# Patient Record
Sex: Female | Born: 1950 | Race: White | Hispanic: No | Marital: Married | Smoking: Never smoker
Health system: Southern US, Community
[De-identification: ages and names within clinical notes are randomized; demographics above are authoritative.]

## PROBLEM LIST (undated history)

## (undated) DIAGNOSIS — C7A8 Other malignant neuroendocrine tumors: Secondary | ICD-10-CM

## (undated) DIAGNOSIS — K521 Toxic gastroenteritis and colitis: Secondary | ICD-10-CM

## (undated) DIAGNOSIS — R112 Nausea with vomiting, unspecified: Secondary | ICD-10-CM

## (undated) DIAGNOSIS — C801 Malignant (primary) neoplasm, unspecified: Secondary | ICD-10-CM

## (undated) DIAGNOSIS — T451X5A Adverse effect of antineoplastic and immunosuppressive drugs, initial encounter: Secondary | ICD-10-CM

## (undated) DIAGNOSIS — F419 Anxiety disorder, unspecified: Secondary | ICD-10-CM

## (undated) DIAGNOSIS — E669 Obesity, unspecified: Secondary | ICD-10-CM

## (undated) DIAGNOSIS — I10 Essential (primary) hypertension: Secondary | ICD-10-CM

## (undated) DIAGNOSIS — D1771 Benign lipomatous neoplasm of kidney: Secondary | ICD-10-CM

## (undated) DIAGNOSIS — C229 Malignant neoplasm of liver, not specified as primary or secondary: Secondary | ICD-10-CM

## (undated) DIAGNOSIS — M2040 Other hammer toe(s) (acquired), unspecified foot: Secondary | ICD-10-CM

## (undated) DIAGNOSIS — M1990 Unspecified osteoarthritis, unspecified site: Secondary | ICD-10-CM

## (undated) HISTORY — DX: Adverse effect of antineoplastic and immunosuppressive drugs, initial encounter: T45.1X5A

## (undated) HISTORY — DX: Benign lipomatous neoplasm of kidney: D17.71

## (undated) HISTORY — DX: Other hammer toe(s) (acquired), unspecified foot: M20.40

## (undated) HISTORY — DX: Obesity, unspecified: E66.9

## (undated) HISTORY — DX: Adverse effect of antineoplastic and immunosuppressive drugs, initial encounter: K52.1

## (undated) HISTORY — DX: Other malignant neuroendocrine tumors: C7A.8

## (undated) HISTORY — PX: OOPHORECTOMY: SHX86

## (undated) HISTORY — DX: Nausea with vomiting, unspecified: R11.2

## (undated) HISTORY — DX: Essential (primary) hypertension: I10

## (undated) HISTORY — PX: BUNIONECTOMY: SHX129

---

## 1993-06-21 HISTORY — PX: ABDOMINAL HYSTERECTOMY: SHX81

## 2006-06-21 HISTORY — PX: ROTATOR CUFF REPAIR: SHX139

## 2006-09-15 ENCOUNTER — Ambulatory Visit: Payer: Self-pay | Admitting: General Practice

## 2006-11-16 ENCOUNTER — Ambulatory Visit: Payer: Self-pay | Admitting: General Practice

## 2006-11-16 ENCOUNTER — Other Ambulatory Visit: Payer: Self-pay

## 2006-11-30 ENCOUNTER — Ambulatory Visit: Payer: Self-pay | Admitting: General Practice

## 2008-12-30 LAB — HM DEXA SCAN

## 2010-02-27 ENCOUNTER — Ambulatory Visit: Payer: Self-pay | Admitting: General Practice

## 2010-03-20 ENCOUNTER — Ambulatory Visit: Payer: Self-pay | Admitting: General Practice

## 2010-05-02 LAB — HM PAP SMEAR: HM Pap smear: NORMAL

## 2010-05-25 ENCOUNTER — Ambulatory Visit: Payer: Self-pay | Admitting: General Practice

## 2010-09-09 LAB — HM MAMMOGRAPHY: HM Mammogram: NORMAL

## 2010-11-23 ENCOUNTER — Ambulatory Visit: Payer: Self-pay | Admitting: Urology

## 2011-05-19 ENCOUNTER — Ambulatory Visit (INDEPENDENT_AMBULATORY_CARE_PROVIDER_SITE_OTHER): Payer: PRIVATE HEALTH INSURANCE | Admitting: Internal Medicine

## 2011-05-19 ENCOUNTER — Encounter: Payer: Self-pay | Admitting: Internal Medicine

## 2011-05-19 DIAGNOSIS — M949 Disorder of cartilage, unspecified: Secondary | ICD-10-CM

## 2011-05-19 DIAGNOSIS — Z79899 Other long term (current) drug therapy: Secondary | ICD-10-CM

## 2011-05-19 DIAGNOSIS — Z1239 Encounter for other screening for malignant neoplasm of breast: Secondary | ICD-10-CM | POA: Insufficient documentation

## 2011-05-19 DIAGNOSIS — E569 Vitamin deficiency, unspecified: Secondary | ICD-10-CM

## 2011-05-19 DIAGNOSIS — I10 Essential (primary) hypertension: Secondary | ICD-10-CM

## 2011-05-19 DIAGNOSIS — D1771 Benign lipomatous neoplasm of kidney: Secondary | ICD-10-CM | POA: Insufficient documentation

## 2011-05-19 DIAGNOSIS — M8580 Other specified disorders of bone density and structure, unspecified site: Secondary | ICD-10-CM

## 2011-05-19 DIAGNOSIS — E669 Obesity, unspecified: Secondary | ICD-10-CM | POA: Insufficient documentation

## 2011-05-19 DIAGNOSIS — M899 Disorder of bone, unspecified: Secondary | ICD-10-CM

## 2011-05-19 DIAGNOSIS — Z1211 Encounter for screening for malignant neoplasm of colon: Secondary | ICD-10-CM

## 2011-05-19 LAB — COMPREHENSIVE METABOLIC PANEL
ALT: 21 U/L (ref 0–35)
Albumin: 4.4 g/dL (ref 3.5–5.2)
CO2: 27 mEq/L (ref 19–32)
Calcium: 9.6 mg/dL (ref 8.4–10.5)
Chloride: 102 mEq/L (ref 96–112)
GFR: 51.03 mL/min — ABNORMAL LOW (ref >60.00)
Potassium: 3.9 mEq/L (ref 3.5–5.1)
Sodium: 139 mEq/L (ref 135–145)
Total Bilirubin: 0.5 mg/dL (ref 0.3–1.2)
Total Protein: 8.4 g/dL — ABNORMAL HIGH (ref 6.0–8.3)

## 2011-05-19 MED ORDER — OLMESARTAN MEDOXOMIL 20 MG PO TABS: 20.0000 mg | ORAL_TABLET | Freq: Every day | ORAL | Status: DC

## 2011-05-20 ENCOUNTER — Encounter: Payer: Self-pay | Admitting: Internal Medicine

## 2011-05-20 DIAGNOSIS — I10 Essential (primary) hypertension: Secondary | ICD-10-CM | POA: Insufficient documentation

## 2011-05-20 LAB — VITAMIN D 25 HYDROXY (VIT D DEFICIENCY, FRACTURES): Vit D, 25-Hydroxy: 56 ng/mL (ref 30–89)

## 2011-05-20 NOTE — Progress Notes (Signed)
  Subjective:    Patient ID: Kathleen James, female    DOB: [birth year], 60 y.o.   MRN: [id number]  HPI    Review of Systems     Objective:   Physical Exam        Assessment & Plan:    Subjective:    Kathleen James is a 60 y.o. female who presents for an annual exam. The patient has no complaints today. The patient is not sexually active. GYN screening history: last pap: approximate date May 20 2010 and was normal. The patient wears seatbelts: yes. The patient participates in regular exercise: no. Has the patient ever been transfused or tattooed?: no. The patient reports that there is not domestic violence in her life.   Menstrual History: OB History    Grav Para Term Preterm Abortions TAB SAB Ect Mult Living                  Menarche age: *26** No LMP recorded. Patient has had a hysterectomy.    The following portions of the patient's history were reviewed and updated as appropriate: allergies, current medications, past family history, past medical history, past social history, past surgical history and problem list.  Review of Systems A comprehensive review of systems was negative.    Objective:    BP 140/82  Pulse 80  Temp(Src) 98.5 F (36.9 C) (Oral)  Resp 16  Ht 5\' 4"  (1.626 m)  Wt 206 lb 4 oz (93.554 kg)  BMI 35.40 kg/m2  SpO2 99% General appearance: alert, cooperative and appears stated age Head: Normocephalic, without obvious abnormality, atraumatic Eyes: conjunctivae/corneas clear. PERRL, EOM's intact. Fundi benign. Ears: normal TM's and external ear canals both ears Neck: no adenopathy, no carotid bruit, no JVD, supple, symmetrical, trachea midline and thyroid not enlarged, symmetric, no tenderness/mass/nodules Back: symmetric, no curvature. ROM normal. No CVA tenderness. Lungs: clear to auscultation bilaterally Breasts: normal appearance, no masses or tenderness Heart: regular rate and rhythm, S1, S2 normal, no murmur, click, rub or  gallop Abdomen: soft, non-tender; bowel sounds normal; no masses,  no organomegaly Extremities: extremities normal, atraumatic, no cyanosis or edema Pulses: 2+ and symmetric Skin: Skin color, texture, turgor normal. No rashes or lesions Lymph nodes: Cervical, supraclavicular, and axillary nodes normal. Neurologic: Alert and oriented X 3, normal strength and tone. Normal symmetric reflexes. Normal coordination and gait.    Assessment:    Healthy female exam.    Plan:     Breast self exam technique reviewed and patient encouraged to perform self-exam monthly. Dietary diary. Discussed healthy lifestyle modifications. Follow up as needed. Sono-mammogram.

## 2011-06-07 ENCOUNTER — Other Ambulatory Visit: Payer: Self-pay | Admitting: Internal Medicine

## 2011-06-07 ENCOUNTER — Other Ambulatory Visit: Payer: PRIVATE HEALTH INSURANCE

## 2011-06-07 ENCOUNTER — Encounter: Payer: Self-pay | Admitting: *Deleted

## 2011-06-07 DIAGNOSIS — Z1211 Encounter for screening for malignant neoplasm of colon: Secondary | ICD-10-CM

## 2011-06-08 ENCOUNTER — Encounter: Payer: Self-pay | Admitting: Internal Medicine

## 2011-06-09 ENCOUNTER — Encounter: Payer: Self-pay | Admitting: *Deleted

## 2011-08-30 LAB — FECAL OCCULT BLOOD, GUAIAC: Fecal Occult Blood: NEGATIVE

## 2011-08-30 LAB — HM MAMMOGRAPHY: HM Mammogram: NORMAL

## 2011-09-01 ENCOUNTER — Encounter: Payer: PRIVATE HEALTH INSURANCE | Admitting: Internal Medicine

## 2011-10-27 ENCOUNTER — Encounter: Payer: Self-pay | Admitting: Internal Medicine

## 2011-10-27 ENCOUNTER — Ambulatory Visit (INDEPENDENT_AMBULATORY_CARE_PROVIDER_SITE_OTHER): Payer: PRIVATE HEALTH INSURANCE | Admitting: Internal Medicine

## 2011-10-27 VITALS — BP 116/78 | HR 75 | Temp 98.5°F | Resp 14 | Ht 64.5 in | Wt 200.2 lb

## 2011-10-27 DIAGNOSIS — I10 Essential (primary) hypertension: Secondary | ICD-10-CM

## 2011-10-27 DIAGNOSIS — F411 Generalized anxiety disorder: Secondary | ICD-10-CM

## 2011-10-27 DIAGNOSIS — Z1239 Encounter for other screening for malignant neoplasm of breast: Secondary | ICD-10-CM

## 2011-10-27 DIAGNOSIS — F489 Nonpsychotic mental disorder, unspecified: Secondary | ICD-10-CM

## 2011-10-27 DIAGNOSIS — F5105 Insomnia due to other mental disorder: Secondary | ICD-10-CM | POA: Insufficient documentation

## 2011-10-27 DIAGNOSIS — D1771 Benign lipomatous neoplasm of kidney: Secondary | ICD-10-CM

## 2011-10-27 DIAGNOSIS — Z1211 Encounter for screening for malignant neoplasm of colon: Secondary | ICD-10-CM

## 2011-10-27 DIAGNOSIS — Z13220 Encounter for screening for lipoid disorders: Secondary | ICD-10-CM

## 2011-10-27 DIAGNOSIS — F419 Anxiety disorder, unspecified: Secondary | ICD-10-CM

## 2011-10-27 DIAGNOSIS — D175 Benign lipomatous neoplasm of intra-abdominal organs: Secondary | ICD-10-CM

## 2011-10-27 MED ORDER — ALPRAZOLAM 0.25 MG PO TABS: 0.2500 mg | ORAL_TABLET | Freq: Every evening | ORAL | Status: AC | PRN

## 2011-10-27 NOTE — Progress Notes (Signed)
Patient ID: Kathleen James, female   DOB: 12/29/1950, 61 y.o.   MRN:    Patient Active Problem List  Diagnoses  . Screening for breast cancer  . Obesity (BMI 30-39.9)  . Angiolipoma of kidney  . Colon cancer screening  . Hypertension  . Insomnia secondary to anxiety    Subjective:  CC:   Chief Complaint  Patient presents with  . Annual Exam    HPI:   Kathleen James a 61 y.o. female who presents  For follow up on hypertension.  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  He has been disabled for months,  Cannot ambulate, and is receiving home IV infusion.  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  Has occasional early morning wakenings, and is unable to return to sleep for hours.     Past Medical History  Diagnosis Date  . Hypertension   . Obesity (BMI 30-39.9)   . Angiolipoma of kidney     followed with serial CTs ,,  Dr. Achilles Dunk    Past Surgical History  Procedure Date  . Abdominal hysterectomy     endometriosis, heavy bleeding  . Oophorectomy   . Rotator cuff repair 2008    right shoulder          The following portions of the patient's history were reviewed and updated as appropriate: Allergies, current medications, and problem list.    Review of Systems:   12 Pt  review of systems was negative except those addressed in the HPI,     History   Social History  . Marital Status: Married    Spouse Name: N/A    Number of Children: N/A  . Years of Education: N/A   Occupational History  . Not on file.   Social History Main Topics  . Smoking status: Never Smoker   . Smokeless tobacco: Never Used  . Alcohol Use: Yes  . Drug Use: No  . Sexually Active: Not Currently   Other Topics Concern  . Not on file   Social History Narrative  . No narrative on file    Objective:  BP 116/78   Pulse 75  Temp(Src) 98.5 F (36.9 C) (Oral)  Resp 14  Ht 5' 4.5" (1.638 m)  Wt 200 lb 4 oz (90.833 kg)  BMI 33.84 kg/m2  SpO2 98%  General appearance: alert, cooperative and appears stated age Ears: normal TM's and external ear canals both ears Throat: lips, mucosa, and tongue normal; teeth and gums normal Neck: no adenopathy, no carotid bruit, supple, symmetrical, trachea midline and thyroid not enlarged, symmetric, no tenderness/mass/nodules Back: symmetric, no curvature. ROM normal. No CVA tenderness. Lungs: clear to auscultation bilaterally Heart: regular rate and rhythm, S1, S2 normal, no murmur, click, rub or gallop Abdomen: soft, non-tender; bowel sounds normal; no masses,  no organomegaly Pulses: 2+ and symmetric Skin: Skin color, texture, turgor normal. No rashes or lesions Lymph nodes: Cervical, supraclavicular, and axillary nodes normal.  Assessment and Plan:  Insomnia secondary to anxiety Trial of low dose alprazolam  discussed with patient given that her insomnia is due to generalize anxiety over her husband's medical condition. She has been given a prescription for 0.25 mg to use for early awakening as needed. If she finds that she is needing to use this on a daily basis we discussed switching her  to an SSRI to avoid dependence on sedatives for management of anxiety-induced insomnia.  Hypertension Well controlled on current medications.  No changes today. BMET due.  She will return for fasting labs as well,    Screening for breast cancer She had a normal mammogram in March of 2012 at [facility]. Repeat has been scheduled. She had a normal breast exam last visit.  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 Shane Crutch off. There has been no change.  Colon cancer screening At her last visit she was given fecal occult blood test cards to take home and do in lieu of screening colonoscopy. These have not been returned  yet. She was reminded to do these.    Updated Medication List Outpatient Encounter Prescriptions as of 10/27/2011  Medication Sig Dispense Refill  . Calcium Carbonate-Vitamin D (CALCIUM + D) 600-200 MG-UNIT TABS Take by mouth.        Marland Kitchen glucosamine-chondroitin 500-400 MG tablet Take 1 tablet by mouth 3 (three) times daily.        . Multiple Vitamin (MULTIVITAMIN) tablet Take 1 tablet by mouth daily.        Marland Kitchen olmesartan (BENICAR) 20 MG tablet Take 1 tablet (20 mg total) by mouth daily.  90 tablet  3  . ALPRAZolam (XANAX) 0.25 MG tablet Take 1 tablet (0.25 mg total) by mouth at bedtime as needed for sleep.  30 tablet  2     Orders Placed This Encounter  Procedures  . HM MAMMOGRAPHY  . HM DEXA SCAN  . Lipid panel  . COMPLETE METABOLIC PANEL WITH GFR  . HM PAP SMEAR    No Follow-up on file.

## 2011-10-27 NOTE — Assessment & Plan Note (Signed)
Well controlled on current medications.  No changes today. BMET due.  She will return for fasting labs as well,

## 2011-10-27 NOTE — Assessment & Plan Note (Addendum)
Trial of low dose alprazolam  discussed with patient given that her insomnia is due to generalize anxiety over her husband's medical condition. She has been given a prescription for 0.25 mg to use for early awakening as needed. If she finds that she is needing to use this on a daily basis we discussed switching her to an SSRI to avoid dependence on sedatives for management of anxiety-induced insomnia.

## 2011-10-31 NOTE — Assessment & Plan Note (Signed)
At her last visit she was given fecal occult blood test cards to take home and do in lieu of screening colonoscopy. These have not been returned yet. She was reminded to do these.

## 2011-10-31 NOTE — Assessment & Plan Note (Signed)
Her angioma lipoma is being followed with serial ultrasounds by Dr. Shane Crutch off. There has been no change.

## 2011-10-31 NOTE — Assessment & Plan Note (Signed)
She had a normal mammogram in March of 2012 at [facility]. Repeat has been scheduled. She had a normal breast exam last visit.

## 2011-11-02 ENCOUNTER — Other Ambulatory Visit (INDEPENDENT_AMBULATORY_CARE_PROVIDER_SITE_OTHER): Payer: PRIVATE HEALTH INSURANCE | Admitting: *Deleted

## 2011-11-02 DIAGNOSIS — I10 Essential (primary) hypertension: Secondary | ICD-10-CM

## 2011-11-02 DIAGNOSIS — Z13220 Encounter for screening for lipoid disorders: Secondary | ICD-10-CM

## 2011-11-02 LAB — COMPREHENSIVE METABOLIC PANEL
Albumin: 3.9 g/dL (ref 3.5–5.2)
Alkaline Phosphatase: 64 U/L (ref 39–117)
BUN: 17 mg/dL (ref 6–23)
CO2: 25 mEq/L (ref 19–32)
GFR: 57.87 mL/min — ABNORMAL LOW (ref >60.00)
Glucose, Bld: 85 mg/dL (ref 70–99)
Potassium: 4.2 mEq/L (ref 3.5–5.1)
Sodium: 141 mEq/L (ref 135–145)
Total Protein: 7.9 g/dL (ref 6.0–8.3)

## 2011-11-02 LAB — LIPID PANEL: Cholesterol: 149 mg/dL (ref 0–200)

## 2011-12-08 ENCOUNTER — Ambulatory Visit: Payer: Self-pay | Admitting: Urology

## 2012-06-06 ENCOUNTER — Other Ambulatory Visit: Payer: Self-pay

## 2012-06-06 DIAGNOSIS — I10 Essential (primary) hypertension: Secondary | ICD-10-CM

## 2012-06-06 MED ORDER — OLMESARTAN MEDOXOMIL 20 MG PO TABS: 20.0000 mg | ORAL_TABLET | Freq: Every day | ORAL | Status: DC

## 2012-06-06 NOTE — Telephone Encounter (Signed)
Refill request for Benicar 20 mg # 90 0 R  Sent to [facility] . Advised patient needs appt for further refills.

## 2012-09-20 ENCOUNTER — Ambulatory Visit: Payer: PRIVATE HEALTH INSURANCE | Admitting: Internal Medicine

## 2012-09-29 ENCOUNTER — Ambulatory Visit (INDEPENDENT_AMBULATORY_CARE_PROVIDER_SITE_OTHER): Payer: PRIVATE HEALTH INSURANCE | Admitting: Internal Medicine

## 2012-09-29 ENCOUNTER — Encounter: Payer: Self-pay | Admitting: Internal Medicine

## 2012-09-29 VITALS — BP 142/82 | HR 87 | Temp 98.0°F | Resp 16 | Wt 200.8 lb

## 2012-09-29 DIAGNOSIS — Z1239 Encounter for other screening for malignant neoplasm of breast: Secondary | ICD-10-CM

## 2012-09-29 DIAGNOSIS — R5381 Other malaise: Secondary | ICD-10-CM

## 2012-09-29 DIAGNOSIS — J309 Allergic rhinitis, unspecified: Secondary | ICD-10-CM

## 2012-09-29 DIAGNOSIS — Z1211 Encounter for screening for malignant neoplasm of colon: Secondary | ICD-10-CM

## 2012-09-29 DIAGNOSIS — Z79899 Other long term (current) drug therapy: Secondary | ICD-10-CM

## 2012-09-29 DIAGNOSIS — I10 Essential (primary) hypertension: Secondary | ICD-10-CM

## 2012-09-29 DIAGNOSIS — E785 Hyperlipidemia, unspecified: Secondary | ICD-10-CM

## 2012-09-29 MED ORDER — LEVOFLOXACIN 500 MG PO TABS: 500.0000 mg | ORAL_TABLET | Freq: Every day | ORAL | Status: DC

## 2012-09-29 NOTE — Patient Instructions (Addendum)
Try taking Allegra, claritin or zyrtec  For allergies  Add simply saline flush twice daily to flush the pollen  Try sudafed pe  Up to 30 mg every 6 hours and benadryl if drainage not controlled with allegra, etc/   Start abx only if fever (T > 100.4) ,  Facial pain,  Or bloody nasal drainage  Check bp once a week after you finish using a decongestant (sudafed ) to see if < 130/80  Fasting labs day of annual PE

## 2012-09-29 NOTE — Progress Notes (Signed)
Patient ID: Kathleen James, female   DOB: Feb 05, 1951, 62 y.o.   MRN: [id number]  Patient Active Problem List  Diagnosis  . Screening for breast cancer  . Obesity (BMI 30-39.9)  . Angiolipoma of kidney  . Colon cancer screening  . Hypertension  . Insomnia secondary to anxiety  . Allergic rhinitis    Subjective:  CC:   Chief Complaint  Patient presents with  . Follow-up    for script refills     HPI:   Kathleen James a 62 y.o. female who presents for follow up on hypertension.  She has been lost to follow up and refills on medications were denied until she was reevaluated.  Her cc today is persistent allergic rhinitis. Her sinuses have felt congested and accompanied by a  frontal headache for two days which has now resolved,  Her sinus drainage has been colored, yellow to green, but she denies fevers and sinus tendernes.    Past Medical History  Diagnosis Date  . Hypertension   . Obesity (BMI 30-39.9)   . Angiolipoma of kidney     followed with serial CTs ,,  Dr. Achilles Dunk    Past Surgical History  Procedure Laterality Date  . Abdominal hysterectomy      endometriosis, heavy bleeding  . Oophorectomy    . Rotator cuff repair  2008    right shoulder     The following portions of the patient's history were reviewed and updated as appropriate: Allergies, current medications, and problem list.    Review of Systems:   Patient denies headache, fevers, malaise, unintentional weight loss, skin rash, eye pain, sinus congestion and sinus pain, sore throat, dysphagia,  hemoptysis , cough, dyspnea, wheezing, chest pain, palpitations, orthopnea, edema, abdominal pain, nausea, melena, diarrhea, constipation, flank pain, dysuria, hematuria, urinary  Frequency, nocturia, numbness, tingling, seizures,  Focal weakness, Loss of consciousness,  Tremor, insomnia, depression, anxiety, and suicidal ideation.       History   Social History  . Marital Status: Married    Spouse Name: N/A     Number of Children: N/A  . Years of Education: N/A   Occupational History  . Not on file.   Social History Main Topics  . Smoking status: Never Smoker   . Smokeless tobacco: Never Used  . Alcohol Use: Yes  . Drug Use: No  . Sexually Active: Not Currently   Other Topics Concern  . Not on file   Social History Narrative  . No narrative on file    Objective:  BP 142/82  Pulse 87  Temp(Src) 98 F (36.7 C) (Oral)  Resp 16  Wt 200 lb 12 oz (91.06 kg)  BMI 33.94 kg/m2  SpO2 98%  General appearance: alert, cooperative and appears stated age Ears: normal TM's and external ear canals both ears Throat: lips, mucosa, and tongue normal; teeth and gums normal Neck: no adenopathy, no carotid bruit, supple, symmetrical, trachea midline and thyroid not enlarged, symmetric, no tenderness/mass/nodules Back: symmetric, no curvature. ROM normal. No CVA tenderness. Lungs: clear to auscultation bilaterally Heart: regular rate and rhythm, S1, S2 normal, no murmur, click, rub or gallop Abdomen: soft, non-tender; bowel sounds normal; no masses,  no organomegaly Pulses: 2+ and symmetric Skin: Skin color, texture, turgor normal. No rashes or lesions Lymph nodes: Cervical, supraclavicular, and axillary nodes normal.  Assessment and Plan:  Screening for breast cancer She is due for repeat mammogram and this has been ordered.  Return for annual exam.  Hypertension Well controlled  on current regimen. Renal function due, no changes today.  Colon cancer screening Per patient preference,  Annual FOBTS.    Updated Medication List Outpatient Encounter Prescriptions as of 09/29/2012  Medication Sig Dispense Refill  . aspirin 81 MG tablet Take 81 mg by mouth daily.      . Calcium Carbonate-Vitamin D (CALCIUM + D) 600-200 MG-UNIT TABS Take by mouth.        . fish oil-omega-3 fatty acids 1000 MG capsule Take 600 mg by mouth daily.      Marland Kitchen glucosamine-chondroitin 500-400 MG tablet Take 1 tablet  by mouth 3 (three) times daily.        Marland Kitchen olmesartan (BENICAR) 20 MG tablet Take 1 tablet (20 mg total) by mouth daily.  90 tablet  0  . levofloxacin (LEVAQUIN) 500 MG tablet Take 1 tablet (500 mg total) by mouth daily.  7 tablet  0  . Multiple Vitamin (MULTIVITAMIN) tablet Take 1 tablet by mouth daily.         No facility-administered encounter medications on file as of 09/29/2012.     Orders Placed This Encounter  Procedures  . HM MAMMOGRAPHY  . MM Digital Screening  . Fecal Occult Blood, Guaiac  . CBC with Differential  . Comprehensive metabolic panel  . Lipid panel  . TSH  . POC Hemoccult Bld/Stl (3-Cd Home Screen)    No Follow-up on file.

## 2012-09-30 ENCOUNTER — Encounter: Payer: Self-pay | Admitting: Internal Medicine

## 2012-09-30 DIAGNOSIS — J309 Allergic rhinitis, unspecified: Secondary | ICD-10-CM | POA: Insufficient documentation

## 2012-09-30 NOTE — Assessment & Plan Note (Signed)
Well controlled on current regimen. Renal function due.,   no changes today. 

## 2012-09-30 NOTE — Assessment & Plan Note (Signed)
She is due for repeat mammogram and this has been ordered.  Return for annual exam.

## 2012-09-30 NOTE — Assessment & Plan Note (Signed)
Per patient preference,  Annual FOBTS.

## 2012-10-04 ENCOUNTER — Other Ambulatory Visit: Payer: PRIVATE HEALTH INSURANCE

## 2012-10-05 ENCOUNTER — Other Ambulatory Visit: Payer: Self-pay | Admitting: Internal Medicine

## 2012-10-05 ENCOUNTER — Other Ambulatory Visit: Payer: PRIVATE HEALTH INSURANCE

## 2012-10-05 DIAGNOSIS — Z1211 Encounter for screening for malignant neoplasm of colon: Secondary | ICD-10-CM

## 2012-10-09 ENCOUNTER — Encounter: Payer: Self-pay | Admitting: *Deleted

## 2012-10-10 ENCOUNTER — Telehealth: Payer: Self-pay | Admitting: *Deleted

## 2012-10-10 MED ORDER — ALPRAZOLAM 0.25 MG PO TABS: 0.2500 mg | ORAL_TABLET | Freq: Every day | ORAL | Status: DC | PRN

## 2012-10-10 NOTE — Telephone Encounter (Signed)
Ok to refill,  Authorized in epic 

## 2012-10-10 NOTE — Telephone Encounter (Signed)
Refill request  Alprazolam 0.25 mg tab  #30  Take one tablet by mouth at bedtime as needed

## 2012-10-11 NOTE — Telephone Encounter (Signed)
Called to pharmacy 

## 2012-11-24 ENCOUNTER — Encounter: Payer: Self-pay | Admitting: Internal Medicine

## 2012-12-07 ENCOUNTER — Other Ambulatory Visit: Payer: Self-pay | Admitting: *Deleted

## 2012-12-07 DIAGNOSIS — I10 Essential (primary) hypertension: Secondary | ICD-10-CM

## 2012-12-07 MED ORDER — OLMESARTAN MEDOXOMIL 20 MG PO TABS: 20.0000 mg | ORAL_TABLET | Freq: Every day | ORAL | Status: DC

## 2013-01-10 ENCOUNTER — Encounter: Payer: Self-pay | Admitting: Internal Medicine

## 2013-01-10 ENCOUNTER — Ambulatory Visit (INDEPENDENT_AMBULATORY_CARE_PROVIDER_SITE_OTHER): Payer: PRIVATE HEALTH INSURANCE | Admitting: Internal Medicine

## 2013-01-10 VITALS — BP 130/90 | HR 79 | Temp 98.4°F | Resp 14 | Ht 65.0 in | Wt 205.5 lb

## 2013-01-10 DIAGNOSIS — R5381 Other malaise: Secondary | ICD-10-CM

## 2013-01-10 DIAGNOSIS — F419 Anxiety disorder, unspecified: Secondary | ICD-10-CM

## 2013-01-10 DIAGNOSIS — E669 Obesity, unspecified: Secondary | ICD-10-CM

## 2013-01-10 DIAGNOSIS — Z79899 Other long term (current) drug therapy: Secondary | ICD-10-CM

## 2013-01-10 DIAGNOSIS — Z1211 Encounter for screening for malignant neoplasm of colon: Secondary | ICD-10-CM

## 2013-01-10 DIAGNOSIS — Z0000 Encounter for general adult medical examination without abnormal findings: Secondary | ICD-10-CM

## 2013-01-10 DIAGNOSIS — F5105 Insomnia due to other mental disorder: Secondary | ICD-10-CM

## 2013-01-10 DIAGNOSIS — I10 Essential (primary) hypertension: Secondary | ICD-10-CM

## 2013-01-10 DIAGNOSIS — Z23 Encounter for immunization: Secondary | ICD-10-CM

## 2013-01-10 DIAGNOSIS — Z1239 Encounter for other screening for malignant neoplasm of breast: Secondary | ICD-10-CM

## 2013-01-10 DIAGNOSIS — Z13220 Encounter for screening for lipoid disorders: Secondary | ICD-10-CM

## 2013-01-10 DIAGNOSIS — D175 Benign lipomatous neoplasm of intra-abdominal organs: Secondary | ICD-10-CM

## 2013-01-10 DIAGNOSIS — R5383 Other fatigue: Secondary | ICD-10-CM

## 2013-01-10 DIAGNOSIS — F411 Generalized anxiety disorder: Secondary | ICD-10-CM

## 2013-01-10 DIAGNOSIS — D1771 Benign lipomatous neoplasm of kidney: Secondary | ICD-10-CM

## 2013-01-10 DIAGNOSIS — F489 Nonpsychotic mental disorder, unspecified: Secondary | ICD-10-CM

## 2013-01-10 LAB — CBC WITH DIFFERENTIAL/PLATELET
Basophils Relative: 0.5 % (ref 0.0–3.0)
Eosinophils Relative: 3.1 % (ref 0.0–5.0)
MCV: 97.4 fl (ref 78.0–100.0)
Monocytes Absolute: 0.2 10*3/uL (ref 0.1–1.0)
Monocytes Relative: 5 % (ref 3.0–12.0)
Neutrophils Relative %: 61.4 % (ref 43.0–77.0)
RBC: 4.02 Mil/uL (ref 3.87–5.11)
WBC: 4.6 10*3/uL (ref 4.5–10.5)

## 2013-01-10 LAB — COMPREHENSIVE METABOLIC PANEL
Albumin: 4.2 g/dL (ref 3.5–5.2)
Alkaline Phosphatase: 56 U/L (ref 39–117)
BUN: 20 mg/dL (ref 6–23)
Glucose, Bld: 96 mg/dL (ref 70–99)
Total Bilirubin: 0.5 mg/dL (ref 0.3–1.2)

## 2013-01-10 LAB — LIPID PANEL
HDL: 61 mg/dL (ref >39.00)
LDL Cholesterol: 85 mg/dL (ref 0–99)
Total CHOL/HDL Ratio: 3
Triglycerides: 105 mg/dL (ref 0.0–149.0)
VLDL: 21 mg/dL (ref 0.0–40.0)

## 2013-01-10 NOTE — Assessment & Plan Note (Addendum)
She has had serial Ultrasounds which have been normal now for 3 years. Her last evaluation was last month and she follows up with Dr. cope.,

## 2013-01-10 NOTE — Patient Instructions (Addendum)
You had your annual wellness exam today  You received  a TDaP vaccine and  I will give you the rx for the Shingles vaccine.    We will contact you with the bloodwork results

## 2013-01-10 NOTE — Progress Notes (Signed)
Patient ID: Kathleen James, female   DOB: 05-06-1951, 62 y.o.   MRN: [id number]  Subjective:     Kathleen James is a 62 y.o. female and is here for a comprehensive physical exam. The patient reports no problems.  History   Social History  . Marital Status: Married    Spouse Name: N/A    Number of Children: N/A  . Years of Education: N/A   Occupational History  . Not on file.   Social History Main Topics  . Smoking status: Never Smoker   . Smokeless tobacco: Never Used  . Alcohol Use: Yes  . Drug Use: No  . Sexually Active: Not Currently   Other Topics Concern  . Not on file   Social History Narrative  . No narrative on file   Health Maintenance  Topic Date Due  . Colonoscopy  08/29/2000  . Zostavax  08/30/2010  . Influenza Vaccine  02/19/2013  . Pap Smear  05/20/2013  . Mammogram  11/11/2014  . Tetanus/tdap  01/11/2023    The following portions of the patient's history were reviewed and updated as appropriate: allergies, current medications, past family history, past medical history, past social history, past surgical history and problem list.  Review of Systems A comprehensive review of systems was negative.   Objective:   BP 130/90  Pulse 79  Temp(Src) 98.4 F (36.9 C) (Oral)  Resp 14  Ht 5\' 5"  (1.651 m)  Wt 205 lb 8 oz (93.214 kg)  BMI 34.2 kg/m2  SpO2 98%  General Appearance:    Alert, cooperative, no distress, appears stated age  Head:    Normocephalic, without obvious abnormality, atraumatic  Eyes:    PERRL, conjunctiva/corneas clear, EOM's intact, fundi    benign, both eyes  Ears:    Normal TM's and external ear canals, both ears  Nose:   Nares normal, septum midline, mucosa normal, no drainage    or sinus tenderness  Throat:   Lips, mucosa, and tongue normal; teeth and gums normal  Neck:   Supple, symmetrical, trachea midline, no adenopathy;    thyroid:  no enlargement/tenderness/nodules; no carotid   bruit or JVD  Back:     Symmetric, no  curvature, ROM normal, no CVA tenderness  Lungs:     Clear to auscultation bilaterally, respirations unlabored  Chest Wall:    No tenderness or deformity   Heart:    Regular rate and rhythm, S1 and S2 normal, no murmur, rub   or gallop  Breast Exam:    No tenderness, masses, or nipple abnormality  Abdomen:     Soft, non-tender, bowel sounds active all four quadrants,    no masses, no organomegaly  Genitalia:    Pelvic: vagina normal without discharge  Extremities:   Extremities normal, atraumatic, no cyanosis or edema  Pulses:   2+ and symmetric all extremities  Skin:   Skin color, texture, turgor normal, no rashes or lesions  Lymph nodes:   Cervical, supraclavicular, and axillary nodes normal  Neurologic:   CNII-XII intact, normal strength, sensation and reflexes    throughout    Assessment and Plan:  Angiolipoma of kidney She has had serial Ultrasounds which have been normal now for 3 years. Her last evaluation was last month and she follows up with Dr. cope.,    Colon cancer screening She prefers to continue annual fecal occult blood testing rather than repeat your colonoscopy. This was normal in April.  Screening for breast cancer She is up-to-date on  mammograms and have been normal.  Obesity (BMI 30-39.9) I have addressed  BMI and recommended a low glycemic index diet utilizing smaller more frequent meals to increase metabolism.  I have also recommended that patient start exercising with a goal of 30 minutes of aerobic exercise a minimum of 5 days per week. Screening for lipid disorders, thyroid and diabetes today and there were no signs of metabolic syndrome.    Hypertension Well controlled on current regimen. Renal function stable, no changes today.  Insomnia secondary to anxiety She has been having early awakening at 3:00 in the morning. Trial of very low-dose alprazolam to help her go back to sleep.  Routine general medical examination at a health care facility Annual  comprehensive exam was done including breast, pelvic exams.  All screenings have been addressed .    Updated Medication List Outpatient Encounter Prescriptions as of 01/10/2013  Medication Sig Dispense Refill  . ALPRAZolam (XANAX) 0.25 MG tablet Take 1 tablet (0.25 mg total) by mouth daily as needed for sleep or anxiety.  30 tablet  3  . aspirin 81 MG tablet Take 81 mg by mouth daily.      . Calcium Carbonate-Vitamin D (CALCIUM + D) 600-200 MG-UNIT TABS Take by mouth.        . fish oil-omega-3 fatty acids 1000 MG capsule Take 600 mg by mouth daily.      Marland Kitchen glucosamine-chondroitin 500-400 MG tablet Take 1 tablet by mouth 3 (three) times daily.        . Multiple Vitamin (MULTIVITAMIN) tablet Take 1 tablet by mouth daily.        Marland Kitchen olmesartan (BENICAR) 20 MG tablet Take 1 tablet (20 mg total) by mouth daily.  90 tablet  1  . [DISCONTINUED] levofloxacin (LEVAQUIN) 500 MG tablet Take 1 tablet (500 mg total) by mouth daily.  7 tablet  0   No facility-administered encounter medications on file as of 01/10/2013.

## 2013-01-11 ENCOUNTER — Encounter: Payer: Self-pay | Admitting: Internal Medicine

## 2013-01-11 DIAGNOSIS — Z0000 Encounter for general adult medical examination without abnormal findings: Secondary | ICD-10-CM | POA: Insufficient documentation

## 2013-01-11 NOTE — Assessment & Plan Note (Signed)
She has been having early awakening at 3:00 in the morning. Trial of very low-dose alprazolam to help her go back to sleep.

## 2013-01-11 NOTE — Assessment & Plan Note (Signed)
She is up-to-date on mammograms and have been normal.

## 2013-01-11 NOTE — Assessment & Plan Note (Addendum)
I have addressed  BMI and recommended a low glycemic index diet utilizing smaller more frequent meals to increase metabolism.  I have also recommended that patient start exercising with a goal of 30 minutes of aerobic exercise a minimum of 5 days per week. Screening for lipid disorders, thyroid and diabetes today and there were no signs of metabolic syndrome.

## 2013-01-11 NOTE — Assessment & Plan Note (Signed)
Annual comprehensive exam was done including breast, pelvic exams.  All screenings have been addressed .

## 2013-01-11 NOTE — Assessment & Plan Note (Signed)
She prefers to continue annual fecal occult blood testing rather than repeat your colonoscopy. This was normal in April.

## 2013-01-11 NOTE — Assessment & Plan Note (Signed)
Well controlled on current regimen. Renal function stable, no changes today. 

## 2013-01-14 ENCOUNTER — Encounter: Payer: Self-pay | Admitting: Internal Medicine

## 2013-01-29 ENCOUNTER — Encounter: Payer: Self-pay | Admitting: Internal Medicine

## 2013-04-26 ENCOUNTER — Other Ambulatory Visit: Payer: Self-pay

## 2013-06-06 ENCOUNTER — Other Ambulatory Visit: Payer: Self-pay | Admitting: Internal Medicine

## 2013-12-13 ENCOUNTER — Other Ambulatory Visit: Payer: Self-pay | Admitting: Internal Medicine

## 2014-02-19 ENCOUNTER — Other Ambulatory Visit: Payer: Self-pay | Admitting: Internal Medicine

## 2014-02-19 NOTE — Telephone Encounter (Signed)
Appt scheduled 03/25/14

## 2014-03-25 ENCOUNTER — Encounter: Payer: PRIVATE HEALTH INSURANCE | Admitting: Internal Medicine

## 2014-04-09 ENCOUNTER — Encounter: Payer: PRIVATE HEALTH INSURANCE | Admitting: Internal Medicine

## 2014-04-22 ENCOUNTER — Other Ambulatory Visit: Payer: Self-pay

## 2014-04-22 DIAGNOSIS — E785 Hyperlipidemia, unspecified: Secondary | ICD-10-CM

## 2014-04-22 DIAGNOSIS — E559 Vitamin D deficiency, unspecified: Secondary | ICD-10-CM

## 2014-04-22 DIAGNOSIS — R5383 Other fatigue: Secondary | ICD-10-CM

## 2014-04-22 NOTE — Telephone Encounter (Signed)
Appt sch 06/07/14. Last visit July 2014. Last labs July 2014. Ok refill and you want any labs prior to?

## 2014-04-22 NOTE — Telephone Encounter (Signed)
The pt called and is needing refills on her blood pressure meds (benicar)  [place]

## 2014-04-24 MED ORDER — OLMESARTAN MEDOXOMIL 20 MG PO TABS: 20.0000 mg | ORAL_TABLET | Freq: Every day | ORAL | Status: DC

## 2014-04-24 NOTE — Telephone Encounter (Signed)
Left message, notifying pt. Requested call back to schedule fasting lab appointment.

## 2014-04-24 NOTE — Telephone Encounter (Signed)
Ok to Refill for 30 days and must have fasting labs done  ASAP  prior to any more refill

## 2014-05-02 ENCOUNTER — Other Ambulatory Visit (INDEPENDENT_AMBULATORY_CARE_PROVIDER_SITE_OTHER): Payer: PRIVATE HEALTH INSURANCE

## 2014-05-02 DIAGNOSIS — R5383 Other fatigue: Secondary | ICD-10-CM

## 2014-05-02 DIAGNOSIS — E785 Hyperlipidemia, unspecified: Secondary | ICD-10-CM

## 2014-05-02 DIAGNOSIS — E559 Vitamin D deficiency, unspecified: Secondary | ICD-10-CM

## 2014-05-02 LAB — LIPID PANEL
CHOL/HDL RATIO: 4
CHOLESTEROL: 179 mg/dL (ref 0–200)
HDL: 48.6 mg/dL (ref >39.00)
LDL CALC: 112 mg/dL — AB (ref 0–99)
NonHDL: 130.4
TRIGLYCERIDES: 93 mg/dL (ref 0.0–149.0)
VLDL: 18.6 mg/dL (ref 0.0–40.0)

## 2014-05-02 LAB — COMPREHENSIVE METABOLIC PANEL
ALBUMIN: 3.5 g/dL (ref 3.5–5.2)
ALT: 23 U/L (ref 0–35)
AST: 23 U/L (ref 0–37)
Alkaline Phosphatase: 71 U/L (ref 39–117)
BUN: 14 mg/dL (ref 6–23)
CALCIUM: 9.5 mg/dL (ref 8.4–10.5)
CO2: 21 mEq/L (ref 19–32)
CREATININE: 1.1 mg/dL (ref 0.4–1.2)
Chloride: 108 mEq/L (ref 96–112)
GFR: 56.14 mL/min — ABNORMAL LOW (ref >60.00)
GLUCOSE: 100 mg/dL — AB (ref 70–99)
POTASSIUM: 4.3 meq/L (ref 3.5–5.1)
Sodium: 140 mEq/L (ref 135–145)
Total Bilirubin: 0.7 mg/dL (ref 0.2–1.2)
Total Protein: 8 g/dL (ref 6.0–8.3)

## 2014-05-02 LAB — CBC WITH DIFFERENTIAL/PLATELET
BASOS PCT: 0.6 % (ref 0.0–3.0)
Basophils Absolute: 0 10*3/uL (ref 0.0–0.1)
EOS PCT: 1.9 % (ref 0.0–5.0)
Eosinophils Absolute: 0.1 10*3/uL (ref 0.0–0.7)
HCT: 40.2 % (ref 36.0–46.0)
HEMOGLOBIN: 13.7 g/dL (ref 12.0–15.0)
LYMPHS PCT: 22.4 % (ref 12.0–46.0)
Lymphs Abs: 1.3 10*3/uL (ref 0.7–4.0)
MCHC: 34 g/dL (ref 30.0–36.0)
MCV: 96.8 fl (ref 78.0–100.0)
Monocytes Absolute: 0.3 10*3/uL (ref 0.1–1.0)
Monocytes Relative: 5.4 % (ref 3.0–12.0)
NEUTROS ABS: 4.1 10*3/uL (ref 1.4–7.7)
Neutrophils Relative %: 69.7 % (ref 43.0–77.0)
Platelets: 328 10*3/uL (ref 150.0–400.0)
RBC: 4.16 Mil/uL (ref 3.87–5.11)
RDW: 13.4 % (ref 11.5–15.5)
WBC: 5.9 10*3/uL (ref 4.0–10.5)

## 2014-05-02 LAB — TSH: TSH: 2.16 u[IU]/mL (ref 0.35–4.50)

## 2014-05-02 LAB — VITAMIN D 25 HYDROXY (VIT D DEFICIENCY, FRACTURES): VITD: 36.6 ng/mL (ref 30.00–100.00)

## 2014-05-05 ENCOUNTER — Encounter: Payer: Self-pay | Admitting: Internal Medicine

## 2014-05-21 ENCOUNTER — Encounter: Payer: PRIVATE HEALTH INSURANCE | Admitting: Internal Medicine

## 2014-06-07 ENCOUNTER — Encounter: Payer: Self-pay | Admitting: Internal Medicine

## 2014-06-07 ENCOUNTER — Ambulatory Visit (INDEPENDENT_AMBULATORY_CARE_PROVIDER_SITE_OTHER): Payer: PRIVATE HEALTH INSURANCE | Admitting: Internal Medicine

## 2014-06-07 VITALS — BP 124/88 | HR 77 | Temp 98.4°F | Resp 14 | Ht 64.5 in | Wt 204.2 lb

## 2014-06-07 DIAGNOSIS — E669 Obesity, unspecified: Secondary | ICD-10-CM

## 2014-06-07 DIAGNOSIS — D1771 Benign lipomatous neoplasm of kidney: Secondary | ICD-10-CM

## 2014-06-07 DIAGNOSIS — Z1239 Encounter for other screening for malignant neoplasm of breast: Secondary | ICD-10-CM

## 2014-06-07 DIAGNOSIS — D175 Benign lipomatous neoplasm of intra-abdominal organs: Secondary | ICD-10-CM

## 2014-06-07 DIAGNOSIS — I10 Essential (primary) hypertension: Secondary | ICD-10-CM

## 2014-06-07 DIAGNOSIS — Z0000 Encounter for general adult medical examination without abnormal findings: Secondary | ICD-10-CM

## 2014-06-07 LAB — MICROALBUMIN / CREATININE URINE RATIO
CREATININE, U: 46 mg/dL
MICROALB/CREAT RATIO: 0.4 mg/g (ref 0.0–30.0)
Microalb, Ur: 0.2 mg/dL (ref 0.0–1.9)

## 2014-06-07 NOTE — Progress Notes (Signed)
Pre visit review using our clinic review tool, if applicable. No additional management support is needed unless otherwise documented below in the visit note. 

## 2014-06-07 NOTE — Patient Instructions (Signed)
I want you to try to  lose 20 lbs over the next six months with a low glycemic index diet and regular exercise (30 minutes of cardio 5 days per week is your goal)  This is  my version of a  "Low GI"  Diet:  It will still lower your blood sugars and allow you to lose 4 to 8  lbs  per month if you follow it carefully.  Your goal with exercise is a minimum of 30 minutes of aerobic exercise 5 days per week (Walking does not count once it becomes easy!)     All of the foods can be found at grocery stores and in bulk at [employer].  The Atkins protein bars and shakes are available in more varieties at [employer], [employer] and [place].     7 AM Breakfast:  Choose from the following:  Low carbohydrate Protein  Shakes (I recommend the EAS AdvantEdge "Carb Control" shakes  Or the low carb shakes by Atkins.    2.5 carbs   Arnold's "Sandwhich Thin"toasted  w/ peanut butter (no jelly: about 20 net carbs  "Bagel Thin" with cream cheese and salmon: about 20 carbs   a scrambled egg/bacon/cheese burrito made with Mission's "carb balance" whole wheat tortilla  (about 10 net carbs )  A slice of home made fritatta (egg based dish without a crust:  google it)    Avoid cereal and bananas, oatmeal and cream of wheat and grits. They are loaded with carbohydrates!   10 AM: high protein snack  Protein bar by Atkins (the snack size, under 200 cal, usually < 6 net carbs).    A stick of cheese:  Around 1 carb,  100 cal     Dannon Light n Fit [place] Yogurt  (80 cal, 8 carbs)  Other so called "protein bars" and Greek yogurts tend to be loaded with carbohydrates.  Remember, in food advertising, the word "energy" is synonymous for " carbohydrate."  Lunch:   A Sandwich using the bread choices listed, Can use any  Eggs,  lunchmeat, grilled meat or canned tuna), avocado, regular mayo/mustard  and cheese.  A Salad using blue cheese, ranch,  Goddess or vinagrette,  No croutons or "confetti" and no "candied nuts" but regular  nuts OK.   No pretzels or chips.  Pickles and miniature sweet peppers are a good low carb alternative that provide a "crunch"  The bread is the only source of carbohydrate in a sandwich and  can be decreased by trying some of these alternatives to traditional loaf bread  Joseph's makes a pita bread and a flat bread that are 50 cal and 4 net carbs available at [place] [employer] [place].  This can be toasted to use with hummous as well  Toufayan makes a low carb flatbread that's 100 cal and 9 net carbs available at [employer] and [employer]J's makes 2 sizes of  Low carb whole wheat tortilla  (The large one is 210 cal and 6 net carbs)  Flat Out makes flatbreads that are low carb as well  Avoid "Low fat dressings, as well as Barry Brunner and [place] dressings They are loaded with sugar!   3 PM/ Mid day  Snack:  Consider  1 ounce of  almonds, walnuts, pistachios, pecans, peanuts,  Macadamia nuts or a nut medley.  Avoid "granola"; the dried cranberries and raisins are loaded with carbohydrates. Mixed nuts as long as there are no raisins,  cranberries or  dried fruit.    Try the prosciutto/mozzarella cheese sticks by Fiorruci  In deli /backery section   High protein   To avoid overindulging in snacks: Try drinking a glass of unsweeted almond/coconut milk  Or a cup of coffee with your Atkins chocolate bar to keep you from having 3!!!   Pork rinds!  Yes Pork Rinds        6 PM  Dinner:     Meat/fowl/fish with a green salad, and either broccoli, cauliflower, green beans, spinach, brussel sprouts or  Lima beans. DO NOT BREAD THE PROTEIN!!      There is a low carb pasta by Dreamfield's that is acceptable and tastes great: only 5 digestible carbs/serving.( All grocery stores but BJs carry it )  Try Hurley Cisco Angelo's chicken piccata or chicken or eggplant parm over low carb pasta.(Lowes and BJs)   Marjory Lies Sanchez's "Carnitas" (pulled pork, no sauce,  0 carbs) or his beef pot roast to make a dinner burrito (at  BJ's)  Pesto over low carb pasta (bj's sells a good quality pesto in the center refrigerated section of the deli   Try satueeing  Cheral Marker with mushroooms  Whole wheat pasta is still full of digestible carbs and  Not as low in glycemic index as Dreamfield's.   Brown rice is still rice,  So skip the rice and noodles if you eat [place] or [place] (or at least limit to 1/2 cup)  9 PM snack :   Breyer's "low carb" fudgsicle or  ice cream bar (Carb Smart line), or  Weight Watcher's ice cream bar , or another "no sugar added" ice cream;  a serving of fresh berries/cherries with whipped cream   Cheese or DANNON'S LlGHT N FIT GREEK YOGURT or the [place] greek yogurt   8 ounces of [place] unsweetened almond/cococunut milk  Cheese and crackers (using WASA crackers,  They are low carb) or peanut butter on low carb crackers or pita bread     Avoid bananas, pineapple, grapes  and watermelon on a regular basis because they are high in sugar.  THINK OF THEM AS DESSERT  Remember that snack Substitutions should be less than 10 NET carbs per serving and meals should be < 25 net carbs. Remember that carbohydrates from fiber do not affect blood sugar, so you can  subtract fiber grams to get the "net carbs " of any particular food item.   Health Maintenance Adopting a healthy lifestyle and getting preventive care can go a long way to promote health and wellness. Talk with your health care provider about what schedule of regular examinations is right for you. This is a good chance for you to check in with your provider about disease prevention and staying healthy. In between checkups, there are plenty of things you can do on your own. Experts have done a lot of research about which lifestyle changes and preventive measures are most likely to keep you healthy. Ask your health care provider for more information. WEIGHT AND DIET  Eat a healthy diet  Be sure to include plenty of vegetables, fruits, low-fat dairy  products, and lean protein.  Do not eat a lot of foods high in solid fats, added sugars, or salt.  Get regular exercise. This is one of the most important things you can do for your health.  Most adults should exercise for at least 150 minutes each week. The exercise should increase your heart rate and make you sweat (moderate-intensity exercise).  Most  adults should also do strengthening exercises at least twice a week. This is in addition to the moderate-intensity exercise.  Maintain a healthy weight  Body mass index (BMI) is a measurement that can be used to identify possible weight problems. It estimates body fat based on height and weight. Your health care provider can help determine your BMI and help you achieve or maintain a healthy weight.  For females 95 years of age and older:   A BMI below 18.5 is considered underweight.  A BMI of 18.5 to 24.9 is normal.  A BMI of 25 to 29.9 is considered overweight.  A BMI of 30 and above is considered obese.  Watch levels of cholesterol and blood lipids  You should start having your blood tested for lipids and cholesterol at 63 years of age, then have this test every 5 years.  You may need to have your cholesterol levels checked more often if:  Your lipid or cholesterol levels are high.  You are older than 63 years of age.  You are at high risk for heart disease.  CANCER SCREENING   Lung Cancer  Lung cancer screening is recommended for adults 63-82 years old who are at high risk for lung cancer because of a history of smoking.  A yearly low-dose CT scan of the lungs is recommended for people who:  Currently smoke.  Have quit within the past 15 years.  Have at least a 30-pack-year history of smoking. A pack year is smoking an average of one pack of cigarettes a day for 1 year.  Yearly screening should continue until it has been 15 years since you quit.  Yearly screening should stop if you develop a health problem that  would prevent you from having lung cancer treatment.  Breast Cancer  Practice breast self-awareness. This means understanding how your breasts normally appear and feel.  It also means doing regular breast self-exams. Let your health care provider know about any changes, no matter how small.  If you are in your 20s or 30s, you should have a clinical breast exam (CBE) by a health care provider every 1-3 years as part of a regular health exam.  If you are 48 or older, have a CBE every year. Also consider having a breast X-ray (mammogram) every year.  If you have a family history of breast cancer, talk to your health care provider about genetic screening.  If you are at high risk for breast cancer, talk to your health care provider about having an MRI and a mammogram every year.  Breast cancer gene (BRCA) assessment is recommended for women who have family members with BRCA-related cancers. BRCA-related cancers include:  Breast.  Ovarian.  Tubal.  Peritoneal cancers.  Results of the assessment will determine the need for genetic counseling and BRCA1 and BRCA2 testing. Cervical Cancer Routine pelvic examinations to screen for cervical cancer are no longer recommended for nonpregnant women who are considered low risk for cancer of the pelvic organs (ovaries, uterus, and vagina) and who do not have symptoms. A pelvic examination may be necessary if you have symptoms including those associated with pelvic infections. Ask your health care provider if a screening pelvic exam is right for you.   The Pap test is the screening test for cervical cancer for women who are considered at risk.  If you had a hysterectomy for a problem that was not cancer or a condition that could lead to cancer, then you no longer need Pap tests.  If you are older than 65 years, and you have had normal Pap tests for the past 10 years, you no longer need to have Pap tests.  If you have had past treatment for cervical  cancer or a condition that could lead to cancer, you need Pap tests and screening for cancer for at least 20 years after your treatment.  If you no longer get a Pap test, assess your risk factors if they change (such as having a new sexual partner). This can affect whether you should start being screened again.  Some women have medical problems that increase their chance of getting cervical cancer. If this is the case for you, your health care provider may recommend more frequent screening and Pap tests.  The human papillomavirus (HPV) test is another test that may be used for cervical cancer screening. The HPV test looks for the virus that can cause cell changes in the cervix. The cells collected during the Pap test can be tested for HPV.  The HPV test can be used to screen women 75 years of age and older. Getting tested for HPV can extend the interval between normal Pap tests from three to five years.  An HPV test also should be used to screen women of any age who have unclear Pap test results.  After 63 years of age, women should have HPV testing as often as Pap tests.  Colorectal Cancer  This type of cancer can be detected and often prevented.  Routine colorectal cancer screening usually begins at 63 years of age and continues through 63 years of age.  Your health care provider may recommend screening at an earlier age if you have risk factors for colon cancer.  Your health care provider may also recommend using home test kits to check for hidden blood in the stool.  A small camera at the end of a tube can be used to examine your colon directly (sigmoidoscopy or colonoscopy). This is done to check for the earliest forms of colorectal cancer.  Routine screening usually begins at age 40.  Direct examination of the colon should be repeated every 5-10 years through 63 years of age. However, you may need to be screened more often if early forms of precancerous polyps or small growths are  found. Skin Cancer  Check your skin from head to toe regularly.  Tell your health care provider about any new moles or changes in moles, especially if there is a change in a mole's shape or color.  Also tell your health care provider if you have a mole that is larger than the size of a pencil eraser.  Always use sunscreen. Apply sunscreen liberally and repeatedly throughout the day.  Protect yourself by wearing long sleeves, pants, a wide-brimmed hat, and sunglasses whenever you are outside. HEART DISEASE, DIABETES, AND HIGH BLOOD PRESSURE   Have your blood pressure checked at least every 1-2 years. High blood pressure causes heart disease and increases the risk of stroke.  If you are between 31 years and 65 years old, ask your health care provider if you should take aspirin to prevent strokes.  Have regular diabetes screenings. This involves taking a blood sample to check your fasting blood sugar level.  If you are at a normal weight and have a low risk for diabetes, have this test once every three years after 63 years of age.  If you are overweight and have a high risk for diabetes, consider being tested at a younger age  or more often. PREVENTING INFECTION  Hepatitis B  If you have a higher risk for hepatitis B, you should be screened for this virus. You are considered at high risk for hepatitis B if:  You were born in a country where hepatitis B is common. Ask your health care provider which countries are considered high risk.  Your parents were born in a high-risk country, and you have not been immunized against hepatitis B (hepatitis B vaccine).  You have HIV or AIDS.  You use needles to inject street drugs.  You live with someone who has hepatitis B.  You have had sex with someone who has hepatitis B.  You get hemodialysis treatment.  You take certain medicines for conditions, including cancer, organ transplantation, and autoimmune conditions. Hepatitis C  Blood  testing is recommended for:  Everyone born from [birth year] through 1965.  Anyone with known risk factors for hepatitis C. Sexually transmitted infections (STIs)  You should be screened for sexually transmitted infections (STIs) including gonorrhea and chlamydia if:  You are sexually active and are younger than 63 years of age.  You are older than 63 years of age and your health care provider tells you that you are at risk for this type of infection.  Your sexual activity has changed since you were last screened and you are at an increased risk for chlamydia or gonorrhea. Ask your health care provider if you are at risk.  If you do not have HIV, but are at risk, it may be recommended that you take a prescription medicine daily to prevent HIV infection. This is called pre-exposure prophylaxis (PrEP). You are considered at risk if:  You are sexually active and do not regularly use condoms or know the HIV status of your partner(s).  You take drugs by injection.  You are sexually active with a partner who has HIV. Talk with your health care provider about whether you are at high risk of being infected with HIV. If you choose to begin PrEP, you should first be tested for HIV. You should then be tested every 3 months for as long as you are taking PrEP.  PREGNANCY   If you are premenopausal and you may become pregnant, ask your health care provider about preconception counseling.  If you may become pregnant, take 400 to 800 micrograms (mcg) of folic acid every day.  If you want to prevent pregnancy, talk to your health care provider about birth control (contraception). OSTEOPOROSIS AND MENOPAUSE   Osteoporosis is a disease in which the bones lose minerals and strength with aging. This can result in serious bone fractures. Your risk for osteoporosis can be identified using a bone density scan.  If you are 40 years of age or older, or if you are at risk for osteoporosis and fractures, ask your  health care provider if you should be screened.  Ask your health care provider whether you should take a calcium or vitamin D supplement to lower your risk for osteoporosis.  Menopause may have certain physical symptoms and risks.  Hormone replacement therapy may reduce some of these symptoms and risks. Talk to your health care provider about whether hormone replacement therapy is right for you.  HOME CARE INSTRUCTIONS   Schedule regular health, dental, and eye exams.  Stay current with your immunizations.   Do not use any tobacco products including cigarettes, chewing tobacco, or electronic cigarettes.  If you are pregnant, do not drink alcohol.  If you are breastfeeding, limit how  much and how often you drink alcohol.  Limit alcohol intake to no more than 1 drink per day for nonpregnant women. One drink equals 12 ounces of beer, 5 ounces of wine, or 1 ounces of hard liquor.  Do not use street drugs.  Do not share needles.  Ask your health care provider for help if you need support or information about quitting drugs.  Tell your health care provider if you often feel depressed.  Tell your health care provider if you have ever been abused or do not feel safe at home. Document Released: 12/21/2010 Document Revised: 10/22/2013 Document Reviewed: 05/09/2013 [facility] Patient Information 2015 [place], [place]. This information is not intended to replace advice given to you by your health care provider. Make sure you discuss any questions you have with your health care provider.

## 2014-06-07 NOTE — Progress Notes (Signed)
Patient ID: Kathleen James, female   DOB: 05-Sep-1950, 63 y.o.   MRN: [id number]  Subjective:     TEALE GOODGAME is a 63 y.o. female and is here for a comprehensive physical exam. The patient reports weight gain .Marland Kitchen Annual exam last one July 2015 and follow up on hypertension and obesity   Has not been exercising due to husband's prologed illness,  Planning to resume participation in the  [employer] weight loss program.   She is S/p hysterectomy     History   Social History  . Marital Status: Married    Spouse Name: N/A    Number of Children: N/A  . Years of Education: N/A   Occupational History  . Not on file.   Social History Main Topics  . Smoking status: Never Smoker   . Smokeless tobacco: Never Used  . Alcohol Use: Yes  . Drug Use: No  . Sexual Activity: Not Currently   Other Topics Concern  . Not on file   Social History Narrative   Health Maintenance  Topic Date Due  . COLONOSCOPY  08/29/2000  . ZOSTAVAX  08/30/2010  . PAP SMEAR  05/20/2013  . INFLUENZA VACCINE  09/19/2014 (Originally 01/19/2014)  . MAMMOGRAM  11/11/2014  . TETANUS/TDAP  01/11/2023    The following portions of the patient's history were reviewed and updated as appropriate: allergies, current medications, past family history, past medical history, past social history, past surgical history and problem list.  Review of Systems A comprehensive review of systems was negative.   Objective:   BP 124/88 mmHg  Pulse 77  Temp(Src) 98.4 F (36.9 C) (Oral)  Resp 14  Ht 5' 4.5" (1.638 m)  Wt 204 lb 4 oz (92.647 kg)  BMI 34.53 kg/m2  SpO2 98%  General appearance: alert, cooperative and appears stated age Head: Normocephalic, without obvious abnormality, atraumatic Eyes: conjunctivae/corneas clear. PERRL, EOM's intact. Fundi benign. Ears: normal TM's and external ear canals both ears Nose: Nares normal. Septum midline. Mucosa normal. No drainage or sinus tenderness. Throat: lips, mucosa,  and tongue normal; teeth and gums normal Neck: no adenopathy, no carotid bruit, no JVD, supple, symmetrical, trachea midline and thyroid not enlarged, symmetric, no tenderness/mass/nodules Lungs: clear to auscultation bilaterally Breasts: normal appearance, no masses or tenderness Heart: regular rate and rhythm, S1, S2 normal, no murmur, click, rub or gallop Abdomen: soft, non-tender; bowel sounds normal; no masses,  no organomegaly Extremities: extremities normal, atraumatic, no cyanosis or edema Pulses: 2+ and symmetric Skin: Skin color, texture, turgor normal. No rashes or lesions Neurologic: Alert and oriented X 3, normal strength and tone. Normal symmetric reflexes. Normal coordination and gait.    Assessment and Plan:    Problem List Items Addressed This Visit      Cardiovascular and Mediastinum   Hypertension   Relevant Orders      Microalbumin / creatinine urine ratio (Completed)     Other   Angiolipoma of kidney    She has had serial Ultrasounds which have been normal now for 3 years. Her next evaluation with Dr. Jacqlyn Larsen. Is next month .         Obesity (BMI 30-39.9)    I have addressed  BMI and recommended wt loss of 10% of body weigh over the next 6 months using a low glycemic index diet and regular exercise a minimum of 5 days per week.      Visit for preventive health examination    Annual wellness  exam was done as well as a comprehensive physical exam and management of acute and chronic conditions .  During the course of the visit the patient was educated and counseled about appropriate screening and preventive services including :  diabetes screening, lipid analysis with projected  10 year  risk for CAD , nutrition counseling, colorectal cancer screening, and recommended immunizations.  Printed recommendations for health maintenance screenings was given.       Other Visit Diagnoses    Breast cancer screening    -  Primary    Relevant Orders       MM DIGITAL  SCREENING BILATERAL

## 2014-06-09 ENCOUNTER — Encounter: Payer: Self-pay | Admitting: Internal Medicine

## 2014-06-09 NOTE — Assessment & Plan Note (Signed)
I have addressed  BMI and recommended wt loss of 10% of body weigh over the next 6 months using a low glycemic index diet and regular exercise a minimum of 5 days per week.   

## 2014-06-09 NOTE — Assessment & Plan Note (Signed)
She has had serial Ultrasounds which have been normal now for 3 years. Her next evaluation with Dr. Jacqlyn Larsen. Is next month .

## 2014-06-09 NOTE — Assessment & Plan Note (Signed)

## 2014-06-10 ENCOUNTER — Other Ambulatory Visit: Payer: Self-pay | Admitting: Internal Medicine

## 2014-06-10 ENCOUNTER — Telehealth: Payer: Self-pay | Admitting: Internal Medicine

## 2014-06-10 NOTE — Telephone Encounter (Signed)
emmi emailed °

## 2014-07-11 LAB — HM MAMMOGRAPHY

## 2015-01-01 ENCOUNTER — Other Ambulatory Visit: Payer: Self-pay | Admitting: Internal Medicine

## 2015-01-29 ENCOUNTER — Other Ambulatory Visit: Payer: Self-pay | Admitting: Internal Medicine

## 2015-03-04 ENCOUNTER — Other Ambulatory Visit: Payer: Self-pay | Admitting: Internal Medicine

## 2015-05-05 ENCOUNTER — Other Ambulatory Visit: Payer: Self-pay | Admitting: Internal Medicine

## 2015-06-02 ENCOUNTER — Other Ambulatory Visit: Payer: Self-pay | Admitting: Internal Medicine

## 2015-06-02 ENCOUNTER — Other Ambulatory Visit: Payer: Self-pay

## 2015-06-02 MED ORDER — OLMESARTAN MEDOXOMIL 20 MG PO TABS: 20.0000 mg | ORAL_TABLET | Freq: Every day | ORAL | Status: DC

## 2015-06-30 ENCOUNTER — Encounter: Payer: Self-pay | Admitting: Internal Medicine

## 2015-07-01 ENCOUNTER — Encounter: Payer: PRIVATE HEALTH INSURANCE | Admitting: Internal Medicine

## 2015-08-05 ENCOUNTER — Ambulatory Visit (INDEPENDENT_AMBULATORY_CARE_PROVIDER_SITE_OTHER): Payer: 59 | Admitting: Internal Medicine

## 2015-08-05 ENCOUNTER — Encounter: Payer: Self-pay | Admitting: Internal Medicine

## 2015-08-05 VITALS — BP 138/100 | HR 83 | Temp 98.1°F | Ht 64.5 in | Wt 202.5 lb

## 2015-08-05 DIAGNOSIS — Z0000 Encounter for general adult medical examination without abnormal findings: Secondary | ICD-10-CM

## 2015-08-05 DIAGNOSIS — Z13220 Encounter for screening for lipoid disorders: Secondary | ICD-10-CM | POA: Diagnosis not present

## 2015-08-05 DIAGNOSIS — M8580 Other specified disorders of bone density and structure, unspecified site: Secondary | ICD-10-CM | POA: Diagnosis not present

## 2015-08-05 DIAGNOSIS — Z90721 Acquired absence of ovaries, unilateral: Secondary | ICD-10-CM

## 2015-08-05 DIAGNOSIS — I10 Essential (primary) hypertension: Secondary | ICD-10-CM

## 2015-08-05 DIAGNOSIS — Z90710 Acquired absence of both cervix and uterus: Secondary | ICD-10-CM

## 2015-08-05 MED ORDER — OLMESARTAN MEDOXOMIL 20 MG PO TABS: 40.0000 mg | ORAL_TABLET | Freq: Every day | ORAL | Status: DC

## 2015-08-05 MED ORDER — ALPRAZOLAM 0.25 MG PO TABS: 0.2500 mg | ORAL_TABLET | Freq: Every day | ORAL | Status: DC | PRN

## 2015-08-05 NOTE — Patient Instructions (Signed)
I have increased your olmesartan to 40 mg daily,  Please take the higher dose if your home BPs are consistently > 130/80  We will send the cologuard screening test request to your insurance  Mammogram will be ordered You need 1200 mg calcium and 1000 IUs of Vit D daily  (unless you are deficient by the labs I have ordered)/  I recommend getting the majority of your calcium and Vitamin D  through diet rather than supplements given the recent association of calcium supplements with increased coronary artery calcium scores  Try the almond, soy  and cashew milks that most grocery stores  now carry  in the dairy  Section>   They are lactose and cholesterol free.  Menopause is a normal process in which your reproductive ability comes to an end. This process happens gradually over a span of months to years, usually between the ages of 65 and 52. Menopause is complete when you have missed 12 consecutive menstrual periods. It is important to talk with your health care provider about some of the most common conditions that affect postmenopausal women, such as heart disease, cancer, and bone loss (osteoporosis). Adopting a healthy lifestyle and getting preventive care can help to promote your health and wellness. Those actions can also lower your chances of developing some of these common conditions. WHAT SHOULD I KNOW ABOUT MENOPAUSE? During menopause, you may experience a number of symptoms, such as:  Moderate-to-severe hot flashes.  Night sweats.  Decrease in sex drive.  Mood swings.  Headaches.  Tiredness.  Irritability.  Memory problems.  Insomnia. Choosing to treat or not to treat menopausal changes is an individual decision that you make with your health care provider. WHAT SHOULD I KNOW ABOUT HORMONE REPLACEMENT THERAPY AND SUPPLEMENTS? Hormone therapy products are effective for treating symptoms that are associated with menopause, such as hot flashes and night sweats. Hormone  replacement carries certain risks, especially as you become older. If you are thinking about using estrogen or estrogen with progestin treatments, discuss the benefits and risks with your health care provider. WHAT SHOULD I KNOW ABOUT HEART DISEASE AND STROKE? Heart disease, heart attack, and stroke become more likely as you age. This may be due, in part, to the hormonal changes that your body experiences during menopause. These can affect how your body processes dietary fats, triglycerides, and cholesterol. Heart attack and stroke are both medical emergencies. There are many things that you can do to help prevent heart disease and stroke:  Have your blood pressure checked at least every 1-2 years. High blood pressure causes heart disease and increases the risk of stroke.  If you are 65-91 years old, ask your health care provider if you should take aspirin to prevent a heart attack or a stroke.  Do not use any tobacco products, including cigarettes, chewing tobacco, or electronic cigarettes. If you need help quitting, ask your health care provider.  It is important to eat a healthy diet and maintain a healthy weight.  Be sure to include plenty of vegetables, fruits, low-fat dairy products, and lean protein.  Avoid eating foods that are high in solid fats, added sugars, or salt (sodium).  Get regular exercise. This is one of the most important things that you can do for your health.  Try to exercise for at least 150 minutes each week. The type of exercise that you do should increase your heart rate and make you sweat. This is known as moderate-intensity exercise.  Try to do  strengthening exercises at least twice each week. Do these in addition to the moderate-intensity exercise.  Know your numbers.Ask your health care provider to check your cholesterol and your blood glucose. Continue to have your blood tested as directed by your health care provider. WHAT SHOULD I KNOW ABOUT CANCER  SCREENING? There are several types of cancer. Take the following steps to reduce your risk and to catch any cancer development as early as possible. Breast Cancer  Practice breast self-awareness.  This means understanding how your breasts normally appear and feel.  It also means doing regular breast self-exams. Let your health care provider know about any changes, no matter how small.  If you are 65 or older, have a clinician do a breast exam (clinical breast exam or CBE) every year. Depending on your age, family history, and medical history, it may be recommended that you also have a yearly breast X-ray (mammogram).  If you have a family history of breast cancer, talk with your health care provider about genetic screening.  If you are at high risk for breast cancer, talk with your health care provider about having an MRI and a mammogram every year.  Breast cancer (BRCA) gene test is recommended for women who have family members with BRCA-related cancers. Results of the assessment will determine the need for genetic counseling and BRCA1 and for BRCA2 testing. BRCA-related cancers include these types:  Breast. This occurs in males or females.  Ovarian.  Tubal. This may also be called fallopian tube cancer.  Cancer of the abdominal or pelvic lining (peritoneal cancer).  Prostate.  Pancreatic. Cervical, Uterine, and Ovarian Cancer Your health care provider may recommend that you be screened regularly for cancer of the pelvic organs. These include your ovaries, uterus, and vagina. This screening involves a pelvic exam, which includes checking for microscopic changes to the surface of your cervix (Pap test).  For women ages 21-65, health care providers may recommend a pelvic exam and a Pap test every three years. For women ages 59-65, they may recommend the Pap test and pelvic exam, combined with testing for human papilloma virus (HPV), every five years. Some types of HPV increase your  risk of cervical cancer. Testing for HPV may also be done on women of any age who have unclear Pap test results.  Other health care providers may not recommend any screening for nonpregnant women who are considered low risk for pelvic cancer and have no symptoms. Ask your health care provider if a screening pelvic exam is right for you.  If you have had past treatment for cervical cancer or a condition that could lead to cancer, you need Pap tests and screening for cancer for at least 20 years after your treatment. If Pap tests have been discontinued for you, your risk factors (such as having a new sexual partner) need to be reassessed to determine if you should start having screenings again. Some women have medical problems that increase the chance of getting cervical cancer. In these cases, your health care provider may recommend that you have screening and Pap tests more often.  If you have a family history of uterine cancer or ovarian cancer, talk with your health care provider about genetic screening.  If you have vaginal bleeding after reaching menopause, tell your health care provider.  There are currently no reliable tests available to screen for ovarian cancer. Lung Cancer Lung cancer screening is recommended for adults 27-10 years old who are at high risk for lung cancer  because of a history of smoking. A yearly low-dose CT scan of the lungs is recommended if you:  Currently smoke.  Have a history of at least 30 pack-years of smoking and you currently smoke or have quit within the past 15 years. A pack-year is smoking an average of one pack of cigarettes per day for one year. Yearly screening should:  Continue until it has been 15 years since you quit.  Stop if you develop a health problem that would prevent you from having lung cancer treatment. Colorectal Cancer  This type of cancer can be detected and can often be prevented.  Routine colorectal cancer screening usually begins  at age 52 and continues through age 48.  If you have risk factors for colon cancer, your health care provider may recommend that you be screened at an earlier age.  If you have a family history of colorectal cancer, talk with your health care provider about genetic screening.  Your health care provider may also recommend using home test kits to check for hidden blood in your stool.  A small camera at the end of a tube can be used to examine your colon directly (sigmoidoscopy or colonoscopy). This is done to check for the earliest forms of colorectal cancer.  Direct examination of the colon should be repeated every 5-10 years until age 64. However, if early forms of precancerous polyps or small growths are found or if you have a family history or genetic risk for colorectal cancer, you may need to be screened more often. Skin Cancer  Check your skin from head to toe regularly.  Monitor any moles. Be sure to tell your health care provider:  About any new moles or changes in moles, especially if there is a change in a mole's shape or color.  If you have a mole that is larger than the size of a pencil eraser.  If any of your family members has a history of skin cancer, especially at a young age, talk with your health care provider about genetic screening.  Always use sunscreen. Apply sunscreen liberally and repeatedly throughout the day.  Whenever you are outside, protect yourself by wearing long sleeves, pants, a wide-brimmed hat, and sunglasses. WHAT SHOULD I KNOW ABOUT OSTEOPOROSIS? Osteoporosis is a condition in which bone destruction happens more quickly than new bone creation. After menopause, you may be at an increased risk for osteoporosis. To help prevent osteoporosis or the bone fractures that can happen because of osteoporosis, the following is recommended:  If you are 5-5 years old, get at least 1,000 mg of calcium and at least 600 mg of vitamin D per day.  If you are older  than age 65 but younger than age 36, get at least 1,200 mg of calcium and at least 600 mg of vitamin D per day.  If you are older than age 64, get at least 1,200 mg of calcium and at least 800 mg of vitamin D per day. Smoking and excessive alcohol intake increase the risk of osteoporosis. Eat foods that are rich in calcium and vitamin D, and do weight-bearing exercises several times each week as directed by your health care provider. WHAT SHOULD I KNOW ABOUT HOW MENOPAUSE AFFECTS [place]? Depression may occur at any age, but it is more common as you become older. Common symptoms of depression include:  Low or sad mood.  Changes in sleep patterns.  Changes in appetite or eating patterns.  Feeling an overall lack of  motivation or enjoyment of activities that you previously enjoyed.  Frequent crying spells. Talk with your health care provider if you think that you are experiencing depression. WHAT SHOULD I KNOW ABOUT IMMUNIZATIONS? It is important that you get and maintain your immunizations. These include:  Tetanus, diphtheria, and pertussis (Tdap) booster vaccine.  Influenza every year before the flu season begins.  Pneumonia vaccine.  Shingles vaccine. Your health care provider may also recommend other immunizations.   This information is not intended to replace advice given to you by your health care provider. Make sure you discuss any questions you have with your health care provider.   Document Released: 07/30/2005 Document Revised: 06/28/2014 Document Reviewed: 02/07/2014 Elsevier Interactive Patient Education [employer].

## 2015-08-05 NOTE — Progress Notes (Signed)
Pre visit review using our clinic review tool, if applicable. No additional management support is needed unless otherwise documented below in the visit note. 

## 2015-08-05 NOTE — Progress Notes (Signed)
Patient ID: Kathleen James, female    DOB: 07-15-1950  Age: 65 y.o. MRN: [id number]  The patient is here for annual wellness examination and management of other chronic and acute problems.  Last seen in 2015  S/p TAH/BSO Mammogram due FOBT negative 2014 BMI 34 No labs since 2014     The risk factors are reflected in the social history.  The roster of all physicians providing medical care to patient - is listed in the Snapshot section of the chart.  Activities of daily living:  The patient is 100% independent in all ADLs: dressing, toileting, feeding as well as independent mobility  Home safety : The patient has smoke detectors in the home. They wear seatbelts.  There are no firearms at home. There is no violence in the home.   There is no risks for hepatitis, STDs or HIV. There is no   history of blood transfusion. They have no travel history to infectious disease endemic areas of the world.  The patient has seen their dentist in the last six month. They have seen their eye doctor in the last year. They admit to slight hearing difficulty with regard to whispered voices and some television programs.  They have deferred audiologic testing in the last year.  They do not  have excessive sun exposure. Discussed the need for sun protection: hats, long sleeves and use of sunscreen if there is significant sun exposure.   Diet: the importance of a healthy diet is discussed. They do have a healthy diet.  The benefits of regular aerobic exercise were discussed. She walks 4 times per week ,  20 minutes.   Depression screen: there are no signs or vegative symptoms of depression- irritability, change in appetite, anhedonia, sadness/tearfullness.  Cognitive assessment: the patient manages all their financial and personal affairs and is actively engaged. They could relate day,date,year and events; recalled 2/3 objects at 3 minutes; performed clock-face test normally.  The following portions of the  patient's history were reviewed and updated as appropriate: allergies, current medications, past family history, past medical history,  past surgical history, past social history  and problem list.  Visual acuity was not assessed per patient preference since she has regular follow up with her ophthalmologist. Hearing and body mass index were assessed and reviewed.   During the course of the visit the patient was educated and counseled about appropriate screening and preventive services including : fall prevention , diabetes screening, nutrition counseling, colorectal cancer screening, and recommended immunizations.    CC: The primary encounter diagnosis was S/P hysterectomy with oophorectomy. Diagnoses of Osteopenia, Screening for hyperlipidemia, Visit for preventive health examination, and Essential hypertension were also pertinent to this visit.  Lots of financial stressors,  Due to business issues .  Has episdesof insomnia  History Kathleen James has a past medical history of Hypertension; Obesity (BMI 30-39.9); and Angiolipoma of kidney.   She has past surgical history that includes Abdominal hysterectomy; Oophorectomy; and Rotator cuff repair (Right, 2008).   Her family history includes COPD in her father; Cancer (age of onset: 59) in her maternal aunt; Heart disease in her sister; Hypertension in her father; Mental illness (age of onset: 6) in her mother; Stroke (age of onset: 66) in her father.She reports that she has never smoked. She has never used smokeless tobacco. She reports that she drinks alcohol. She reports that she does not use illicit drugs.  Outpatient Prescriptions Prior to Visit  Medication Sig Dispense Refill  . ALPRAZolam Duanne Moron)  0.25 MG tablet Take 1 tablet (0.25 mg total) by mouth daily as needed for sleep or anxiety. 30 tablet 3  . olmesartan (BENICAR) 20 MG tablet Take 1 tablet (20 mg total) by mouth daily. 30 tablet 1  . aspirin 81 MG tablet Take 81 mg by mouth daily.  Reported on 08/05/2015    . Multiple Vitamin (MULTIVITAMIN) tablet Take 1 tablet by mouth daily. Reported on 08/05/2015     No facility-administered medications prior to visit.    Review of Systems  Patient denies headache, fevers, malaise, unintentional weight loss, skin rash, eye pain, sinus congestion and sinus pain, sore throat, dysphagia,  hemoptysis , cough, dyspnea, wheezing, chest pain, palpitations, orthopnea, edema, abdominal pain, nausea, melena, diarrhea, constipation, flank pain, dysuria, hematuria, urinary  Frequency, nocturia, numbness, tingling, seizures,  Focal weakness, Loss of consciousness,  Tremor, insomnia, depression, anxiety, and suicidal ideation.     Objective:  BP 138/100 mmHg  Pulse 83  Temp(Src) 98.1 F (36.7 C) (Oral)  Ht 5' 4.5" (1.638 m)  Wt 202 lb 8 oz (91.853 kg)  BMI 34.23 kg/m2  SpO2 99%  Physical Exam    General appearance: alert, cooperative and appears stated age Head: Normocephalic, without obvious abnormality, atraumatic Eyes: conjunctivae/corneas clear. PERRL, EOM's intact. Fundi benign. Ears: normal TM's and external ear canals both ears Nose: Nares normal. Septum midline. Mucosa normal. No drainage or sinus tenderness. Throat: lips, mucosa, and tongue normal; teeth and gums normal Neck: no adenopathy, no carotid bruit, no JVD, supple, symmetrical, trachea midline and thyroid not enlarged, symmetric, no tenderness/mass/nodules Lungs: clear to auscultation bilaterally Breasts: normal appearance, no masses or tenderness Heart: regular rate and rhythm, S1, S2 normal, no murmur, click, rub or gallop Abdomen: soft, non-tender; bowel sounds normal; no masses,  no organomegaly Extremities: extremities normal, atraumatic, no cyanosis or edema Pulses: 2+ and symmetric Skin: Skin color, texture, turgor normal. No rashes or lesions Neurologic: Alert and oriented X 3, normal strength and tone. Normal symmetric reflexes. Normal coordination and gait.      Assessment & Plan:   Problem List Items Addressed This Visit    Hypertension    Not Well controlled on current regimen. Increasing dose to 40 mg daily. Renal function due, no changes today.  Lab Results  Component Value Date   CREATININE 1.1 05/02/2014   Lab Results  Component Value Date   NA 140 05/02/2014   K 4.3 05/02/2014   CL 108 05/02/2014   CO2 21 05/02/2014         Relevant Medications   olmesartan (BENICAR) 20 MG tablet   Visit for preventive health examination    Annual comprehensive preventive exam was done as well as an evaluation and management of chronic conditions .  During the course of the visit the patient was educated and counseled about appropriate screening and preventive services including :  diabetes screening, lipid analysis with projected  10 year  risk for CAD , nutrition counseling, breast, cervical and colorectal cancer screening, and recommended immunizations.  Printed recommendations for health maintenance screenings was give      S/P hysterectomy with oophorectomy - Primary   Osteopenia    Other Visit Diagnoses    Screening for hyperlipidemia           I have changed Ms. Fotheringham's olmesartan. I am also having her maintain her multivitamin, aspirin, MULTI-VITAMINS, and ALPRAZolam.  Meds ordered this encounter  Medications  . Multiple Vitamin (MULTI-VITAMINS) TABS    Sig: Take 1,000  mg by mouth daily. Reported on 08/05/2015  . ALPRAZolam (XANAX) 0.25 MG tablet    Sig: Take 1 tablet (0.25 mg total) by mouth daily as needed for sleep or anxiety.    Dispense:  45 tablet    Refill:  5  . olmesartan (BENICAR) 20 MG tablet    Sig: Take 2 tablets (40 mg total) by mouth daily.    Dispense:  180 tablet    Refill:  1    Medications Discontinued During This Encounter  Medication Reason  . ALPRAZolam (XANAX) 0.25 MG tablet Reorder  . olmesartan (BENICAR) 20 MG tablet Reorder    Follow-up: No Follow-up on file.   [name],  MD

## 2015-08-06 NOTE — Assessment & Plan Note (Signed)
Annual comprehensive preventive exam was done as well as an evaluation and management of chronic conditions .  During the course of the visit the patient was educated and counseled about appropriate screening and preventive services including :  diabetes screening, lipid analysis with projected  10 year  risk for CAD , nutrition counseling, breast, cervical and colorectal cancer screening, and recommended immunizations.  Printed recommendations for health maintenance screenings was give 

## 2015-08-06 NOTE — Assessment & Plan Note (Signed)
Not Well controlled on current regimen. Increasing dose to 40 mg daily. Renal function due, no changes today.  Lab Results  Component Value Date   CREATININE 1.1 05/02/2014   Lab Results  Component Value Date   NA 140 05/02/2014   K 4.3 05/02/2014   CL 108 05/02/2014   CO2 21 05/02/2014

## 2015-08-07 ENCOUNTER — Encounter: Payer: Self-pay | Admitting: Internal Medicine

## 2015-08-07 MED ORDER — OLMESARTAN MEDOXOMIL 20 MG PO TABS: 40.0000 mg | ORAL_TABLET | Freq: Every day | ORAL | Status: DC

## 2015-08-13 ENCOUNTER — Other Ambulatory Visit: Payer: Self-pay

## 2015-08-20 ENCOUNTER — Other Ambulatory Visit: Payer: Self-pay

## 2015-08-20 DIAGNOSIS — Z299 Encounter for prophylactic measures, unspecified: Secondary | ICD-10-CM

## 2015-08-20 NOTE — Progress Notes (Signed)
Patient came in to have blood drawn per Dr Lupita Dawn order.  Blood was drawn from the right arm without any incident.

## 2015-08-21 LAB — CMP12+LP+TP+TSH+6AC+CBC/D/PLT
A/G RATIO: 1.1 (ref 1.1–2.5)
ALT: 22 IU/L (ref 0–32)
AST: 22 IU/L (ref 0–40)
Albumin: 4 g/dL (ref 3.6–4.8)
Alkaline Phosphatase: 79 IU/L (ref 39–117)
BUN/Creatinine Ratio: 16 (ref 11–26)
BUN: 16 mg/dL (ref 8–27)
Basophils Absolute: 0 10*3/uL (ref 0.0–0.2)
Basos: 0 %
Bilirubin Total: 0.3 mg/dL (ref 0.0–1.2)
CALCIUM: 9.4 mg/dL (ref 8.7–10.3)
CHOL/HDL RATIO: 3.2 ratio (ref 0.0–4.4)
Chloride: 102 mmol/L (ref 96–106)
Cholesterol, Total: 178 mg/dL (ref 100–199)
Creatinine, Ser: 1.03 mg/dL — ABNORMAL HIGH (ref 0.57–1.00)
EOS (ABSOLUTE): 0.1 10*3/uL (ref 0.0–0.4)
Eos: 3 %
Free Thyroxine Index: 2.3 (ref 1.2–4.9)
GFR calc Af Amer: 66 mL/min/{1.73_m2} (ref >59)
GFR calc non Af Amer: 58 mL/min/{1.73_m2} — ABNORMAL LOW (ref >59)
GGT: 15 IU/L (ref 0–60)
GLUCOSE: 91 mg/dL (ref 65–99)
Globulin, Total: 3.5 g/dL (ref 1.5–4.5)
HDL: 56 mg/dL (ref >39)
Hematocrit: 37.5 % (ref 34.0–46.6)
Hemoglobin: 12.7 g/dL (ref 11.1–15.9)
IMMATURE GRANS (ABS): 0 10*3/uL (ref 0.0–0.1)
IRON: 64 ug/dL (ref 27–139)
Immature Granulocytes: 0 %
LDH: 173 IU/L (ref 119–226)
LDL Calculated: 107 mg/dL — ABNORMAL HIGH (ref 0–99)
LYMPHS ABS: 1.5 10*3/uL (ref 0.7–3.1)
LYMPHS: 32 %
MCH: 31.4 pg (ref 26.6–33.0)
MCHC: 33.9 g/dL (ref 31.5–35.7)
MCV: 93 fL (ref 79–97)
MONOS ABS: 0.2 10*3/uL (ref 0.1–0.9)
Monocytes: 5 %
NEUTROS ABS: 2.7 10*3/uL (ref 1.4–7.0)
Neutrophils: 60 %
PHOSPHORUS: 3.7 mg/dL (ref 2.5–4.5)
POTASSIUM: 4.9 mmol/L (ref 3.5–5.2)
Platelets: 358 10*3/uL (ref 150–379)
RBC: 4.04 x10E6/uL (ref 3.77–5.28)
RDW: 13.7 % (ref 12.3–15.4)
Sodium: 139 mmol/L (ref 134–144)
T3 UPTAKE RATIO: 26 % (ref 24–39)
T4 TOTAL: 8.9 ug/dL (ref 4.5–12.0)
TOTAL PROTEIN: 7.5 g/dL (ref 6.0–8.5)
TSH: 2.83 u[IU]/mL (ref 0.450–4.500)
Triglycerides: 75 mg/dL (ref 0–149)
URIC ACID: 6.4 mg/dL (ref 2.5–7.1)
VLDL Cholesterol Cal: 15 mg/dL (ref 5–40)
WBC: 4.6 10*3/uL (ref 3.4–10.8)

## 2015-08-21 LAB — MICROALBUMIN / CREATININE URINE RATIO
CREATININE, UR: 79.4 mg/dL
MICROALB/CREAT RATIO: <3.8 mg/g creat (ref 0.0–30.0)

## 2015-08-21 LAB — VITAMIN D 25 HYDROXY (VIT D DEFICIENCY, FRACTURES): VIT D 25 HYDROXY: 39.4 ng/mL (ref 30.0–100.0)

## 2015-08-21 LAB — HEPATITIS C ANTIBODY (REFLEX)

## 2015-08-21 LAB — HCV COMMENT:

## 2015-08-22 NOTE — Progress Notes (Signed)
Lab results were sent to Dr. Derrel Nip per patient's request.

## 2015-08-23 ENCOUNTER — Encounter: Payer: Self-pay | Admitting: Internal Medicine

## 2015-09-01 ENCOUNTER — Ambulatory Visit (INDEPENDENT_AMBULATORY_CARE_PROVIDER_SITE_OTHER): Payer: Medicare HMO | Admitting: Internal Medicine

## 2015-09-01 ENCOUNTER — Encounter: Payer: Self-pay | Admitting: Internal Medicine

## 2015-09-01 VITALS — BP 146/102 | HR 77 | Temp 97.5°F | Resp 12 | Ht 64.5 in | Wt 204.0 lb

## 2015-09-01 DIAGNOSIS — R1010 Upper abdominal pain, unspecified: Secondary | ICD-10-CM

## 2015-09-01 DIAGNOSIS — I10 Essential (primary) hypertension: Secondary | ICD-10-CM

## 2015-09-01 DIAGNOSIS — G8929 Other chronic pain: Secondary | ICD-10-CM | POA: Diagnosis not present

## 2015-09-01 DIAGNOSIS — R1011 Right upper quadrant pain: Principal | ICD-10-CM

## 2015-09-01 MED ORDER — OLMESARTAN MEDOXOMIL-HCTZ 40-12.5 MG PO TABS: 1.0000 | ORAL_TABLET | Freq: Every day | ORAL | Status: DC

## 2015-09-01 NOTE — Patient Instructions (Signed)
I am ordering an ultrasound to evaluate you for gallstones and other gallbladder problems    I am changing your blood pressure regimen to a once daily olmesartan/hct combination paill  Goal bp is 130/80 or less.  Return to your lab in one week to check electrolytes

## 2015-09-01 NOTE — Progress Notes (Signed)
Pre-visit discussion using our clinic review tool. No additional management support is needed unless otherwise documented below in the visit note.  

## 2015-09-01 NOTE — Progress Notes (Signed)
Subjective:  Patient ID: Kathleen James, female    DOB: 12/08/1950  Age: 65 y.o. MRN: [id number]  CC: The primary encounter diagnosis was Abdominal pain, chronic, right upper quadrant. A diagnosis of Essential hypertension was also pertinent to this visit.  HPI DENISS FREUNDLICH presents for right upper quadrant pain that feels crampy .  Has been present for 3-4 weeks. Initially her pain was more severe and constant  but she started taking daily nexium and altered her diet.  The pain is not aggravated by exercise but is made worse by eating.  Occasional nausea but not a lot, and no vomiting.  Previously associated with indigestion .  gets better with aleve And nexium,  But has stopped the nexium and it has not gotten worse   Outpatient Prescriptions Prior to Visit  Medication Sig Dispense Refill  . ALPRAZolam (XANAX) 0.25 MG tablet Take 1 tablet (0.25 mg total) by mouth daily as needed for sleep or anxiety. 45 tablet 5  . olmesartan (BENICAR) 20 MG tablet Take 2 tablets (40 mg total) by mouth daily. 180 tablet 0  . aspirin 81 MG tablet Take 81 mg by mouth daily. Reported on 09/01/2015    . Multiple Vitamin (MULTI-VITAMINS) TABS Take 1,000 mg by mouth daily. Reported on 09/01/2015    . Multiple Vitamin (MULTIVITAMIN) tablet Take 1 tablet by mouth daily. Reported on 09/01/2015     No facility-administered medications prior to visit.    Review of Systems;  Patient denies headache, fevers, malaise, unintentional weight loss, skin rash, eye pain, sinus congestion and sinus pain, sore throat, dysphagia,  hemoptysis , cough, dyspnea, wheezing, chest pain, palpitations, orthopnea, edema, abdominal pain, nausea, melena, diarrhea, constipation, flank pain, dysuria, hematuria, urinary  Frequency, nocturia, numbness, tingling, seizures,  Focal weakness, Loss of consciousness,  Tremor, insomnia, depression, anxiety, and suicidal ideation.      Objective:  BP 146/102 mmHg  Pulse 77  Temp(Src) 97.5 F  (36.4 C) (Oral)  Resp 12  Ht 5' 4.5" (1.638 m)  Wt 204 lb (92.534 kg)  BMI 34.49 kg/m2  SpO2 98%  BP Readings from Last 3 Encounters:  09/01/15 146/102  08/05/15 138/100  06/07/14 124/88    Wt Readings from Last 3 Encounters:  09/01/15 204 lb (92.534 kg)  08/05/15 202 lb 8 oz (91.853 kg)  06/07/14 204 lb 4 oz (92.647 kg)    General appearance: alert, cooperative and appears stated age Ears: normal TM's and external ear canals both ears Throat: lips, mucosa, and tongue normal; teeth and gums normal Neck: no adenopathy, no carotid bruit, supple, symmetrical, trachea midline and thyroid not enlarged, symmetric, no tenderness/mass/nodules Back: symmetric, no curvature. ROM normal. No CVA tenderness. Lungs: clear to auscultation bilaterally Heart: regular rate and rhythm, S1, S2 normal, no murmur, click, rub or gallop Abdomen: soft, tender to deep palpation in mid epigastric region ; bowel sounds normal; no masses,  no organomegaly Pulses: 2+ and symmetric Skin: Skin color, texture, turgor normal. No rashes or lesions Lymph nodes: Cervical, supraclavicular, and axillary nodes normal.  No results found for: HGBA1C  Lab Results  Component Value Date   CREATININE 1.03* 08/20/2015   CREATININE 1.1 05/02/2014   CREATININE 1.1 01/10/2013    Lab Results  Component Value Date   WBC 4.6 08/20/2015   HGB 13.7 05/02/2014   HCT 37.5 08/20/2015   PLT 358 08/20/2015   GLUCOSE 91 08/20/2015   CHOL 178 08/20/2015   TRIG 75 08/20/2015   HDL 56  08/20/2015   LDLCALC 107* 08/20/2015   ALT 22 08/20/2015   AST 22 08/20/2015   NA 139 08/20/2015   K 4.9 08/20/2015   CL 102 08/20/2015   CREATININE 1.03* 08/20/2015   BUN 16 08/20/2015   CO2 21 05/02/2014   TSH 2.830 08/20/2015   MICROALBUR 0.2 06/07/2014    No results found.  Assessment & Plan:   Problem List Items Addressed This Visit    Essential hypertension    Not well controlled currently on olmesartan  20 mg bid. home  checks elevated as well.   Change to olmesartan/hct once daily and check home readings after one week.       Relevant Medications   olmesartan-hydrochlorothiazide (BENICAR HCT) 40-12.5 MG tablet   Abdominal pain, chronic, right upper quadrant - Primary    RUQ ultrasound ordered to rule out gallstones.  May need HIDA scan.  Continue Nexium for now.       Relevant Orders   US Abdomen Limited RUQ      I have discontinued Ms. Hendrix's olmesartan. I am also having her start on olmesartan-hydrochlorothiazide. Additionally, I am having her maintain her multivitamin, aspirin, MULTI-VITAMINS, and ALPRAZolam.  Meds ordered this encounter  Medications  . olmesartan-hydrochlorothiazide (BENICAR HCT) 40-12.5 MG tablet    Sig: Take 1 tablet by mouth daily.    Dispense:  30 tablet    Refill:  0    Medications Discontinued During This Encounter  Medication Reason  . olmesartan (BENICAR) 20 MG tablet     Follow-up: No Follow-up on file.   [name], MD

## 2015-09-02 NOTE — Assessment & Plan Note (Signed)
RUQ ultrasound ordered to rule out gallstones.  May need HIDA scan.  Continue Nexium for now.

## 2015-09-02 NOTE — Assessment & Plan Note (Addendum)
Not well controlled currently on olmesartan  20 mg bid. home checks elevated as well.   Change to olmesartan/hct once daily and check home readings after one week.

## 2015-09-03 ENCOUNTER — Encounter: Payer: Self-pay | Admitting: Internal Medicine

## 2015-09-09 ENCOUNTER — Other Ambulatory Visit: Payer: Self-pay | Admitting: Internal Medicine

## 2015-09-09 ENCOUNTER — Ambulatory Visit
Admission: RE | Admit: 2015-09-09 | Discharge: 2015-09-09 | Disposition: A | Discharge status: Home / Self Care | Payer: Medicare HMO | Source: Ambulatory Visit | Attending: Internal Medicine | Admitting: Internal Medicine

## 2015-09-09 ENCOUNTER — Telehealth: Payer: Self-pay

## 2015-09-09 DIAGNOSIS — R1011 Right upper quadrant pain: Secondary | ICD-10-CM | POA: Diagnosis not present

## 2015-09-09 DIAGNOSIS — K7689 Other specified diseases of liver: Secondary | ICD-10-CM | POA: Diagnosis not present

## 2015-09-09 DIAGNOSIS — R160 Hepatomegaly, not elsewhere classified: Secondary | ICD-10-CM

## 2015-09-09 DIAGNOSIS — G8929 Other chronic pain: Secondary | ICD-10-CM

## 2015-09-09 NOTE — Telephone Encounter (Signed)
Patient notified, ct scan ordered

## 2015-09-09 NOTE — Telephone Encounter (Signed)
ABNORMAL RESULTS: Abnormal appearance of the liver worrisome of pathologic masses these are new since previous study hepatic protocol MRI or CT scanning is recommended, gallbladder and common bile duct are unremarkable. Results in in blue folder. Please advise, thanks

## 2015-09-10 ENCOUNTER — Other Ambulatory Visit: Payer: Self-pay

## 2015-09-10 DIAGNOSIS — Z299 Encounter for prophylactic measures, unspecified: Secondary | ICD-10-CM

## 2015-09-10 NOTE — Progress Notes (Signed)
Patient came in to have blood drawn per dr. Lupita Dawn orders.  Blood was drawn from the right arm without any incident.

## 2015-09-11 LAB — COMPREHENSIVE METABOLIC PANEL
ALT: 24 IU/L (ref 0–32)
AST: 22 IU/L (ref 0–40)
Albumin/Globulin Ratio: 1.1 — ABNORMAL LOW (ref 1.2–2.2)
Albumin: 4.2 g/dL (ref 3.6–4.8)
Alkaline Phosphatase: 87 IU/L (ref 39–117)
BILIRUBIN TOTAL: 0.3 mg/dL (ref 0.0–1.2)
BUN/Creatinine Ratio: 15 (ref 11–26)
BUN: 16 mg/dL (ref 8–27)
CALCIUM: 9.1 mg/dL (ref 8.7–10.3)
CHLORIDE: 97 mmol/L (ref 96–106)
CO2: 23 mmol/L (ref 18–29)
CREATININE: 1.06 mg/dL — AB (ref 0.57–1.00)
GFR, EST AFRICAN AMERICAN: 64 mL/min/{1.73_m2} (ref >59)
GFR, EST NON AFRICAN AMERICAN: 55 mL/min/{1.73_m2} — AB (ref >59)
GLUCOSE: 97 mg/dL (ref 65–99)
Globulin, Total: 3.7 g/dL (ref 1.5–4.5)
Potassium: 4.2 mmol/L (ref 3.5–5.2)
Sodium: 136 mmol/L (ref 134–144)
TOTAL PROTEIN: 7.9 g/dL (ref 6.0–8.5)

## 2015-09-11 LAB — MICROALBUMIN / CREATININE URINE RATIO
Creatinine, Urine: 72.3 mg/dL
MICROALB/CREAT RATIO: 5.4 mg/g{creat} (ref 0.0–30.0)
Microalbumin, Urine: 3.9 ug/mL

## 2015-09-11 LAB — LIPASE: Lipase: 49 U/L (ref 0–59)

## 2015-09-12 NOTE — Progress Notes (Signed)
Results were sent to Dr. Derrel Nip.

## 2015-09-17 ENCOUNTER — Telehealth: Payer: Self-pay | Admitting: Internal Medicine

## 2015-09-17 ENCOUNTER — Ambulatory Visit
Admission: RE | Admit: 2015-09-17 | Discharge: 2015-09-17 | Disposition: A | Discharge status: Home / Self Care | Payer: Medicare HMO | Source: Ambulatory Visit | Attending: Internal Medicine | Admitting: Internal Medicine

## 2015-09-17 DIAGNOSIS — D1803 Hemangioma of intra-abdominal structures: Secondary | ICD-10-CM | POA: Insufficient documentation

## 2015-09-17 DIAGNOSIS — R160 Hepatomegaly, not elsewhere classified: Secondary | ICD-10-CM

## 2015-09-17 DIAGNOSIS — K7689 Other specified diseases of liver: Secondary | ICD-10-CM | POA: Insufficient documentation

## 2015-09-17 MED ORDER — IOPAMIDOL (ISOVUE-300) INJECTION 61%
100.0000 mL | Freq: Once | INTRAVENOUS | Status: AC | PRN
  Administered 2015-09-17: 100 mL via INTRAVENOUS

## 2015-09-17 NOTE — Telephone Encounter (Signed)
Linus Orn [phone] called from [employer] to inform that the pt results is in epic. Not stat just needing to be called in.Thank you!

## 2015-09-17 NOTE — Telephone Encounter (Signed)
FYI: CT of Abdomen results available in EPIC, I can call and give pt test results. Please advise, thanks

## 2015-09-17 NOTE — Telephone Encounter (Signed)
DO NOT TELL HER THE RESULTS.  PLEASE ASK HER TO COME IN TOMORROW AT 11:30 TO DISCUSS THE RESULTS WITH ME.

## 2015-09-18 ENCOUNTER — Encounter: Payer: Self-pay | Admitting: Internal Medicine

## 2015-09-18 ENCOUNTER — Encounter: Payer: Self-pay | Admitting: *Deleted

## 2015-09-18 ENCOUNTER — Ambulatory Visit (INDEPENDENT_AMBULATORY_CARE_PROVIDER_SITE_OTHER): Payer: Medicare HMO | Admitting: Internal Medicine

## 2015-09-18 ENCOUNTER — Other Ambulatory Visit: Payer: Self-pay | Admitting: Internal Medicine

## 2015-09-18 VITALS — BP 154/98 | HR 72 | Temp 97.6°F | Resp 12 | Ht 64.5 in | Wt 201.8 lb

## 2015-09-18 DIAGNOSIS — K6389 Other specified diseases of intestine: Secondary | ICD-10-CM | POA: Diagnosis not present

## 2015-09-18 DIAGNOSIS — I10 Essential (primary) hypertension: Secondary | ICD-10-CM

## 2015-09-18 MED ORDER — AMLODIPINE BESYLATE 5 MG PO TABS: 5.0000 mg | ORAL_TABLET | Freq: Every day | ORAL | Status: DC

## 2015-09-18 MED ORDER — ALPRAZOLAM 0.25 MG PO TABS: 0.2500 mg | ORAL_TABLET | Freq: Every day | ORAL | Status: DC | PRN

## 2015-09-18 MED ORDER — OLMESARTAN MEDOXOMIL 40 MG PO TABS: 40.0000 mg | ORAL_TABLET | Freq: Every day | ORAL | Status: DC

## 2015-09-18 NOTE — Telephone Encounter (Signed)
Pt is coming in at 11:30am

## 2015-09-18 NOTE — Patient Instructions (Addendum)
You have an appointment with Dr Bary Castilla next Tuesday at 11:15 to review the CT and plan your colonoscopy  If you need to increase your alprazolam use between now and then to help you sleep or cope,  Do so cautiously (increaes to a full tablet ) and you can use it up to three times per day if needed   Do not skip meals .  Try the Premier protein shakes.    Use these shakes to combat morning anorexia   Stopping olmesartan HCTt.  Starting olmesartAN 40 MG AND ADDING AMLODIPINE 5 MG

## 2015-09-20 DIAGNOSIS — K6389 Other specified diseases of intestine: Secondary | ICD-10-CM | POA: Insufficient documentation

## 2015-09-20 NOTE — Progress Notes (Signed)
Subjective:  Patient ID: Kathleen James, female    DOB: 1951/04/03  Age: 65 y.o. MRN: [id number]  CC: The primary encounter diagnosis was Colonic mass. A diagnosis of Essential hypertension was also pertinent to this visit.  HPI Kathleen James presents for discussion of abnormal CT results.  Patient is accompanied by her husband and daughter who is an Therapist, sports in the Cardiology Detp at [facility]  Patient was seen in february after being lost to follow up for 1-2 years .  She underwent an abdominal ultrasound for investigation of RUQ pain  Of recent onset, and multiple lesions were seen in the liver.  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  Her last colonoscopy was in 2002., followed by two negative annual screening FOBTs , the last one done  in 2014, before being lost to follow up.  No history of blood in stool ,  No FH colon CA   She has lost 3 lbs since her last visit 2 weeks ago.  She has had some anorexia, and made dietary changes that have resulted in an improvement in her RUQ pain.  She denies nausea.  She has been having intermittent constipation alternating with loose stools. She denies any history of hematochezia.  Outpatient Prescriptions Prior to Visit  Medication Sig Dispense Refill  . ALPRAZolam (XANAX) 0.25 MG tablet Take 1 tablet (0.25 mg total) by mouth daily as needed for sleep or anxiety. 45 tablet 5  . olmesartan-hydrochlorothiazide (BENICAR HCT) 40-12.5 MG tablet Take 1 tablet by mouth daily. 30 tablet 0  . aspirin 81 MG tablet Take 81 mg by mouth daily. Reported on 09/18/2015    . Multiple Vitamin (MULTI-VITAMINS) TABS Take 1,000 mg by mouth daily. Reported on 09/18/2015    . Multiple Vitamin (MULTIVITAMIN) tablet Take 1 tablet by mouth daily. Reported on  09/18/2015     No facility-administered medications prior to visit.    Review of Systems;  Patient denies headache, fevers, malaise, unintentional weight loss, skin rash, eye pain, sinus congestion and sinus pain, sore throat, dysphagia,  hemoptysis , cough, dyspnea, wheezing, chest pain, palpitations, orthopnea, edema, abdominal pain, nausea, melena, diarrhea, constipation, flank pain, dysuria, hematuria, urinary  Frequency, nocturia, numbness, tingling, seizures,  Focal weakness, Loss of consciousness,  Tremor, insomnia, depression, anxiety, and suicidal ideation.      Objective:  BP 154/98 mmHg  Pulse 72  Temp(Src) 97.6 F (36.4 C) (Oral)  Resp 12  Ht 5' 4.5" (1.638 m)  Wt 201 lb 12 oz (91.513 kg)  BMI 34.11 kg/m2  SpO2 96%  BP Readings from Last 3 Encounters:  09/18/15 154/98  09/01/15 146/102  08/05/15 138/100    Wt Readings from Last 3 Encounters:  09/18/15 201 lb 12 oz (91.513 kg)  09/01/15 204 lb (92.534 kg)  08/05/15 202 lb 8 oz (91.853 kg)    General appearance: alert, cooperative and appears stated age Ears: normal TM's and external ear canals both ears Throat: lips, mucosa, and tongue normal; teeth and gums normal Neck: no adenopathy, no carotid bruit, supple, symmetrical, trachea midline and thyroid not enlarged, symmetric, no tenderness/mass/nodules Back: symmetric, no curvature. ROM normal. No CVA tenderness. Lungs: clear to auscultation bilaterally Heart: regular rate and rhythm, S1, S2 normal, no murmur, click, rub or gallop Abdomen: soft, non-tender; bowel  sounds normal; no masses,  no organomegaly Pulses: 2+ and symmetric Skin: Skin color, texture, turgor normal. No rashes or lesions Lymph nodes: Cervical, supraclavicular, and axillary nodes normal.  No results found for: HGBA1C  Lab Results  Component Value Date   CREATININE 1.06* 09/10/2015   CREATININE 1.03* 08/20/2015   CREATININE 1.1 05/02/2014    Lab Results  Component Value Date   WBC  4.6 08/20/2015   HGB 13.7 05/02/2014   HCT 37.5 08/20/2015   PLT 358 08/20/2015   GLUCOSE 97 09/10/2015   CHOL 178 08/20/2015   TRIG 75 08/20/2015   HDL 56 08/20/2015   LDLCALC 107* 08/20/2015   ALT 24 09/10/2015   AST 22 09/10/2015   NA 136 09/10/2015   K 4.2 09/10/2015   CL 97 09/10/2015   CREATININE 1.06* 09/10/2015   BUN 16 09/10/2015   CO2 23 09/10/2015   TSH 2.830 08/20/2015   MICROALBUR 0.2 06/07/2014    Ct Abdomen Pelvis W Contrast  09/17/2015  CLINICAL DATA:  RIGHT mid abdominal pain for 1 month, indigestion, liver masses by ultrasound question hemangiomata EXAM: CT ABDOMEN AND PELVIS WITH CONTRAST TECHNIQUE: Multidetector CT imaging of the abdomen and pelvis was performed using the standard protocol following bolus administration of intravenous contrast. Sagittal and coronal MPR images reconstructed from axial data set. CONTRAST:  161mL ISOVUE-300 IOPAMIDOL (ISOVUE-300) INJECTION 61% IV. Dilute oral contrast. COMPARISON:  05/25/2010 ; correlation ultrasound abdomen 09/09/2015 FINDINGS: Lung bases clear. Multiple small hepatic cysts. Multiple new low-attenuation foci within liver, largest 8.4 x 5.4 x 5.9 cm lateral aspect RIGHT lobe image 16. None of these lesions demonstrate atypical enhancement pattern of hepatic hemangioma. These lesions are concerning for hepatic metastases. Additional small lesion inferior aspect RIGHT lobe liver 16 x 11 mm image 41 unchanged since 2011, with enhancement characteristics at that time indicative of a small hepatic hemangioma. Gallbladder, spleen, pancreas, adrenal glands, and LEFT kidney normal appearance. RIGHT kidney demonstrates a lesion at the posterior aspect 2.6 x 1.6 x 1.8 cm image 34, containing foci of fat attenuation question small angiomyolipoma. Focal wall thickening at ascending colon immediately distal to ileocecal valve concerning for a colon neoplasm. Enlarged RIGHT ileocolic lymph nodes medial to ascending colon measuring up to 2.6  cm short axis. Mild sigmoid diverticulosis without evidence of diverticulitis. Remainder of colon unremarkable. Stomach and small bowel loops normal appearance. Bladder and ureters normal appearance. Uterus surgically absent with nonvisualization of ovaries and appendix. No free air, free fluid, or hernia. Osseous demineralization with degenerative disc disease changes greatest at L4-L5. 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 Colonoscopy recommended for further assessment. Additional small hepatic cysts and stable RIGHT lobe hepatic hemangioma. 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 These results will be called to the ordering clinician or representative by the Radiologist Assistant, and communication documented in the PACS or zVision Dashboard. Electronically Signed   By: Lavonia Dana M.D.   On: 09/17/2015 15:38    Assessment & Plan:   Problem List Items Addressed This Visit    Essential hypertension    Having hyponatremia with hctz.  changing meds to olmesartan 40 mg and amlodipine. 5 mg daily       Relevant Medications   olmesartan (BENICAR) 40 MG tablet   amLODipine (NORVASC) 5 MG tablet   Colonic mass - Primary  Proximal colon , with hepatic lesions worrisome for metastasis .  Patient and family advised.  Urgent referral to Dr Bary Castilla for colonoscopy and referral to Oncology ordered as well.       Relevant Orders   Ambulatory referral to Oncology     A total of 40 minutes was spent with patient more than half of which was spent in counseling patient on the above mentioned issues , reviewing and explaining recent labs and imaging studies done, and coordination of care.   I have discontinued Ms. Devereux's olmesartan-hydrochlorothiazide. I am also having her start on olmesartan and amLODipine.  Additionally, I am having her maintain her multivitamin, aspirin, MULTI-VITAMINS, and ALPRAZolam.  Meds ordered this encounter  Medications  . ALPRAZolam (XANAX) 0.25 MG tablet    Sig: Take 1 tablet (0.25 mg total) by mouth daily as needed for sleep or anxiety.    Dispense:  90 tablet    Refill:  5  . olmesartan (BENICAR) 40 MG tablet    Sig: Take 1 tablet (40 mg total) by mouth daily.    Dispense:  30 tablet    Refill:  2  . amLODipine (NORVASC) 5 MG tablet    Sig: Take 1 tablet (5 mg total) by mouth daily.    Dispense:  30 tablet    Refill:  3    Medications Discontinued During This Encounter  Medication Reason  . ALPRAZolam (XANAX) 0.25 MG tablet Reorder  . olmesartan-hydrochlorothiazide (BENICAR HCT) 40-12.5 MG tablet     Follow-up: No Follow-up on file.   [name], MD

## 2015-09-20 NOTE — Assessment & Plan Note (Signed)
Having hyponatremia with hctz.  changing meds to olmesartan 40 mg and amlodipine. 5 mg daily

## 2015-09-20 NOTE — Assessment & Plan Note (Signed)
Proximal colon , with hepatic lesions worrisome for metastasis .  Patient and family advised.  Urgent referral to Dr Bary Castilla for colonoscopy and referral to Oncology ordered as well.

## 2015-09-23 ENCOUNTER — Encounter: Payer: Self-pay | Admitting: General Surgery

## 2015-09-23 ENCOUNTER — Ambulatory Visit (INDEPENDENT_AMBULATORY_CARE_PROVIDER_SITE_OTHER): Payer: Medicare HMO | Admitting: General Surgery

## 2015-09-23 ENCOUNTER — Inpatient Hospital Stay: Payer: Medicare HMO | Attending: Internal Medicine | Admitting: Internal Medicine

## 2015-09-23 ENCOUNTER — Inpatient Hospital Stay: Payer: Medicare HMO

## 2015-09-23 ENCOUNTER — Encounter: Payer: Self-pay | Admitting: Internal Medicine

## 2015-09-23 VITALS — BP 124/67 | HR 93 | Temp 97.0°F | Resp 18 | Wt 202.4 lb

## 2015-09-23 VITALS — BP 124/78 | HR 82 | Resp 14 | Ht 67.0 in | Wt 201.0 lb

## 2015-09-23 DIAGNOSIS — C182 Malignant neoplasm of ascending colon: Secondary | ICD-10-CM | POA: Diagnosis not present

## 2015-09-23 DIAGNOSIS — C7A022 Malignant carcinoid tumor of the ascending colon: Secondary | ICD-10-CM | POA: Diagnosis not present

## 2015-09-23 DIAGNOSIS — D6481 Anemia due to antineoplastic chemotherapy: Secondary | ICD-10-CM | POA: Insufficient documentation

## 2015-09-23 DIAGNOSIS — Z803 Family history of malignant neoplasm of breast: Secondary | ICD-10-CM | POA: Diagnosis not present

## 2015-09-23 DIAGNOSIS — Z79899 Other long term (current) drug therapy: Secondary | ICD-10-CM | POA: Diagnosis not present

## 2015-09-23 DIAGNOSIS — R109 Unspecified abdominal pain: Secondary | ICD-10-CM | POA: Diagnosis not present

## 2015-09-23 DIAGNOSIS — R590 Localized enlarged lymph nodes: Secondary | ICD-10-CM | POA: Diagnosis not present

## 2015-09-23 DIAGNOSIS — Z5111 Encounter for antineoplastic chemotherapy: Secondary | ICD-10-CM | POA: Insufficient documentation

## 2015-09-23 DIAGNOSIS — M1990 Unspecified osteoarthritis, unspecified site: Secondary | ICD-10-CM | POA: Insufficient documentation

## 2015-09-23 DIAGNOSIS — N289 Disorder of kidney and ureter, unspecified: Secondary | ICD-10-CM | POA: Diagnosis not present

## 2015-09-23 DIAGNOSIS — C7B02 Secondary carcinoid tumors of liver: Secondary | ICD-10-CM | POA: Insufficient documentation

## 2015-09-23 DIAGNOSIS — K6389 Other specified diseases of intestine: Secondary | ICD-10-CM | POA: Diagnosis not present

## 2015-09-23 DIAGNOSIS — C189 Malignant neoplasm of colon, unspecified: Secondary | ICD-10-CM | POA: Diagnosis not present

## 2015-09-23 DIAGNOSIS — K769 Liver disease, unspecified: Secondary | ICD-10-CM | POA: Diagnosis not present

## 2015-09-23 DIAGNOSIS — K5730 Diverticulosis of large intestine without perforation or abscess without bleeding: Secondary | ICD-10-CM | POA: Diagnosis not present

## 2015-09-23 DIAGNOSIS — M8580 Other specified disorders of bone density and structure, unspecified site: Secondary | ICD-10-CM | POA: Insufficient documentation

## 2015-09-23 DIAGNOSIS — T451X5S Adverse effect of antineoplastic and immunosuppressive drugs, sequela: Secondary | ICD-10-CM | POA: Insufficient documentation

## 2015-09-23 DIAGNOSIS — Z7689 Persons encountering health services in other specified circumstances: Secondary | ICD-10-CM | POA: Insufficient documentation

## 2015-09-23 DIAGNOSIS — C787 Secondary malignant neoplasm of liver and intrahepatic bile duct: Secondary | ICD-10-CM

## 2015-09-23 DIAGNOSIS — I10 Essential (primary) hypertension: Secondary | ICD-10-CM | POA: Insufficient documentation

## 2015-09-23 DIAGNOSIS — C799 Secondary malignant neoplasm of unspecified site: Secondary | ICD-10-CM

## 2015-09-23 LAB — PROTIME-INR
INR: 0.98
PROTHROMBIN TIME: 13.2 s (ref 11.4–15.0)

## 2015-09-23 LAB — COMPREHENSIVE METABOLIC PANEL
ALT: 20 U/L (ref 14–54)
AST: 25 U/L (ref 15–41)
Albumin: 4 g/dL (ref 3.5–5.0)
Alkaline Phosphatase: 70 U/L (ref 38–126)
Anion gap: 3 — ABNORMAL LOW (ref 5–15)
BILIRUBIN TOTAL: 0.4 mg/dL (ref 0.3–1.2)
BUN: 17 mg/dL (ref 6–20)
CHLORIDE: 106 mmol/L (ref 101–111)
CO2: 28 mmol/L (ref 22–32)
CREATININE: 1.01 mg/dL — AB (ref 0.44–1.00)
Calcium: 8.9 mg/dL (ref 8.9–10.3)
GFR calc Af Amer: >60 mL/min (ref >60)
GFR, EST NON AFRICAN AMERICAN: 57 mL/min — AB (ref >60)
GLUCOSE: 106 mg/dL — AB (ref 65–99)
POTASSIUM: 3.5 mmol/L (ref 3.5–5.1)
Sodium: 137 mmol/L (ref 135–145)
Total Protein: 8.4 g/dL — ABNORMAL HIGH (ref 6.5–8.1)

## 2015-09-23 LAB — CBC WITH DIFFERENTIAL/PLATELET
Basophils Absolute: 0 10*3/uL (ref 0–0.1)
Basophils Relative: 1 %
EOS PCT: 1 %
Eosinophils Absolute: 0.1 10*3/uL (ref 0–0.7)
HCT: 36.2 % (ref 35.0–47.0)
Hemoglobin: 12.3 g/dL (ref 12.0–16.0)
LYMPHS ABS: 1.3 10*3/uL (ref 1.0–3.6)
LYMPHS PCT: 27 %
MCH: 31.4 pg (ref 26.0–34.0)
MCHC: 34 g/dL (ref 32.0–36.0)
MCV: 92.3 fL (ref 80.0–100.0)
MONO ABS: 0.2 10*3/uL (ref 0.2–0.9)
Monocytes Relative: 5 %
Neutro Abs: 3.2 10*3/uL (ref 1.4–6.5)
Neutrophils Relative %: 66 %
PLATELETS: 314 10*3/uL (ref 150–440)
RBC: 3.92 MIL/uL (ref 3.80–5.20)
RDW: 13.1 % (ref 11.5–14.5)
WBC: 4.7 10*3/uL (ref 3.6–11.0)

## 2015-09-23 LAB — APTT: aPTT: 27 seconds (ref 24–36)

## 2015-09-23 NOTE — Progress Notes (Signed)
[name] NOTE  [name], [name], [name], MD (General Surgery)  CHIEF COMPLAINTS/PURPOSE OF CONSULTATION:   # MARCH 2017- Multiple liver lesions [largest- 8.4x5.4x5.9; [name]; [name]  # right kidney lesion 2.6 x 1.6 cm ? Angiolipoma [followed by Urology in past]  HISTORY OF PRESENTING ILLNESS:  Kathleen James 65 y.o.  female with no significant past medical history except for HTN noted to have intermittent abdominal discomfort of the last 2-3 months. She denies any constipation. She does have a history of loose stools chronic. Denies any nausea vomiting. Denies any jaundice. Her appetite is good. No significant weight loss. Patient denies having a colonoscopy in the past. Denies any blood in stools or black stools.   Patient had an ultrasound of the liver for further evaluation that was abnormal/ which led to CT scan-summarized above. She has been deferred was for further evaluation. She had met with Dr. Bary Castilla this morning.  ROS: A complete 10 point review of system is done which is negative except mentioned above in history of present illness  MEDICAL HISTORY:  Past Medical History  Diagnosis Date  . Hypertension   . Obesity (BMI 30-39.9)   . Angiolipoma of kidney     followed with serial CTs ,,  Dr. Jacqlyn Larsen  . Hammer toe     SURGICAL HISTORY: Past Surgical History  Procedure Laterality Date  . Abdominal hysterectomy  1995    endometriosis, heavy bleeding  . Oophorectomy    . Rotator cuff repair Right 2008    right shoulder.  [facility]   . Cesarean section  1985    SOCIAL HISTORY: She lives at home with her family in [place]. 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 Social History   Social History  . Marital Status: Married    Spouse Name: N/A  . Number of  Children: N/A  . Years of Education: N/A   Occupational History  . Not on file.   Social History Main Topics  . Smoking status: Never Smoker   . Smokeless tobacco: Never Used  . Alcohol Use: 0.0 oz/week    0 Standard drinks or equivalent per week     Comment: occasionally  . Drug Use: No  . Sexual Activity: Not Currently   Other Topics Concern  . Not on file   Social History Narrative    FAMILY HISTORY: Family History  Problem Relation Age of Onset  . Mental illness Mother 32    bipolar, dementia  . Cancer Maternal Aunt 70    breast ca  . Hypertension Father   . Stroke Father 49    deceased  . COPD Father   . Heart disease Sister     deceased    ALLERGIES:  is allergic to bee venom and morphine and related.  MEDICATIONS:  Current Outpatient Prescriptions  Medication Sig Dispense Refill  . ALPRAZolam (XANAX) 0.25 MG tablet Take 1 tablet (0.25 mg total) by mouth daily as needed for sleep or anxiety. 90 tablet 5  . amLODipine (NORVASC) 5 MG tablet Take 1 tablet (5 mg total) by mouth daily. 30 tablet 3  . olmesartan (BENICAR) 40 MG tablet Take 1 tablet (40 mg total) by mouth daily. 30 tablet 2   No current facility-administered medications for this visit.      Marland Kitchen  PHYSICAL EXAMINATION: ECOG PERFORMANCE STATUS: 0 - Asymptomatic  Filed Vitals:   09/23/15 1339  BP: 124/67  Pulse: 93  Temp: 97 F (36.1 C)  Resp: 18   Filed Weights   09/23/15 1339  Weight: 202 lb 6.1 oz (91.8 kg)    GENERAL: Well-nourished well-developed; Alert, no distress and comfortable.  Accompanied by her husband and daughter. EYES: no pallor or icterus OROPHARYNX: no thrush or ulceration; good dentition  NECK: supple, no masses felt LYMPH:  no palpable lymphadenopathy in the cervical, axillary or inguinal regions LUNGS: clear to auscultation and  No wheeze or crackles HEART/CVS: regular rate & rhythm and no murmurs; No lower extremity edema ABDOMEN: abdomen soft, non-tender and  normal bowel sounds Musculoskeletal:no cyanosis of digits and no clubbing  PSYCH: alert & oriented x 3 with fluent speech NEURO: no focal motor/sensory deficits SKIN:  no rashes or significant lesions  LABORATORY DATA:  I have reviewed the data as listed Lab Results  Component Value Date   WBC 4.7 09/23/2015   HGB 12.3 09/23/2015   HCT 36.2 09/23/2015   MCV 92.3 09/23/2015   PLT 314 09/23/2015    Recent Labs  08/20/15 0841 09/10/15 0818 09/23/15 1504  NA 139 136 137  K 4.9 4.2 3.5  CL 102 97 106  CO2  --  23 28  GLUCOSE 91 97 106*  BUN _0 CREATININE 1.03* 1.06* 1.01*  CALCIUM 9.4 9.1 8.9  GFRNONAA 58* 55* 57*  GFRAA 66 64 >60  PROT 7.5 7.9 8.4*  ALBUMIN 4.0 4.2 4.0  AST _1 ALT _2 ALKPHOS 79 87 70  BILITOT 0.3 0.3 0.4    RADIOGRAPHIC STUDIES: I have personally reviewed the radiological images as listed and agreed with the findings in the report. Ct Abdomen Pelvis W Contrast  09/17/2015  CLINICAL DATA:  RIGHT mid abdominal pain for 1 month, indigestion, liver masses by ultrasound question hemangiomata EXAM: CT ABDOMEN AND PELVIS WITH CONTRAST TECHNIQUE: Multidetector CT imaging of the abdomen and pelvis was performed using the standard protocol following bolus administration of intravenous contrast. Sagittal and coronal MPR images reconstructed from axial data set. CONTRAST:  110m ISOVUE-300 IOPAMIDOL (ISOVUE-300) INJECTION 61% IV. Dilute oral contrast. COMPARISON:  05/25/2010 ; correlation ultrasound abdomen 09/09/2015 FINDINGS: Lung bases clear. Multiple small hepatic cysts. Multiple new low-attenuation foci within liver, largest 8.4 x 5.4 x 5.9 cm lateral aspect RIGHT lobe image 16. None of these lesions demonstrate atypical enhancement pattern of hepatic hemangioma. These lesions are concerning for hepatic metastases. Additional small lesion inferior aspect RIGHT lobe liver 16 x 11 mm image 41 unchanged since 2011, with enhancement characteristics at  that time indicative of a small hepatic hemangioma. Gallbladder, spleen, pancreas, adrenal glands, and LEFT kidney normal appearance. RIGHT kidney demonstrates a lesion at the posterior aspect 2.6 x 1.6 x 1.8 cm image 34, containing foci of fat attenuation question small angiomyolipoma. Focal wall thickening at ascending colon immediately distal to ileocecal valve concerning for a colon neoplasm. Enlarged RIGHT ileocolic lymph nodes medial to ascending colon measuring up to 2.6 cm short axis. Mild sigmoid diverticulosis without evidence of diverticulitis. Remainder of colon unremarkable. Stomach and small bowel loops normal appearance. Bladder and ureters normal appearance. Uterus surgically absent with nonvisualization of ovaries and appendix. No free air, free fluid, or hernia. Osseous demineralization with degenerative disc disease changes greatest at L4-L5. IMPRESSION: Suspected neoplasm of ascending colon with hepatic metastases and RIGHT ileocolic adenopathy as  above. Colonoscopy recommended for further assessment. Additional small hepatic cysts and stable RIGHT lobe hepatic hemangioma. 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 These results will be called to the ordering clinician or representative by the Radiologist Assistant, and communication documented in the PACS or zVision Dashboard. Electronically Signed   By: Lavonia Dana M.D.   On: 09/17/2015 15:38   US Abdomen Limited Ruq  09/09/2015  CLINICAL DATA:  Recurrent right upper quadrant abdominal pain postprandially EXAM: US ABDOMEN LIMITED - RIGHT UPPER QUADRANT COMPARISON:  CT scan of the abdomen of Oct 23, 2009 FINDINGS: Gallbladder: No gallstones or wall thickening visualized. No sonographic Murphy sign noted by sonographer. Common bile duct: Diameter: 5.2 mm Liver: The hepatic echotexture is heterogeneous. Hyperechoic masslike density in  the right lobe measures 6.5 x 6 x 6 cm. A second area of increased echogenicity with surrounding decreased echogenicity in the right lobe measures 2.6 x 2.5 x 2.3 cm. There is a cyst in the right lobe measuring 1.4 cm in greatest dimension. There is a left lobe cyst measuring 1.5 cm in greatest dimension. IMPRESSION: Abnormal appearance of the liver worrisome for the presence of pathologic masses. These are new since the previous study. Hepatic protocol MRI or CT scanning is recommended. The gallbladder and common bile duct are unremarkable. These results will be called to the ordering clinician or representative by the Radiologist Assistant, and communication documented in the PACS or zVision Dashboard. Electronically Signed   By: David  Martinique M.D.   On: 09/09/2015 10:12    ASSESSMENT & PLAN:   # Multiple lesions in the liver- suspicious for metastatic disease/ presence of ileocolic primary. Recommend checking CBC CMP CEA. Since the patient is not obstructed- I would recommend proceeding with ultrasound-guided liver biopsy. I would recommend a PET scan for further evaluation. I would also recommend MRI of the liver for further evaluation of her liver lesions.  # If confirmed malignancy of colonic origin; this is likely advanced disease/stage IV. However I would wait on pathology/further imaging- to discuss the prognosis. I discussed with the patient and family that she will need chemotherapy; most likely FOLFOX every 2 weeks. If patient has a great response to chemotherapy after 4-6 cycles- she might be evaluated for surgery/colon resection; ? Liver resection if feasible.   # Discussed the above plan of care with Dr. Bary Castilla; recommended a port. The patient is seen 3-4 days after the liver biopsy.  Thank you Dr. Derrel Nip for allowing me to participate in the care of your pleasant patient. Please do not hesitate to contact me with questions or concerns in the interim.  # 60 minutes face-to-face with the  patient discussing the above plan of care; more than 50% of time spent on prognosis/ natural history; counseling and coordination.    [name], MD 09/23/2015 4:13 PM

## 2015-09-23 NOTE — Progress Notes (Signed)
Patient here today as new evaluation regarding colon mass.  Referred by Dr. Derrel Nip.

## 2015-09-23 NOTE — Patient Instructions (Signed)
Implanted Port Insertion An implanted port is a central line that has a round shape and is placed under the skin. It is used as a long-term IV access for:   Medicines, such as chemotherapy.   Fluids.   Liquid nutrition, such as total parenteral nutrition (TPN).   Blood samples.  LET YOUR HEALTH CARE PROVIDER KNOW ABOUT:  Allergies to food or medicine.   Medicines taken, including vitamins, herbs, eye drops, creams, and over-the-counter medicines.   Any allergies to heparin.  Use of steroids (by mouth or creams).   Previous problems with anesthetics or numbing medicines.   History of bleeding problems or blood clots.   Previous surgery.   Other health problems, including diabetes and kidney problems.   Possibility of pregnancy, if this applies. RISKS AND COMPLICATIONS Generally, this is a safe procedure. However, as with any procedure, problems can occur. Possible problems include:  Damage to the blood vessel, bruising, or bleeding at the puncture site.   Infection.  Blood clot in the vessel that the port is in.  Breakdown of the skin over your port.  Very rarely a person may develop a condition called a pneumothorax, a collection of air in the chest that may cause one of the lungs to collapse. The placement of these catheters with the appropriate imaging guidance significantly decreases the risk of a pneumothorax.  BEFORE THE PROCEDURE   Your health care provider may want you to have blood tests. These tests can help tell how well your kidneys and liver are working. They can also show how well your blood clots.   If you take blood thinners (anticoagulant medicines), ask your health care provider when you should stop taking them.   Make arrangements for someone to drive you home. This is necessary if you have been sedated for your procedure.  PROCEDURE  Port insertion usually takes about 30-45 minutes.   An IV needle will be inserted in your arm.  Medicine for pain and medicine to help relax you (sedative) will flow directly into your body through this needle.   You will lie on an exam table, and you will be connected to monitors to keep track of your heart rate, blood pressure, and breathing throughout the procedure.  An oxygen monitoring device may be attached to your finger. Oxygen will be given.   Everything will be kept as germ free (sterile) as possible during the procedure. The skin near the point of the incision will be cleansed with antiseptic, and the area will be draped with sterile towels. The skin and deeper tissues over the port area will be made numb with a local anesthetic.  Two small cuts (incisions) will be made in the skin to insert the port. One will be made in the neck to obtain access to the vein where the catheter will lie.   Because the port reservoir will be placed under the skin, a small skin incision will be made in the upper chest, and a small pocket for the port will be made under the skin. The catheter that will be connected to the port tunnels to a large central vein in the chest. A small, raised area will remain on your body at the site of the reservoir when the procedure is complete.  The port placement will be done under imaging guidance to ensure the proper placement.  The reservoir has a silicone covering that can be punctured with a special needle.   The port will be flushed with normal   saline, and blood will be drawn to make sure it is working properly.  There will be nothing remaining outside the skin when the procedure is finished.   Incisions will be held together by stitches, surgical glue, or a special tape. AFTER THE PROCEDURE  You will stay in a recovery area until the anesthesia has worn off. Your blood pressure and pulse will be checked.  A final chest X-ray will be taken to check the placement of the port and to ensure that there is no injury to your lung.   This information is  not intended to replace advice given to you by your health care provider. Make sure you discuss any questions you have with your health care provider.   Document Released: 03/28/2013 Document Revised: 06/28/2014 Document Reviewed: 03/28/2013 Elsevier Interactive Patient Education 2016 Elsevier Inc.  

## 2015-09-23 NOTE — Progress Notes (Signed)
Liver bx request form faxed to speciality scheduling

## 2015-09-23 NOTE — Progress Notes (Signed)
Patient ID: Kathleen James, female   DOB: 1950/09/14, 65 y.o.   MRN: [id number]  Chief Complaint  Patient presents with  . Abdominal Pain    colon mass    HPI Kathleen James is a 65 y.o. female.  Here today for evaluation of abdominal pain. The pain that been described in both the right upper below the rib cage as well as the right mid abdomen. She states this has been going on for a month now. He symptomatic for the last week. Salad trigger some pain.  Daily bowel movements. Initial suspicion was for biliary disease but ultrasound showed no gallstones but multiple hepatic lesions.CT scan showed a colon mass on 09/16/15 as well as confirmed multiple lesions within the liver compatible with metastatic disease.   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  The patient is accompanied by her daughter who is a coronary care nurse in [place] as well as her husband. Both her present for the interview and exam.  I personally reviewed the patient's history.  HPI  Past Medical History  Diagnosis Date  . Hypertension   . Obesity (BMI 30-39.9)   . Angiolipoma of kidney     followed with serial CTs ,,  Dr. Jacqlyn Larsen  . Hammer toe     Past Surgical History  Procedure Laterality Date  . Abdominal hysterectomy  1995    endometriosis, heavy bleeding  . Oophorectomy    . Rotator cuff repair Right 2008    right shoulder.  [facility]   . Cesarean section  1985    Family History  Problem Relation Age of Onset  . Mental illness Mother 19    bipolar, dementia  . Cancer Maternal Aunt 70    breast ca  . Hypertension Father   . Stroke Father 28    deceased  . COPD Father   . Heart disease Sister     deceased    Social History Social History  Substance Use Topics  . Smoking status: Never Smoker   . Smokeless tobacco: Never Used  . Alcohol Use: 0.0 oz/week     0 Standard drinks or equivalent per week     Comment: occasionally    Allergies  Allergen Reactions  . Bee Venom   . Morphine And Related Nausea Only    Current Outpatient Prescriptions  Medication Sig Dispense Refill  . ALPRAZolam (XANAX) 0.25 MG tablet Take 1 tablet (0.25 mg total) by mouth daily as needed for sleep or anxiety. 90 tablet 5  . amLODipine (NORVASC) 5 MG tablet Take 1 tablet (5 mg total) by mouth daily. 30 tablet 3  . olmesartan (BENICAR) 40 MG tablet Take 1 tablet (40 mg total) by mouth daily. 30 tablet 2   No current facility-administered medications for this visit.    Review of Systems Review of Systems  Constitutional: Negative for unexpected weight change.  HENT: Negative.   Eyes: Negative.   Respiratory: Negative.   Cardiovascular: Negative.   Gastrointestinal: Positive for abdominal pain. Negative for nausea and vomiting.  Endocrine: Negative.   Genitourinary: Negative.   Allergic/Immunologic: Negative.   Neurological: Negative.   Hematological: Negative.   Psychiatric/Behavioral: Negative.     Blood pressure 124/78, pulse 82, resp. rate 14, height 5\' 7"  (1.702 m), weight 201 lb (91.173 kg).  Physical Exam Physical Exam  Constitutional: She is oriented to person, place,  and time. She appears well-developed and well-nourished.  Eyes: Conjunctivae are normal. No scleral icterus.  Neck: Neck supple.  Cardiovascular: Normal rate, regular rhythm and normal heart sounds.   Pulmonary/Chest: Effort normal and breath sounds normal.  Abdominal: Soft. Bowel sounds are normal. There is no tenderness.  Lymphadenopathy:    She has no cervical adenopathy.       Left: No supraclavicular adenopathy present.  Neurological: She is alert and oriented to person, place, and time.  Skin: Skin is warm and dry.    Data Reviewed CT scan of the abdomen and pelvis did 09/17/2015 wasn't apparently reviewed.  Multiple right lobe hepatic metastases. Prior ultrasound  showed evidence of by lobar cyst. Mass in the ascending colon consistent with possible malignancy.  08/20/2015 CBC incomprehensible metabolic bowel notable only for a depressed GFR of 55. Normal hemoglobin and MCV.  Assessment    Metastatic colon cancer.    Plan    The patient is scheduled for medical oncology evaluation this afternoon. Further treatment plans will take place after that assessment.    Case review with Dr. Rogue Bussing after her evaluation this afternoon. He plans for liver biopsy to confirm the clinical impression of metastatic colon cancer as well as PET scan to determine full extent of disease. Neoadjuvant chemotherapy is planned with consideration for possible right hepatectomy/right colon resection depending on response. With no indication of impending colonic obstruction, resection of the primary tumor will be deferred.  No indication for colonoscopy at this time, may be indicated if the patient becomes a candidate for elective right colectomy to confirm no additional disease.  The risks associated with central venous access including arterial, pulmonary and venous injury were reviewed. The possible need for additional treatment if pulmonary injury occurs (chest tube placement) was discussed.   Discussed port placement with patient, patient agrees. PCP:  Deborra Medina  This information has been scribed by Karie Fetch RN, BSN,BC.    Kathleen James 09/23/2015, 8:56 PM

## 2015-09-24 ENCOUNTER — Telehealth: Payer: Self-pay | Admitting: Internal Medicine

## 2015-09-24 ENCOUNTER — Telehealth: Payer: Self-pay

## 2015-09-24 ENCOUNTER — Telehealth: Payer: Self-pay | Admitting: *Deleted

## 2015-09-24 LAB — CEA: CEA: 538.7 ng/mL — ABNORMAL HIGH (ref 0.0–4.7)

## 2015-09-24 NOTE — Telephone Encounter (Signed)
Patient's surgery has been scheduled for 10-01-15 at [facility] Instructions reviewed with the patient by phone today.   This patient reports that she is right handed so we will plan to place port on her left side.

## 2015-09-24 NOTE — Telephone Encounter (Signed)
Liver Biopsy can be performed on Friday 4/7. Pt will need to arrive at 830am for a 930am apt. Pt currently has a pet scan scheduled for Friday 4/7 at 1030am, which apt time will conflict with the liver bx. Per Dr. Rogue Bussing: the biopsy is the top priority. The pet scan can be r/s for next week.  MD has also ordered an MRI of the liver to further evaluate the liver lesions. These appts will be coordinated and [place] in cancer center scheduling will let the patient know the scheduled apt times.

## 2015-09-24 NOTE — Telephone Encounter (Signed)
Oncology Nurse Navigator Documentation  Oncology Nurse Navigator Flowsheets 09/24/2015  Navigator Location [facility]  Navigator Encounter Type Introductory phone call;Telephone  Telephone Outgoing Call  Acuity Level 1  Acuity Level 1 Initial guidance, education and coordination as needed;Minimal follow up required  Time Spent with Patient 15   Voicemail left for patient regarding navigation services introduction. Had appt with Med[facility]Onc in [place] [facility]. Noted upcoming liver bx and PET.

## 2015-09-24 NOTE — Telephone Encounter (Signed)
Received FMLA paperwork for patient daughter Jettie Booze in red folder.

## 2015-09-24 NOTE — Telephone Encounter (Signed)
Patient called and would like to schedule port placement. Please advise.

## 2015-09-25 ENCOUNTER — Other Ambulatory Visit: Payer: Self-pay | Admitting: General Surgery

## 2015-09-25 ENCOUNTER — Telehealth: Payer: Self-pay

## 2015-09-25 ENCOUNTER — Other Ambulatory Visit: Payer: Self-pay | Admitting: Physician Assistant

## 2015-09-25 DIAGNOSIS — C182 Malignant neoplasm of ascending colon: Secondary | ICD-10-CM

## 2015-09-25 NOTE — H&P (Signed)
Chief Complaint  Patient presents with  . Abdominal Pain    colon mass    HPI Kathleen James is a 65 y.o. female. Here today for evaluation of abdominal pain. The pain that been described in both the right upper below the rib cage as well as the right mid abdomen. She states this has been going on for a month now. He symptomatic for the last week. Salad trigger some pain. Daily bowel movements. Initial suspicion was for biliary disease but ultrasound showed no gallstones but multiple hepatic lesions.CT scan showed a colon mass on 09/16/15 as well as confirmed multiple lesions within the liver compatible with metastatic disease.   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  The patient is accompanied by her daughter who is a coronary care nurse in [place] as well as her husband. Both her present for the interview and exam.  I personally reviewed the patient's history.  HPI  Past Medical History  Diagnosis Date  . Hypertension   . Obesity (BMI 30-39.9)   . Angiolipoma of kidney     followed with serial CTs ,, Dr. Jacqlyn Larsen  . Hammer toe     Past Surgical History  Procedure Laterality Date  . Abdominal hysterectomy  1995    endometriosis, heavy bleeding  . Oophorectomy    . Rotator cuff repair Right 2008    right shoulder. [facility]   . Cesarean section  1985    Family History  Problem Relation Age of Onset  . Mental illness Mother 23    bipolar, dementia  . Cancer Maternal Aunt 70    breast ca  . Hypertension Father   . Stroke Father 32    deceased  . COPD Father   . Heart disease Sister     deceased    Social History Social History  Substance Use Topics  . Smoking status: Never Smoker   . Smokeless tobacco: Never Used  .  Alcohol Use: 0.0 oz/week    0 Standard drinks or equivalent per week     Comment: occasionally    Allergies  Allergen Reactions  . Bee Venom   . Morphine And Related Nausea Only    Current Outpatient Prescriptions  Medication Sig Dispense Refill  . ALPRAZolam (XANAX) 0.25 MG tablet Take 1 tablet (0.25 mg total) by mouth daily as needed for sleep or anxiety. 90 tablet 5  . amLODipine (NORVASC) 5 MG tablet Take 1 tablet (5 mg total) by mouth daily. 30 tablet 3  . olmesartan (BENICAR) 40 MG tablet Take 1 tablet (40 mg total) by mouth daily. 30 tablet 2   No current facility-administered medications for this visit.    Review of Systems Review of Systems  Constitutional: Negative for unexpected weight change.  HENT: Negative.  Eyes: Negative.  Respiratory: Negative.  Cardiovascular: Negative.  Gastrointestinal: Positive for abdominal pain. Negative for nausea and vomiting.  Endocrine: Negative.  Genitourinary: Negative.  Allergic/Immunologic: Negative.  Neurological: Negative.  Hematological: Negative.  Psychiatric/Behavioral: Negative.    Blood pressure 124/78, pulse 82, resp. rate 14, height 5\' 7"  (1.702 m), weight 201 lb (91.173 kg).  Physical Exam Physical Exam  Constitutional: She is oriented to person, place, and time. She appears well-developed and well-nourished.  Eyes: Conjunctivae are normal. No scleral icterus.  Neck: Neck supple.  Cardiovascular: Normal rate, regular rhythm and normal heart sounds.  Pulmonary/Chest:  Effort normal and breath sounds normal.  Abdominal: Soft. Bowel sounds are normal. There is no tenderness.  Lymphadenopathy:   She has no cervical adenopathy.   Left: No supraclavicular adenopathy present.  Neurological: She is alert and oriented to person, place, and time.  Skin: Skin is warm and dry.    Data Reviewed CT scan of the abdomen and pelvis did 09/17/2015 wasn't apparently  reviewed.  Multiple right lobe hepatic metastases. Prior ultrasound showed evidence of by lobar cyst. Mass in the ascending colon consistent with possible malignancy.  08/20/2015 CBC incomprehensible metabolic bowel notable only for a depressed GFR of 55. Normal hemoglobin and MCV.  Assessment    Metastatic colon cancer.    Plan    The patient is scheduled for medical oncology evaluation this afternoon. Further treatment plans will take place after that assessment.    Case review with Dr. Rogue Bussing after her evaluation this afternoon. He plans for liver biopsy to confirm the clinical impression of metastatic colon cancer as well as PET scan to determine full extent of disease. Neoadjuvant chemotherapy is planned with consideration for possible right hepatectomy/right colon resection depending on response. With no indication of impending colonic obstruction, resection of the primary tumor will be deferred.  No indication for colonoscopy at this time, may be indicated if the patient becomes a candidate for elective right colectomy to confirm no additional disease.  The risks associated with central venous access including arterial, pulmonary and venous injury were reviewed. The possible need for additional treatment if pulmonary injury occurs (chest tube placement) was discussed.   Discussed port placement with patient, patient agrees. PCP: Deborra Medina  This information has been scribed by Karie Fetch RN, BSN,BC.

## 2015-09-25 NOTE — Telephone Encounter (Signed)
  Oncology Nurse Navigator Documentation  Navigator Location: [facility] (09/25/15 1100) Navigator Encounter Type: Introductory phone call;Telephone (09/25/15 1100) Telephone: Incoming Call;Appt Confirmation/Clarification (09/25/15 1100)   Confirmed Diagnosis Date:  (For liver biopsy 4-7) (09/25/15 1100)       Treatment Phase: Abnormal Scans (09/25/15 1100) Barriers/Navigation Needs: Education (09/25/15 1100) Education: Understanding Cancer/ Treatment Options;Newly Diagnosed Cancer Education;Preparing for Upcoming Surgery/ Treatment (09/25/15 1100) Interventions: Education Method (09/25/15 1100)     Education Method: Verbal (09/25/15 1100)      Acuity: Level 2 (09/25/15 1100)   Acuity Level 2: Initial guidance, education and coordination as needed;Educational needs;Ongoing guidance and education throughout treatment as needed (09/25/15 1100)     Time Spent with Patient: 60 (09/25/15 1100)

## 2015-09-26 ENCOUNTER — Ambulatory Visit: Payer: Medicare HMO

## 2015-09-26 ENCOUNTER — Ambulatory Visit
Admission: RE | Admit: 2015-09-26 | Discharge: 2015-09-26 | Disposition: A | Discharge status: Home / Self Care | Payer: Medicare HMO | Source: Ambulatory Visit | Attending: Internal Medicine | Admitting: Internal Medicine

## 2015-09-26 DIAGNOSIS — R160 Hepatomegaly, not elsewhere classified: Secondary | ICD-10-CM | POA: Diagnosis not present

## 2015-09-26 DIAGNOSIS — Z6839 Body mass index (BMI) 39.0-39.9, adult: Secondary | ICD-10-CM | POA: Diagnosis not present

## 2015-09-26 DIAGNOSIS — M47816 Spondylosis without myelopathy or radiculopathy, lumbar region: Secondary | ICD-10-CM | POA: Insufficient documentation

## 2015-09-26 DIAGNOSIS — C182 Malignant neoplasm of ascending colon: Secondary | ICD-10-CM | POA: Diagnosis not present

## 2015-09-26 DIAGNOSIS — E669 Obesity, unspecified: Secondary | ICD-10-CM | POA: Diagnosis not present

## 2015-09-26 DIAGNOSIS — I10 Essential (primary) hypertension: Secondary | ICD-10-CM | POA: Diagnosis not present

## 2015-09-26 DIAGNOSIS — D1803 Hemangioma of intra-abdominal structures: Secondary | ICD-10-CM | POA: Diagnosis not present

## 2015-09-26 DIAGNOSIS — C787 Secondary malignant neoplasm of liver and intrahepatic bile duct: Secondary | ICD-10-CM | POA: Diagnosis not present

## 2015-09-26 DIAGNOSIS — K7689 Other specified diseases of liver: Secondary | ICD-10-CM | POA: Insufficient documentation

## 2015-09-26 HISTORY — DX: Malignant (primary) neoplasm, unspecified: C80.1

## 2015-09-26 HISTORY — DX: Unspecified osteoarthritis, unspecified site: M19.90

## 2015-09-26 MED ORDER — MIDAZOLAM HCL 5 MG/5ML IJ SOLN
INTRAMUSCULAR | Status: AC | PRN
  Administered 2015-09-26 (×2): 1 mg via INTRAVENOUS

## 2015-09-26 MED ORDER — SODIUM CHLORIDE 0.9 % IV SOLN
INTRAVENOUS | Status: DC
  Administered 2015-09-26: 10:00:00 via INTRAVENOUS

## 2015-09-26 MED ORDER — FENTANYL CITRATE (PF) 100 MCG/2ML IJ SOLN
INTRAMUSCULAR | Status: AC | PRN
  Administered 2015-09-26: 50 ug via INTRAVENOUS

## 2015-09-26 NOTE — H&P (Signed)
Chief Complaint: Here for liver bx   Referring Physician(s): Brahmanday,Govinda R  History of Present Illness: Kathleen James is a 65 y.o. female with Rt abdominal pain.  Ct revealed cecal mass with adjacent abnormal lymph nodes and hepaitc mets.  Here for liver bx today with [place].  Overall, doing well. No complaints this morning.  Past Medical History  Diagnosis Date  . Hypertension   . Obesity (BMI 30-39.9)   . Angiolipoma of kidney     followed with serial CTs ,,  Dr. Jacqlyn Larsen  . Hammer toe   . Arthritis   . Cancer [facility])     colon    Past Surgical History  Procedure Laterality Date  . Abdominal hysterectomy  1995    endometriosis, heavy bleeding  . Oophorectomy    . Rotator cuff repair Right 2008    right shoulder.  [place]   . Cesarean section  1985  . Bunionectomy Bilateral     Allergies: Bee venom and Morphine and related  Medications: Prior to Admission medications   Medication Sig Start Date End Date Taking? Authorizing Provider  ALPRAZolam (XANAX) 0.25 MG tablet Take 1 tablet (0.25 mg total) by mouth daily as needed for sleep or anxiety. 09/18/15  Yes [name], MD  amLODipine (NORVASC) 5 MG tablet Take 1 tablet (5 mg total) by mouth daily. 09/18/15  Yes [name], MD  olmesartan (BENICAR) 40 MG tablet Take 1 tablet (40 mg total) by mouth daily. 09/18/15  Yes [name], MD     Family History  Problem Relation Age of Onset  . Mental illness Mother 63    bipolar, dementia  . Cancer Maternal Aunt 70    breast ca  . Hypertension Father   . Stroke Father 79    deceased  . COPD Father   . Heart disease Sister     deceased    Social History   Social History  . Marital Status: Married    Spouse Name: N/A  . Number of Children: N/A  . Years of Education: N/A   Social History Main Topics  . Smoking status: Never Smoker   . Smokeless tobacco: Never Used  . Alcohol Use: 0.0 oz/week    0 Standard drinks or equivalent per week   Comment: occasionally  . Drug Use: No  . Sexual Activity: Not Currently   Other Topics Concern  . None   Social History Narrative    ECOG Status: 1 - Symptomatic but completely ambulatory  Review of Systems: A 12 point ROS discussed and pertinent positives are indicated in the HPI above.  All other systems are negative.  Review of Systems  Vital Signs: BP 120/77 mmHg  Pulse 82  Temp(Src) 98.6 F (37 C)  Resp 17  SpO2 100%  Physical Exam  Constitutional: She is oriented to person, place, and time. She appears well-developed and well-nourished. No distress.  Cardiovascular: Normal rate and regular rhythm.   No murmur heard. Pulmonary/Chest: Effort normal and breath sounds normal. No respiratory distress. She has no rales.  Abdominal: Soft. Bowel sounds are normal. She exhibits no distension and no mass. No hernia.  Neurological: She is alert and oriented to person, place, and time.  Skin: She is not diaphoretic.  Psychiatric: She has a normal mood and affect. Her behavior is normal.    Mallampati Score:   2  Imaging: Ct Abdomen Pelvis W Contrast  09/17/2015  CLINICAL DATA:  RIGHT mid abdominal pain for 1 month, indigestion, liver masses by ultrasound question hemangiomata EXAM: CT ABDOMEN AND PELVIS WITH CONTRAST TECHNIQUE: Multidetector CT imaging of the abdomen and pelvis was performed using the standard protocol following bolus administration of intravenous contrast. Sagittal and coronal MPR images reconstructed from axial data set. CONTRAST:  156mL ISOVUE-300 IOPAMIDOL (ISOVUE-300) INJECTION 61% IV. Dilute oral contrast. COMPARISON:  05/25/2010 ; correlation ultrasound abdomen 09/09/2015 FINDINGS: Lung bases clear. Multiple small hepatic cysts. Multiple new low-attenuation foci within liver, largest 8.4 x 5.4 x 5.9 cm lateral aspect RIGHT lobe image 16. None of these lesions demonstrate atypical enhancement pattern of hepatic hemangioma. These lesions are concerning for  hepatic metastases. Additional small lesion inferior aspect RIGHT lobe liver 16 x 11 mm image 41 unchanged since 2011, with enhancement characteristics at that time indicative of a small hepatic hemangioma. Gallbladder, spleen, pancreas, adrenal glands, and LEFT kidney normal appearance. RIGHT kidney demonstrates a lesion at the posterior aspect 2.6 x 1.6 x 1.8 cm image 34, containing foci of fat attenuation question small angiomyolipoma. Focal wall thickening at ascending colon immediately distal to ileocecal valve concerning for a colon neoplasm. Enlarged RIGHT ileocolic lymph nodes medial to ascending colon measuring up to 2.6 cm short axis. Mild sigmoid diverticulosis without evidence of diverticulitis. Remainder of colon unremarkable. Stomach and small bowel loops normal appearance. Bladder and ureters normal appearance. Uterus surgically absent with nonvisualization of ovaries and appendix. No free air, free fluid, or hernia. Osseous demineralization with degenerative disc disease changes greatest at L4-L5. 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 Colonoscopy recommended for further assessment. Additional small hepatic cysts and stable RIGHT lobe hepatic hemangioma. 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 These results will be called to the ordering clinician or representative by the Radiologist Assistant, and communication documented in the PACS or zVision Dashboard. Electronically Signed   By: Lavonia Dana M.D.   On: 09/17/2015 15:38   US Abdomen Limited Ruq  09/09/2015  CLINICAL DATA:  Recurrent right upper quadrant abdominal pain postprandially EXAM: US ABDOMEN LIMITED - RIGHT UPPER QUADRANT COMPARISON:  CT scan of the abdomen of Oct 23, 2009 FINDINGS: Gallbladder: No gallstones or wall thickening visualized. No  sonographic Murphy sign noted by sonographer. Common bile duct: Diameter: 5.2 mm Liver: The hepatic echotexture is heterogeneous. Hyperechoic masslike density in the right lobe measures 6.5 x 6 x 6 cm. A second area of increased echogenicity with surrounding decreased echogenicity in the right lobe measures 2.6 x 2.5 x 2.3 cm. There is a cyst in the right lobe measuring 1.4 cm in greatest dimension. There is a left lobe cyst measuring 1.5 cm in greatest dimension. IMPRESSION: Abnormal appearance of the liver worrisome for the presence of pathologic masses. These are new since the previous study. Hepatic protocol MRI or CT scanning is recommended. The gallbladder and common bile duct are unremarkable. These results will be called to the ordering clinician or representative by the Radiologist Assistant, and communication documented in the PACS or zVision Dashboard. Electronically Signed   By: David  Martinique M.D.   On: 09/09/2015 10:12    Labs:  CBC:  Recent Labs  08/20/15 0841 09/23/15 1504  WBC 4.6 4.7  HGB  --  12.3  HCT 37.5 36.2  PLT 358 314    COAGS:  Recent Labs  09/23/15 1504  INR  0.98  APTT 27    BMP:  Recent Labs  08/20/15 0841 09/10/15 0818 09/23/15 1504  NA 139 136 137  K 4.9 4.2 3.5  CL 102 97 106  CO2  --  23 28  GLUCOSE 91 97 106*  BUN 16 16 17   CALCIUM 9.4 9.1 8.9  CREATININE 1.03* 1.06* 1.01*  GFRNONAA 58* 55* 57*  GFRAA 66 64 >60    LIVER FUNCTION TESTS:  Recent Labs  08/20/15 0841 09/10/15 0818 09/23/15 1504  BILITOT 0.3 0.3 0.4  AST 22 22 25   ALT 22 24 20   ALKPHOS 79 87 70  PROT 7.5 7.9 8.4*  ALBUMIN 4.0 4.2 4.0    TUMOR MARKERS:  Recent Labs  09/23/15 1504  CEA 538.7*    Assessment and Plan:  Hepatic mets, suspect colon primary.  For US liver Bx today.  Risks and Benefits discussed with the patient including, but not limited to bleeding, infection, damage to adjacent structures or low yield requiring additional tests. All of  the patient's questions were answered, patient is agreeable to proceed. Consent signed and in chart.    Thank you for this interesting consult.  I greatly enjoyed meeting Kathleen James and look forward to participating in their care.  A copy of this report was sent to the requesting provider on this date.  Electronically Signed: Greggory Keen 09/26/2015, 10:04 AM   I spent a total of  30 Minutes   in face to face in clinical consultation, greater than 50% of which was counseling/coordinating care for this patient with hepatic lesions

## 2015-09-26 NOTE — Procedures (Signed)
Successful [place] bx of the Rt liver mass No comp Stable Full report in pacs paht pendnig

## 2015-09-29 ENCOUNTER — Encounter: Payer: Self-pay | Admitting: *Deleted

## 2015-09-29 ENCOUNTER — Telehealth: Payer: Self-pay | Admitting: Internal Medicine

## 2015-09-29 ENCOUNTER — Inpatient Hospital Stay: Admission: RE | Admit: 2015-09-29 | Payer: Self-pay | Source: Ambulatory Visit

## 2015-09-29 ENCOUNTER — Ambulatory Visit: Payer: Medicare HMO

## 2015-09-29 NOTE — Patient Instructions (Signed)
  Your procedure is scheduled on:10/01/15 Report to Day Surgery. To find out your arrival time please call [phone] between 1PM - 3PM on 09/30/15.  Remember: Instructions that are not followed completely may result in serious medical risk, up to and including death, or upon the discretion of your surgeon and anesthesiologist your surgery may need to be rescheduled.    _X___ 1. Do not eat food or drink liquids after midnight. No gum chewing or hard candies.     __X__ 2. No Alcohol for 24 hours before or after surgery.   ____ 3. Bring all medications with you on the day of surgery if instructed.    _X___ 4. Notify your doctor if there is any change in your medical condition     (cold, fever, infections).     Do not wear jewelry, make-up, hairpins, clips or nail polish.  Do not wear lotions, powders, or perfumes. You may wear deodorant.  Do not shave 48 hours prior to surgery. Men may shave face and neck.  Do not bring valuables to the hospital.    [facility] is not responsible for any belongings or valuables.               Contacts, dentures or bridgework may not be worn into surgery.  Leave your suitcase in the car. After surgery it may be brought to your room.  For patients admitted to the hospital, discharge time is determined by your                treatment team.   Patients discharged the day of surgery will not be allowed to drive home.   Please read over the following fact sheets that you were given:   Surgical Site Infection Prevention   ____ Take these medicines the morning of surgery with A SIP OF WATER:    1. ALPRAZLOLAM  2. NORVASC  3. BENICAR  4.  5.  6.  ____ Fleet Enema (as directed)   ____ Use CHG Soap as directed  ____ Use inhalers on the day of surgery  ____ Stop metformin 2 days prior to surgery    ____ Take 1/2 of usual insulin dose the night before surgery and none on the morning of surgery.   ____ Stop Coumadin/Plavix/aspirin on  ____ Stop  Anti-inflammatories on   ____ Stop supplements until after surgery.    ____ Bring C-Pap to the hospital.

## 2015-09-29 NOTE — Telephone Encounter (Signed)
Left message for patient to return call concerning FMLA for daughter.

## 2015-09-29 NOTE — Telephone Encounter (Signed)
Her daughter's FMLA form has been filled out the best i can, but I cannot predict the treatment plan  For Ms Kathleen James so Dr Rogue Bussing needs to fill out the rest if she is going to determine the treatment plan

## 2015-09-30 NOTE — Telephone Encounter (Signed)
Notified patient paper work has been faxed as requested.

## 2015-09-30 NOTE — Telephone Encounter (Signed)
Pt called stating she was returning your call. Call pt @ [phone]. Thank you!

## 2015-09-30 NOTE — Telephone Encounter (Signed)
FMLA paper work can be faxed over to  [employer] [phone]

## 2015-10-01 ENCOUNTER — Ambulatory Visit
Admission: RE | Admit: 2015-10-01 | Discharge: 2015-10-01 | Disposition: A | Discharge status: Home / Self Care | Payer: Medicare HMO | Source: Ambulatory Visit | Attending: General Surgery | Admitting: General Surgery

## 2015-10-01 ENCOUNTER — Ambulatory Visit: Payer: Medicare HMO | Admitting: Certified Registered Nurse Anesthetist

## 2015-10-01 ENCOUNTER — Ambulatory Visit: Payer: Medicare HMO

## 2015-10-01 ENCOUNTER — Encounter: Payer: Self-pay | Admitting: *Deleted

## 2015-10-01 ENCOUNTER — Encounter
Admission: RE | Disposition: A | Discharge status: Home / Self Care | Payer: Self-pay | Source: Ambulatory Visit | Attending: General Surgery

## 2015-10-01 ENCOUNTER — Other Ambulatory Visit: Payer: Self-pay | Admitting: Internal Medicine

## 2015-10-01 DIAGNOSIS — C799 Secondary malignant neoplasm of unspecified site: Secondary | ICD-10-CM | POA: Diagnosis not present

## 2015-10-01 DIAGNOSIS — Z885 Allergy status to narcotic agent status: Secondary | ICD-10-CM | POA: Insufficient documentation

## 2015-10-01 DIAGNOSIS — F419 Anxiety disorder, unspecified: Secondary | ICD-10-CM | POA: Insufficient documentation

## 2015-10-01 DIAGNOSIS — Z823 Family history of stroke: Secondary | ICD-10-CM | POA: Diagnosis not present

## 2015-10-01 DIAGNOSIS — Z79899 Other long term (current) drug therapy: Secondary | ICD-10-CM | POA: Diagnosis not present

## 2015-10-01 DIAGNOSIS — Z95828 Presence of other vascular implants and grafts: Secondary | ICD-10-CM

## 2015-10-01 DIAGNOSIS — Z8249 Family history of ischemic heart disease and other diseases of the circulatory system: Secondary | ICD-10-CM | POA: Diagnosis not present

## 2015-10-01 DIAGNOSIS — E669 Obesity, unspecified: Secondary | ICD-10-CM | POA: Insufficient documentation

## 2015-10-01 DIAGNOSIS — Z803 Family history of malignant neoplasm of breast: Secondary | ICD-10-CM | POA: Insufficient documentation

## 2015-10-01 DIAGNOSIS — Z90710 Acquired absence of both cervix and uterus: Secondary | ICD-10-CM | POA: Diagnosis not present

## 2015-10-01 DIAGNOSIS — M2040 Other hammer toe(s) (acquired), unspecified foot: Secondary | ICD-10-CM | POA: Insufficient documentation

## 2015-10-01 DIAGNOSIS — C182 Malignant neoplasm of ascending colon: Secondary | ICD-10-CM

## 2015-10-01 DIAGNOSIS — C801 Malignant (primary) neoplasm, unspecified: Secondary | ICD-10-CM | POA: Diagnosis not present

## 2015-10-01 DIAGNOSIS — C189 Malignant neoplasm of colon, unspecified: Secondary | ICD-10-CM | POA: Diagnosis not present

## 2015-10-01 DIAGNOSIS — Z825 Family history of asthma and other chronic lower respiratory diseases: Secondary | ICD-10-CM | POA: Diagnosis not present

## 2015-10-01 DIAGNOSIS — Z91030 Bee allergy status: Secondary | ICD-10-CM | POA: Insufficient documentation

## 2015-10-01 DIAGNOSIS — Z818 Family history of other mental and behavioral disorders: Secondary | ICD-10-CM | POA: Diagnosis not present

## 2015-10-01 DIAGNOSIS — R69 Illness, unspecified: Secondary | ICD-10-CM | POA: Diagnosis not present

## 2015-10-01 DIAGNOSIS — C180 Malignant neoplasm of cecum: Secondary | ICD-10-CM | POA: Diagnosis not present

## 2015-10-01 DIAGNOSIS — Z452 Encounter for adjustment and management of vascular access device: Secondary | ICD-10-CM | POA: Diagnosis not present

## 2015-10-01 DIAGNOSIS — I10 Essential (primary) hypertension: Secondary | ICD-10-CM | POA: Diagnosis not present

## 2015-10-01 DIAGNOSIS — Z6833 Body mass index (BMI) 33.0-33.9, adult: Secondary | ICD-10-CM | POA: Diagnosis not present

## 2015-10-01 HISTORY — PX: PORTACATH PLACEMENT: SHX2246

## 2015-10-01 SURGERY — INSERTION, TUNNELED CENTRAL VENOUS DEVICE, WITH PORT
Anesthesia: General | Laterality: Left | Wound class: Clean

## 2015-10-01 MED ORDER — CEFAZOLIN SODIUM-DEXTROSE 2-4 GM/100ML-% IV SOLN
2.0000 g | INTRAVENOUS | Status: AC
  Administered 2015-10-01: 2 g via INTRAVENOUS

## 2015-10-01 MED ORDER — CEFAZOLIN SODIUM-DEXTROSE 2-4 GM/100ML-% IV SOLN
INTRAVENOUS | Status: AC
  Administered 2015-10-01: 2 g via INTRAVENOUS
  Filled 2015-10-01: qty 100

## 2015-10-01 MED ORDER — SODIUM CHLORIDE 0.9 % IJ SOLN
INTRAMUSCULAR | Status: DC | PRN
  Administered 2015-10-01: 25 mL via INTRAVENOUS

## 2015-10-01 MED ORDER — ONDANSETRON HCL 4 MG/2ML IJ SOLN
INTRAMUSCULAR | Status: DC | PRN
  Administered 2015-10-01: 4 mg via INTRAVENOUS

## 2015-10-01 MED ORDER — ACETAMINOPHEN 325 MG PO TABS: 650.0000 mg | ORAL_TABLET | Freq: Four times a day (QID) | ORAL | Status: DC | PRN

## 2015-10-01 MED ORDER — BUPIVACAINE HCL (PF) 0.5 % IJ SOLN
INTRAMUSCULAR | Status: AC
  Filled 2015-10-01: qty 30

## 2015-10-01 MED ORDER — FENTANYL CITRATE (PF) 100 MCG/2ML IJ SOLN
INTRAMUSCULAR | Status: DC | PRN
  Administered 2015-10-01: 50 ug via INTRAVENOUS

## 2015-10-01 MED ORDER — LACTATED RINGERS IV SOLN
INTRAVENOUS | Status: DC
  Administered 2015-10-01 (×2): via INTRAVENOUS

## 2015-10-01 MED ORDER — FAMOTIDINE 20 MG PO TABS
ORAL_TABLET | ORAL | Status: AC
  Administered 2015-10-01: 20 mg via ORAL
  Filled 2015-10-01: qty 1

## 2015-10-01 MED ORDER — LIDOCAINE HCL (PF) 1 % IJ SOLN
INTRAMUSCULAR | Status: AC
  Filled 2015-10-01: qty 30

## 2015-10-01 MED ORDER — MIDAZOLAM HCL 2 MG/2ML IJ SOLN
INTRAMUSCULAR | Status: DC | PRN
  Administered 2015-10-01: 2 mg via INTRAVENOUS

## 2015-10-01 MED ORDER — PROPOFOL 500 MG/50ML IV EMUL
INTRAVENOUS | Status: DC | PRN
  Administered 2015-10-01: 150 ug/kg/min via INTRAVENOUS

## 2015-10-01 MED ORDER — LIDOCAINE HCL (PF) 1 % IJ SOLN
INTRAMUSCULAR | Status: DC | PRN
  Administered 2015-10-01: 10 mL via SUBCUTANEOUS

## 2015-10-01 MED ORDER — FENTANYL CITRATE (PF) 100 MCG/2ML IJ SOLN
INTRAMUSCULAR | Status: AC
  Filled 2015-10-01: qty 2

## 2015-10-01 MED ORDER — PROPOFOL 10 MG/ML IV BOLUS
INTRAVENOUS | Status: DC | PRN
  Administered 2015-10-01: 20 mg via INTRAVENOUS
  Administered 2015-10-01: 30 mg via INTRAVENOUS

## 2015-10-01 MED ORDER — FENTANYL CITRATE (PF) 100 MCG/2ML IJ SOLN
25.0000 ug | INTRAMUSCULAR | Status: DC | PRN
  Administered 2015-10-01 (×2): 50 ug via INTRAVENOUS

## 2015-10-01 MED ORDER — ONDANSETRON HCL 4 MG/2ML IJ SOLN: 4.0000 mg | Freq: Once | INTRAMUSCULAR | Status: DC | PRN

## 2015-10-01 MED ORDER — SODIUM CHLORIDE 0.9 % IJ SOLN
INTRAMUSCULAR | Status: AC
  Filled 2015-10-01: qty 50

## 2015-10-01 MED ORDER — LIDOCAINE HCL (CARDIAC) 20 MG/ML IV SOLN
INTRAVENOUS | Status: DC | PRN
  Administered 2015-10-01: 80 mg via INTRAVENOUS

## 2015-10-01 MED ORDER — FAMOTIDINE 20 MG PO TABS
20.0000 mg | ORAL_TABLET | Freq: Once | ORAL | Status: AC
  Administered 2015-10-01: 20 mg via ORAL

## 2015-10-01 MED ORDER — CEFAZOLIN SODIUM-DEXTROSE 2-3 GM-% IV SOLR
INTRAVENOUS | Status: DC | PRN
  Administered 2015-10-01: 2 g via INTRAVENOUS

## 2015-10-01 SURGICAL SUPPLY — 29 items
BLADE SURG 15 STRL SS SAFETY (BLADE) ×2 IMPLANT
CHLORAPREP W/TINT 26ML (MISCELLANEOUS) ×2 IMPLANT
COVER LIGHT HANDLE STERIS (MISCELLANEOUS) ×4 IMPLANT
DECANTER SPIKE VIAL GLASS SM (MISCELLANEOUS) ×2 IMPLANT
DRAPE C-ARM XRAY 36X54 (DRAPES) ×2 IMPLANT
DRAPE LAPAROTOMY TRNSV 106X77 (MISCELLANEOUS) ×2 IMPLANT
DRESSING TELFA 4X3 1S ST N-ADH (GAUZE/BANDAGES/DRESSINGS) ×2 IMPLANT
DRSG TEGADERM 2-3/8X2-3/4 SM (GAUZE/BANDAGES/DRESSINGS) ×2 IMPLANT
DRSG TEGADERM 4X4.75 (GAUZE/BANDAGES/DRESSINGS) ×2 IMPLANT
ELECT CAUTERY BLADE TIP 2.5 (TIP) ×2
ELECT REM PT RETURN 9FT ADLT (ELECTROSURGICAL) ×2
ELECTRODE CAUTERY BLDE TIP 2.5 (TIP) ×1 IMPLANT
ELECTRODE REM PT RTRN 9FT ADLT (ELECTROSURGICAL) ×1 IMPLANT
GLOVE BIO SURGEON STRL SZ7.5 (GLOVE) ×10 IMPLANT
GLOVE INDICATOR 8.0 STRL GRN (GLOVE) ×2 IMPLANT
GOWN STRL REUS W/ TWL LRG LVL3 (GOWN DISPOSABLE) ×2 IMPLANT
GOWN STRL REUS W/TWL LRG LVL3 (GOWN DISPOSABLE) ×2
KIT PORT POWER 8FR ISP CVUE (Catheter) ×2 IMPLANT
KIT RM TURNOVER STRD PROC AR (KITS) ×2 IMPLANT
LABEL OR SOLS (LABEL) ×2 IMPLANT
NS IRRIG 500ML POUR BTL (IV SOLUTION) ×2 IMPLANT
PACK PORT-A-CATH (MISCELLANEOUS) ×2 IMPLANT
STRIP CLOSURE SKIN 1/2X4 (GAUZE/BANDAGES/DRESSINGS) ×2 IMPLANT
SUT PROLENE 3 0 SH DA (SUTURE) ×2 IMPLANT
SUT VIC AB 3-0 SH 27 (SUTURE) ×1
SUT VIC AB 3-0 SH 27X BRD (SUTURE) ×1 IMPLANT
SUT VIC AB 4-0 FS2 27 (SUTURE) ×2 IMPLANT
SWABSTK COMLB BENZOIN TINCTURE (MISCELLANEOUS) ×2 IMPLANT
SYRINGE 10CC LL (SYRINGE) ×2 IMPLANT

## 2015-10-01 NOTE — Op Note (Signed)
Pre operative diagnosis: Metastatic colon cancer, need for central venous access.  Postoperative diagnosis: Same.  Procedure: Left subclavian PowerPort placement with ultrasound and fluoroscopic guidance.  Operative surgeon: Ollen Bowl, M.D.  Anesthesia: Monitored anesthesia care, 1% Xylocaine plain, 10 mL.  Estimated blood loss: Minimal.  Clinical note: This 65 year old woman has been diagnosed with metastatic colon cancer and is a candidate for chemotherapy. Central venous access has been requested by the treating oncologist.  Operative note: With the patient under adequate sedation the neck and chest was prepped with ChloraPrep and draped. The patient received Kefzol prior to the procedure. In Trendelenburg position these left subclavian vein was identified and under ultrasound guidance punctured on a single stick. The guidewire was advanced followed by the dilator. Fluoroscopic was used to position the catheter at the junction of the SVC and right atrium. This was tunneled to a pocket on the left anterior chest. The port was anchored to the deep tissue with interrupted 3-0 Prolene sutures. The catheter easily irrigated and aspirated and was flushed with 10 mL of injectable saline at the end of the procedure. The wound at the port site was closed in layers with 3-0 Vicryl for the adipose layer and running 4-0 Vicryl subcuticular suture for the skin. Benzoin, Steri-Strips, Telfa and Tegaderm dressing were applied.  Patient tolerated procedure well was taken to recovery in stable condition.

## 2015-10-01 NOTE — Transfer of Care (Signed)
Immediate Anesthesia Transfer of Care Note  Patient: Kathleen James  Procedure(s) Performed: Procedure(s): INSERTION PORT-A-CATH (Left)  Patient Location: PACU  Anesthesia Type:MAC  Level of Consciousness: awake  Airway & Oxygen Therapy: Patient Spontanous Breathing  Post-op Assessment: Report given to RN  Post vital signs: stable  Last Vitals:  Filed Vitals:   10/01/15 1119 10/01/15 1246  BP: 154/98 132/92  Pulse: 84 84  Temp: 36.8 C 36.7 C  Resp: 16 15    Complications: No apparent anesthesia complications

## 2015-10-01 NOTE — Anesthesia Preprocedure Evaluation (Signed)
Anesthesia Evaluation  Patient identified by MRN, date of birth, ID band Patient awake    Reviewed: Allergy & Precautions, H&P , NPO status , Patient's Chart, lab work & pertinent test results, reviewed documented beta blocker date and time   History of Anesthesia Complications (+) PONV and history of anesthetic complications  Airway Mallampati: II  TM Distance: >3 FB Neck ROM: full    Dental no notable dental hx. (+) Caps, Teeth Intact   Pulmonary neg pulmonary ROS,    Pulmonary exam normal breath sounds clear to auscultation       Cardiovascular Exercise Tolerance: Good hypertension, On Medications (-) angina(-) CAD, (-) Past MI, (-) Cardiac Stents and (-) CABG Normal cardiovascular exam(-) dysrhythmias (-) Valvular Problems/Murmurs Rhythm:regular Rate:Normal     Neuro/Psych PSYCHIATRIC DISORDERS (Anxiety) negative neurological ROS     GI/Hepatic GERD  ,Liver mass Colon cancer   Endo/Other  negative endocrine ROS  Renal/GU negative Renal ROS  negative genitourinary   Musculoskeletal   Abdominal   Peds  Hematology negative hematology ROS (+)   Anesthesia Other Findings Past Medical History:   Hypertension                                                 Obesity (BMI 30-39.9)                                        Angiolipoma of kidney                                          Comment:followed with serial CTs ,,  Dr. Scherrie Gerlach toe                                                   Arthritis                                                    Cancer [facility])                                                   Comment:colon   Reproductive/Obstetrics negative OB ROS                             Anesthesia Physical Anesthesia Plan  ASA: III  Anesthesia Plan: General   Post-op Pain Management:    Induction:   Airway Management Planned:   Additional Equipment:   Intra-op Plan:    Post-operative Plan:   Informed Consent: I have reviewed the patients History and Physical, chart, labs and discussed the procedure including the risks, benefits and alternatives for the proposed anesthesia with the patient or authorized representative who  has indicated his/her understanding and acceptance.   Dental Advisory Given  Plan Discussed with: Anesthesiologist, CRNA and Surgeon  Anesthesia Plan Comments:         Anesthesia Quick Evaluation

## 2015-10-01 NOTE — Progress Notes (Signed)
Chest X Ray done in PACU

## 2015-10-01 NOTE — H&P (Signed)
Metastatic colon cancer with need for central venous access.

## 2015-10-01 NOTE — Discharge Instructions (Signed)

## 2015-10-02 ENCOUNTER — Encounter (HOSPITAL_COMMUNITY): Payer: Medicare HMO

## 2015-10-02 NOTE — Patient Instructions (Signed)

## 2015-10-02 NOTE — Anesthesia Postprocedure Evaluation (Signed)
Anesthesia Post Note  Patient: Kathleen James  Procedure(s) Performed: Procedure(s) (LRB): INSERTION PORT-A-CATH (Left)  Patient location during evaluation: PACU Anesthesia Type: General Level of consciousness: awake and alert Pain management: pain level controlled Vital Signs Assessment: post-procedure vital signs reviewed and stable Respiratory status: spontaneous breathing, nonlabored ventilation, respiratory function stable and patient connected to nasal cannula oxygen Cardiovascular status: blood pressure returned to baseline and stable Postop Assessment: no signs of nausea or vomiting Anesthetic complications: no    Last Vitals:  Filed Vitals:   10/01/15 1405 10/01/15 1430  BP: 135/86 139/82  Pulse: 81 80  Temp:    Resp: 16 16    Last Pain:  Filed Vitals:   10/01/15 1434  PainSc: 0-No pain                 Martha Clan

## 2015-10-03 ENCOUNTER — Other Ambulatory Visit: Payer: Self-pay | Admitting: Internal Medicine

## 2015-10-03 ENCOUNTER — Telehealth: Payer: Self-pay | Admitting: Internal Medicine

## 2015-10-03 ENCOUNTER — Telehealth: Payer: Self-pay | Admitting: General Surgery

## 2015-10-03 LAB — SURGICAL PATHOLOGY

## 2015-10-03 NOTE — Telephone Encounter (Signed)
The patient was notified that the results of her recently completed liver biopsy did confirm cancer, but there is a concern that there may be a difference between the cancer in the liver and the cancer in the colon. Tissue from the colon has been requested. This will necessitate a colonoscopy, which we thought we were going to avoid.  The patient has been asked to stop by my office on Monday, April 17 after her PET scan to get this scheduled for next week.

## 2015-10-03 NOTE — Telephone Encounter (Signed)
I tried to reach the patient re: the biopsy need for colonoscopy/did not leave a voice mail.

## 2015-10-06 ENCOUNTER — Ambulatory Visit: Payer: Medicare HMO | Admitting: General Surgery

## 2015-10-06 ENCOUNTER — Telehealth: Payer: Self-pay | Admitting: *Deleted

## 2015-10-06 ENCOUNTER — Other Ambulatory Visit: Payer: Self-pay | Admitting: General Surgery

## 2015-10-06 ENCOUNTER — Ambulatory Visit
Admission: RE | Admit: 2015-10-06 | Discharge: 2015-10-06 | Disposition: A | Discharge status: Home / Self Care | Payer: Medicare HMO | Source: Ambulatory Visit | Attending: Internal Medicine | Admitting: Internal Medicine

## 2015-10-06 ENCOUNTER — Inpatient Hospital Stay: Payer: Medicare HMO | Admitting: Internal Medicine

## 2015-10-06 DIAGNOSIS — C189 Malignant neoplasm of colon, unspecified: Secondary | ICD-10-CM | POA: Diagnosis not present

## 2015-10-06 DIAGNOSIS — C7A8 Other malignant neuroendocrine tumors: Secondary | ICD-10-CM | POA: Diagnosis not present

## 2015-10-06 DIAGNOSIS — Z90710 Acquired absence of both cervix and uterus: Secondary | ICD-10-CM | POA: Diagnosis not present

## 2015-10-06 DIAGNOSIS — M1990 Unspecified osteoarthritis, unspecified site: Secondary | ICD-10-CM | POA: Diagnosis not present

## 2015-10-06 DIAGNOSIS — Z79899 Other long term (current) drug therapy: Secondary | ICD-10-CM | POA: Diagnosis not present

## 2015-10-06 DIAGNOSIS — Z825 Family history of asthma and other chronic lower respiratory diseases: Secondary | ICD-10-CM | POA: Diagnosis not present

## 2015-10-06 DIAGNOSIS — Z91030 Bee allergy status: Secondary | ICD-10-CM | POA: Diagnosis not present

## 2015-10-06 DIAGNOSIS — Z818 Family history of other mental and behavioral disorders: Secondary | ICD-10-CM | POA: Diagnosis not present

## 2015-10-06 DIAGNOSIS — Z8249 Family history of ischemic heart disease and other diseases of the circulatory system: Secondary | ICD-10-CM | POA: Diagnosis not present

## 2015-10-06 DIAGNOSIS — R69 Illness, unspecified: Secondary | ICD-10-CM | POA: Diagnosis not present

## 2015-10-06 DIAGNOSIS — F419 Anxiety disorder, unspecified: Secondary | ICD-10-CM | POA: Diagnosis not present

## 2015-10-06 DIAGNOSIS — Z823 Family history of stroke: Secondary | ICD-10-CM | POA: Diagnosis not present

## 2015-10-06 DIAGNOSIS — I10 Essential (primary) hypertension: Secondary | ICD-10-CM | POA: Diagnosis not present

## 2015-10-06 DIAGNOSIS — K6389 Other specified diseases of intestine: Secondary | ICD-10-CM

## 2015-10-06 DIAGNOSIS — E669 Obesity, unspecified: Secondary | ICD-10-CM | POA: Diagnosis not present

## 2015-10-06 DIAGNOSIS — Z6833 Body mass index (BMI) 33.0-33.9, adult: Secondary | ICD-10-CM | POA: Diagnosis not present

## 2015-10-06 DIAGNOSIS — C182 Malignant neoplasm of ascending colon: Secondary | ICD-10-CM

## 2015-10-06 DIAGNOSIS — R933 Abnormal findings on diagnostic imaging of other parts of digestive tract: Secondary | ICD-10-CM | POA: Diagnosis present

## 2015-10-06 DIAGNOSIS — K5730 Diverticulosis of large intestine without perforation or abscess without bleeding: Secondary | ICD-10-CM | POA: Diagnosis not present

## 2015-10-06 DIAGNOSIS — Z885 Allergy status to narcotic agent status: Secondary | ICD-10-CM | POA: Diagnosis not present

## 2015-10-06 DIAGNOSIS — Z803 Family history of malignant neoplasm of breast: Secondary | ICD-10-CM | POA: Diagnosis not present

## 2015-10-06 DIAGNOSIS — C787 Secondary malignant neoplasm of liver and intrahepatic bile duct: Secondary | ICD-10-CM | POA: Diagnosis not present

## 2015-10-06 LAB — GLUCOSE, CAPILLARY: GLUCOSE-CAPILLARY: 68 mg/dL (ref 65–99)

## 2015-10-06 MED ORDER — FLUDEOXYGLUCOSE F - 18 (FDG) INJECTION
11.5400 | Freq: Once | INTRAVENOUS | Status: AC | PRN
  Administered 2015-10-06: 11.54 via INTRAVENOUS

## 2015-10-06 MED ORDER — POLYETHYLENE GLYCOL 3350 17 GM/SCOOP PO POWD: ORAL | Status: DC

## 2015-10-06 NOTE — H&P (Signed)
HPI Kathleen James is a 65 y.o. female. Here today for evaluation of abdominal pain. The pain that been described in both the right upper below the rib cage as well as the right mid abdomen. She states this has been going on for a month now. He symptomatic for the last week. Salad trigger some pain. Daily bowel movements. Initial suspicion was for biliary disease but ultrasound showed no gallstones but multiple hepatic lesions.CT scan showed a colon mass on 09/16/15 as well as confirmed multiple lesions within the liver compatible with metastatic disease.   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  The patient is accompanied by her daughter who is a coronary care nurse in [place] as well as her husband. Both her present for the interview and exam.  I personally reviewed the patient's history.  HPI  Past Medical History  Diagnosis Date  . Hypertension   . Obesity (BMI 30-39.9)   . Angiolipoma of kidney     followed with serial CTs ,, Dr. Jacqlyn Larsen  . Hammer toe     Past Surgical History  Procedure Laterality Date  . Abdominal hysterectomy  1995    endometriosis, heavy bleeding  . Oophorectomy    . Rotator cuff repair Right 2008    right shoulder. [place]   . Cesarean section  1985    Family History  Problem Relation Age of Onset  . Mental illness Mother 64    bipolar, dementia  . Cancer Maternal Aunt 70    breast ca  . Hypertension Father   . Stroke Father 81    deceased  . COPD Father   . Heart disease Sister     deceased    Social History Social History  Substance Use Topics  . Smoking status: Never Smoker   . Smokeless tobacco: Never Used  . Alcohol Use: 0.0 oz/week    0 Standard drinks or equivalent per week      Comment: occasionally    Allergies  Allergen Reactions  . Bee Venom   . Morphine And Related Nausea Only    Current Outpatient Prescriptions  Medication Sig Dispense Refill  . ALPRAZolam (XANAX) 0.25 MG tablet Take 1 tablet (0.25 mg total) by mouth daily as needed for sleep or anxiety. 90 tablet 5  . amLODipine (NORVASC) 5 MG tablet Take 1 tablet (5 mg total) by mouth daily. 30 tablet 3  . olmesartan (BENICAR) 40 MG tablet Take 1 tablet (40 mg total) by mouth daily. 30 tablet 2   No current facility-administered medications for this visit.    Review of Systems Review of Systems  Constitutional: Negative for unexpected weight change.  HENT: Negative.  Eyes: Negative.  Respiratory: Negative.  Cardiovascular: Negative.  Gastrointestinal: Positive for abdominal pain. Negative for nausea and vomiting.  Endocrine: Negative.  Genitourinary: Negative.  Allergic/Immunologic: Negative.  Neurological: Negative.  Hematological: Negative.  Psychiatric/Behavioral: Negative.    Blood pressure 124/78, pulse 82, resp. rate 14, height 5\' 7"  (1.702 m), weight 201 lb (91.173 kg).  Physical Exam Physical Exam  Constitutional: She is oriented to person, place, and time. She appears well-developed and well-nourished.  Eyes: Conjunctivae are normal. No scleral icterus.  Neck: Neck supple.  Cardiovascular: Normal rate, regular rhythm and normal heart sounds.  Pulmonary/Chest: Effort normal and breath sounds normal.  Abdominal: Soft. Bowel sounds are normal. There is no tenderness.  Lymphadenopathy:   She has no cervical adenopathy.   Left: No supraclavicular adenopathy present.  Neurological: She is alert and oriented to person, place, and time.  Skin: Skin is warm and dry.    Data Reviewed CT scan of the abdomen and pelvis did 09/17/2015 wasn't apparently reviewed.  Multiple right lobe hepatic metastases. Prior ultrasound showed  evidence of by lobar cyst. Mass in the ascending colon consistent with possible malignancy.  08/20/2015 CBC incomprehensible metabolic bowel notable only for a depressed GFR of 55. Normal hemoglobin and MCV.  Assessment    Metastatic colon cancer.    Plan    The patient is scheduled for medical oncology evaluation this afternoon. Further treatment plans will take place after that assessment.    Case review with Dr. Rogue Bussing after her evaluation this afternoon. He plans for liver biopsy to confirm the clinical impression of metastatic colon cancer as well as PET scan to determine full extent of disease. Neoadjuvant chemotherapy is planned with consideration for possible right hepatectomy/right colon resection depending on response. With no indication of impending colonic obstruction, resection of the primary tumor will be deferred.  No indication for colonoscopy at this time, may be indicated if the patient becomes a candidate for elective right colectomy to confirm no additional disease.  The risks associated with central venous access including arterial, pulmonary and venous injury were reviewed. The possible need for additional treatment if pulmonary injury occurs (chest tube placement) was discussed.   Discussed port placement with patient, patient agrees. PCP: Deborra Medina  This information has been scribed by Karie Fetch RN, BSN,BC.    Robert Bellow     The patient was notified that the results of her recently completed liver biopsy did confirm cancer, but there is a concern that there may be a difference between the cancer in the liver and the cancer in the colon. Tissue from the colon has been requested. This will necessitate a colonoscopy, which we thought we were going to avoid.  The patient has been asked to stop by my office on Monday, April 17 after her PET scan to get this scheduled for next week.

## 2015-10-06 NOTE — Telephone Encounter (Signed)
Patient has been scheduled for a colonoscopy on 10-09-15 at [facility]. Instructions will be reviewed at appointment later today.

## 2015-10-06 NOTE — Telephone Encounter (Signed)
-----   Message from Robert Bellow, MD sent at 10/03/2015  5:52 PM EDT ----- The patient has been asked to stop by the office on Monday, April 17 after her PET scan. Please get her scheduled for a colonoscopy on Thursday afternoon. There looks to be time. Thank you

## 2015-10-07 ENCOUNTER — Encounter: Payer: Self-pay | Admitting: Internal Medicine

## 2015-10-07 ENCOUNTER — Inpatient Hospital Stay: Payer: Medicare HMO

## 2015-10-07 ENCOUNTER — Inpatient Hospital Stay (HOSPITAL_BASED_OUTPATIENT_CLINIC_OR_DEPARTMENT_OTHER): Payer: Medicare HMO | Admitting: Internal Medicine

## 2015-10-07 ENCOUNTER — Other Ambulatory Visit: Payer: Self-pay | Admitting: *Deleted

## 2015-10-07 VITALS — BP 132/84 | HR 94 | Temp 96.6°F | Resp 18 | Ht 64.0 in | Wt 203.5 lb

## 2015-10-07 DIAGNOSIS — Z5111 Encounter for antineoplastic chemotherapy: Secondary | ICD-10-CM | POA: Diagnosis not present

## 2015-10-07 DIAGNOSIS — I10 Essential (primary) hypertension: Secondary | ICD-10-CM

## 2015-10-07 DIAGNOSIS — T451X5A Adverse effect of antineoplastic and immunosuppressive drugs, initial encounter: Principal | ICD-10-CM

## 2015-10-07 DIAGNOSIS — C799 Secondary malignant neoplasm of unspecified site: Secondary | ICD-10-CM

## 2015-10-07 DIAGNOSIS — K5730 Diverticulosis of large intestine without perforation or abscess without bleeding: Secondary | ICD-10-CM

## 2015-10-07 DIAGNOSIS — R590 Localized enlarged lymph nodes: Secondary | ICD-10-CM | POA: Diagnosis not present

## 2015-10-07 DIAGNOSIS — C7A022 Malignant carcinoid tumor of the ascending colon: Secondary | ICD-10-CM

## 2015-10-07 DIAGNOSIS — Z79899 Other long term (current) drug therapy: Secondary | ICD-10-CM

## 2015-10-07 DIAGNOSIS — C189 Malignant neoplasm of colon, unspecified: Secondary | ICD-10-CM

## 2015-10-07 DIAGNOSIS — R109 Unspecified abdominal pain: Secondary | ICD-10-CM

## 2015-10-07 DIAGNOSIS — M1990 Unspecified osteoarthritis, unspecified site: Secondary | ICD-10-CM

## 2015-10-07 DIAGNOSIS — C7B02 Secondary carcinoid tumors of liver: Secondary | ICD-10-CM

## 2015-10-07 DIAGNOSIS — N289 Disorder of kidney and ureter, unspecified: Secondary | ICD-10-CM | POA: Diagnosis not present

## 2015-10-07 DIAGNOSIS — K521 Toxic gastroenteritis and colitis: Secondary | ICD-10-CM

## 2015-10-07 DIAGNOSIS — Z95828 Presence of other vascular implants and grafts: Secondary | ICD-10-CM

## 2015-10-07 DIAGNOSIS — C7A8 Other malignant neuroendocrine tumors: Secondary | ICD-10-CM

## 2015-10-07 DIAGNOSIS — R112 Nausea with vomiting, unspecified: Secondary | ICD-10-CM

## 2015-10-07 MED ORDER — DIPHENOXYLATE-ATROPINE 2.5-0.025 MG PO TABS: 1.0000 | ORAL_TABLET | Freq: Four times a day (QID) | ORAL | Status: DC | PRN

## 2015-10-07 MED ORDER — ONDANSETRON HCL 8 MG PO TABS: 8.0000 mg | ORAL_TABLET | Freq: Three times a day (TID) | ORAL | Status: DC | PRN

## 2015-10-07 MED ORDER — PROCHLORPERAZINE MALEATE 10 MG PO TABS: 10.0000 mg | ORAL_TABLET | Freq: Four times a day (QID) | ORAL | Status: DC | PRN

## 2015-10-07 MED ORDER — LIDOCAINE-PRILOCAINE 2.5-2.5 % EX CREA: 1.0000 "application " | TOPICAL_CREAM | CUTANEOUS | Status: DC | PRN

## 2015-10-07 NOTE — Progress Notes (Signed)
Called prescription for Lomotil into [facility] per Dr. Rogue Bussing. Patient aware prescription has been called in.

## 2015-10-07 NOTE — Patient Instructions (Signed)
FOLFIRINOX   FOL = Leucovorin Calcium (Folinic Acid) F = Fluorouracil IRIN = Irinotecan Hydrochloride OX = Oxaliplatin       Leucovorin injection What is this medicine? LEUCOVORIN (loo koe VOR in) is used to prevent or treat the harmful effects of some medicines. This medicine is used to treat anemia caused by a low amount of folic acid in the body. It is also used with 5-fluorouracil (5-FU) to treat colon cancer. This medicine may be used for other purposes; ask your health care provider or pharmacist if you have questions. What should I tell my health care provider before I take this medicine? They need to know if you have any of these conditions: -anemia from low levels of vitamin B-12 in the blood -an unusual or allergic reaction to leucovorin, folic acid, other medicines, foods, dyes, or preservatives -pregnant or trying to get pregnant -breast-feeding How should I use this medicine? This medicine is for injection into a muscle or into a vein. It is given by a health care professional in a hospital or clinic setting. Talk to your pediatrician regarding the use of this medicine in children. Special care may be needed. Overdosage: If you think you have taken too much of this medicine contact a poison control center or emergency room at once. NOTE: This medicine is only for you. Do not share this medicine with others. What if I miss a dose? This does not apply. What may interact with this medicine? -capecitabine -fluorouracil -phenobarbital -phenytoin -primidone -trimethoprim-sulfamethoxazole This list may not describe all possible interactions. Give your health care provider a list of all the medicines, herbs, non-prescription drugs, or dietary supplements you use. Also tell them if you smoke, drink alcohol, or use illegal drugs. Some items may interact with your medicine. What should I watch for while using this medicine? Your condition will be monitored carefully while you are  receiving this medicine. This medicine may increase the side effects of 5-fluorouracil, 5-FU. Tell your doctor or health care professional if you have diarrhea or mouth sores that do not get better or that get worse. What side effects may I notice from receiving this medicine? Side effects that you should report to your doctor or health care professional as soon as possible: -allergic reactions like skin rash, itching or hives, swelling of the face, lips, or tongue -breathing problems -fever, infection -mouth sores -unusual bleeding or bruising -unusually weak or tired Side effects that usually do not require medical attention (report to your doctor or health care professional if they continue or are bothersome): -constipation or diarrhea -loss of appetite -nausea, vomiting This list may not describe all possible side effects. Call your doctor for medical advice about side effects. You may report side effects to FDA at 1-800-FDA-1088. Where should I keep my medicine? This drug is given in a hospital or clinic and will not be stored at home. NOTE: This sheet is a summary. It may not cover all possible information. If you have questions about this medicine, talk to your doctor, pharmacist, or health care provider.    2016, [employer] Standard. (2007-12-12 16:50:29)    Fluorouracil, 5-FU injection What is this medicine? FLUOROURACIL, 5-FU (flure oh YOOR a sil) is a chemotherapy drug. It slows the growth of cancer cells. This medicine is used to treat many types of cancer like breast cancer, colon or rectal cancer, pancreatic cancer, and stomach cancer. This medicine may be used for other purposes; ask your health care provider or pharmacist if you  have questions. What should I tell my health care provider before I take this medicine? They need to know if you have any of these conditions: -blood disorders -dihydropyrimidine dehydrogenase (DPD) deficiency -infection (especially a virus  infection such as chickenpox, cold sores, or herpes) -kidney disease -liver disease -malnourished, poor nutrition -recent or ongoing radiation therapy -an unusual or allergic reaction to fluorouracil, other chemotherapy, other medicines, foods, dyes, or preservatives -pregnant or trying to get pregnant -breast-feeding How should I use this medicine? This drug is given as an infusion or injection into a vein. It is administered in a hospital or clinic by a specially trained health care professional. Talk to your pediatrician regarding the use of this medicine in children. Special care may be needed. Overdosage: If you think you have taken too much of this medicine contact a poison control center or emergency room at once. NOTE: This medicine is only for you. Do not share this medicine with others. What if I miss a dose? It is important not to miss your dose. Call your doctor or health care professional if you are unable to keep an appointment. What may interact with this medicine? -allopurinol -cimetidine -dapsone -digoxin -hydroxyurea -leucovorin -levamisole -medicines for seizures like ethotoin, fosphenytoin, phenytoin -medicines to increase blood counts like filgrastim, pegfilgrastim, sargramostim -medicines that treat or prevent blood clots like warfarin, enoxaparin, and dalteparin -methotrexate -metronidazole -pyrimethamine -some other chemotherapy drugs like busulfan, cisplatin, estramustine, vinblastine -trimethoprim -trimetrexate -vaccines Talk to your doctor or health care professional before taking any of these medicines: -acetaminophen -aspirin -ibuprofen -ketoprofen -naproxen This list may not describe all possible interactions. Give your health care provider a list of all the medicines, herbs, non-prescription drugs, or dietary supplements you use. Also tell them if you smoke, drink alcohol, or use illegal drugs. Some items may interact with your medicine. What  should I watch for while using this medicine? Visit your doctor for checks on your progress. This drug may make you feel generally unwell. This is not uncommon, as chemotherapy can affect healthy cells as well as cancer cells. Report any side effects. Continue your course of treatment even though you feel ill unless your doctor tells you to stop. In some cases, you may be given additional medicines to help with side effects. Follow all directions for their use. Call your doctor or health care professional for advice if you get a fever, chills or sore throat, or other symptoms of a cold or flu. Do not treat yourself. This drug decreases your body's ability to fight infections. Try to avoid being around people who are sick. This medicine may increase your risk to bruise or bleed. Call your doctor or health care professional if you notice any unusual bleeding. Be careful brushing and flossing your teeth or using a toothpick because you may get an infection or bleed more easily. If you have any dental work done, tell your dentist you are receiving this medicine. Avoid taking products that contain aspirin, acetaminophen, ibuprofen, naproxen, or ketoprofen unless instructed by your doctor. These medicines may hide a fever. Do not become pregnant while taking this medicine. Women should inform their doctor if they wish to become pregnant or think they might be pregnant. There is a potential for serious side effects to an unborn child. Talk to your health care professional or pharmacist for more information. Do not breast-feed an infant while taking this medicine. Men should inform their doctor if they wish to father a child. This medicine may lower sperm  counts. Do not treat diarrhea with over the counter products. Contact your doctor if you have diarrhea that lasts more than 2 days or if it is severe and watery. This medicine can make you more sensitive to the sun. Keep out of the sun. If you cannot avoid being  in the sun, wear protective clothing and use sunscreen. Do not use sun lamps or tanning beds/booths. What side effects may I notice from receiving this medicine? Side effects that you should report to your doctor or health care professional as soon as possible: -allergic reactions like skin rash, itching or hives, swelling of the face, lips, or tongue -low blood counts - this medicine may decrease the number of white blood cells, red blood cells and platelets. You may be at increased risk for infections and bleeding. -signs of infection - fever or chills, cough, sore throat, pain or difficulty passing urine -signs of decreased platelets or bleeding - bruising, pinpoint red spots on the skin, black, tarry stools, blood in the urine -signs of decreased red blood cells - unusually weak or tired, fainting spells, lightheadedness -breathing problems -changes in vision -chest pain -mouth sores -nausea and vomiting -pain, swelling, redness at site where injected -pain, tingling, numbness in the hands or feet -redness, swelling, or sores on hands or feet -stomach pain -unusual bleeding Side effects that usually do not require medical attention (report to your doctor or health care professional if they continue or are bothersome): -changes in finger or toe nails -diarrhea -dry or itchy skin -hair loss -headache -loss of appetite -sensitivity of eyes to the light -stomach upset -unusually teary eyes This list may not describe all possible side effects. Call your doctor for medical advice about side effects. You may report side effects to FDA at 1-800-FDA-1088. Where should I keep my medicine? This drug is given in a hospital or clinic and will not be stored at home. NOTE: This sheet is a summary. It may not cover all possible information. If you have questions about this medicine, talk to your doctor, pharmacist, or health care provider.    2016, [employer] Standard. (2007-10-11  13:53:16)   Irinotecan injection What is this medicine? IRINOTECAN (ir in oh TEE kan ) is a chemotherapy drug. It is used to treat colon and rectal cancer. This medicine may be used for other purposes; ask your health care provider or pharmacist if you have questions. What should I tell my health care provider before I take this medicine? They need to know if you have any of these conditions: -blood disorders -dehydration -diarrhea -infection (especially a virus infection such as chickenpox, cold sores, or herpes) -liver disease -low blood counts, like low white cell, platelet, or red cell counts -recent or ongoing radiation therapy -an unusual or allergic reaction to irinotecan, sorbitol, other chemotherapy, other medicines, foods, dyes, or preservatives -pregnant or trying to get pregnant -breast-feeding How should I use this medicine? This drug is given as an infusion into a vein. It is administered in a hospital or clinic by a specially trained health care professional. Talk to your pediatrician regarding the use of this medicine in children. Special care may be needed. Overdosage: If you think you have taken too much of this medicine contact a poison control center or emergency room at once. NOTE: This medicine is only for you. Do not share this medicine with others. What if I miss a dose? It is important not to miss your dose. Call your doctor or health care professional  if you are unable to keep an appointment. What may interact with this medicine? Do not take this medicine with any of the following medications: -atazanavir -certain medicines for fungal infections like itraconazole and ketoconazole -St. John's Wort This medicine may also interact with the following medications: -dexamethasone -diuretics -laxatives -medicines for seizures like carbamazepine, mephobarbital, phenobarbital, phenytoin, primidone -medicines to increase blood counts like filgrastim, pegfilgrastim,  sargramostim -prochlorperazine -vaccines This list may not describe all possible interactions. Give your health care provider a list of all the medicines, herbs, non-prescription drugs, or dietary supplements you use. Also tell them if you smoke, drink alcohol, or use illegal drugs. Some items may interact with your medicine. What should I watch for while using this medicine? Your condition will be monitored carefully while you are receiving this medicine. You will need important blood work done while you are taking this medicine. This drug may make you feel generally unwell. This is not uncommon, as chemotherapy can affect healthy cells as well as cancer cells. Report any side effects. Continue your course of treatment even though you feel ill unless your doctor tells you to stop. In some cases, you may be given additional medicines to help with side effects. Follow all directions for their use. You may get drowsy or dizzy. Do not drive, use machinery, or do anything that needs mental alertness until you know how this medicine affects you. Do not stand or sit up quickly, especially if you are an older patient. This reduces the risk of dizzy or fainting spells. Call your doctor or health care professional for advice if you get a fever, chills or sore throat, or other symptoms of a cold or flu. Do not treat yourself. This drug decreases your body's ability to fight infections. Try to avoid being around people who are sick. This medicine may increase your risk to bruise or bleed. Call your doctor or health care professional if you notice any unusual bleeding. Be careful brushing and flossing your teeth or using a toothpick because you may get an infection or bleed more easily. If you have any dental work done, tell your dentist you are receiving this medicine. Avoid taking products that contain aspirin, acetaminophen, ibuprofen, naproxen, or ketoprofen unless instructed by your doctor. These medicines may  hide a fever. Do not become pregnant while taking this medicine. Women should inform their doctor if they wish to become pregnant or think they might be pregnant. There is a potential for serious side effects to an unborn child. Talk to your health care professional or pharmacist for more information. Do not breast-feed an infant while taking this medicine. What side effects may I notice from receiving this medicine? Side effects that you should report to your doctor or health care professional as soon as possible: -allergic reactions like skin rash, itching or hives, swelling of the face, lips, or tongue -low blood counts - this medicine may decrease the number of white blood cells, red blood cells and platelets. You may be at increased risk for infections and bleeding. -signs of infection - fever or chills, cough, sore throat, pain or difficulty passing urine -signs of decreased platelets or bleeding - bruising, pinpoint red spots on the skin, black, tarry stools, blood in the urine -signs of decreased red blood cells - unusually weak or tired, fainting spells, lightheadedness -breathing problems -chest pain -diarrhea -feeling faint or lightheaded, falls -flushing, runny nose, sweating during infusion -mouth sores or pain -pain, swelling, redness or irritation where injected -pain,  swelling, warmth in the leg -pain, tingling, numbness in the hands or feet -problems with balance, talking, walking -stomach cramps, pain -trouble passing urine or change in the amount of urine -vomiting as to be unable to hold down drinks or food -yellowing of the eyes or skin Side effects that usually do not require medical attention (report to your doctor or health care professional if they continue or are bothersome): -constipation -hair loss -headache -loss of appetite -nausea, vomiting -stomach upset This list may not describe all possible side effects. Call your doctor for medical advice about side  effects. You may report side effects to FDA at 1-800-FDA-1088. Where should I keep my medicine? This drug is given in a hospital or clinic and will not be stored at home. NOTE: This sheet is a summary. It may not cover all possible information. If you have questions about this medicine, talk to your doctor, pharmacist, or health care provider.    2016, [employer] Standard. (2012-12-04 16:29:32)     Oxaliplatin Injection What is this medicine? OXALIPLATIN (ox AL i PLA tin) is a chemotherapy drug. It targets fast dividing cells, like cancer cells, and causes these cells to die. This medicine is used to treat cancers of the colon and rectum, and many other cancers. This medicine may be used for other purposes; ask your health care provider or pharmacist if you have questions. What should I tell my health care provider before I take this medicine? They need to know if you have any of these conditions: -kidney disease -an unusual or allergic reaction to oxaliplatin, other chemotherapy, other medicines, foods, dyes, or preservatives -pregnant or trying to get pregnant -breast-feeding How should I use this medicine? This drug is given as an infusion into a vein. It is administered in a hospital or clinic by a specially trained health care professional. Talk to your pediatrician regarding the use of this medicine in children. Special care may be needed. Overdosage: If you think you have taken too much of this medicine contact a poison control center or emergency room at once. NOTE: This medicine is only for you. Do not share this medicine with others. What if I miss a dose? It is important not to miss a dose. Call your doctor or health care professional if you are unable to keep an appointment. What may interact with this medicine? -medicines to increase blood counts like filgrastim, pegfilgrastim, sargramostim -probenecid -some antibiotics like amikacin, gentamicin, neomycin, polymyxin B,  streptomycin, tobramycin -zalcitabine Talk to your doctor or health care professional before taking any of these medicines: -acetaminophen -aspirin -ibuprofen -ketoprofen -naproxen This list may not describe all possible interactions. Give your health care provider a list of all the medicines, herbs, non-prescription drugs, or dietary supplements you use. Also tell them if you smoke, drink alcohol, or use illegal drugs. Some items may interact with your medicine. What should I watch for while using this medicine? Your condition will be monitored carefully while you are receiving this medicine. You will need important blood work done while you are taking this medicine. This medicine can make you more sensitive to cold. Do not drink cold drinks or use ice. Cover exposed skin before coming in contact with cold temperatures or cold objects. When out in cold weather wear warm clothing and cover your mouth and nose to warm the air that goes into your lungs. Tell your doctor if you get sensitive to the cold. This drug may make you feel generally unwell. This is not  uncommon, as chemotherapy can affect healthy cells as well as cancer cells. Report any side effects. Continue your course of treatment even though you feel ill unless your doctor tells you to stop. In some cases, you may be given additional medicines to help with side effects. Follow all directions for their use. Call your doctor or health care professional for advice if you get a fever, chills or sore throat, or other symptoms of a cold or flu. Do not treat yourself. This drug decreases your body's ability to fight infections. Try to avoid being around people who are sick. This medicine may increase your risk to bruise or bleed. Call your doctor or health care professional if you notice any unusual bleeding. Be careful brushing and flossing your teeth or using a toothpick because you may get an infection or bleed more easily. If you have any  dental work done, tell your dentist you are receiving this medicine. Avoid taking products that contain aspirin, acetaminophen, ibuprofen, naproxen, or ketoprofen unless instructed by your doctor. These medicines may hide a fever. Do not become pregnant while taking this medicine. Women should inform their doctor if they wish to become pregnant or think they might be pregnant. There is a potential for serious side effects to an unborn child. Talk to your health care professional or pharmacist for more information. Do not breast-feed an infant while taking this medicine. Call your doctor or health care professional if you get diarrhea. Do not treat yourself. What side effects may I notice from receiving this medicine? Side effects that you should report to your doctor or health care professional as soon as possible: -allergic reactions like skin rash, itching or hives, swelling of the face, lips, or tongue -low blood counts - This drug may decrease the number of white blood cells, red blood cells and platelets. You may be at increased risk for infections and bleeding. -signs of infection - fever or chills, cough, sore throat, pain or difficulty passing urine -signs of decreased platelets or bleeding - bruising, pinpoint red spots on the skin, black, tarry stools, nosebleeds -signs of decreased red blood cells - unusually weak or tired, fainting spells, lightheadedness -breathing problems -chest pain, pressure -cough -diarrhea -jaw tightness -mouth sores -nausea and vomiting -pain, swelling, redness or irritation at the injection site -pain, tingling, numbness in the hands or feet -problems with balance, talking, walking -redness, blistering, peeling or loosening of the skin, including inside the mouth -trouble passing urine or change in the amount of urine Side effects that usually do not require medical attention (report to your doctor or health care professional if they continue or are  bothersome): -changes in vision -constipation -hair loss -loss of appetite -metallic taste in the mouth or changes in taste -stomach pain This list may not describe all possible side effects. Call your doctor for medical advice about side effects. You may report side effects to FDA at 1-800-FDA-1088. Where should I keep my medicine? This drug is given in a hospital or clinic and will not be stored at home. NOTE: This sheet is a summary. It may not cover all possible information. If you have questions about this medicine, talk to your doctor, pharmacist, or health care provider.    2016, [employer] Standard. (2008-01-02 17:22:47)     Pegfilgrastim injection What is this medicine? PEGFILGRASTIM (PEG fil gra stim) is a long-acting granulocyte colony-stimulating factor that stimulates the growth of neutrophils, a type of white blood cell important in the body's fight against  infection. It is used to reduce the incidence of fever and infection in patients with certain types of cancer who are receiving chemotherapy that affects the bone marrow, and to increase survival after being exposed to high doses of radiation. This medicine may be used for other purposes; ask your health care provider or pharmacist if you have questions. What should I tell my health care provider before I take this medicine? They need to know if you have any of these conditions: -kidney disease -latex allergy -ongoing radiation therapy -sickle cell disease -skin reactions to acrylic adhesives (On-Body Injector only) -an unusual or allergic reaction to pegfilgrastim, filgrastim, other medicines, foods, dyes, or preservatives -pregnant or trying to get pregnant -breast-feeding How should I use this medicine? This medicine is for injection under the skin. If you get this medicine at home, you will be taught how to prepare and give the pre-filled syringe or how to use the On-body Injector. Refer to the patient  Instructions for Use for detailed instructions. Use exactly as directed. Take your medicine at regular intervals. Do not take your medicine more often than directed. It is important that you put your used needles and syringes in a special sharps container. Do not put them in a trash can. If you do not have a sharps container, call your pharmacist or healthcare provider to get one. Talk to your pediatrician regarding the use of this medicine in children. While this drug may be prescribed for selected conditions, precautions do apply. Overdosage: If you think you have taken too much of this medicine contact a poison control center or emergency room at once. NOTE: This medicine is only for you. Do not share this medicine with others. What if I miss a dose? It is important not to miss your dose. Call your doctor or health care professional if you miss your dose. If you miss a dose due to an On-body Injector failure or leakage, a new dose should be administered as soon as possible using a single prefilled syringe for manual use. What may interact with this medicine? Interactions have not been studied. Give your health care provider a list of all the medicines, herbs, non-prescription drugs, or dietary supplements you use. Also tell them if you smoke, drink alcohol, or use illegal drugs. Some items may interact with your medicine. This list may not describe all possible interactions. Give your health care provider a list of all the medicines, herbs, non-prescription drugs, or dietary supplements you use. Also tell them if you smoke, drink alcohol, or use illegal drugs. Some items may interact with your medicine. What should I watch for while using this medicine? You may need blood work done while you are taking this medicine. If you are going to need a MRI, CT scan, or other procedure, tell your doctor that you are using this medicine (On-Body Injector only). What side effects may I notice from receiving  this medicine? Side effects that you should report to your doctor or health care professional as soon as possible: -allergic reactions like skin rash, itching or hives, swelling of the face, lips, or tongue -dizziness -fever -pain, redness, or irritation at site where injected -pinpoint red spots on the skin -red or dark-brown urine -shortness of breath or breathing problems -stomach or side pain, or pain at the shoulder -swelling -tiredness -trouble passing urine or change in the amount of urine Side effects that usually do not require medical attention (report to your doctor or health care professional if they  continue or are bothersome): -bone pain -muscle pain This list may not describe all possible side effects. Call your doctor for medical advice about side effects. You may report side effects to FDA at 1-800-FDA-1088. Where should I keep my medicine? Keep out of the reach of children. Store pre-filled syringes in a refrigerator between 2 and 8 degrees C (36 and 46 degrees F). Do not freeze. Keep in carton to protect from light. Throw away this medicine if it is left out of the refrigerator for more than 48 hours. Throw away any unused medicine after the expiration date. NOTE: This sheet is a summary. It may not cover all possible information. If you have questions about this medicine, talk to your doctor, pharmacist, or health care provider.    2016, Elsevier/Gold Standard. (2014-06-27 14:30:14)     Claritin can be taken after onpro injector.  Loratadine tablets What is this medicine? LORATADINE (lor AT a deen) is an antihistamine. It helps to relieve sneezing, runny nose, and itchy, watery eyes. This medicine is used to treat the symptoms of allergies. It is also used to treat itchy skin rash and hives. This medicine may be used for other purposes; ask your health care provider or pharmacist if you have questions. What should I tell my health care provider before I take this  medicine? They need to know if you have any of these conditions: -asthma -kidney disease -liver disease -an unusual or allergic reaction to loratadine, other antihistamines, other medicines, foods, dyes, or preservatives -pregnant or trying to get pregnant -breast-feeding How should I use this medicine? Take this medicine by mouth with a glass of water. Follow the directions on the label. You may take this medicine with food or on an empty stomach. Take your medicine at regular intervals. Do not take your medicine more often than directed. Talk to your pediatrician regarding the use of this medicine in children. While this medicine may be used in children as young as 6 years for selected conditions, precautions do apply. Overdosage: If you think you have taken too much of this medicine contact a poison control center or emergency room at once. NOTE: This medicine is only for you. Do not share this medicine with others. What if I miss a dose? If you miss a dose, take it as soon as you can. If it is almost time for your next dose, take only that dose. Do not take double or extra doses. What may interact with this medicine? -other medicines for colds or allergies This list may not describe all possible interactions. Give your health care provider a list of all the medicines, herbs, non-prescription drugs, or dietary supplements you use. Also tell them if you smoke, drink alcohol, or use illegal drugs. Some items may interact with your medicine. What should I watch for while using this medicine? Tell your doctor or healthcare professional if your symptoms do not start to get better or if they get worse. Your mouth may get dry. Chewing sugarless gum or sucking hard candy, and drinking plenty of water may help. Contact your doctor if the problem does not go away or is severe. You may get drowsy or dizzy. Do not drive, use machinery, or do anything that needs mental alertness until you know how this  medicine affects you. Do not stand or sit up quickly, especially if you are an older patient. This reduces the risk of dizzy or fainting spells. What side effects may I notice from receiving this medicine?  Side effects that you should report to your doctor or health care professional as soon as possible: -allergic reactions like skin rash, itching or hives, swelling of the face, lips, or tongue -breathing problems -unusually restless or nervous Side effects that usually do not require medical attention (report to your doctor or health care professional if they continue or are bothersome): -drowsiness -dry or irritated mouth or throat -headache This list may not describe all possible side effects. Call your doctor for medical advice about side effects. You may report side effects to FDA at 1-800-FDA-1088. Where should I keep my medicine? Keep out of the reach of children. Store at room temperature between 2 and 30 degrees C (36 and 86 degrees F). Protect from moisture. Throw away any unused medicine after the expiration date. NOTE: This sheet is a summary. It may not cover all possible information. If you have questions about this medicine, talk to your doctor, pharmacist, or health care provider.    2016, Elsevier/Gold Standard. (2007-12-11 17:17:24)

## 2015-10-07 NOTE — Progress Notes (Signed)
[place] NOTE  [name], [name], [name], [name], RN as Registered Nurse  CHIEF COMPLAINTS/PURPOSE OF CONSULTATION:   # MARCH/APRIL 2017-NEUROENDOCRINE CA STAGE IV; [name]]- Multiple liver lesions [largest- 8.4x5.4x5.9; [name]; [name]; [name]; ascending colon uptake.   # right kidney lesion 2.6 x 1.6 cm ? Angiolipoma [followed by Urology in past]  HISTORY OF PRESENTING ILLNESS:  Kathleen James 65 y.o.  female with  Recently diagnosed multiple liver lesions in the liver/  Also  Ascending colon mass is here to review the results of her pathology/  Review the results of the PET scan and  next plan of care.  In the interim patient also had a Mediport in place.  Patient states her abdominal pain is improved the last few days. Her appetite is fair. No nausea no vomiting no discoloration of the skin.  ROS: A complete 10 point review of system is done which is negative except mentioned above in history of present illness  MEDICAL HISTORY:  Past Medical History  Diagnosis Date  . Hypertension   . Obesity (BMI 30-39.9)   . Angiolipoma of kidney     followed with serial CTs ,,  Dr. Jacqlyn Larsen  . Hammer toe   . Arthritis   . Cancer [facility])     colon    SURGICAL HISTORY: Past Surgical History  Procedure Laterality Date  . Abdominal hysterectomy  1995    endometriosis, heavy bleeding  . Oophorectomy    . Rotator cuff repair Right 2008    right shoulder.  [place]   . Cesarean section  1985  . Bunionectomy Bilateral   . Portacath placement Left 10/01/2015    Procedure: INSERTION PORT-A-CATH;  Surgeon: Robert Bellow, MD;  Location: [facility] ORS;  Service: General;  Laterality: Left;    SOCIAL HISTORY: She lives at home with her family in [place]. 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 Social History   Social History  . Marital Status: Married    Spouse Name: N/A  . Number of Children: N/A  . Years of Education: N/A   Occupational History  . Not on file.   Social History Main Topics  . Smoking status: Never Smoker   . Smokeless tobacco: Never Used  . Alcohol Use: 0.0 oz/week    0 Standard drinks or equivalent per week     Comment: occasionally  . Drug Use: No  . Sexual Activity: Not Currently   Other Topics Concern  . Not on file   Social History Narrative    FAMILY HISTORY: Family History  Problem Relation Age of Onset  . Mental illness Mother 43    bipolar, dementia  . Cancer Maternal Aunt 70    breast ca  . Hypertension Father   . Stroke Father 13    deceased  . COPD Father   . Heart disease Sister     deceased    ALLERGIES:  is allergic to bee venom and morphine and related.  MEDICATIONS:  Current Outpatient Prescriptions  Medication Sig Dispense Refill  . ALPRAZolam (XANAX) 0.25 MG tablet Take 1 tablet (0.25 mg total) by mouth daily as needed for sleep or anxiety. 90 tablet 5  . amLODipine (NORVASC) 5 MG  tablet Take 1 tablet (5 mg total) by mouth daily. 30 tablet 3  . olmesartan (BENICAR) 40 MG tablet Take 1 tablet (40 mg total) by mouth daily. 30 tablet 2  . diphenoxylate-atropine (LOMOTIL) 2.5-0.025 MG tablet Take 1 tablet by mouth 4 (four) times daily as needed for diarrhea or loose stools. 30 tablet 6  . lidocaine-prilocaine (EMLA) cream Apply 1 application topically as needed. Apply to port a cath site 1 hour prior to chemotherapy treatments 30 g 6  . ondansetron (ZOFRAN) 8 MG tablet Take 1 tablet (8 mg total) by mouth every 8 (eight) hours as needed for nausea or vomiting. 20 tablet 0  . polyethylene glycol powder (GLYCOLAX/MIRALAX) powder 255 grams one bottle for colonoscopy prep (Patient not taking: Reported on 10/07/2015) 255 g 0  . prochlorperazine  (COMPAZINE) 10 MG tablet Take 1 tablet (10 mg total) by mouth every 6 (six) hours as needed for nausea or vomiting. 30 tablet 6   No current facility-administered medications for this visit.      Marland Kitchen  PHYSICAL EXAMINATION: ECOG PERFORMANCE STATUS: 0 - Asymptomatic  Filed Vitals:   10/07/15 1413  BP: 132/84  Pulse: 94  Temp: 96.6 F (35.9 C)  Resp: 18   Filed Weights   10/07/15 1413  Weight: 203 lb 7.8 oz (92.3 kg)    GENERAL: Well-nourished well-developed; Alert, no distress and comfortable.  Accompanied by her husband and daughter. EYES: no pallor or icterus OROPHARYNX: no thrush or ulceration; good dentition  NECK: supple, no masses felt LYMPH:  no palpable lymphadenopathy in the cervical, axillary or inguinal regions LUNGS: clear to auscultation and  No wheeze or crackles HEART/CVS: regular rate & rhythm and no murmurs; No lower extremity edema ABDOMEN: abdomen soft, non-tender and normal bowel sounds; positive for hepatomegaly. Musculoskeletal:no cyanosis of digits and no clubbing  PSYCH: alert & oriented x 3 with fluent speech NEURO: no focal motor/sensory deficits SKIN:  no rashes or significant lesions; Mediport is in place  LABORATORY DATA:  I have reviewed the data as listed Lab Results  Component Value Date   WBC 4.7 09/23/2015   HGB 12.3 09/23/2015   HCT 36.2 09/23/2015   MCV 92.3 09/23/2015   PLT 314 09/23/2015    Recent Labs  08/20/15 0841 09/10/15 0818 09/23/15 1504  NA 139 136 137  K 4.9 4.2 3.5  CL 102 97 106  CO2  --  23 28  GLUCOSE 91 97 106*  BUN _0 CREATININE 1.03* 1.06* 1.01*  CALCIUM 9.4 9.1 8.9  GFRNONAA 58* 55* 57*  GFRAA 66 64 >60  PROT 7.5 7.9 8.4*  ALBUMIN 4.0 4.2 4.0  AST _1 ALT _2 ALKPHOS 79 87 70  BILITOT 0.3 0.3 0.4    RADIOGRAPHIC STUDIES: I have personally reviewed the radiological images as listed and agreed with the findings in the report. Dg Chest 1 View  10/01/2015  CLINICAL DATA:   Placement of Port-A-Cath EXAM: CHEST 1 VIEW COMPARISON:  None. FINDINGS: No active infiltrate or effusion is seen. A left-sided Port-A-Cath is now present with the tip overlying the lower SVC. No pneumothorax is seen. Mediastinal and hilar contours are unremarkable. The heart is mildly enlarged. No bony abnormality is seen. IMPRESSION: 1. Left-sided Port-A-Cath tip overlies the lower SVC. No pneumothorax. 2. Mild cardiomegaly. Electronically Signed   By: Ivar Drape M.D.   On: 10/01/2015 13:28   Ct Abdomen Pelvis W Contrast  09/17/2015  CLINICAL DATA:  RIGHT mid abdominal pain for 1 month, indigestion, liver masses by ultrasound question hemangiomata EXAM: CT ABDOMEN AND PELVIS WITH CONTRAST TECHNIQUE: Multidetector CT imaging of the abdomen and pelvis was performed using the standard protocol following bolus administration of intravenous contrast. Sagittal and coronal MPR images reconstructed from axial data set. CONTRAST:  155m ISOVUE-300 IOPAMIDOL (ISOVUE-300) INJECTION 61% IV. Dilute oral contrast. COMPARISON:  05/25/2010 ; correlation ultrasound abdomen 09/09/2015 FINDINGS: Lung bases clear. Multiple small hepatic cysts. Multiple new low-attenuation foci within liver, largest 8.4 x 5.4 x 5.9 cm lateral aspect RIGHT lobe image 16. None of these lesions demonstrate atypical enhancement pattern of hepatic hemangioma. These lesions are concerning for hepatic metastases. Additional small lesion inferior aspect RIGHT lobe liver 16 x 11 mm image 41 unchanged since 2011, with enhancement characteristics at that time indicative of a small hepatic hemangioma. Gallbladder, spleen, pancreas, adrenal glands, and LEFT kidney normal appearance. RIGHT kidney demonstrates a lesion at the posterior aspect 2.6 x 1.6 x 1.8 cm image 34, containing foci of fat attenuation question small angiomyolipoma. Focal wall thickening at ascending colon immediately distal to ileocecal valve concerning for a colon neoplasm. Enlarged RIGHT  ileocolic lymph nodes medial to ascending colon measuring up to 2.6 cm short axis. Mild sigmoid diverticulosis without evidence of diverticulitis. Remainder of colon unremarkable. Stomach and small bowel loops normal appearance. Bladder and ureters normal appearance. Uterus surgically absent with nonvisualization of ovaries and appendix. No free air, free fluid, or hernia. Osseous demineralization with degenerative disc disease changes greatest at L4-L5. 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 Colonoscopy recommended for further assessment. Additional small hepatic cysts and stable RIGHT lobe hepatic hemangioma. 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 These results will be called to the ordering clinician or representative by the Radiologist Assistant, and communication documented in the PACS or zVision Dashboard. Electronically Signed   By: MLavonia DanaM.D.   On: 09/17/2015 15:38   Nm Pet Image Initial (pi) Skull Base To Thigh  10/06/2015  CLINICAL DATA:  Initial treatment strategy for malignant neoplasm of ascending colon. EXAM: NUCLEAR MEDICINE PET SKULL BASE TO THIGH TECHNIQUE: 11.5 mCi F-18 FDG was injected intravenously. Full-ring PET imaging was performed from the skull base to thigh after the radiotracer. CT data was obtained and used for attenuation correction and anatomic localization. FASTING BLOOD GLUCOSE:  Value: 68 mg/dl COMPARISON:  Abdominal pelvic CT of 09/17/2015. Chest radiograph 10/01/2015. FINDINGS: NECK No areas of abnormal hypermetabolism. CHEST No areas of abnormal hypermetabolism. ABDOMEN/PELVIS Mild misregistration between PET and CT images. Bilateral hypermetabolic hepatic metastasis. Index lesion within the high right hepatic lobe measures on the order of 8.9 by 5.2 cm and a S.U.V. max of 42.7 on  image 128/series 3. Ascending colonic primary with direct extension and localized nodal metastasis in the ileocolic mesenteric. This conglomerate measures a S.U.V. max of 41.9, including on image 180/series 3. SKELETON No abnormal marrow activity. CT IMAGES PERFORMED FOR ATTENUATION CORRECTION No cervical adenopathy. A left-sided Port-A-Cath which terminates at the low SVC. Carotid atherosclerosis. Mild cardiomegaly. Normal adrenal glands. Right renal angiomyolipoma. Aortic atherosclerosis. Scattered colonic diverticula. Hysterectomy. Osteopenia. IMPRESSION: 1. Ascending colonic primary with extensive hypermetabolic metastasis within the adjacent ileocolic mesentery and both lobes the liver. 2. No extra abdominal metastatic disease identified. Electronically Signed   By: KAbigail MiyamotoM.D.   On: 10/06/2015 12:38  US Biopsy  09/26/2015  INDICATION: Hepatic lesions concerning for metastatic disease, suspect right colon primary EXAM: ULTRASOUND BIOPSY CORE LIVER MEDICATIONS: 1% lidocaine locally ANESTHESIA/SEDATION: Moderate (conscious) sedation was employed during this procedure. A total of Versed 2 mg and Fentanyl 50 mcg was administered intravenously. Moderate Sedation Time: 10 Minutes. The patient's level of consciousness and vital signs were monitored continuously by radiology nursing throughout the procedure under my direct supervision. FLUOROSCOPY TIME:  Fluoroscopy Time:  None. COMPLICATIONS: None immediate. PROCEDURE: Informed written consent was obtained from the patient after a thorough discussion of the procedural risks, benefits and alternatives. All questions were addressed. Maximal Sterile Barrier Technique was utilized including caps, mask, sterile gowns, sterile gloves, sterile drape, hand hygiene and skin antiseptic. A timeout was performed prior to the initiation of the procedure. Previous imaging reviewed. Preliminary ultrasound performed. The dominant right hepatic lesion was localized with  ultrasound in the mid axillary line through a intercostal space. Under sterile conditions and local anesthesia, a 17 gauge 6.8 cm access needle was advanced percutaneously under direct ultrasound into the dominant right hepatic lesion. Needle position confirmed with ultrasound. Images obtained for documentation. 2 18 gauge core biopsies obtained. Samples placed in formalin. These were intact and non fragmented. Needle removed. No immediate complication. Patient tolerated the biopsy well. IMPRESSION: Successful ultrasound right hepatic mass 18 gauge core biopsies Electronically Signed   By: Jerilynn Mages.  Shick M.D.   On: 09/26/2015 10:32   Dg C-arm 1-60 Min-no Report  10/01/2015  CLINICAL DATA: port cath inseration C-ARM 1-60 MINUTES Fluoroscopy was utilized by the requesting physician.  No radiographic interpretation.   US Abdomen Limited Ruq  09/09/2015  CLINICAL DATA:  Recurrent right upper quadrant abdominal pain postprandially EXAM: US ABDOMEN LIMITED - RIGHT UPPER QUADRANT COMPARISON:  CT scan of the abdomen of Oct 23, 2009 FINDINGS: Gallbladder: No gallstones or wall thickening visualized. No sonographic Murphy sign noted by sonographer. Common bile duct: Diameter: 5.2 mm Liver: The hepatic echotexture is heterogeneous. Hyperechoic masslike density in the right lobe measures 6.5 x 6 x 6 cm. A second area of increased echogenicity with surrounding decreased echogenicity in the right lobe measures 2.6 x 2.5 x 2.3 cm. There is a cyst in the right lobe measuring 1.4 cm in greatest dimension. There is a left lobe cyst measuring 1.5 cm in greatest dimension. IMPRESSION: Abnormal appearance of the liver worrisome for the presence of pathologic masses. These are new since the previous study. Hepatic protocol MRI or CT scanning is recommended. The gallbladder and common bile duct are unremarkable. These results will be called to the ordering clinician or representative by the Radiologist Assistant, and communication  documented in the PACS or zVision Dashboard. Electronically Signed   By: David  Martinique M.D.   On: 09/09/2015 10:12    ASSESSMENT & PLAN:   # Multiple lesions in the liver- status post biopsy shows neuroendocrine carcinoma; Ki-67 up to 60%.  CEA elevated 536.  Given the unusual pathology noted on the  Liver metastases-  Recommend   Colonoscopy/ biopsy of the primary tumor.  This is planned on  April 20th.  #  Discussed the unusual nature of the pathology-  Discussed regarding systemic therapies-Platinum- Etoposide every 3 weeks vs  FOLFIRINOX  Every 2 weeks. Discussed the potential side effects in detail. Discussed the potential side effects including but not limited to-increasing fatigue, nausea vomiting, diarrhea, hair loss, sores in the mouth, increase risk of infection and also neuropathy. Also discussed regarding diarrhea; use of  Lomotil prior to FOLFIRINOX chemotherapy.   # Growth factor-Neulasta/On pro would be given as prophylaxis for chemotherapy-induced neutropenia to prevent febrile neutropenias.   # Patient has second opinion at [facility] on the 21st. We will proceed with systemic chemotherapy- after discussing /reviewing the recommendations from [facility] in week of April 24th.   # Above plan of care was discussed the patient and family in detail. Patient will get labs on the 24th/see me/proceed with chemotherapy    [name], MD 10/08/2015 7:48 AM

## 2015-10-08 ENCOUNTER — Ambulatory Visit: Payer: Medicare HMO

## 2015-10-08 ENCOUNTER — Ambulatory Visit: Payer: Self-pay | Admitting: Internal Medicine

## 2015-10-08 ENCOUNTER — Encounter: Payer: Self-pay | Admitting: *Deleted

## 2015-10-08 MED ORDER — DIPHENOXYLATE-ATROPINE 2.5-0.025 MG PO TABS: 1.0000 | ORAL_TABLET | Freq: Four times a day (QID) | ORAL | Status: DC | PRN

## 2015-10-09 ENCOUNTER — Inpatient Hospital Stay: Payer: Medicare HMO | Admitting: Internal Medicine

## 2015-10-09 ENCOUNTER — Ambulatory Visit
Admission: RE | Admit: 2015-10-09 | Discharge: 2015-10-09 | Disposition: A | Discharge status: Home / Self Care | Payer: Medicare HMO | Source: Ambulatory Visit | Attending: General Surgery | Admitting: General Surgery

## 2015-10-09 ENCOUNTER — Ambulatory Visit: Payer: Medicare HMO | Admitting: Certified Registered Nurse Anesthetist

## 2015-10-09 ENCOUNTER — Encounter: Payer: Self-pay | Admitting: *Deleted

## 2015-10-09 ENCOUNTER — Encounter
Admission: RE | Disposition: A | Discharge status: Home / Self Care | Payer: Self-pay | Source: Ambulatory Visit | Attending: General Surgery

## 2015-10-09 DIAGNOSIS — K639 Disease of intestine, unspecified: Secondary | ICD-10-CM | POA: Diagnosis not present

## 2015-10-09 DIAGNOSIS — C7A8 Other malignant neuroendocrine tumors: Secondary | ICD-10-CM | POA: Diagnosis not present

## 2015-10-09 DIAGNOSIS — Z885 Allergy status to narcotic agent status: Secondary | ICD-10-CM | POA: Insufficient documentation

## 2015-10-09 DIAGNOSIS — I10 Essential (primary) hypertension: Secondary | ICD-10-CM | POA: Insufficient documentation

## 2015-10-09 DIAGNOSIS — Z6833 Body mass index (BMI) 33.0-33.9, adult: Secondary | ICD-10-CM | POA: Insufficient documentation

## 2015-10-09 DIAGNOSIS — Z803 Family history of malignant neoplasm of breast: Secondary | ICD-10-CM | POA: Insufficient documentation

## 2015-10-09 DIAGNOSIS — K5790 Diverticulosis of intestine, part unspecified, without perforation or abscess without bleeding: Secondary | ICD-10-CM | POA: Diagnosis not present

## 2015-10-09 DIAGNOSIS — D122 Benign neoplasm of ascending colon: Secondary | ICD-10-CM | POA: Diagnosis not present

## 2015-10-09 DIAGNOSIS — K6389 Other specified diseases of intestine: Secondary | ICD-10-CM

## 2015-10-09 DIAGNOSIS — F419 Anxiety disorder, unspecified: Secondary | ICD-10-CM | POA: Insufficient documentation

## 2015-10-09 DIAGNOSIS — K5730 Diverticulosis of large intestine without perforation or abscess without bleeding: Secondary | ICD-10-CM | POA: Insufficient documentation

## 2015-10-09 DIAGNOSIS — Z8249 Family history of ischemic heart disease and other diseases of the circulatory system: Secondary | ICD-10-CM | POA: Insufficient documentation

## 2015-10-09 DIAGNOSIS — Z79899 Other long term (current) drug therapy: Secondary | ICD-10-CM | POA: Insufficient documentation

## 2015-10-09 DIAGNOSIS — Z90710 Acquired absence of both cervix and uterus: Secondary | ICD-10-CM | POA: Insufficient documentation

## 2015-10-09 DIAGNOSIS — Z91030 Bee allergy status: Secondary | ICD-10-CM | POA: Insufficient documentation

## 2015-10-09 DIAGNOSIS — Z825 Family history of asthma and other chronic lower respiratory diseases: Secondary | ICD-10-CM | POA: Insufficient documentation

## 2015-10-09 DIAGNOSIS — M1990 Unspecified osteoarthritis, unspecified site: Secondary | ICD-10-CM | POA: Insufficient documentation

## 2015-10-09 DIAGNOSIS — C787 Secondary malignant neoplasm of liver and intrahepatic bile duct: Secondary | ICD-10-CM | POA: Insufficient documentation

## 2015-10-09 DIAGNOSIS — E669 Obesity, unspecified: Secondary | ICD-10-CM | POA: Insufficient documentation

## 2015-10-09 DIAGNOSIS — Z823 Family history of stroke: Secondary | ICD-10-CM | POA: Insufficient documentation

## 2015-10-09 DIAGNOSIS — Z818 Family history of other mental and behavioral disorders: Secondary | ICD-10-CM | POA: Insufficient documentation

## 2015-10-09 DIAGNOSIS — C182 Malignant neoplasm of ascending colon: Secondary | ICD-10-CM | POA: Diagnosis not present

## 2015-10-09 DIAGNOSIS — C189 Malignant neoplasm of colon, unspecified: Secondary | ICD-10-CM | POA: Diagnosis not present

## 2015-10-09 HISTORY — PX: COLONOSCOPY WITH PROPOFOL: SHX5780

## 2015-10-09 SURGERY — COLONOSCOPY WITH PROPOFOL
Anesthesia: General

## 2015-10-09 MED ORDER — PROPOFOL 10 MG/ML IV BOLUS
INTRAVENOUS | Status: DC | PRN
  Administered 2015-10-09: 70 mg via INTRAVENOUS

## 2015-10-09 MED ORDER — SODIUM CHLORIDE 0.9 % IV SOLN
INTRAVENOUS | Status: DC
  Administered 2015-10-09: 1000 mL via INTRAVENOUS

## 2015-10-09 MED ORDER — LIDOCAINE HCL (PF) 2 % IJ SOLN
INTRAMUSCULAR | Status: DC | PRN
  Administered 2015-10-09: 100 mg via INTRADERMAL

## 2015-10-09 MED ORDER — PROPOFOL 500 MG/50ML IV EMUL
INTRAVENOUS | Status: DC | PRN
  Administered 2015-10-09: 150 ug/kg/min via INTRAVENOUS

## 2015-10-09 NOTE — H&P (Signed)
No change in clinical history or exam. For colonoscopy, biopsy of right colon mass to determine chemotherapy regimen.

## 2015-10-09 NOTE — Op Note (Signed)
[facility] Patient Name: Kathleen James Procedure Date: 10/09/2015 1:06 PM MRN: [id number] Account #: [id number] Date of Birth: [birth year] Admit Type: Outpatient Age: 65 Room: [facility] ENDO ROOM 1 Gender: Female Note Status: Finalized Procedure:            Colonoscopy Indications:          Abnormal CT of the GI tract Providers:            Robert Bellow, MD Referring MD:         Deborra Medina, MD (Referring MD) Medicines:            Monitored Anesthesia Care Procedure:            Pre-Anesthesia Assessment:                       - Prior to the procedure, a History and Physical was                        performed, and patient medications, allergies and                        sensitivities were reviewed. The patient's tolerance of                        previous anesthesia was reviewed.                       - The risks and benefits of the procedure and the                        sedation options and risks were discussed with the                        patient. All questions were answered and informed                        consent was obtained.                       After obtaining informed consent, the colonoscope was                        passed under direct vision. Throughout the procedure,                        the patient's blood pressure, pulse, and oxygen                        saturations were monitored continuously. The                        Colonoscope was introduced through the anus and                        advanced to the the cecum, identified by appendiceal                        orifice and ileocecal valve. The colonoscopy was                        somewhat difficult due to significant  looping.                        Successful completion of the procedure was aided by                        using manual pressure. The patient tolerated the                        procedure well. The quality of the bowel preparation                         was excellent. Findings:      A fungating and ulcerated partially obstructing large mass was found in       the mid ascending colon. The mass was partially circumferential       (involving two-thirds of the lumen circumference). The mass measured       three cm in length. No bleeding was present. Biopsies were taken with a       cold forceps for histology. An endoloop was maneuvered over the polyp       stalk and closed at the mucosal attachment prior to removal in order to       prevent bleeding. Polyp resection was incomplete. The resected tissue       was retrieved.      A few medium-mouthed diverticula were found in the sigmoid colon.      The retroflexed view of the distal rectum and anal verge was normal and       showed no anal or rectal abnormalities. Impression:           - Malignant partially obstructing tumor in the mid                        ascending colon. Tissue was removed. Biopsied.                       - Diverticulosis in the sigmoid colon.                       - The distal rectum and anal verge are normal on                        retroflexion view. Recommendation:       - Discharge patient to home (via wheelchair). Procedure Code(s):    --- Professional ---                       [phone], Colonoscopy, flexible; with biopsy, single or                        multiple Diagnosis Code(s):    --- Professional ---                       C18.2, Malignant neoplasm of ascending colon                       K57.30, Diverticulosis of large intestine without                        perforation or abscess without bleeding  R93.3, Abnormal findings on diagnostic imaging of other                        parts of digestive tract CPT copyright 2016 American [employer] Association. All rights reserved. The codes documented in this report are preliminary and upon coder review may  be revised to meet current compliance requirements. Robert Bellow,  MD 10/09/2015 1:45:51 PM This report has been signed electronically. Number of Addenda: 0 Note Initiated On: 10/09/2015 1:06 PM Scope Withdrawal Time: 0 hours 18 minutes 9 seconds  Total Procedure Duration: 0 hours 27 minutes 59 seconds       [facility]

## 2015-10-09 NOTE — Transfer of Care (Signed)
Immediate Anesthesia Transfer of Care Note  Patient: Kathleen James  Procedure(s) Performed: Procedure(s): COLONOSCOPY WITH PROPOFOL (N/A)  Patient Location: PACU  Anesthesia Type:General  Level of Consciousness: awake, alert  and responds to stimulation  Airway & Oxygen Therapy: Patient Spontanous Breathing and Patient connected to nasal cannula oxygen  Post-op Assessment: Report given to RN and Post -op Vital signs reviewed and stable  Post vital signs: Reviewed and stable  Last Vitals:  Filed Vitals:   10/09/15 1242 10/09/15 1344  BP: 133/90 103/67  Pulse: 92 85  Temp: 36.2 C   Resp: 22 15    Complications: No apparent anesthesia complications

## 2015-10-09 NOTE — Anesthesia Preprocedure Evaluation (Signed)
Anesthesia Evaluation  Patient identified by MRN, date of birth, ID band Patient awake    Reviewed: Allergy & Precautions, NPO status , Patient's Chart, lab work & pertinent test results, reviewed documented beta blocker date and time   Airway Mallampati: III  TM Distance: >3 FB     Dental  (+) Chipped   Pulmonary           Cardiovascular hypertension, Pt. on medications      Neuro/Psych Anxiety    GI/Hepatic   Endo/Other    Renal/GU      Musculoskeletal  (+) Arthritis ,   Abdominal   Peds  Hematology   Anesthesia Other Findings Obese.  Reproductive/Obstetrics                             Anesthesia Physical Anesthesia Plan  ASA: III  Anesthesia Plan: General   Post-op Pain Management:    Induction: Intravenous  Airway Management Planned: Nasal Cannula  Additional Equipment:   Intra-op Plan:   Post-operative Plan:   Informed Consent: I have reviewed the patients History and Physical, chart, labs and discussed the procedure including the risks, benefits and alternatives for the proposed anesthesia with the patient or authorized representative who has indicated his/her understanding and acceptance.     Plan Discussed with: CRNA  Anesthesia Plan Comments:         Anesthesia Quick Evaluation

## 2015-10-10 ENCOUNTER — Ambulatory Visit: Payer: Medicare HMO

## 2015-10-10 ENCOUNTER — Telehealth: Payer: Self-pay | Admitting: *Deleted

## 2015-10-10 DIAGNOSIS — C787 Secondary malignant neoplasm of liver and intrahepatic bile duct: Secondary | ICD-10-CM | POA: Diagnosis not present

## 2015-10-10 DIAGNOSIS — C189 Malignant neoplasm of colon, unspecified: Secondary | ICD-10-CM | POA: Diagnosis not present

## 2015-10-10 NOTE — Anesthesia Postprocedure Evaluation (Signed)
Anesthesia Post Note  Patient: Kathleen James  Procedure(s) Performed: Procedure(s) (LRB): COLONOSCOPY WITH PROPOFOL (N/A)  Patient location during evaluation: Endoscopy Anesthesia Type: General Level of consciousness: awake and alert Pain management: pain level controlled Vital Signs Assessment: post-procedure vital signs reviewed and stable Respiratory status: spontaneous breathing, nonlabored ventilation, respiratory function stable and patient connected to nasal cannula oxygen Cardiovascular status: blood pressure returned to baseline and stable Postop Assessment: no signs of nausea or vomiting Anesthetic complications: no    Last Vitals:  Filed Vitals:   10/09/15 1406 10/09/15 1414  BP: 144/89 146/89  Pulse: 84 79  Temp:    Resp: 15 19    Last Pain:  Filed Vitals:   10/10/15 0753  PainSc: 0-No pain                 Ronrico Dupin S

## 2015-10-10 NOTE — Telephone Encounter (Signed)
I contacted patient and also spoke with daughter, Kathleen James. Dr. Rogue Bussing spoke with oncologist at [facility] today after pt's apt. A decision was made to start patient on [place] day1/etoposide day 1-3.  I explained that this treatment may be easier to tolerate and will be given every 3 weeks. She states that it made "her feel more comfortable with the treatment decisions after the apt at [facility]." She will come on Monday 10/13/15 as planned. She thanked me for calling her with this information.

## 2015-10-12 ENCOUNTER — Encounter: Payer: Self-pay | Admitting: General Surgery

## 2015-10-12 LAB — SURGICAL PATHOLOGY

## 2015-10-13 ENCOUNTER — Inpatient Hospital Stay (HOSPITAL_BASED_OUTPATIENT_CLINIC_OR_DEPARTMENT_OTHER): Payer: Medicare HMO | Admitting: Internal Medicine

## 2015-10-13 ENCOUNTER — Inpatient Hospital Stay: Payer: Medicare HMO

## 2015-10-13 VITALS — BP 150/95 | HR 77 | Temp 97.1°F | Resp 18 | Wt 203.3 lb

## 2015-10-13 DIAGNOSIS — Z5111 Encounter for antineoplastic chemotherapy: Secondary | ICD-10-CM | POA: Diagnosis not present

## 2015-10-13 DIAGNOSIS — N289 Disorder of kidney and ureter, unspecified: Secondary | ICD-10-CM | POA: Diagnosis not present

## 2015-10-13 DIAGNOSIS — C7A8 Other malignant neuroendocrine tumors: Secondary | ICD-10-CM

## 2015-10-13 DIAGNOSIS — I10 Essential (primary) hypertension: Secondary | ICD-10-CM

## 2015-10-13 DIAGNOSIS — C189 Malignant neoplasm of colon, unspecified: Secondary | ICD-10-CM

## 2015-10-13 DIAGNOSIS — T451X5S Adverse effect of antineoplastic and immunosuppressive drugs, sequela: Secondary | ICD-10-CM

## 2015-10-13 DIAGNOSIS — C182 Malignant neoplasm of ascending colon: Secondary | ICD-10-CM

## 2015-10-13 DIAGNOSIS — D6481 Anemia due to antineoplastic chemotherapy: Secondary | ICD-10-CM

## 2015-10-13 DIAGNOSIS — C7A022 Malignant carcinoid tumor of the ascending colon: Secondary | ICD-10-CM | POA: Diagnosis not present

## 2015-10-13 DIAGNOSIS — Z79899 Other long term (current) drug therapy: Secondary | ICD-10-CM

## 2015-10-13 DIAGNOSIS — C7B02 Secondary carcinoid tumors of liver: Secondary | ICD-10-CM | POA: Diagnosis not present

## 2015-10-13 DIAGNOSIS — C799 Secondary malignant neoplasm of unspecified site: Principal | ICD-10-CM

## 2015-10-13 DIAGNOSIS — K5730 Diverticulosis of large intestine without perforation or abscess without bleeding: Secondary | ICD-10-CM

## 2015-10-13 DIAGNOSIS — R590 Localized enlarged lymph nodes: Secondary | ICD-10-CM

## 2015-10-13 DIAGNOSIS — M1990 Unspecified osteoarthritis, unspecified site: Secondary | ICD-10-CM

## 2015-10-13 LAB — COMPREHENSIVE METABOLIC PANEL
ALT: 28 U/L (ref 14–54)
AST: 32 U/L (ref 15–41)
Albumin: 4 g/dL (ref 3.5–5.0)
Alkaline Phosphatase: 68 U/L (ref 38–126)
Anion gap: 3 — ABNORMAL LOW (ref 5–15)
BUN: 21 mg/dL — ABNORMAL HIGH (ref 6–20)
CHLORIDE: 109 mmol/L (ref 101–111)
CO2: 25 mmol/L (ref 22–32)
CREATININE: 0.78 mg/dL (ref 0.44–1.00)
Calcium: 8.7 mg/dL — ABNORMAL LOW (ref 8.9–10.3)
Glucose, Bld: 94 mg/dL (ref 65–99)
POTASSIUM: 3.8 mmol/L (ref 3.5–5.1)
Sodium: 137 mmol/L (ref 135–145)
Total Bilirubin: 0.4 mg/dL (ref 0.3–1.2)
Total Protein: 7.7 g/dL (ref 6.5–8.1)

## 2015-10-13 LAB — CBC WITH DIFFERENTIAL/PLATELET
BASOS PCT: 1 %
Basophils Absolute: 0 10*3/uL (ref 0–0.1)
EOS PCT: 3 %
Eosinophils Absolute: 0.1 10*3/uL (ref 0–0.7)
HEMATOCRIT: 33.2 % — AB (ref 35.0–47.0)
Hemoglobin: 11.8 g/dL — ABNORMAL LOW (ref 12.0–16.0)
Lymphocytes Relative: 25 %
Lymphs Abs: 1.1 10*3/uL (ref 1.0–3.6)
MCH: 32.9 pg (ref 26.0–34.0)
MCHC: 35.5 g/dL (ref 32.0–36.0)
MCV: 92.8 fL (ref 80.0–100.0)
MONO ABS: 0.3 10*3/uL (ref 0.2–0.9)
MONOS PCT: 7 %
NEUTROS ABS: 2.9 10*3/uL (ref 1.4–6.5)
Neutrophils Relative %: 64 %
Platelets: 303 10*3/uL (ref 150–440)
RBC: 3.58 MIL/uL — ABNORMAL LOW (ref 3.80–5.20)
RDW: 13.6 % (ref 11.5–14.5)
WBC: 4.5 10*3/uL (ref 3.6–11.0)

## 2015-10-13 MED ORDER — HEPARIN SOD (PORK) LOCK FLUSH 100 UNIT/ML IV SOLN
500.0000 [IU] | Freq: Once | INTRAVENOUS | Status: AC | PRN
  Administered 2015-10-13: 500 [IU]
  Filled 2015-10-13: qty 5

## 2015-10-13 MED ORDER — SODIUM CHLORIDE 0.9% FLUSH
10.0000 mL | INTRAVENOUS | Status: DC | PRN
  Administered 2015-10-13: 10 mL
  Filled 2015-10-13: qty 10

## 2015-10-13 MED ORDER — PALONOSETRON HCL INJECTION 0.25 MG/5ML
0.2500 mg | Freq: Once | INTRAVENOUS | Status: AC
  Administered 2015-10-13: 0.25 mg via INTRAVENOUS
  Filled 2015-10-13: qty 5

## 2015-10-13 MED ORDER — SODIUM CHLORIDE 0.9 % IV SOLN
Freq: Once | INTRAVENOUS | Status: AC
  Administered 2015-10-13: 10:00:00 via INTRAVENOUS
  Filled 2015-10-13: qty 1000

## 2015-10-13 MED ORDER — SODIUM CHLORIDE 0.9 % IV SOLN
533.5000 mg | Freq: Once | INTRAVENOUS | Status: AC
  Administered 2015-10-13: 530 mg via INTRAVENOUS
  Filled 2015-10-13: qty 53

## 2015-10-13 MED ORDER — SODIUM CHLORIDE 0.9 % IV SOLN
10.0000 mg | Freq: Once | INTRAVENOUS | Status: AC
  Administered 2015-10-13: 10 mg via INTRAVENOUS
  Filled 2015-10-13: qty 1

## 2015-10-13 MED ORDER — SODIUM CHLORIDE 0.9 % IV SOLN
100.0000 mg/m2 | Freq: Once | INTRAVENOUS | Status: AC
  Administered 2015-10-13: 200 mg via INTRAVENOUS
  Filled 2015-10-13: qty 10

## 2015-10-13 NOTE — Progress Notes (Signed)
[place] NOTE  [name], [name], [name], [name], RN as Registered Nurse  CHIEF COMPLAINTS/PURPOSE OF CONSULTATION:   # MARCH/APRIL 2017-NEUROENDOCRINE CA STAGE IV; [name]; [name]; [name]; [name]; [name]; ascending colon uptake.   # right kidney lesion 2.6 x 1.6 cm ? Angiolipoma [followed by Urology in past]  HISTORY OF PRESENTING ILLNESS:  Kathleen James 65 y.o.  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  Patient interim has been evaluated at [facility] for second opinion. Also she had a colonoscopy with the biopsy.   Patient's appetite is fair. No weight loss. Mild abdominal pain not any worse. Her appetite is fair. No nausea no vomiting no discoloration of the skin.  ROS: A complete 10 point review of system is done which is negative except mentioned above in history of present illness  MEDICAL HISTORY:  Past Medical History  Diagnosis Date  . Hypertension   . Obesity (BMI 30-39.9)   . Angiolipoma of kidney     followed with serial CTs ,,  Dr. Jacqlyn Larsen  . Hammer toe   . Arthritis   . Cancer [facility])     colon    SURGICAL HISTORY: Past Surgical History  Procedure Laterality Date  . Abdominal hysterectomy  1995    endometriosis, heavy bleeding  . Oophorectomy    . Rotator cuff repair Right 2008    right shoulder.  [place]   . Cesarean section  1985  . Bunionectomy Bilateral   . Portacath placement Left 10/01/2015    Procedure: INSERTION PORT-A-CATH;  Surgeon: Robert Bellow, MD;  Location: [facility] ORS;  Service: General;  Laterality: Left;  . Colonoscopy with propofol N/A  10/09/2015    Procedure: COLONOSCOPY WITH PROPOFOL;  Surgeon: Robert Bellow, MD;  Location: [facility] ENDOSCOPY;  Service: Endoscopy;  Laterality: N/A;    SOCIAL HISTORY: She lives at home with her family in [place]. 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 Social History   Social History  . Marital Status: Married    Spouse Name: N/A  . Number of Children: N/A  . Years of Education: N/A   Occupational History  . Not on file.   Social History Main Topics  . Smoking status: Never Smoker   . Smokeless tobacco: Never Used  . Alcohol Use: 0.0 oz/week    0 Standard drinks or equivalent per week     Comment: occasionally  . Drug Use: No  . Sexual Activity: Not Currently   Other Topics Concern  . Not on file   Social History Narrative    FAMILY HISTORY: Family History  Problem Relation Age of Onset  . Mental illness Mother 17    bipolar, dementia  . Cancer Maternal Aunt 70    breast ca  . Hypertension Father   . Stroke Father 25    deceased  . COPD Father   . Heart disease Sister     deceased    ALLERGIES:  is allergic to bee venom and morphine and related.  MEDICATIONS:  Current Outpatient Prescriptions  Medication Sig Dispense Refill  . ALPRAZolam (XANAX) 0.25  MG tablet Take 1 tablet (0.25 mg total) by mouth daily as needed for sleep or anxiety. 90 tablet 5  . amLODipine (NORVASC) 5 MG tablet Take 1 tablet (5 mg total) by mouth daily. 30 tablet 3  . diphenoxylate-atropine (LOMOTIL) 2.5-0.025 MG tablet Take 1 tablet by mouth 4 (four) times daily as needed for diarrhea or loose stools. 30 tablet 6  . lidocaine-prilocaine (EMLA) cream Apply 1 application topically as needed. Apply to port a cath site 1 hour prior to chemotherapy treatments 30 g 6  . olmesartan (BENICAR) 40 MG tablet Take 1 tablet (40 mg total) by mouth daily. 30 tablet 2  . ondansetron (ZOFRAN) 8 MG tablet Take 1 tablet (8 mg total) by mouth every 8 (eight)  hours as needed for nausea or vomiting. 20 tablet 0  . prochlorperazine (COMPAZINE) 10 MG tablet Take 1 tablet (10 mg total) by mouth every 6 (six) hours as needed for nausea or vomiting. 30 tablet 6   No current facility-administered medications for this visit.      Marland Kitchen  PHYSICAL EXAMINATION: ECOG PERFORMANCE STATUS: 0 - Asymptomatic  Filed Vitals:   10/13/15 0856  BP: 150/95  Pulse: 77  Temp: 97.1 F (36.2 C)  Resp: 18   Filed Weights   10/13/15 0856  Weight: 203 lb 4.2 oz (92.2 kg)    GENERAL: Well-nourished well-developed; Alert, no distress and comfortable.  Accompanied by her daughter. EYES: no pallor or icterus OROPHARYNX: no thrush or ulceration; good dentition  NECK: supple, no masses felt LYMPH:  no palpable lymphadenopathy in the cervical, axillary or inguinal regions LUNGS: clear to auscultation and  No wheeze or crackles HEART/CVS: regular rate & rhythm and no murmurs; No lower extremity edema ABDOMEN: abdomen soft, non-tender and normal bowel sounds; positive for hepatomegaly. Musculoskeletal:no cyanosis of digits and no clubbing  PSYCH: alert & oriented x 3 with fluent speech NEURO: no focal motor/sensory deficits SKIN:  no rashes or significant lesions; Mediport is in place  LABORATORY DATA:  I have reviewed the data as listed Lab Results  Component Value Date   WBC 4.5 10/13/2015   HGB 11.8* 10/13/2015   HCT 33.2* 10/13/2015   MCV 92.8 10/13/2015   PLT 303 10/13/2015    Recent Labs  08/20/15 0841 09/10/15 0818 09/23/15 1504  NA 139 136 137  K 4.9 4.2 3.5  CL 102 97 106  CO2  --  23 28  GLUCOSE 91 97 106*  BUN '16 16 17  ' CREATININE 1.03* 1.06* 1.01*  CALCIUM 9.4 9.1 8.9  GFRNONAA 58* 55* 57*  GFRAA 66 64 >60  PROT 7.5 7.9 8.4*  ALBUMIN 4.0 4.2 4.0  AST '22 22 25  ' ALT '22 24 20  ' ALKPHOS 79 87 70  BILITOT 0.3 0.3 0.4    RADIOGRAPHIC STUDIES: I have personally reviewed the radiological images as listed and agreed with the findings in  the report. Dg Chest 1 View  10/01/2015  CLINICAL DATA:  Placement of Port-A-Cath EXAM: CHEST 1 VIEW COMPARISON:  None. FINDINGS: No active infiltrate or effusion is seen. A left-sided Port-A-Cath is now present with the tip overlying the lower SVC. No pneumothorax is seen. Mediastinal and hilar contours are unremarkable. The heart is mildly enlarged. No bony abnormality is seen. IMPRESSION: 1. Left-sided Port-A-Cath tip overlies the lower SVC. No pneumothorax. 2. Mild cardiomegaly. Electronically Signed   By: Ivar Drape M.D.   On: 10/01/2015 13:28   Ct Abdomen Pelvis W Contrast  09/17/2015  CLINICAL  DATA:  RIGHT mid abdominal pain for 1 month, indigestion, liver masses by ultrasound question hemangiomata EXAM: CT ABDOMEN AND PELVIS WITH CONTRAST TECHNIQUE: Multidetector CT imaging of the abdomen and pelvis was performed using the standard protocol following bolus administration of intravenous contrast. Sagittal and coronal MPR images reconstructed from axial data set. CONTRAST:  16m ISOVUE-300 IOPAMIDOL (ISOVUE-300) INJECTION 61% IV. Dilute oral contrast. COMPARISON:  05/25/2010 ; correlation ultrasound abdomen 09/09/2015 FINDINGS: Lung bases clear. Multiple small hepatic cysts. Multiple new low-attenuation foci within liver, largest 8.4 x 5.4 x 5.9 cm lateral aspect RIGHT lobe image 16. None of these lesions demonstrate atypical enhancement pattern of hepatic hemangioma. These lesions are concerning for hepatic metastases. Additional small lesion inferior aspect RIGHT lobe liver 16 x 11 mm image 41 unchanged since 2011, with enhancement characteristics at that time indicative of a small hepatic hemangioma. Gallbladder, spleen, pancreas, adrenal glands, and LEFT kidney normal appearance. RIGHT kidney demonstrates a lesion at the posterior aspect 2.6 x 1.6 x 1.8 cm image 34, containing foci of fat attenuation question small angiomyolipoma. Focal wall thickening at ascending colon immediately distal to  ileocecal valve concerning for a colon neoplasm. Enlarged RIGHT ileocolic lymph nodes medial to ascending colon measuring up to 2.6 cm short axis. Mild sigmoid diverticulosis without evidence of diverticulitis. Remainder of colon unremarkable. Stomach and small bowel loops normal appearance. Bladder and ureters normal appearance. Uterus surgically absent with nonvisualization of ovaries and appendix. No free air, free fluid, or hernia. Osseous demineralization with degenerative disc disease changes greatest at L4-L5. 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 Colonoscopy recommended for further assessment. Additional small hepatic cysts and stable RIGHT lobe hepatic hemangioma. 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 These results will be called to the ordering clinician or representative by the Radiologist Assistant, and communication documented in the PACS or zVision Dashboard. Electronically Signed   By: MLavonia DanaM.D.   On: 09/17/2015 15:38   Nm Pet Image Initial (pi) Skull Base To Thigh  10/06/2015  CLINICAL DATA:  Initial treatment strategy for malignant neoplasm of ascending colon. EXAM: NUCLEAR MEDICINE PET SKULL BASE TO THIGH TECHNIQUE: 11.5 mCi F-18 FDG was injected intravenously. Full-ring PET imaging was performed from the skull base to thigh after the radiotracer. CT data was obtained and used for attenuation correction and anatomic localization. FASTING BLOOD GLUCOSE:  Value: 68 mg/dl COMPARISON:  Abdominal pelvic CT of 09/17/2015. Chest radiograph 10/01/2015. FINDINGS: NECK No areas of abnormal hypermetabolism. CHEST No areas of abnormal hypermetabolism. ABDOMEN/PELVIS Mild misregistration between PET and CT images. Bilateral hypermetabolic hepatic metastasis. Index lesion within the high right hepatic lobe  measures on the order of 8.9 by 5.2 cm and a S.U.V. max of 42.7 on image 128/series 3. Ascending colonic primary with direct extension and localized nodal metastasis in the ileocolic mesenteric. This conglomerate measures a S.U.V. max of 41.9, including on image 180/series 3. SKELETON No abnormal marrow activity. CT IMAGES PERFORMED FOR ATTENUATION CORRECTION No cervical adenopathy. A left-sided Port-A-Cath which terminates at the low SVC. Carotid atherosclerosis. Mild cardiomegaly. Normal adrenal glands. Right renal angiomyolipoma. Aortic atherosclerosis. Scattered colonic diverticula. Hysterectomy. Osteopenia. IMPRESSION: 1. Ascending colonic primary with extensive hypermetabolic metastasis within the adjacent ileocolic mesentery and both lobes the liver. 2. No extra abdominal metastatic disease identified. Electronically Signed   By: KAbigail MiyamotoM.D.   On:  10/06/2015 12:38   US Biopsy  09/26/2015  INDICATION: Hepatic lesions concerning for metastatic disease, suspect right colon primary EXAM: ULTRASOUND BIOPSY CORE LIVER MEDICATIONS: 1% lidocaine locally ANESTHESIA/SEDATION: Moderate (conscious) sedation was employed during this procedure. A total of Versed 2 mg and Fentanyl 50 mcg was administered intravenously. Moderate Sedation Time: 10 Minutes. The patient's level of consciousness and vital signs were monitored continuously by radiology nursing throughout the procedure under my direct supervision. FLUOROSCOPY TIME:  Fluoroscopy Time:  None. COMPLICATIONS: None immediate. PROCEDURE: Informed written consent was obtained from the patient after a thorough discussion of the procedural risks, benefits and alternatives. All questions were addressed. Maximal Sterile Barrier Technique was utilized including caps, mask, sterile gowns, sterile gloves, sterile drape, hand hygiene and skin antiseptic. A timeout was performed prior to the initiation of the procedure. Previous imaging reviewed. Preliminary ultrasound  performed. The dominant right hepatic lesion was localized with ultrasound in the mid axillary line through a intercostal space. Under sterile conditions and local anesthesia, a 17 gauge 6.8 cm access needle was advanced percutaneously under direct ultrasound into the dominant right hepatic lesion. Needle position confirmed with ultrasound. Images obtained for documentation. 2 18 gauge core biopsies obtained. Samples placed in formalin. These were intact and non fragmented. Needle removed. No immediate complication. Patient tolerated the biopsy well. IMPRESSION: Successful ultrasound right hepatic mass 18 gauge core biopsies Electronically Signed   By: Jerilynn Mages.  Shick M.D.   On: 09/26/2015 10:32   Dg C-arm 1-60 Min-no Report  10/01/2015  CLINICAL DATA: port cath inseration C-ARM 1-60 MINUTES Fluoroscopy was utilized by the requesting physician.  No radiographic interpretation.    ASSESSMENT & PLAN:   # Ascending colon neuroendocrine carcinoma [Ki-67 60%] Multiple lesions in the liver. Reviewed the colon pathology in detail- also neuroendocrine carcinoma..  # Proceed with carboplatin- etoposide every 3 weeks. Cycle #1 today. CBC within normal limits except for mild anemia hemoglobin 11.6. CMP pending.  # Counseled the patient regarding the potential side effects of chemotherapy; discussed regarding nausea vomiting.   # Growth factor-Neulasta/On pro would be given as prophylaxis for chemotherapy-induced neutropenia to prevent febrile neutropenias.   # This plan was also discussed at the tumor conference.; Also reviewed the plan with Dr. Altamease Oiler on the 21st.  # Patient will follow-up with me in 10 days with CBC BMP; we'll proceed with cycle #2 in 3 weeks.    [name], MD 10/13/2015 9:19 AM

## 2015-10-14 ENCOUNTER — Inpatient Hospital Stay: Payer: Medicare HMO

## 2015-10-14 VITALS — BP 138/89 | HR 84 | Temp 97.0°F | Resp 20

## 2015-10-14 DIAGNOSIS — C189 Malignant neoplasm of colon, unspecified: Secondary | ICD-10-CM

## 2015-10-14 DIAGNOSIS — Z5111 Encounter for antineoplastic chemotherapy: Secondary | ICD-10-CM | POA: Diagnosis not present

## 2015-10-14 DIAGNOSIS — C182 Malignant neoplasm of ascending colon: Secondary | ICD-10-CM

## 2015-10-14 DIAGNOSIS — C799 Secondary malignant neoplasm of unspecified site: Secondary | ICD-10-CM

## 2015-10-14 DIAGNOSIS — C7A8 Other malignant neuroendocrine tumors: Secondary | ICD-10-CM

## 2015-10-14 MED ORDER — SODIUM CHLORIDE 0.9% FLUSH
10.0000 mL | INTRAVENOUS | Status: DC | PRN
  Administered 2015-10-14: 10 mL
  Filled 2015-10-14: qty 10

## 2015-10-14 MED ORDER — SODIUM CHLORIDE 0.9 % IV SOLN
Freq: Once | INTRAVENOUS | Status: AC
  Administered 2015-10-14: 14:00:00 via INTRAVENOUS
  Filled 2015-10-14: qty 1000

## 2015-10-14 MED ORDER — SODIUM CHLORIDE 0.9 % IV SOLN
10.0000 mg | Freq: Once | INTRAVENOUS | Status: AC
  Administered 2015-10-14: 10 mg via INTRAVENOUS
  Filled 2015-10-14: qty 1

## 2015-10-14 MED ORDER — HEPARIN SOD (PORK) LOCK FLUSH 100 UNIT/ML IV SOLN
500.0000 [IU] | Freq: Once | INTRAVENOUS | Status: AC | PRN
  Administered 2015-10-14: 500 [IU]
  Filled 2015-10-14: qty 5

## 2015-10-14 MED ORDER — ETOPOSIDE CHEMO INJECTION 1 GM/50ML
100.0000 mg/m2 | Freq: Once | INTRAVENOUS | Status: AC
  Administered 2015-10-14: 200 mg via INTRAVENOUS
  Filled 2015-10-14: qty 10

## 2015-10-15 ENCOUNTER — Inpatient Hospital Stay: Payer: Medicare HMO

## 2015-10-15 VITALS — BP 128/81 | HR 80 | Temp 97.0°F | Resp 20

## 2015-10-15 DIAGNOSIS — Z5111 Encounter for antineoplastic chemotherapy: Secondary | ICD-10-CM | POA: Diagnosis not present

## 2015-10-15 DIAGNOSIS — C7A8 Other malignant neuroendocrine tumors: Secondary | ICD-10-CM

## 2015-10-15 DIAGNOSIS — C189 Malignant neoplasm of colon, unspecified: Secondary | ICD-10-CM

## 2015-10-15 DIAGNOSIS — C799 Secondary malignant neoplasm of unspecified site: Secondary | ICD-10-CM

## 2015-10-15 DIAGNOSIS — C182 Malignant neoplasm of ascending colon: Secondary | ICD-10-CM

## 2015-10-15 MED ORDER — PEGFILGRASTIM 6 MG/0.6ML ~~LOC~~ PSKT
6.0000 mg | PREFILLED_SYRINGE | Freq: Once | SUBCUTANEOUS | Status: AC
  Administered 2015-10-15: 6 mg via SUBCUTANEOUS
  Filled 2015-10-15: qty 0.6

## 2015-10-15 MED ORDER — HEPARIN SOD (PORK) LOCK FLUSH 100 UNIT/ML IV SOLN
500.0000 [IU] | Freq: Once | INTRAVENOUS | Status: AC | PRN
  Administered 2015-10-15: 500 [IU]

## 2015-10-15 MED ORDER — SODIUM CHLORIDE 0.9 % IV SOLN
100.0000 mg/m2 | Freq: Once | INTRAVENOUS | Status: AC
  Administered 2015-10-15: 200 mg via INTRAVENOUS
  Filled 2015-10-15: qty 10

## 2015-10-15 MED ORDER — SODIUM CHLORIDE 0.9 % IV SOLN
Freq: Once | INTRAVENOUS | Status: AC
  Administered 2015-10-15: 14:00:00 via INTRAVENOUS
  Filled 2015-10-15: qty 1000

## 2015-10-15 MED ORDER — SODIUM CHLORIDE 0.9 % IV SOLN
10.0000 mg | Freq: Once | INTRAVENOUS | Status: AC
  Administered 2015-10-15: 10 mg via INTRAVENOUS
  Filled 2015-10-15: qty 1

## 2015-10-21 ENCOUNTER — Ambulatory Visit: Payer: Medicare HMO

## 2015-10-24 ENCOUNTER — Inpatient Hospital Stay (HOSPITAL_BASED_OUTPATIENT_CLINIC_OR_DEPARTMENT_OTHER): Payer: Medicare HMO | Admitting: Internal Medicine

## 2015-10-24 ENCOUNTER — Inpatient Hospital Stay: Payer: Medicare HMO | Attending: Internal Medicine

## 2015-10-24 VITALS — BP 126/87 | HR 89 | Temp 97.0°F | Resp 18 | Wt 200.6 lb

## 2015-10-24 DIAGNOSIS — M1990 Unspecified osteoarthritis, unspecified site: Secondary | ICD-10-CM

## 2015-10-24 DIAGNOSIS — Z803 Family history of malignant neoplasm of breast: Secondary | ICD-10-CM

## 2015-10-24 DIAGNOSIS — R1011 Right upper quadrant pain: Secondary | ICD-10-CM | POA: Insufficient documentation

## 2015-10-24 DIAGNOSIS — D649 Anemia, unspecified: Secondary | ICD-10-CM | POA: Insufficient documentation

## 2015-10-24 DIAGNOSIS — Z79899 Other long term (current) drug therapy: Secondary | ICD-10-CM | POA: Insufficient documentation

## 2015-10-24 DIAGNOSIS — Z7689 Persons encountering health services in other specified circumstances: Secondary | ICD-10-CM | POA: Diagnosis not present

## 2015-10-24 DIAGNOSIS — Z9221 Personal history of antineoplastic chemotherapy: Secondary | ICD-10-CM | POA: Diagnosis not present

## 2015-10-24 DIAGNOSIS — R110 Nausea: Secondary | ICD-10-CM | POA: Insufficient documentation

## 2015-10-24 DIAGNOSIS — C7A022 Malignant carcinoid tumor of the ascending colon: Secondary | ICD-10-CM | POA: Insufficient documentation

## 2015-10-24 DIAGNOSIS — C7B02 Secondary carcinoid tumors of liver: Secondary | ICD-10-CM | POA: Diagnosis not present

## 2015-10-24 DIAGNOSIS — C7A8 Other malignant neuroendocrine tumors: Secondary | ICD-10-CM

## 2015-10-24 DIAGNOSIS — D1771 Benign lipomatous neoplasm of kidney: Secondary | ICD-10-CM

## 2015-10-24 DIAGNOSIS — I10 Essential (primary) hypertension: Secondary | ICD-10-CM | POA: Diagnosis not present

## 2015-10-24 DIAGNOSIS — Z5111 Encounter for antineoplastic chemotherapy: Secondary | ICD-10-CM | POA: Diagnosis not present

## 2015-10-24 DIAGNOSIS — E669 Obesity, unspecified: Secondary | ICD-10-CM | POA: Diagnosis not present

## 2015-10-24 LAB — COMPREHENSIVE METABOLIC PANEL
ALBUMIN: 3.9 g/dL (ref 3.5–5.0)
ALT: 52 U/L (ref 14–54)
AST: 28 U/L (ref 15–41)
Alkaline Phosphatase: 90 U/L (ref 38–126)
Anion gap: 5 (ref 5–15)
BUN: 17 mg/dL (ref 6–20)
CHLORIDE: 103 mmol/L (ref 101–111)
CO2: 27 mmol/L (ref 22–32)
Calcium: 8.9 mg/dL (ref 8.9–10.3)
Creatinine, Ser: 0.97 mg/dL (ref 0.44–1.00)
GFR calc Af Amer: >60 mL/min (ref >60)
GFR calc non Af Amer: 60 mL/min — ABNORMAL LOW (ref >60)
GLUCOSE: 119 mg/dL — AB (ref 65–99)
POTASSIUM: 3.9 mmol/L (ref 3.5–5.1)
Sodium: 135 mmol/L (ref 135–145)
Total Bilirubin: 0.2 mg/dL — ABNORMAL LOW (ref 0.3–1.2)
Total Protein: 8.1 g/dL (ref 6.5–8.1)

## 2015-10-24 LAB — CBC WITH DIFFERENTIAL/PLATELET
BASOS ABS: 0.1 10*3/uL (ref 0–0.1)
BASOS PCT: 1 %
Eosinophils Absolute: 0.1 10*3/uL (ref 0–0.7)
Eosinophils Relative: 1 %
HCT: 31.3 % — ABNORMAL LOW (ref 35.0–47.0)
Hemoglobin: 10.9 g/dL — ABNORMAL LOW (ref 12.0–16.0)
Lymphocytes Relative: 17 %
Lymphs Abs: 1.8 10*3/uL (ref 1.0–3.6)
MCH: 32.2 pg (ref 26.0–34.0)
MCHC: 34.8 g/dL (ref 32.0–36.0)
MCV: 92.6 fL (ref 80.0–100.0)
MONO ABS: 0.5 10*3/uL (ref 0.2–0.9)
Monocytes Relative: 5 %
NEUTROS ABS: 7.6 10*3/uL — AB (ref 1.4–6.5)
Neutrophils Relative %: 76 %
PLATELETS: 237 10*3/uL (ref 150–440)
RBC: 3.38 MIL/uL — ABNORMAL LOW (ref 3.80–5.20)
RDW: 13.5 % (ref 11.5–14.5)
WBC: 10.1 10*3/uL (ref 3.6–11.0)

## 2015-10-24 NOTE — Progress Notes (Signed)
[place] NOTE  [name], [name], [name], [name], RN as Registered Nurse  CHIEF COMPLAINTS/PURPOSE OF CONSULTATION:   # MARCH/APRIL 2017-NEUROENDOCRINE CA STAGE IV; [name]; [name]; [name]; [name]; [name]; ascending colon uptake. April24th 2017 CARBO-ETOPOSIDE q3 W  # right kidney lesion 2.6 x 1.6 cm ? Angiolipoma [followed by Urology in past]  HISTORY OF PRESENTING ILLNESS:  Kathleen James 65 y.o.  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  Patient received cycle #1 of carbo etoposide approximately 10 days ago. Patient had mild nausea a week or so later. Otherwise no vomiting. Improved w q3 ith antiemetics.  No fevers or chills. Appetite is good. No weight loss. No tingling and numbness. No headaches. No constipation or diarrhea.  ROS: A complete 10 point review of system is done which is negative except mentioned above in history of present illness  MEDICAL HISTORY:  Past Medical History  Diagnosis Date  . Hypertension   . Obesity (BMI 30-39.9)   . Angiolipoma of kidney     followed with serial CTs ,,  Dr. Jacqlyn Larsen  . Hammer toe   . Arthritis   . Cancer [facility])     colon    SURGICAL HISTORY: Past Surgical History  Procedure Laterality Date  . Abdominal hysterectomy  1995    endometriosis, heavy bleeding  . Oophorectomy    . Rotator cuff repair Right 2008    right shoulder.  [facility]   . Cesarean section  1985  . Bunionectomy Bilateral   . Portacath placement Left 10/01/2015    Procedure: INSERTION PORT-A-CATH;  Surgeon: Robert Bellow, MD;  Location: [facility] ORS;  Service: General;  Laterality: Left;  . Colonoscopy with propofol N/A 10/09/2015    Procedure: COLONOSCOPY WITH PROPOFOL;  Surgeon: Robert Bellow, MD;  Location: [facility] ENDOSCOPY;  Service: Endoscopy;  Laterality: N/A;    SOCIAL HISTORY: She lives at home with her family in [place]. 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 Social History   Social History  . Marital Status: Married    Spouse Name: N/A  . Number of Children: N/A  . Years of Education: N/A   Occupational History  . Not on file.   Social History Main Topics  . Smoking status: Never Smoker   . Smokeless tobacco: Never Used  . Alcohol Use: 0.0 oz/week    0 Standard drinks or equivalent per week     Comment: occasionally  . Drug Use: No  . Sexual Activity: Not Currently   Other Topics Concern  . Not on file   Social History Narrative    FAMILY HISTORY: Family History  Problem Relation Age of Onset  . Mental illness Mother 27    bipolar, dementia  . Cancer Maternal Aunt 70    breast ca  . Hypertension Father   . Stroke Father 37    deceased  . COPD Father   . Heart disease Sister     deceased    ALLERGIES:  is allergic to bee venom and morphine and related.  MEDICATIONS:  Current  Outpatient Prescriptions  Medication Sig Dispense Refill  . ALPRAZolam (XANAX) 0.25 MG tablet Take 1 tablet (0.25 mg total) by mouth daily as needed for sleep or anxiety. 90 tablet 5  . amLODipine (NORVASC) 5 MG tablet Take 1 tablet (5 mg total) by mouth daily. 30 tablet 3  . diphenoxylate-atropine (LOMOTIL) 2.5-0.025 MG tablet Take 1 tablet by mouth 4 (four) times daily as needed for diarrhea or loose stools. 30 tablet 6  . lidocaine-prilocaine (EMLA) cream Apply 1 application topically as needed. Apply to port a cath site 1 hour prior to chemotherapy treatments 30 g 6  . olmesartan (BENICAR) 40 MG tablet Take 1 tablet (40 mg total) by mouth daily.  30 tablet 2  . ondansetron (ZOFRAN) 8 MG tablet Take 1 tablet (8 mg total) by mouth every 8 (eight) hours as needed for nausea or vomiting. 20 tablet 0  . prochlorperazine (COMPAZINE) 10 MG tablet Take 1 tablet (10 mg total) by mouth every 6 (six) hours as needed for nausea or vomiting. 30 tablet 6   No current facility-administered medications for this visit.      Marland Kitchen  PHYSICAL EXAMINATION: ECOG PERFORMANCE STATUS: 0 - Asymptomatic  Filed Vitals:   10/24/15 1428  BP: 126/87  Pulse: 89  Temp: 97 F (36.1 C)  Resp: 18   Filed Weights   10/24/15 1428  Weight: 200 lb 9.9 oz (91 kg)    GENERAL: Well-nourished well-developed; Alert, no distress and comfortable.  Accompanied by her daughter/Husband. EYES: no pallor or icterus OROPHARYNX: no thrush or ulceration; good dentition  NECK: supple, no masses felt LYMPH:  no palpable lymphadenopathy in the cervical, axillary or inguinal regions LUNGS: clear to auscultation and  No wheeze or crackles HEART/CVS: regular rate & rhythm and no murmurs; No lower extremity edema ABDOMEN: abdomen soft, non-tender and normal bowel sounds; positive for hepatomegaly. Musculoskeletal:no cyanosis of digits and no clubbing  PSYCH: alert & oriented x 3 with fluent speech NEURO: no focal motor/sensory deficits SKIN:  no rashes or significant lesions; Mediport is in place  LABORATORY DATA:  I have reviewed the data as listed Lab Results  Component Value Date   WBC 10.1 10/24/2015   HGB 10.9* 10/24/2015   HCT 31.3* 10/24/2015   MCV 92.6 10/24/2015   PLT 237 10/24/2015    Recent Labs  09/23/15 1504 10/13/15 0827 10/24/15 1410  NA 137 137 135  K 3.5 3.8 3.9  CL 106 109 103  CO2 _0 GLUCOSE 106* 94 119*  BUN 17 21* 17  CREATININE 1.01* 0.78 0.97  CALCIUM 8.9 8.7* 8.9  GFRNONAA 57* >60 60*  GFRAA >60 >60 >60  PROT 8.4* 7.7 8.1  ALBUMIN 4.0 4.0 3.9  AST 25 32 28  ALT 20 28 52  ALKPHOS 70 68 90  BILITOT 0.4 0.4 0.2*     RADIOGRAPHIC STUDIES: I have personally reviewed the radiological images as listed and agreed with the findings in the report. Dg Chest 1 View  10/01/2015  CLINICAL DATA:  Placement of Port-A-Cath EXAM: CHEST 1 VIEW COMPARISON:  None. FINDINGS: No active infiltrate or effusion is seen. A left-sided Port-A-Cath is now present with the tip overlying the lower SVC. No pneumothorax is seen. Mediastinal and hilar contours are unremarkable. The heart is mildly enlarged. No bony abnormality is seen. IMPRESSION: 1. Left-sided Port-A-Cath tip overlies the lower SVC. No pneumothorax. 2. Mild cardiomegaly. Electronically Signed   By: Ivar Drape M.D.   On: 10/01/2015 13:28  Nm Pet Image Initial (pi) Skull Base To Thigh  10/06/2015  CLINICAL DATA:  Initial treatment strategy for malignant neoplasm of ascending colon. EXAM: NUCLEAR MEDICINE PET SKULL BASE TO THIGH TECHNIQUE: 11.5 mCi F-18 FDG was injected intravenously. Full-ring PET imaging was performed from the skull base to thigh after the radiotracer. CT data was obtained and used for attenuation correction and anatomic localization. FASTING BLOOD GLUCOSE:  Value: 68 mg/dl COMPARISON:  Abdominal pelvic CT of 09/17/2015. Chest radiograph 10/01/2015. FINDINGS: NECK No areas of abnormal hypermetabolism. CHEST No areas of abnormal hypermetabolism. ABDOMEN/PELVIS Mild misregistration between PET and CT images. Bilateral hypermetabolic hepatic metastasis. Index lesion within the high right hepatic lobe measures on the order of 8.9 by 5.2 cm and a S.U.V. max of 42.7 on image 128/series 3. Ascending colonic primary with direct extension and localized nodal metastasis in the ileocolic mesenteric. This conglomerate measures a S.U.V. max of 41.9, including on image 180/series 3. SKELETON No abnormal marrow activity. CT IMAGES PERFORMED FOR ATTENUATION CORRECTION No cervical adenopathy. A left-sided Port-A-Cath which terminates at the low SVC. Carotid atherosclerosis. Mild  cardiomegaly. Normal adrenal glands. Right renal angiomyolipoma. Aortic atherosclerosis. Scattered colonic diverticula. Hysterectomy. Osteopenia. IMPRESSION: 1. Ascending colonic primary with extensive hypermetabolic metastasis within the adjacent ileocolic mesentery and both lobes the liver. 2. No extra abdominal metastatic disease identified. Electronically Signed   By: Abigail Miyamoto M.D.   On: 10/06/2015 12:38   US Biopsy  09/26/2015  INDICATION: Hepatic lesions concerning for metastatic disease, suspect right colon primary EXAM: ULTRASOUND BIOPSY CORE LIVER MEDICATIONS: 1% lidocaine locally ANESTHESIA/SEDATION: Moderate (conscious) sedation was employed during this procedure. A total of Versed 2 mg and Fentanyl 50 mcg was administered intravenously. Moderate Sedation Time: 10 Minutes. The patient's level of consciousness and vital signs were monitored continuously by radiology nursing throughout the procedure under my direct supervision. FLUOROSCOPY TIME:  Fluoroscopy Time:  None. COMPLICATIONS: None immediate. PROCEDURE: Informed written consent was obtained from the patient after a thorough discussion of the procedural risks, benefits and alternatives. All questions were addressed. Maximal Sterile Barrier Technique was utilized including caps, mask, sterile gowns, sterile gloves, sterile drape, hand hygiene and skin antiseptic. A timeout was performed prior to the initiation of the procedure. Previous imaging reviewed. Preliminary ultrasound performed. The dominant right hepatic lesion was localized with ultrasound in the mid axillary line through a intercostal space. Under sterile conditions and local anesthesia, a 17 gauge 6.8 cm access needle was advanced percutaneously under direct ultrasound into the dominant right hepatic lesion. Needle position confirmed with ultrasound. Images obtained for documentation. 2 18 gauge core biopsies obtained. Samples placed in formalin. These were intact and non  fragmented. Needle removed. No immediate complication. Patient tolerated the biopsy well. IMPRESSION: Successful ultrasound right hepatic mass 18 gauge core biopsies Electronically Signed   By: Jerilynn Mages.  Shick M.D.   On: 09/26/2015 10:32   Dg C-arm 1-60 Min-no Report  10/01/2015  CLINICAL DATA: port cath inseration C-ARM 1-60 MINUTES Fluoroscopy was utilized by the requesting physician.  No radiographic interpretation.    ASSESSMENT & PLAN:   # Ascending colon neuroendocrine carcinoma [Ki-67 60%] Multiple lesions in the liver. Palliative first-line Carboplatin- etoposide every 3 weeks. Cycle #1 appx 10 days ago. Patient tolerated chemotherapy very well. No obvious progression noted.  # Proceed with today. CBC within normal limits except for mild anemia hemoglobin 10.9. CMP-wnl  # Nausea- Grade-1.  # we'll proceed with cycle #2 as planned. CBC CMP and CEA.    Lenetta Quaker  Ann Lions, MD 10/24/2015 3:10 PM

## 2015-10-28 ENCOUNTER — Other Ambulatory Visit: Payer: Self-pay | Admitting: Internal Medicine

## 2015-10-28 ENCOUNTER — Encounter: Payer: Self-pay | Admitting: Internal Medicine

## 2015-10-28 NOTE — Telephone Encounter (Signed)
Then the 40 mg Benicar wouldn't be covered by patient plan right?

## 2015-11-03 ENCOUNTER — Inpatient Hospital Stay: Payer: Medicare HMO

## 2015-11-03 ENCOUNTER — Inpatient Hospital Stay (HOSPITAL_BASED_OUTPATIENT_CLINIC_OR_DEPARTMENT_OTHER): Payer: Medicare HMO | Admitting: Internal Medicine

## 2015-11-03 VITALS — BP 120/82 | HR 88 | Resp 20

## 2015-11-03 VITALS — BP 136/85 | HR 88 | Temp 96.9°F | Resp 18 | Wt 200.2 lb

## 2015-11-03 DIAGNOSIS — C7B02 Secondary carcinoid tumors of liver: Secondary | ICD-10-CM | POA: Diagnosis not present

## 2015-11-03 DIAGNOSIS — C799 Secondary malignant neoplasm of unspecified site: Secondary | ICD-10-CM

## 2015-11-03 DIAGNOSIS — R110 Nausea: Secondary | ICD-10-CM

## 2015-11-03 DIAGNOSIS — E669 Obesity, unspecified: Secondary | ICD-10-CM

## 2015-11-03 DIAGNOSIS — C189 Malignant neoplasm of colon, unspecified: Secondary | ICD-10-CM

## 2015-11-03 DIAGNOSIS — D649 Anemia, unspecified: Secondary | ICD-10-CM | POA: Diagnosis not present

## 2015-11-03 DIAGNOSIS — C182 Malignant neoplasm of ascending colon: Secondary | ICD-10-CM

## 2015-11-03 DIAGNOSIS — D1771 Benign lipomatous neoplasm of kidney: Secondary | ICD-10-CM

## 2015-11-03 DIAGNOSIS — Z803 Family history of malignant neoplasm of breast: Secondary | ICD-10-CM

## 2015-11-03 DIAGNOSIS — C7A8 Other malignant neuroendocrine tumors: Secondary | ICD-10-CM

## 2015-11-03 DIAGNOSIS — I10 Essential (primary) hypertension: Secondary | ICD-10-CM

## 2015-11-03 DIAGNOSIS — C7A022 Malignant carcinoid tumor of the ascending colon: Secondary | ICD-10-CM | POA: Diagnosis not present

## 2015-11-03 DIAGNOSIS — Z79899 Other long term (current) drug therapy: Secondary | ICD-10-CM

## 2015-11-03 DIAGNOSIS — M1990 Unspecified osteoarthritis, unspecified site: Secondary | ICD-10-CM

## 2015-11-03 DIAGNOSIS — R1011 Right upper quadrant pain: Secondary | ICD-10-CM

## 2015-11-03 DIAGNOSIS — Z5111 Encounter for antineoplastic chemotherapy: Secondary | ICD-10-CM | POA: Diagnosis not present

## 2015-11-03 LAB — COMPREHENSIVE METABOLIC PANEL
ALBUMIN: 3.8 g/dL (ref 3.5–5.0)
ALK PHOS: 91 U/L (ref 38–126)
ALT: 28 U/L (ref 14–54)
ANION GAP: 6 (ref 5–15)
AST: 26 U/L (ref 15–41)
BUN: 24 mg/dL — ABNORMAL HIGH (ref 6–20)
CALCIUM: 8.8 mg/dL — AB (ref 8.9–10.3)
CO2: 23 mmol/L (ref 22–32)
Chloride: 105 mmol/L (ref 101–111)
Creatinine, Ser: 0.93 mg/dL (ref 0.44–1.00)
GFR calc Af Amer: >60 mL/min (ref >60)
GFR calc non Af Amer: >60 mL/min (ref >60)
GLUCOSE: 91 mg/dL (ref 65–99)
POTASSIUM: 4.1 mmol/L (ref 3.5–5.1)
SODIUM: 134 mmol/L — AB (ref 135–145)
Total Bilirubin: 0.3 mg/dL (ref 0.3–1.2)
Total Protein: 8.3 g/dL — ABNORMAL HIGH (ref 6.5–8.1)

## 2015-11-03 LAB — CBC WITH DIFFERENTIAL/PLATELET
BASOS PCT: 1 %
Basophils Absolute: 0.1 10*3/uL (ref 0–0.1)
EOS ABS: 0 10*3/uL (ref 0–0.7)
EOS PCT: 0 %
HCT: 30.8 % — ABNORMAL LOW (ref 35.0–47.0)
Hemoglobin: 10.8 g/dL — ABNORMAL LOW (ref 12.0–16.0)
Lymphocytes Relative: 21 %
Lymphs Abs: 1.4 10*3/uL (ref 1.0–3.6)
MCH: 32.3 pg (ref 26.0–34.0)
MCHC: 35.1 g/dL (ref 32.0–36.0)
MCV: 91.9 fL (ref 80.0–100.0)
MONO ABS: 0.4 10*3/uL (ref 0.2–0.9)
MONOS PCT: 6 %
Neutro Abs: 4.8 10*3/uL (ref 1.4–6.5)
Neutrophils Relative %: 72 %
Platelets: 437 10*3/uL (ref 150–440)
RBC: 3.35 MIL/uL — ABNORMAL LOW (ref 3.80–5.20)
RDW: 14.1 % (ref 11.5–14.5)
WBC: 6.6 10*3/uL (ref 3.6–11.0)

## 2015-11-03 MED ORDER — SODIUM CHLORIDE 0.9 % IV SOLN
Freq: Once | INTRAVENOUS | Status: AC
  Administered 2015-11-03: 10:00:00 via INTRAVENOUS
  Filled 2015-11-03: qty 1000

## 2015-11-03 MED ORDER — SODIUM CHLORIDE 0.9 % IV SOLN
10.0000 mg | Freq: Once | INTRAVENOUS | Status: AC
  Administered 2015-11-03: 10 mg via INTRAVENOUS
  Filled 2015-11-03: qty 1

## 2015-11-03 MED ORDER — HEPARIN SOD (PORK) LOCK FLUSH 100 UNIT/ML IV SOLN
500.0000 [IU] | Freq: Once | INTRAVENOUS | Status: AC | PRN
  Administered 2015-11-03: 500 [IU]
  Filled 2015-11-03: qty 5

## 2015-11-03 MED ORDER — PALONOSETRON HCL INJECTION 0.25 MG/5ML
0.2500 mg | Freq: Once | INTRAVENOUS | Status: AC
  Administered 2015-11-03: 0.25 mg via INTRAVENOUS
  Filled 2015-11-03: qty 5

## 2015-11-03 MED ORDER — SODIUM CHLORIDE 0.9 % IV SOLN
533.5000 mg | Freq: Once | INTRAVENOUS | Status: AC
  Administered 2015-11-03: 530 mg via INTRAVENOUS
  Filled 2015-11-03: qty 53

## 2015-11-03 MED ORDER — ETOPOSIDE CHEMO INJECTION 1 GM/50ML
100.0000 mg/m2 | Freq: Once | INTRAVENOUS | Status: AC
  Administered 2015-11-03: 200 mg via INTRAVENOUS
  Filled 2015-11-03: qty 10

## 2015-11-03 NOTE — Progress Notes (Signed)
[place] NOTE  [name], [name], [name], [name], RN as Registered Nurse  CHIEF COMPLAINTS/PURPOSE OF CONSULTATION:   # MARCH/APRIL 2017-NEUROENDOCRINE CA STAGE IV; [name]; [name]; [name]; [name]; [name]; ascending colon uptake. April24th 2017 CARBO-ETOPOSIDE q3 W  # right kidney lesion 2.6 x 1.6 cm ? Angiolipoma [followed by Urology in past]  HISTORY OF PRESENTING ILLNESS:  Kathleen James 65 y.o.  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  Patient had mild nausea for which she took antiemetic resolved. No vomiting. Appetite is good. Denies any fevers or chills.  Notes to have some improvement of her right upper quadrant pain. No weight loss. No tingling and numbness. No headaches. No diarrhea mild constipation; took a laxative. Positive alopecia.  ROS: A complete 10 point review of system is done which is negative except mentioned above in history of present illness  MEDICAL HISTORY:  Past Medical History  Diagnosis Date  . Hypertension   . Obesity (BMI 30-39.9)   . Angiolipoma of kidney     followed with serial CTs ,,  Dr. Jacqlyn Larsen  . Hammer toe   . Arthritis   . Cancer [facility])     colon    SURGICAL HISTORY: Past Surgical History  Procedure Laterality Date  . Abdominal hysterectomy  1995    endometriosis, heavy bleeding  . Oophorectomy    . Rotator cuff repair Right 2008    right shoulder.  [facility]   . Cesarean section  1985  . Bunionectomy Bilateral   . Portacath placement Left 10/01/2015    Procedure: INSERTION  PORT-A-CATH;  Surgeon: Robert Bellow, MD;  Location: [facility] ORS;  Service: General;  Laterality: Left;  . Colonoscopy with propofol N/A 10/09/2015    Procedure: COLONOSCOPY WITH PROPOFOL;  Surgeon: Robert Bellow, MD;  Location: [facility] ENDOSCOPY;  Service: Endoscopy;  Laterality: N/A;    SOCIAL HISTORY: She lives at home with her family in [place]. 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 Social History   Social History  . Marital Status: Married    Spouse Name: N/A  . Number of Children: N/A  . Years of Education: N/A   Occupational History  . Not on file.   Social History Main Topics  . Smoking status: Never Smoker   . Smokeless tobacco: Never Used  . Alcohol Use: 0.0 oz/week    0 Standard drinks or equivalent per week     Comment: occasionally  . Drug Use: No  . Sexual Activity: Not Currently   Other Topics Concern  . Not on file   Social History Narrative    FAMILY HISTORY: Family History  Problem Relation Age of Onset  . Mental illness Mother 51    bipolar, dementia  . Cancer Maternal Aunt 70    breast ca  . Hypertension Father   . Stroke Father 36    deceased  . COPD Father   . Heart disease Sister     deceased    ALLERGIES:  is allergic to bee venom and morphine and related.  MEDICATIONS:  Current Outpatient Prescriptions  Medication Sig Dispense Refill  . ALPRAZolam (XANAX) 0.25 MG tablet Take 1 tablet (0.25 mg total) by mouth daily as needed for sleep or anxiety. 90 tablet 5  . amLODipine (NORVASC) 5 MG tablet Take 1 tablet (5 mg total) by mouth daily. 30 tablet 3  . diphenoxylate-atropine (LOMOTIL) 2.5-0.025 MG tablet Take 1 tablet by mouth 4 (four) times daily as needed for diarrhea or loose stools. 30 tablet 6  . lidocaine-prilocaine (EMLA) cream Apply 1 application topically as needed. Apply to port a cath site 1 hour prior to chemotherapy treatments 30 g 6  . olmesartan (BENICAR) 40 MG tablet Take 1  tablet (40 mg total) by mouth daily. 30 tablet 2  . ondansetron (ZOFRAN) 8 MG tablet Take 1 tablet (8 mg total) by mouth every 8 (eight) hours as needed for nausea or vomiting. 20 tablet 0  . prochlorperazine (COMPAZINE) 10 MG tablet Take 1 tablet (10 mg total) by mouth every 6 (six) hours as needed for nausea or vomiting. 30 tablet 6   No current facility-administered medications for this visit.      Marland Kitchen  PHYSICAL EXAMINATION: ECOG PERFORMANCE STATUS: 0 - Asymptomatic  Filed Vitals:   11/03/15 0900  BP: 136/85  Pulse: 88  Temp: 96.9 F (36.1 C)  Resp: 18   Filed Weights   11/03/15 0900  Weight: 200 lb 2.8 oz (90.8 kg)    GENERAL: Well-nourished well-developed; Alert, no distress and comfortable.  Accompanied by her daughter.  EYES: no pallor or icterus OROPHARYNX: no thrush or ulceration; good dentition  NECK: supple, no masses felt LYMPH:  no palpable lymphadenopathy in the cervical, axillary or inguinal regions LUNGS: clear to auscultation and  No wheeze or crackles HEART/CVS: regular rate & rhythm and no murmurs; No lower extremity edema ABDOMEN: abdomen soft, non-tender and normal bowel sounds; positive for hepatomegaly. Musculoskeletal:no cyanosis of digits and no clubbing  PSYCH: alert & oriented x 3 with fluent speech NEURO: no focal motor/sensory deficits SKIN:  no rashes or significant lesions; Mediport is in place. Positive for alopecia.  LABORATORY DATA:  I have reviewed the data as listed Lab Results  Component Value Date   WBC 6.6 11/03/2015   HGB 10.8* 11/03/2015   HCT 30.8* 11/03/2015   MCV 91.9 11/03/2015   PLT 437 11/03/2015    Recent Labs  10/13/15 0827 10/24/15 1410 11/03/15 0834  NA 137 135 134*  K 3.8 3.9 4.1  CL 109 103 105  CO2 _0 GLUCOSE 94 119* 91  BUN 21* 17 24*  CREATININE 0.78 0.97 0.93  CALCIUM 8.7* 8.9 8.8*  GFRNONAA >60 60* >60  GFRAA >60 >60 >60  PROT 7.7 8.1 8.3*  ALBUMIN 4.0 3.9 3.8  AST 32 28 26  ALT 28 52 28   ALKPHOS 68 90 91  BILITOT 0.4 0.2* 0.3    RADIOGRAPHIC STUDIES: I have personally reviewed the radiological images as listed and agreed with the findings in the report. Nm Pet Image Initial (pi) Skull Base To Thigh  10/06/2015  CLINICAL DATA:  Initial treatment strategy for malignant neoplasm of ascending colon. EXAM: NUCLEAR MEDICINE PET SKULL BASE TO THIGH TECHNIQUE: 11.5 mCi F-18 FDG was injected intravenously. Full-ring PET imaging was performed from the skull base to thigh after the radiotracer. CT data was obtained and used for attenuation correction and anatomic localization. FASTING BLOOD GLUCOSE:  Value: 68 mg/dl COMPARISON:  Abdominal pelvic CT of 09/17/2015. Chest radiograph 10/01/2015. FINDINGS: NECK  No areas of abnormal hypermetabolism. CHEST No areas of abnormal hypermetabolism. ABDOMEN/PELVIS Mild misregistration between PET and CT images. Bilateral hypermetabolic hepatic metastasis. Index lesion within the high right hepatic lobe measures on the order of 8.9 by 5.2 cm and a S.U.V. max of 42.7 on image 128/series 3. Ascending colonic primary with direct extension and localized nodal metastasis in the ileocolic mesenteric. This conglomerate measures a S.U.V. max of 41.9, including on image 180/series 3. SKELETON No abnormal marrow activity. CT IMAGES PERFORMED FOR ATTENUATION CORRECTION No cervical adenopathy. A left-sided Port-A-Cath which terminates at the low SVC. Carotid atherosclerosis. Mild cardiomegaly. Normal adrenal glands. Right renal angiomyolipoma. Aortic atherosclerosis. Scattered colonic diverticula. Hysterectomy. Osteopenia. IMPRESSION: 1. Ascending colonic primary with extensive hypermetabolic metastasis within the adjacent ileocolic mesentery and both lobes the liver. 2. No extra abdominal metastatic disease identified. Electronically Signed   By: Abigail Miyamoto M.D.   On: 10/06/2015 12:38    ASSESSMENT & PLAN:   # Ascending colon neuroendocrine carcinoma [Ki-67 60%]  Multiple lesions in the liver. Palliative first-line Carboplatin- etoposide every 3 weeks. Cycle #1 3weeks ago. Patient tolerated chemotherapy very well. Improvement of the abdominal pain noted-likely response. No obvious progression noted.  # Proceed with cycle #2 today. CBC within normal limits except for mild anemia hemoglobin 10.9. CMP-wnl. CEA from today is pending. We will plan to get a CT scan few days before cycle #3.  # Nausea- Grade-1.  # we'll proceed with cycle #3 again in 3 weeks.    [name], MD 11/03/2015 9:24 AM

## 2015-11-04 ENCOUNTER — Inpatient Hospital Stay: Payer: Medicare HMO

## 2015-11-04 VITALS — BP 130/84 | HR 81 | Temp 97.8°F | Resp 18

## 2015-11-04 DIAGNOSIS — C7A8 Other malignant neuroendocrine tumors: Secondary | ICD-10-CM

## 2015-11-04 DIAGNOSIS — C182 Malignant neoplasm of ascending colon: Secondary | ICD-10-CM

## 2015-11-04 DIAGNOSIS — C189 Malignant neoplasm of colon, unspecified: Secondary | ICD-10-CM

## 2015-11-04 DIAGNOSIS — C799 Secondary malignant neoplasm of unspecified site: Secondary | ICD-10-CM

## 2015-11-04 DIAGNOSIS — Z5111 Encounter for antineoplastic chemotherapy: Secondary | ICD-10-CM | POA: Diagnosis not present

## 2015-11-04 LAB — CEA: CEA: 860.5 ng/mL — ABNORMAL HIGH (ref 0.0–4.7)

## 2015-11-04 MED ORDER — HEPARIN SOD (PORK) LOCK FLUSH 100 UNIT/ML IV SOLN
500.0000 [IU] | Freq: Once | INTRAVENOUS | Status: AC | PRN
  Administered 2015-11-04: 500 [IU]
  Filled 2015-11-04 (×2): qty 5

## 2015-11-04 MED ORDER — SODIUM CHLORIDE 0.9 % IV SOLN
100.0000 mg/m2 | Freq: Once | INTRAVENOUS | Status: AC
  Administered 2015-11-04: 200 mg via INTRAVENOUS
  Filled 2015-11-04: qty 10

## 2015-11-04 MED ORDER — SODIUM CHLORIDE 0.9 % IV SOLN
10.0000 mg | Freq: Once | INTRAVENOUS | Status: AC
  Administered 2015-11-04: 10 mg via INTRAVENOUS
  Filled 2015-11-04: qty 1

## 2015-11-04 MED ORDER — SODIUM CHLORIDE 0.9 % IV SOLN
Freq: Once | INTRAVENOUS | Status: AC
  Administered 2015-11-04: 14:00:00 via INTRAVENOUS
  Filled 2015-11-04: qty 1000

## 2015-11-05 ENCOUNTER — Inpatient Hospital Stay: Payer: Medicare HMO

## 2015-11-05 VITALS — BP 119/78 | HR 91 | Resp 20

## 2015-11-05 DIAGNOSIS — Z5111 Encounter for antineoplastic chemotherapy: Secondary | ICD-10-CM | POA: Diagnosis not present

## 2015-11-05 DIAGNOSIS — C7A8 Other malignant neuroendocrine tumors: Secondary | ICD-10-CM

## 2015-11-05 DIAGNOSIS — C799 Secondary malignant neoplasm of unspecified site: Secondary | ICD-10-CM

## 2015-11-05 DIAGNOSIS — C189 Malignant neoplasm of colon, unspecified: Secondary | ICD-10-CM

## 2015-11-05 DIAGNOSIS — C182 Malignant neoplasm of ascending colon: Secondary | ICD-10-CM

## 2015-11-05 MED ORDER — SODIUM CHLORIDE 0.9 % IV SOLN
10.0000 mg | Freq: Once | INTRAVENOUS | Status: AC
  Administered 2015-11-05: 10 mg via INTRAVENOUS
  Filled 2015-11-05: qty 1

## 2015-11-05 MED ORDER — PEGFILGRASTIM 6 MG/0.6ML ~~LOC~~ PSKT
6.0000 mg | PREFILLED_SYRINGE | Freq: Once | SUBCUTANEOUS | Status: AC
  Administered 2015-11-05: 6 mg via SUBCUTANEOUS
  Filled 2015-11-05: qty 0.6

## 2015-11-05 MED ORDER — SODIUM CHLORIDE 0.9 % IV SOLN
Freq: Once | INTRAVENOUS | Status: AC
  Administered 2015-11-05: 14:00:00 via INTRAVENOUS
  Filled 2015-11-05: qty 1000

## 2015-11-05 MED ORDER — SODIUM CHLORIDE 0.9 % IV SOLN
100.0000 mg/m2 | Freq: Once | INTRAVENOUS | Status: AC
  Administered 2015-11-05: 200 mg via INTRAVENOUS
  Filled 2015-11-05: qty 10

## 2015-11-05 MED ORDER — HEPARIN SOD (PORK) LOCK FLUSH 100 UNIT/ML IV SOLN
INTRAVENOUS | Status: AC
  Filled 2015-11-05: qty 5

## 2015-11-12 ENCOUNTER — Telehealth: Payer: Self-pay | Admitting: *Deleted

## 2015-11-12 ENCOUNTER — Inpatient Hospital Stay: Payer: Medicare HMO

## 2015-11-12 DIAGNOSIS — Z5111 Encounter for antineoplastic chemotherapy: Secondary | ICD-10-CM | POA: Diagnosis not present

## 2015-11-12 DIAGNOSIS — C182 Malignant neoplasm of ascending colon: Secondary | ICD-10-CM

## 2015-11-12 LAB — CBC WITH DIFFERENTIAL/PLATELET
BASOS ABS: 0.1 10*3/uL (ref 0–0.1)
Basophils Relative: 1 %
Eosinophils Absolute: 0 10*3/uL (ref 0–0.7)
Eosinophils Relative: 1 %
HEMATOCRIT: 29.6 % — AB (ref 35.0–47.0)
Hemoglobin: 10.2 g/dL — ABNORMAL LOW (ref 12.0–16.0)
Lymphocytes Relative: 16 %
Lymphs Abs: 1.1 10*3/uL (ref 1.0–3.6)
MCH: 32.1 pg (ref 26.0–34.0)
MCHC: 34.5 g/dL (ref 32.0–36.0)
MCV: 93.1 fL (ref 80.0–100.0)
MONO ABS: 0.7 10*3/uL (ref 0.2–0.9)
Monocytes Relative: 10 %
NEUTROS PCT: 74 %
Neutro Abs: 5.4 10*3/uL (ref 1.4–6.5)
Platelets: 480 10*3/uL — ABNORMAL HIGH (ref 150–440)
RBC: 3.18 MIL/uL — AB (ref 3.80–5.20)
RDW: 14.4 % (ref 11.5–14.5)
WBC: 7.3 10*3/uL (ref 3.6–11.0)

## 2015-11-12 LAB — COMPREHENSIVE METABOLIC PANEL
ALT: 46 U/L (ref 14–54)
AST: 30 U/L (ref 15–41)
Albumin: 4.2 g/dL (ref 3.5–5.0)
Alkaline Phosphatase: 106 U/L (ref 38–126)
Anion gap: 5 (ref 5–15)
BILIRUBIN TOTAL: 0.4 mg/dL (ref 0.3–1.2)
BUN: 23 mg/dL — AB (ref 6–20)
CO2: 26 mmol/L (ref 22–32)
CREATININE: 1.03 mg/dL — AB (ref 0.44–1.00)
Calcium: 9 mg/dL (ref 8.9–10.3)
Chloride: 103 mmol/L (ref 101–111)
GFR calc Af Amer: >60 mL/min (ref >60)
GFR, EST NON AFRICAN AMERICAN: 56 mL/min — AB (ref >60)
Glucose, Bld: 90 mg/dL (ref 65–99)
Potassium: 4.1 mmol/L (ref 3.5–5.1)
Sodium: 134 mmol/L — ABNORMAL LOW (ref 135–145)
TOTAL PROTEIN: 8.3 g/dL — AB (ref 6.5–8.1)

## 2015-11-12 NOTE — Telephone Encounter (Signed)
-----   Message from [name], MD sent at 11/12/2015 12:27 PM EDT ----- Please inform pt- labs overall look okay; recommend more PO fluids intake/ gatorade- as her creatinine is slightly up. Thx

## 2015-11-12 NOTE — Telephone Encounter (Signed)
RN left vm msg asking pt to drink more fluids. I explained that the kidney function labs are slightly elevated.  I asked pt to call me back to confirm that she received these instructions.

## 2015-11-13 ENCOUNTER — Inpatient Hospital Stay: Payer: Medicare HMO

## 2015-11-21 ENCOUNTER — Ambulatory Visit
Admission: RE | Admit: 2015-11-21 | Discharge: 2015-11-21 | Disposition: A | Discharge status: Home / Self Care | Payer: Medicare HMO | Source: Ambulatory Visit | Attending: Internal Medicine | Admitting: Internal Medicine

## 2015-11-21 DIAGNOSIS — C182 Malignant neoplasm of ascending colon: Secondary | ICD-10-CM

## 2015-11-21 DIAGNOSIS — C787 Secondary malignant neoplasm of liver and intrahepatic bile duct: Secondary | ICD-10-CM | POA: Insufficient documentation

## 2015-11-21 DIAGNOSIS — C19 Malignant neoplasm of rectosigmoid junction: Secondary | ICD-10-CM | POA: Diagnosis not present

## 2015-11-21 MED ORDER — IOPAMIDOL (ISOVUE-300) INJECTION 61%
100.0000 mL | Freq: Once | INTRAVENOUS | Status: AC | PRN
  Administered 2015-11-21: 100 mL via INTRAVENOUS

## 2015-11-24 ENCOUNTER — Inpatient Hospital Stay (HOSPITAL_BASED_OUTPATIENT_CLINIC_OR_DEPARTMENT_OTHER): Payer: Medicare HMO | Admitting: Internal Medicine

## 2015-11-24 ENCOUNTER — Inpatient Hospital Stay: Payer: Medicare HMO | Attending: Internal Medicine

## 2015-11-24 ENCOUNTER — Inpatient Hospital Stay: Payer: Medicare HMO

## 2015-11-24 ENCOUNTER — Telehealth: Payer: Self-pay | Admitting: *Deleted

## 2015-11-24 VITALS — BP 135/85 | HR 79 | Temp 96.4°F | Resp 18 | Wt 203.7 lb

## 2015-11-24 DIAGNOSIS — K5900 Constipation, unspecified: Secondary | ICD-10-CM | POA: Insufficient documentation

## 2015-11-24 DIAGNOSIS — R1011 Right upper quadrant pain: Secondary | ICD-10-CM

## 2015-11-24 DIAGNOSIS — Z79899 Other long term (current) drug therapy: Secondary | ICD-10-CM | POA: Insufficient documentation

## 2015-11-24 DIAGNOSIS — R197 Diarrhea, unspecified: Secondary | ICD-10-CM

## 2015-11-24 DIAGNOSIS — C7B02 Secondary carcinoid tumors of liver: Secondary | ICD-10-CM

## 2015-11-24 DIAGNOSIS — M1990 Unspecified osteoarthritis, unspecified site: Secondary | ICD-10-CM | POA: Insufficient documentation

## 2015-11-24 DIAGNOSIS — C7A022 Malignant carcinoid tumor of the ascending colon: Secondary | ICD-10-CM | POA: Diagnosis not present

## 2015-11-24 DIAGNOSIS — C182 Malignant neoplasm of ascending colon: Secondary | ICD-10-CM

## 2015-11-24 DIAGNOSIS — R5383 Other fatigue: Secondary | ICD-10-CM | POA: Diagnosis not present

## 2015-11-24 DIAGNOSIS — R110 Nausea: Secondary | ICD-10-CM | POA: Insufficient documentation

## 2015-11-24 DIAGNOSIS — Z803 Family history of malignant neoplasm of breast: Secondary | ICD-10-CM | POA: Diagnosis not present

## 2015-11-24 DIAGNOSIS — I10 Essential (primary) hypertension: Secondary | ICD-10-CM

## 2015-11-24 DIAGNOSIS — Z5111 Encounter for antineoplastic chemotherapy: Secondary | ICD-10-CM | POA: Diagnosis not present

## 2015-11-24 DIAGNOSIS — R970 Elevated carcinoembryonic antigen [CEA]: Secondary | ICD-10-CM

## 2015-11-24 DIAGNOSIS — C7A8 Other malignant neuroendocrine tumors: Secondary | ICD-10-CM

## 2015-11-24 LAB — CBC WITH DIFFERENTIAL/PLATELET
BASOS PCT: 1 %
Basophils Absolute: 0 10*3/uL (ref 0–0.1)
Eosinophils Absolute: 0.1 10*3/uL (ref 0–0.7)
Eosinophils Relative: 1 %
HEMATOCRIT: 28.9 % — AB (ref 35.0–47.0)
Hemoglobin: 10.3 g/dL — ABNORMAL LOW (ref 12.0–16.0)
Lymphocytes Relative: 22 %
Lymphs Abs: 1.3 10*3/uL (ref 1.0–3.6)
MCH: 33.6 pg (ref 26.0–34.0)
MCHC: 35.7 g/dL (ref 32.0–36.0)
MCV: 94.1 fL (ref 80.0–100.0)
MONO ABS: 0.4 10*3/uL (ref 0.2–0.9)
MONOS PCT: 7 %
NEUTROS ABS: 4 10*3/uL (ref 1.4–6.5)
Neutrophils Relative %: 69 %
Platelets: 270 10*3/uL (ref 150–440)
RBC: 3.07 MIL/uL — ABNORMAL LOW (ref 3.80–5.20)
RDW: 17.3 % — AB (ref 11.5–14.5)
WBC: 5.8 10*3/uL (ref 3.6–11.0)

## 2015-11-24 LAB — COMPREHENSIVE METABOLIC PANEL
ALK PHOS: 79 U/L (ref 38–126)
ALT: 57 U/L — AB (ref 14–54)
AST: 41 U/L (ref 15–41)
Albumin: 3.7 g/dL (ref 3.5–5.0)
Anion gap: 5 (ref 5–15)
BUN: 19 mg/dL (ref 6–20)
CALCIUM: 8.7 mg/dL — AB (ref 8.9–10.3)
CO2: 23 mmol/L (ref 22–32)
CREATININE: 1 mg/dL (ref 0.44–1.00)
Chloride: 107 mmol/L (ref 101–111)
GFR, EST NON AFRICAN AMERICAN: 58 mL/min — AB (ref >60)
Glucose, Bld: 78 mg/dL (ref 65–99)
Potassium: 4.3 mmol/L (ref 3.5–5.1)
Sodium: 135 mmol/L (ref 135–145)
Total Bilirubin: 0.3 mg/dL (ref 0.3–1.2)
Total Protein: 8 g/dL (ref 6.5–8.1)

## 2015-11-24 MED ORDER — HEPARIN SOD (PORK) LOCK FLUSH 100 UNIT/ML IV SOLN
500.0000 [IU] | Freq: Once | INTRAVENOUS | Status: AC
  Administered 2015-11-24: 500 [IU] via INTRAVENOUS
  Filled 2015-11-24: qty 5

## 2015-11-24 MED ORDER — SODIUM CHLORIDE 0.9% FLUSH
10.0000 mL | INTRAVENOUS | Status: AC | PRN
  Administered 2015-11-24: 10 mL via INTRAVENOUS
  Filled 2015-11-24: qty 10

## 2015-11-24 NOTE — Progress Notes (Signed)
Holding chemotherapy today.  Plan to start new tx FOLFIRINOX

## 2015-11-24 NOTE — Telephone Encounter (Signed)
Patient notified that insurance approved new chemotherapy regimen. Pt instructed to keep the appointment on Wednesday for Folfirinox (NEW) as scheduled.

## 2015-11-24 NOTE — Progress Notes (Signed)
[facility] NOTE  [name], [name], [name], [name], RN as Registered Nurse  CHIEF COMPLAINTS/PURPOSE OF CONSULTATION:   # MARCH/APRIL 2017-NEUROENDOCRINE CA STAGE IV; [name]; [name]; [name]; [name]; [name]; [name]; [name]  # right kidney lesion 2.6 x 1.6 cm ? Angiolipoma [followed by Urology in past]  HISTORY OF PRESENTING ILLNESS:  Kathleen James 65 y.o.  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  Patient has mild nausea no vomiting.  Good appetite. Denies any fevers or chills. She had been on the beach over the weekend; no issues.  Noted improvement of the right upper quadrant pain; currently not needing to take any pain medication. Intermittent constipation or diarrhea. No skin rash.  ROS: A complete 10 point review of system is done which is negative except mentioned above in history of present illness  MEDICAL HISTORY:  Past Medical History  Diagnosis Date  . Hypertension   . Obesity (BMI 30-39.9)   . Angiolipoma of kidney     followed with serial CTs ,,  Dr. Jacqlyn Larsen  . Hammer toe   . Arthritis   . Cancer [facility])     colon    SURGICAL HISTORY: Past Surgical History  Procedure Laterality Date  . Abdominal hysterectomy  1995    endometriosis, heavy bleeding  . Oophorectomy    . Rotator cuff repair Right 2008    right shoulder.  [place]   . Cesarean section  1985  . Bunionectomy Bilateral   . Portacath placement  Left 10/01/2015    Procedure: INSERTION PORT-A-CATH;  Surgeon: Robert Bellow, MD;  Location: [facility] ORS;  Service: General;  Laterality: Left;  . Colonoscopy with propofol N/A 10/09/2015    Procedure: COLONOSCOPY WITH PROPOFOL;  Surgeon: Robert Bellow, MD;  Location: [facility] ENDOSCOPY;  Service: Endoscopy;  Laterality: N/A;    SOCIAL HISTORY: She lives at home with her family in [place]. 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 Social History   Social History  . Marital Status: Married    Spouse Name: N/A  . Number of Children: N/A  . Years of Education: N/A   Occupational History  . Not on file.   Social History Main Topics  . Smoking status: Never Smoker   . Smokeless tobacco: Never Used  . Alcohol Use: 0.0 oz/week    0 Standard drinks or equivalent per week     Comment: occasionally  . Drug Use: No  . Sexual Activity: Not Currently   Other Topics Concern  . Not on file   Social History Narrative    FAMILY HISTORY: Family History  Problem Relation Age of Onset  . Mental illness Mother 8    bipolar, dementia  . Cancer Maternal Aunt 70    breast ca  . Hypertension Father   . Stroke Father 49    deceased  . COPD Father   . Heart disease Sister     deceased  ALLERGIES:  is allergic to bee venom and morphine and related.  MEDICATIONS:  Current Outpatient Prescriptions  Medication Sig Dispense Refill  . ALPRAZolam (XANAX) 0.25 MG tablet Take 1 tablet (0.25 mg total) by mouth daily as needed for sleep or anxiety. 90 tablet 5  . amLODipine (NORVASC) 5 MG tablet Take 1 tablet (5 mg total) by mouth daily. 30 tablet 3  . diphenoxylate-atropine (LOMOTIL) 2.5-0.025 MG tablet Take 1 tablet by mouth 4 (four) times daily as needed for diarrhea or loose stools. 30 tablet 6  . lidocaine-prilocaine (EMLA) cream Apply 1 application topically as needed. Apply to port a cath site 1 hour prior to chemotherapy treatments 30 g 6  .  olmesartan (BENICAR) 40 MG tablet Take 1 tablet (40 mg total) by mouth daily. 30 tablet 2  . ondansetron (ZOFRAN) 8 MG tablet Take 1 tablet (8 mg total) by mouth every 8 (eight) hours as needed for nausea or vomiting. 20 tablet 0  . prochlorperazine (COMPAZINE) 10 MG tablet Take 1 tablet (10 mg total) by mouth every 6 (six) hours as needed for nausea or vomiting. 30 tablet 6   No current facility-administered medications for this visit.   Facility-Administered Medications Ordered in Other Visits  Medication Dose Route Frequency Provider Last Rate Last Dose  . sodium chloride flush (NS) 0.9 % injection 10 mL  10 mL Intravenous PRN [name], MD   10 mL at 11/24/15 0836      .  PHYSICAL EXAMINATION: ECOG PERFORMANCE STATUS: 0 - Asymptomatic  Filed Vitals:   11/24/15 0843  BP: 135/85  Pulse: 79  Temp: 96.4 F (35.8 C)  Resp: 18   Filed Weights   11/24/15 0843  Weight: 203 lb 11.3 oz (92.4 kg)    GENERAL: Well-nourished well-developed; Alert, no distress and comfortable.  Accompanied by her daughter/husband.  EYES: no pallor or icterus OROPHARYNX: no thrush or ulceration; good dentition  NECK: supple, no masses felt LYMPH:  no palpable lymphadenopathy in the cervical, axillary or inguinal regions LUNGS: clear to auscultation and  No wheeze or crackles HEART/CVS: regular rate & rhythm and no murmurs; No lower extremity edema ABDOMEN: abdomen soft, non-tender and normal bowel sounds; positive for hepatomegaly. Musculoskeletal:no cyanosis of digits and no clubbing  PSYCH: alert & oriented x 3 with fluent speech NEURO: no focal motor/sensory deficits SKIN:  no rashes or significant lesions; Mediport is in place. Positive for alopecia.  LABORATORY DATA:  I have reviewed the data as listed Lab Results  Component Value Date   WBC 5.8 11/24/2015   HGB 10.3* 11/24/2015   HCT 28.9* 11/24/2015   MCV 94.1 11/24/2015   PLT 270 11/24/2015    Recent Labs  11/03/15 0834  11/12/15 1116 11/24/15 0836  NA 134* 134* 135  K 4.1 4.1 4.3  CL 105 103 107  CO2 '23 26 23  ' GLUCOSE 91 90 78  BUN 24* 23* 19  CREATININE 0.93 1.03* 1.00  CALCIUM 8.8* 9.0 8.7*  GFRNONAA >60 56* 58*  GFRAA >60 >60 >60  PROT 8.3* 8.3* 8.0  ALBUMIN 3.8 4.2 3.7  AST 26 30 41  ALT 28 46 57*  ALKPHOS 91 106 79  BILITOT 0.3 0.4 0.3    RADIOGRAPHIC STUDIES: I have personally reviewed the radiological images as listed and agreed with the findings in the report. Ct Abdomen Pelvis W Contrast  11/21/2015  CLINICAL DATA:  Colorectal carcinoma with liver metastasis. Evaluate response to chemotherapy. EXAM: CT ABDOMEN AND PELVIS WITH  CONTRAST TECHNIQUE: Multidetector CT imaging of the abdomen and pelvis was performed using the standard protocol following bolus administration of intravenous contrast. CONTRAST:  135m ISOVUE-300 IOPAMIDOL (ISOVUE-300) INJECTION 61% COMPARISON:  PET-CT 10/06/2015, CT 09/17/2015 FINDINGS: Lower chest: Lung bases are clear. Hepatobiliary: For comparison of size, the hepatic lesions are best compared to the contrast CT of 09/17/2015. The dominant lesion in the RIGHT hepatic lobe measures 9.9 x 6.1 cm compared 8.3 x 5.4 cm. Adjacent RIGHT hepatic lobe lesion measures 3.6 x 3.2 cm compared to 2.9 x 0 2.5 cm remeasured. Lesion in the central LEFT hepatic lobe measures 2.7 by 3.0 cm increased from 1.7 x 1.6 cm. Subcapsular lesion in the LEFT hepatic lobe measures 1.6 cm compared to 1.3 cm. Potential new lesion measures 6 mm on image 27, series 2 with the RIGHT hepatic lobe Pancreas: Pancreas is normal. No ductal dilatation. No pancreatic inflammation. Spleen: Normal spleen Adrenals/urinary tract: Adrenal glands are normal. Exophytic lesion from the mid RIGHT renal cortex has fat attenuation on comparison noncontrast CT, measures 21 mm unchanged, and is most consists with benign angiomyolipoma. Ureters and bladder normal. Stomach/Bowel: Stomach and small bowel are normal.  Circumferential lesion in the cecum at the level of the ileocecal bowel is difficult to measure but does not appear appreciably changed (image 50, series 2). There is bulky adenopathy in the ileocecal mesentery. For example 3.0 cm lymph node (image 45, series 2) compares to 3.2 cm. 1.7 cm short axis node compares a 1.5 cm. There is no evidence of bowel obstruction. Distal colon rectum appear normal appear Vascular/Lymphatic: Abdominal aorta normal caliber. Bulky ileo cecal mesenteric adenopathy unchanged from prior described in the GI section. No new adenopathy evident. Reproductive: Post hysterectomy Other: No peritoneal metastasis. Musculoskeletal: No aggressive osseous lesion. IMPRESSION: 1. Interval increase in size of hepatic metastasis. 2. Stable mass in the ascending colon. No evidence bowel obstruction 3. Stable bulky adenopathy in the ileocecal mesentery. Electronically Signed   By: SSuzy BouchardM.D.   On: 11/21/2015 10:31    ASSESSMENT & PLAN:   # Ascending colon neuroendocrine carcinoma [Ki-67 60%] Multiple lesions in the liver. Palliative first-line Carboplatin- etoposide every 3 weeks. Cycle #2 apx 3weeks ago. Patient tolerated chemotherapy very well. Improvement of the abdominal pain noted. However CT scan shows increase of the liver lesions. CEA has also jumped from 500 to 800.   # Given the progression I would recommend second line chemotherapy. Given the rare type of tumor/progression on first-line therapy- medical recommendation is to start on FOLFIRINOX; she understands is currently palliative/tumor control.   # Proceed with cycle #1 this week.  # Again discussed the potential side effects especially diarrhea and fatigue. Also recommend taking Lomotil 45 minutes to 60 minutes prior to starting chemotherapy to avoid the flushing from irinotecan.  # Patient follow-up with me in approximately 2 weeks/CBC CMP. [facility]. The above  plan of care was discussed with the patient and family in detail. Reviewed the CT scan images myself and with the patient's family in detail. Copy of the report given.     GCammie Sickle MD 11/24/2015 11:11 AM

## 2015-11-25 ENCOUNTER — Ambulatory Visit: Payer: Self-pay

## 2015-11-25 ENCOUNTER — Other Ambulatory Visit: Payer: Self-pay | Admitting: Internal Medicine

## 2015-11-25 ENCOUNTER — Inpatient Hospital Stay: Payer: Medicare HMO

## 2015-11-25 ENCOUNTER — Other Ambulatory Visit: Payer: Self-pay

## 2015-11-25 ENCOUNTER — Ambulatory Visit: Payer: Self-pay | Admitting: Oncology

## 2015-11-26 ENCOUNTER — Inpatient Hospital Stay: Payer: Medicare HMO

## 2015-11-26 ENCOUNTER — Ambulatory Visit: Payer: Self-pay | Admitting: Internal Medicine

## 2015-11-26 VITALS — BP 148/89 | HR 84 | Temp 97.1°F | Resp 20

## 2015-11-26 DIAGNOSIS — C7A8 Other malignant neuroendocrine tumors: Secondary | ICD-10-CM

## 2015-11-26 DIAGNOSIS — C799 Secondary malignant neoplasm of unspecified site: Secondary | ICD-10-CM | POA: Diagnosis not present

## 2015-11-26 DIAGNOSIS — Z5111 Encounter for antineoplastic chemotherapy: Secondary | ICD-10-CM | POA: Diagnosis not present

## 2015-11-26 DIAGNOSIS — C182 Malignant neoplasm of ascending colon: Secondary | ICD-10-CM

## 2015-11-26 DIAGNOSIS — C189 Malignant neoplasm of colon, unspecified: Secondary | ICD-10-CM

## 2015-11-26 MED ORDER — SODIUM CHLORIDE 0.9 % IV SOLN
10.0000 mg | Freq: Once | INTRAVENOUS | Status: AC
  Administered 2015-11-26: 10 mg via INTRAVENOUS
  Filled 2015-11-26: qty 1

## 2015-11-26 MED ORDER — SODIUM CHLORIDE 0.9 % IV SOLN
2400.0000 mg/m2 | INTRAVENOUS | Status: DC
  Administered 2015-11-26: 4900 mg via INTRAVENOUS
  Filled 2015-11-26: qty 98

## 2015-11-26 MED ORDER — HEPARIN SOD (PORK) LOCK FLUSH 100 UNIT/ML IV SOLN: 500.0000 [IU] | Freq: Once | INTRAVENOUS | Status: DC | PRN

## 2015-11-26 MED ORDER — PALONOSETRON HCL INJECTION 0.25 MG/5ML
0.2500 mg | Freq: Once | INTRAVENOUS | Status: AC
  Administered 2015-11-26: 0.25 mg via INTRAVENOUS
  Filled 2015-11-26: qty 5

## 2015-11-26 MED ORDER — ATROPINE SULFATE 1 MG/ML IJ SOLN
0.5000 mg | Freq: Once | INTRAMUSCULAR | Status: AC | PRN
Start: 2015-11-26 — End: 2015-11-26
  Administered 2015-11-26: 0.5 mg via INTRAVENOUS
  Filled 2015-11-26: qty 1

## 2015-11-26 MED ORDER — OXALIPLATIN CHEMO INJECTION 100 MG/20ML
85.0000 mg/m2 | Freq: Once | INTRAVENOUS | Status: AC
  Administered 2015-11-26: 175 mg via INTRAVENOUS
  Filled 2015-11-26: qty 35

## 2015-11-26 MED ORDER — SODIUM CHLORIDE 0.9% FLUSH
10.0000 mL | INTRAVENOUS | Status: DC | PRN
  Administered 2015-11-26: 10 mL
  Filled 2015-11-26: qty 10

## 2015-11-26 MED ORDER — LEUCOVORIN CALCIUM INJECTION 350 MG
800.0000 mg | Freq: Once | INTRAVENOUS | Status: AC
  Administered 2015-11-26: 800 mg via INTRAVENOUS
  Filled 2015-11-26: qty 25

## 2015-11-26 MED ORDER — DEXTROSE 5 % IV SOLN
Freq: Once | INTRAVENOUS | Status: AC
  Administered 2015-11-26: 10:00:00 via INTRAVENOUS
  Filled 2015-11-26: qty 1000

## 2015-11-26 MED ORDER — IRINOTECAN HCL CHEMO INJECTION 100 MG/5ML
260.0000 mg | Freq: Once | INTRAVENOUS | Status: AC
  Administered 2015-11-26: 260 mg via INTRAVENOUS
  Filled 2015-11-26: qty 13

## 2015-11-26 NOTE — Patient Instructions (Signed)
Irinotecan Liposomal injection What is this medicine? IRINOTECAN LIPOSOME (eye ri noe TEE kan LIP oh som) is a chemotherapy drug. It is used to treat pancreatic cancer. This medicine may be used for other purposes; ask your health care provider or pharmacist if you have questions. What should I tell my health care provider before I take this medicine? They need to know if you have any of these conditions: -bleeding disorders -dehydration -history of blood diseases, like sickle cell anemia or leukemia -history of low levels of calcium, magnesium, or potassium in the blood -infection (especially a virus infection such as chickenpox, cold sores, or herpes) -liver disease -low blood counts, like low white cell, platelet, or red cell counts -lung or breathing disease, like asthma -recent or ongoing radiation therapy -an unusual or allergic reaction to irinotecan liposome, other medicines, foods, dyes, or preservatives -pregnant or trying to get pregnant -breast-feeding How should I use this medicine? This drug is given as an infusion into a vein. It is administered in a hospital or clinic by a specially trained health care professional. Talk to your pediatrician regarding the use of this medicine in children. Special care may be needed. Overdosage: If you think you have taken too much of this medicine contact a poison control center or emergency room at once. NOTE: This medicine is only for you. Do not share this medicine with others. What if I miss a dose? It is important not to miss your dose. Call your doctor or health care professional if you are unable to keep an appointment. What may interact with this medicine? This medicine may interact with the following medications: -antiviral medicines for HIV or AIDS -certain medications for fungal infections like ketoconazole, itraconazole, voriconazole -certain medications for seizures like carbamazepine, fosphenytoin,  phenytoin -clarithromycin -gemfibrozil -mephobarbital -nefazodone -phenobarbital -primidone -rifabutin -rifampin -rifapentine -St. John's Wort -telaprevir This list may not describe all possible interactions. Give your health care provider a list of all the medicines, herbs, non-prescription drugs, or dietary supplements you use. Also tell them if you smoke, drink alcohol, or use illegal drugs. Some items may interact with your medicine. What should I watch for while using this medicine? Check with your doctor or health care professional if you get an attack of severe diarrhea, nausea and vomiting, or if you sweat a lot. The loss of too much body fluid can make it dangerous for you to take this medicine. This medicine may interfere with the ability to have a child. You should talk with your doctor or health care professional if you are concerned about your fertility. Do not become pregnant while taking this medicine or for 1 month after the last dose; males with female partners should use condoms during treatment and for 4 months after the last dose. Women should inform their doctor if they wish to become pregnant or think they might be pregnant. There is a potential for serious side effects to an unborn child. Talk to your health care professional or pharmacist for more information. Do not breast-feed an infant while taking this medicine or for 1 month after the last dose. Avoid taking products that contain aspirin, acetaminophen, ibuprofen, naproxen, or ketoprofen unless instructed by your doctor. These medicines may hide a fever. Be careful brushing and flossing your teeth or using a toothpick because you may get an infection or bleed more easily. If you have any dental work done, tell your dentist you are receiving this medicine. Call your doctor or health care professional for advice  if you get a fever, chills or sore throat, or other symptoms of a cold or flu. Do not treat yourself. This  drug decreases your body's ability to fight infections. Try to avoid being around people who are sick. This medicine may increase your risk to bruise or bleed. Call your doctor or health care professional if you notice any unusual bleeding. This drug may make you feel generally unwell. This is not uncommon, as chemotherapy can affect healthy cells as well as cancer cells. Report any side effects. Continue your course of treatment even though you feel ill unless your doctor tells you to stop. What side effects may I notice from receiving this medicine? Side effects that you should report to your doctor or health care professional as soon as possible: -allergic reactions like skin rash, itching or hives, swelling of the face, lips, or tongue -breathing problems -cough -diarrhea -low blood counts - this medicine may decrease the number of white blood cells, red blood cells and platelets. You may be at increased risk for infections and bleeding -nausea, vomiting -signs and symptoms of bleeding such as bloody or black, tarry stools; red or dark-brown urine; spitting up blood or brown material that looks like coffee grounds; red spots on the skin; unusual bruising or bleeding from the eye, gums, or nose -signs of decreased red blood cells - unusually weak or tired, feeling faint or lightheaded, falls -signs and symptoms of infection like fever or chills; cough; sore throat; pain or trouble passing urine -signs and symptoms of liver injury like dark yellow or brown urine; general ill feeling or flu-like symptoms; light-colored stools; loss of appetite; nausea; right upper belly pain; unusually weak or tired; yellowing of the eyes or skin -stomach painSide effects that usually do not require medical attention (Report these to your doctor or health care professional if they continue or are bothersome): -hair loss -loss of appetite -mouth sores -upset stomach This list may not describe all possible side  effects. Call your doctor for medical advice about side effects. You may report side effects to FDA at 1-800-FDA-1088. Where should I keep my medicine? This drug is given in a hospital or clinic and will not be stored at home. NOTE: This sheet is a summary. It may not cover all possible information. If you have questions about this medicine, talk to your doctor, pharmacist, or health care provider.    2016, [employer]. (2014-05-14 11:38:47) Oxaliplatin Injection What is this medicine? OXALIPLATIN (ox AL i PLA tin) is a chemotherapy drug. It targets fast dividing cells, like cancer cells, and causes these cells to die. This medicine is used to treat cancers of the colon and rectum, and many other cancers. This medicine may be used for other purposes; ask your health care provider or pharmacist if you have questions. What should I tell my health care provider before I take this medicine? They need to know if you have any of these conditions: -kidney disease -an unusual or allergic reaction to oxaliplatin, other chemotherapy, other medicines, foods, dyes, or preservatives -pregnant or trying to get pregnant -breast-feeding How should I use this medicine? This drug is given as an infusion into a vein. It is administered in a hospital or clinic by a specially trained health care professional. Talk to your pediatrician regarding the use of this medicine in children. Special care may be needed. Overdosage: If you think you have taken too much of this medicine contact a poison control center or emergency room at  once. NOTE: This medicine is only for you. Do not share this medicine with others. What if I miss a dose? It is important not to miss a dose. Call your doctor or health care professional if you are unable to keep an appointment. What may interact with this medicine? -medicines to increase blood counts like filgrastim, pegfilgrastim, sargramostim -probenecid -some antibiotics  like amikacin, gentamicin, neomycin, polymyxin B, streptomycin, tobramycin -zalcitabine Talk to your doctor or health care professional before taking any of these medicines: -acetaminophen -aspirin -ibuprofen -ketoprofen -naproxen This list may not describe all possible interactions. Give your health care provider a list of all the medicines, herbs, non-prescription drugs, or dietary supplements you use. Also tell them if you smoke, drink alcohol, or use illegal drugs. Some items may interact with your medicine. What should I watch for while using this medicine? Your condition will be monitored carefully while you are receiving this medicine. You will need important blood work done while you are taking this medicine. This medicine can make you more sensitive to cold. Do not drink cold drinks or use ice. Cover exposed skin before coming in contact with cold temperatures or cold objects. When out in cold weather wear warm clothing and cover your mouth and nose to warm the air that goes into your lungs. Tell your doctor if you get sensitive to the cold. This drug may make you feel generally unwell. This is not uncommon, as chemotherapy can affect healthy cells as well as cancer cells. Report any side effects. Continue your course of treatment even though you feel ill unless your doctor tells you to stop. In some cases, you may be given additional medicines to help with side effects. Follow all directions for their use. Call your doctor or health care professional for advice if you get a fever, chills or sore throat, or other symptoms of a cold or flu. Do not treat yourself. This drug decreases your body's ability to fight infections. Try to avoid being around people who are sick. This medicine may increase your risk to bruise or bleed. Call your doctor or health care professional if you notice any unusual bleeding. Be careful brushing and flossing your teeth or using a toothpick because you may get an  infection or bleed more easily. If you have any dental work done, tell your dentist you are receiving this medicine. Avoid taking products that contain aspirin, acetaminophen, ibuprofen, naproxen, or ketoprofen unless instructed by your doctor. These medicines may hide a fever. Do not become pregnant while taking this medicine. Women should inform their doctor if they wish to become pregnant or think they might be pregnant. There is a potential for serious side effects to an unborn child. Talk to your health care professional or pharmacist for more information. Do not breast-feed an infant while taking this medicine. Call your doctor or health care professional if you get diarrhea. Do not treat yourself. What side effects may I notice from receiving this medicine? Side effects that you should report to your doctor or health care professional as soon as possible: -allergic reactions like skin rash, itching or hives, swelling of the face, lips, or tongue -low blood counts - This drug may decrease the number of white blood cells, red blood cells and platelets. You may be at increased risk for infections and bleeding. -signs of infection - fever or chills, cough, sore throat, pain or difficulty passing urine -signs of decreased platelets or bleeding - bruising, pinpoint red spots on the skin,  black, tarry stools, nosebleeds -signs of decreased red blood cells - unusually weak or tired, fainting spells, lightheadedness -breathing problems -chest pain, pressure -cough -diarrhea -jaw tightness -mouth sores -nausea and vomiting -pain, swelling, redness or irritation at the injection site -pain, tingling, numbness in the hands or feet -problems with balance, talking, walking -redness, blistering, peeling or loosening of the skin, including inside the mouth -trouble passing urine or change in the amount of urine Side effects that usually do not require medical attention (report to your doctor or health  care professional if they continue or are bothersome): -changes in vision -constipation -hair loss -loss of appetite -metallic taste in the mouth or changes in taste -stomach pain This list may not describe all possible side effects. Call your doctor for medical advice about side effects. You may report side effects to FDA at 1-800-FDA-1088. Where should I keep my medicine? This drug is given in a hospital or clinic and will not be stored at home. NOTE: This sheet is a summary. It may not cover all possible information. If you have questions about this medicine, talk to your doctor, pharmacist, or health care provider.    2016, [employer]. (2008-01-02 17:22:47) Fluorouracil, 5-FU injection What is this medicine? FLUOROURACIL, 5-FU (flure oh YOOR a sil) is a chemotherapy drug. It slows the growth of cancer cells. This medicine is used to treat many types of cancer like breast cancer, colon or rectal cancer, pancreatic cancer, and stomach cancer. This medicine may be used for other purposes; ask your health care provider or pharmacist if you have questions. What should I tell my health care provider before I take this medicine? They need to know if you have any of these conditions: -blood disorders -dihydropyrimidine dehydrogenase (DPD) deficiency -infection (especially a virus infection such as chickenpox, cold sores, or herpes) -kidney disease -liver disease -malnourished, poor nutrition -recent or ongoing radiation therapy -an unusual or allergic reaction to fluorouracil, other chemotherapy, other medicines, foods, dyes, or preservatives -pregnant or trying to get pregnant -breast-feeding How should I use this medicine? This drug is given as an infusion or injection into a vein. It is administered in a hospital or clinic by a specially trained health care professional. Talk to your pediatrician regarding the use of this medicine in children. Special care may be  needed. Overdosage: If you think you have taken too much of this medicine contact a poison control center or emergency room at once. NOTE: This medicine is only for you. Do not share this medicine with others. What if I miss a dose? It is important not to miss your dose. Call your doctor or health care professional if you are unable to keep an appointment. What may interact with this medicine? -allopurinol -cimetidine -dapsone -digoxin -hydroxyurea -leucovorin -levamisole -medicines for seizures like ethotoin, fosphenytoin, phenytoin -medicines to increase blood counts like filgrastim, pegfilgrastim, sargramostim -medicines that treat or prevent blood clots like warfarin, enoxaparin, and dalteparin -methotrexate -metronidazole -pyrimethamine -some other chemotherapy drugs like busulfan, cisplatin, estramustine, vinblastine -trimethoprim -trimetrexate -vaccines Talk to your doctor or health care professional before taking any of these medicines: -acetaminophen -aspirin -ibuprofen -ketoprofen -naproxen This list may not describe all possible interactions. Give your health care provider a list of all the medicines, herbs, non-prescription drugs, or dietary supplements you use. Also tell them if you smoke, drink alcohol, or use illegal drugs. Some items may interact with your medicine. What should I watch for while using this medicine? Visit your doctor for checks on  your progress. This drug may make you feel generally unwell. This is not uncommon, as chemotherapy can affect healthy cells as well as cancer cells. Report any side effects. Continue your course of treatment even though you feel ill unless your doctor tells you to stop. In some cases, you may be given additional medicines to help with side effects. Follow all directions for their use. Call your doctor or health care professional for advice if you get a fever, chills or sore throat, or other symptoms of a cold or flu. Do not  treat yourself. This drug decreases your body's ability to fight infections. Try to avoid being around people who are sick. This medicine may increase your risk to bruise or bleed. Call your doctor or health care professional if you notice any unusual bleeding. Be careful brushing and flossing your teeth or using a toothpick because you may get an infection or bleed more easily. If you have any dental work done, tell your dentist you are receiving this medicine. Avoid taking products that contain aspirin, acetaminophen, ibuprofen, naproxen, or ketoprofen unless instructed by your doctor. These medicines may hide a fever. Do not become pregnant while taking this medicine. Women should inform their doctor if they wish to become pregnant or think they might be pregnant. There is a potential for serious side effects to an unborn child. Talk to your health care professional or pharmacist for more information. Do not breast-feed an infant while taking this medicine. Men should inform their doctor if they wish to father a child. This medicine may lower sperm counts. Do not treat diarrhea with over the counter products. Contact your doctor if you have diarrhea that lasts more than 2 days or if it is severe and watery. This medicine can make you more sensitive to the sun. Keep out of the sun. If you cannot avoid being in the sun, wear protective clothing and use sunscreen. Do not use sun lamps or tanning beds/booths. What side effects may I notice from receiving this medicine? Side effects that you should report to your doctor or health care professional as soon as possible: -allergic reactions like skin rash, itching or hives, swelling of the face, lips, or tongue -low blood counts - this medicine may decrease the number of white blood cells, red blood cells and platelets. You may be at increased risk for infections and bleeding. -signs of infection - fever or chills, cough, sore throat, pain or difficulty  passing urine -signs of decreased platelets or bleeding - bruising, pinpoint red spots on the skin, black, tarry stools, blood in the urine -signs of decreased red blood cells - unusually weak or tired, fainting spells, lightheadedness -breathing problems -changes in vision -chest pain -mouth sores -nausea and vomiting -pain, swelling, redness at site where injected -pain, tingling, numbness in the hands or feet -redness, swelling, or sores on hands or feet -stomach pain -unusual bleeding Side effects that usually do not require medical attention (report to your doctor or health care professional if they continue or are bothersome): -changes in finger or toe nails -diarrhea -dry or itchy skin -hair loss -headache -loss of appetite -sensitivity of eyes to the light -stomach upset -unusually teary eyes This list may not describe all possible side effects. Call your doctor for medical advice about side effects. You may report side effects to FDA at 1-800-FDA-1088. Where should I keep my medicine? This drug is given in a hospital or clinic and will not be stored at home. NOTE: This  sheet is a summary. It may not cover all possible information. If you have questions about this medicine, talk to your doctor, pharmacist, or health care provider.    2016, [employer]. (2007-10-11 13:53:16) Leucovorin injection What is this medicine? LEUCOVORIN (loo koe VOR in) is used to prevent or treat the harmful effects of some medicines. This medicine is used to treat anemia caused by a low amount of folic acid in the body. It is also used with 5-fluorouracil (5-FU) to treat colon cancer. This medicine may be used for other purposes; ask your health care provider or pharmacist if you have questions. What should I tell my health care provider before I take this medicine? They need to know if you have any of these conditions: -anemia from low levels of vitamin B-12 in the blood -an unusual  or allergic reaction to leucovorin, folic acid, other medicines, foods, dyes, or preservatives -pregnant or trying to get pregnant -breast-feeding How should I use this medicine? This medicine is for injection into a muscle or into a vein. It is given by a health care professional in a hospital or clinic setting. Talk to your pediatrician regarding the use of this medicine in children. Special care may be needed. Overdosage: If you think you have taken too much of this medicine contact a poison control center or emergency room at once. NOTE: This medicine is only for you. Do not share this medicine with others. What if I miss a dose? This does not apply. What may interact with this medicine? -capecitabine -fluorouracil -phenobarbital -phenytoin -primidone -trimethoprim-sulfamethoxazole This list may not describe all possible interactions. Give your health care provider a list of all the medicines, herbs, non-prescription drugs, or dietary supplements you use. Also tell them if you smoke, drink alcohol, or use illegal drugs. Some items may interact with your medicine. What should I watch for while using this medicine? Your condition will be monitored carefully while you are receiving this medicine. This medicine may increase the side effects of 5-fluorouracil, 5-FU. Tell your doctor or health care professional if you have diarrhea or mouth sores that do not get better or that get worse. What side effects may I notice from receiving this medicine? Side effects that you should report to your doctor or health care professional as soon as possible: -allergic reactions like skin rash, itching or hives, swelling of the face, lips, or tongue -breathing problems -fever, infection -mouth sores -unusual bleeding or bruising -unusually weak or tired Side effects that usually do not require medical attention (report to your doctor or health care professional if they continue or are  bothersome): -constipation or diarrhea -loss of appetite -nausea, vomiting This list may not describe all possible side effects. Call your doctor for medical advice about side effects. You may report side effects to FDA at 1-800-FDA-1088. Where should I keep my medicine? This drug is given in a hospital or clinic and will not be stored at home. NOTE: This sheet is a summary. It may not cover all possible information. If you have questions about this medicine, talk to your doctor, pharmacist, or health care provider.    2016, [employer]. (2007-12-12 16:50:29)

## 2015-11-27 ENCOUNTER — Ambulatory Visit: Payer: Self-pay

## 2015-11-27 ENCOUNTER — Other Ambulatory Visit: Payer: Self-pay | Admitting: *Deleted

## 2015-11-27 DIAGNOSIS — T451X5A Adverse effect of antineoplastic and immunosuppressive drugs, initial encounter: Secondary | ICD-10-CM

## 2015-11-27 DIAGNOSIS — C189 Malignant neoplasm of colon, unspecified: Secondary | ICD-10-CM

## 2015-11-27 DIAGNOSIS — Z95828 Presence of other vascular implants and grafts: Secondary | ICD-10-CM

## 2015-11-27 DIAGNOSIS — C799 Secondary malignant neoplasm of unspecified site: Principal | ICD-10-CM

## 2015-11-27 DIAGNOSIS — R112 Nausea with vomiting, unspecified: Secondary | ICD-10-CM

## 2015-11-27 MED ORDER — ONDANSETRON HCL 8 MG PO TABS: 8.0000 mg | ORAL_TABLET | Freq: Three times a day (TID) | ORAL | Status: DC | PRN

## 2015-11-28 ENCOUNTER — Inpatient Hospital Stay: Payer: Medicare HMO

## 2015-11-28 VITALS — BP 128/78 | HR 88 | Temp 96.1°F | Resp 20

## 2015-11-28 DIAGNOSIS — C189 Malignant neoplasm of colon, unspecified: Secondary | ICD-10-CM

## 2015-11-28 DIAGNOSIS — C799 Secondary malignant neoplasm of unspecified site: Secondary | ICD-10-CM

## 2015-11-28 DIAGNOSIS — Z5111 Encounter for antineoplastic chemotherapy: Secondary | ICD-10-CM | POA: Diagnosis not present

## 2015-11-28 DIAGNOSIS — C182 Malignant neoplasm of ascending colon: Secondary | ICD-10-CM

## 2015-11-28 DIAGNOSIS — C7A8 Other malignant neuroendocrine tumors: Secondary | ICD-10-CM

## 2015-11-28 MED ORDER — SODIUM CHLORIDE 0.9% FLUSH
10.0000 mL | INTRAVENOUS | Status: DC | PRN
  Administered 2015-11-28: 10 mL
  Filled 2015-11-28: qty 10

## 2015-11-28 MED ORDER — HEPARIN SOD (PORK) LOCK FLUSH 100 UNIT/ML IV SOLN
500.0000 [IU] | Freq: Once | INTRAVENOUS | Status: AC | PRN
  Administered 2015-11-28: 500 [IU]
  Filled 2015-11-28: qty 5

## 2015-11-28 MED ORDER — PEGFILGRASTIM 6 MG/0.6ML ~~LOC~~ PSKT
6.0000 mg | PREFILLED_SYRINGE | Freq: Once | SUBCUTANEOUS | Status: AC
  Administered 2015-11-28: 6 mg via SUBCUTANEOUS
  Filled 2015-11-28: qty 0.6

## 2015-12-09 ENCOUNTER — Encounter: Payer: Self-pay | Admitting: Internal Medicine

## 2015-12-09 ENCOUNTER — Other Ambulatory Visit: Payer: Self-pay | Admitting: Internal Medicine

## 2015-12-09 DIAGNOSIS — Z5111 Encounter for antineoplastic chemotherapy: Secondary | ICD-10-CM | POA: Insufficient documentation

## 2015-12-09 DIAGNOSIS — C182 Malignant neoplasm of ascending colon: Secondary | ICD-10-CM

## 2015-12-10 ENCOUNTER — Inpatient Hospital Stay (HOSPITAL_BASED_OUTPATIENT_CLINIC_OR_DEPARTMENT_OTHER): Payer: Medicare HMO | Admitting: Internal Medicine

## 2015-12-10 ENCOUNTER — Inpatient Hospital Stay: Payer: Medicare HMO

## 2015-12-10 VITALS — BP 142/92 | HR 90 | Temp 96.3°F | Resp 20 | Wt 204.4 lb

## 2015-12-10 DIAGNOSIS — Z79899 Other long term (current) drug therapy: Secondary | ICD-10-CM

## 2015-12-10 DIAGNOSIS — C7A8 Other malignant neuroendocrine tumors: Secondary | ICD-10-CM

## 2015-12-10 DIAGNOSIS — C7B02 Secondary carcinoid tumors of liver: Secondary | ICD-10-CM

## 2015-12-10 DIAGNOSIS — M1990 Unspecified osteoarthritis, unspecified site: Secondary | ICD-10-CM

## 2015-12-10 DIAGNOSIS — C182 Malignant neoplasm of ascending colon: Secondary | ICD-10-CM

## 2015-12-10 DIAGNOSIS — I10 Essential (primary) hypertension: Secondary | ICD-10-CM

## 2015-12-10 DIAGNOSIS — Z5111 Encounter for antineoplastic chemotherapy: Secondary | ICD-10-CM | POA: Diagnosis not present

## 2015-12-10 DIAGNOSIS — C7A022 Malignant carcinoid tumor of the ascending colon: Secondary | ICD-10-CM | POA: Diagnosis not present

## 2015-12-10 DIAGNOSIS — C189 Malignant neoplasm of colon, unspecified: Secondary | ICD-10-CM

## 2015-12-10 DIAGNOSIS — C799 Secondary malignant neoplasm of unspecified site: Principal | ICD-10-CM

## 2015-12-10 LAB — COMPREHENSIVE METABOLIC PANEL
ALBUMIN: 3.6 g/dL (ref 3.5–5.0)
ALK PHOS: 103 U/L (ref 38–126)
ALT: 28 U/L (ref 14–54)
ANION GAP: 5 (ref 5–15)
AST: 24 U/L (ref 15–41)
BILIRUBIN TOTAL: 0.3 mg/dL (ref 0.3–1.2)
BUN: 17 mg/dL (ref 6–20)
CALCIUM: 8.7 mg/dL — AB (ref 8.9–10.3)
CO2: 25 mmol/L (ref 22–32)
CREATININE: 0.89 mg/dL (ref 0.44–1.00)
Chloride: 105 mmol/L (ref 101–111)
GFR calc non Af Amer: >60 mL/min (ref >60)
GLUCOSE: 100 mg/dL — AB (ref 65–99)
Potassium: 3.6 mmol/L (ref 3.5–5.1)
Sodium: 135 mmol/L (ref 135–145)
TOTAL PROTEIN: 7.8 g/dL (ref 6.5–8.1)

## 2015-12-10 LAB — CBC WITH DIFFERENTIAL/PLATELET
Basophils Absolute: 0.1 10*3/uL (ref 0–0.1)
Basophils Relative: 1 %
Eosinophils Absolute: 0.3 10*3/uL (ref 0–0.7)
Eosinophils Relative: 3 %
HEMATOCRIT: 29.2 % — AB (ref 35.0–47.0)
HEMOGLOBIN: 10.2 g/dL — AB (ref 12.0–16.0)
LYMPHS ABS: 1.2 10*3/uL (ref 1.0–3.6)
Lymphocytes Relative: 13 %
MCH: 33.2 pg (ref 26.0–34.0)
MCHC: 34.8 g/dL (ref 32.0–36.0)
MCV: 95.3 fL (ref 80.0–100.0)
MONOS PCT: 4 %
Monocytes Absolute: 0.4 10*3/uL (ref 0.2–0.9)
NEUTROS ABS: 7.3 10*3/uL — AB (ref 1.4–6.5)
NEUTROS PCT: 79 %
Platelets: 285 10*3/uL (ref 150–440)
RBC: 3.06 MIL/uL — AB (ref 3.80–5.20)
RDW: 18.2 % — ABNORMAL HIGH (ref 11.5–14.5)
WBC: 9.2 10*3/uL (ref 3.6–11.0)

## 2015-12-10 MED ORDER — ATROPINE SULFATE 1 MG/ML IJ SOLN
0.5000 mg | Freq: Once | INTRAMUSCULAR | Status: AC | PRN
  Administered 2015-12-10: 0.5 mg via INTRAVENOUS
  Filled 2015-12-10: qty 1

## 2015-12-10 MED ORDER — IRINOTECAN HCL CHEMO INJECTION 100 MG/5ML
260.0000 mg | Freq: Once | INTRAVENOUS | Status: AC
  Administered 2015-12-10: 260 mg via INTRAVENOUS
  Filled 2015-12-10: qty 13

## 2015-12-10 MED ORDER — PALONOSETRON HCL INJECTION 0.25 MG/5ML
0.2500 mg | Freq: Once | INTRAVENOUS | Status: AC
  Administered 2015-12-10: 0.25 mg via INTRAVENOUS
  Filled 2015-12-10: qty 5

## 2015-12-10 MED ORDER — FLUOROURACIL CHEMO INJECTION 5 GM/100ML
2400.0000 mg/m2 | INTRAVENOUS | Status: DC
  Administered 2015-12-10: 4900 mg via INTRAVENOUS
  Filled 2015-12-10: qty 98

## 2015-12-10 MED ORDER — LEUCOVORIN CALCIUM INJECTION 350 MG
800.0000 mg | Freq: Once | INTRAVENOUS | Status: AC
  Administered 2015-12-10: 800 mg via INTRAVENOUS
  Filled 2015-12-10: qty 40

## 2015-12-10 MED ORDER — SODIUM CHLORIDE 0.9 % IV SOLN
10.0000 mg | Freq: Once | INTRAVENOUS | Status: AC
  Administered 2015-12-10: 10 mg via INTRAVENOUS
  Filled 2015-12-10: qty 1

## 2015-12-10 MED ORDER — SODIUM CHLORIDE 0.9% FLUSH
10.0000 mL | INTRAVENOUS | Status: DC | PRN
  Administered 2015-12-10: 10 mL
  Filled 2015-12-10: qty 10

## 2015-12-10 MED ORDER — HEPARIN SOD (PORK) LOCK FLUSH 100 UNIT/ML IV SOLN: 500.0000 [IU] | Freq: Once | INTRAVENOUS | Status: DC | PRN

## 2015-12-10 MED ORDER — DEXTROSE 5 % IV SOLN
Freq: Once | INTRAVENOUS | Status: AC
  Administered 2015-12-10: 10:00:00 via INTRAVENOUS
  Filled 2015-12-10: qty 1000

## 2015-12-10 MED ORDER — OXALIPLATIN CHEMO INJECTION 100 MG/20ML
85.0000 mg/m2 | Freq: Once | INTRAVENOUS | Status: AC
  Administered 2015-12-10: 175 mg via INTRAVENOUS
  Filled 2015-12-10: qty 35

## 2015-12-10 NOTE — Assessment & Plan Note (Signed)
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 Clinically no evidence of progression noted. Patient tolerated chemotherapy extremely well.  # Reviewed the labs today unremarkable. Proceed with cycle #2. Awaiting CEA today. Discussed with the family that we'll plan to get a CT scan after 4 cycles of chemotherapy.  # Follow-up with me 2 weeks to proceed with cycle number 3.

## 2015-12-10 NOTE — Progress Notes (Signed)
[place] NOTE  [name], [name], [name], [name], [name]; [name]; [name]; [name]; [name]; [name]; [name]; [name]  # right kidney lesion 2.6 x 1.6 cm ? 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 FOLFIRINOX is here for follow-up patient is status post cycle #1 approximately 2 weeks ago.  Patient had mild nausea no vomiting. She took anti-emetics. Denies any significant diarrhea. Denies any skin rash. No fevers or chills. No sores in the mouth.  Her appetite is good. No chest pain or shortness of the cough.  ROS: A complete 10 point review of system is done which is negative except mentioned above in history of present illness  MEDICAL HISTORY:  Past Medical History  Diagnosis Date  . Hypertension   . Obesity (BMI 30-39.9)   . Angiolipoma of kidney     followed with serial CTs ,,  Dr. Jacqlyn Larsen  . Hammer toe   . Arthritis   . Cancer [facility])     colon    SURGICAL HISTORY: Past Surgical History  Procedure Laterality Date  . Abdominal hysterectomy  1995    endometriosis, heavy bleeding  . Oophorectomy    . Rotator cuff repair Right  2008    right shoulder.  [place]   . Cesarean section  1985  . Bunionectomy Bilateral   . Portacath placement Left 10/01/2015    Procedure: INSERTION PORT-A-CATH;  Surgeon: Robert Bellow, MD;  Location: [facility] ORS;  Service: General;  Laterality: Left;  . Colonoscopy with propofol N/A 10/09/2015    Procedure: COLONOSCOPY WITH PROPOFOL;  Surgeon: Robert Bellow, MD;  Location: [facility] ENDOSCOPY;  Service: Endoscopy;  Laterality: N/A;    SOCIAL HISTORY: She lives at home with her family in [place]. 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 Social History   Social History  . Marital Status: Married    Spouse Name: N/A  . Number of Children: N/A  . Years of Education: N/A   Occupational History  . Not on file.   Social History Main Topics  . Smoking status: Never Smoker   . Smokeless tobacco: Never Used  . Alcohol Use: 0.0 oz/week    0 Standard drinks or equivalent per week     Comment: occasionally  . Drug Use: No  . Sexual Activity: Not Currently   Other Topics Concern  . Not on file   Social History Narrative    FAMILY HISTORY: Family History  Problem Relation Age of Onset  . Mental illness Mother 7    bipolar, dementia  . Cancer Maternal Aunt 70    breast ca  . Hypertension Father   . Stroke Father 74  deceased  . COPD Father   . Heart disease Sister     deceased    ALLERGIES:  is allergic to bee venom and morphine and related.  MEDICATIONS:  Current Outpatient Prescriptions  Medication Sig Dispense Refill  . ALPRAZolam (XANAX) 0.25 MG tablet Take 1 tablet (0.25 mg total) by mouth daily as needed for sleep or anxiety. 90 tablet 5  . amLODipine (NORVASC) 5 MG tablet Take 1 tablet (5 mg total) by mouth daily. 30 tablet 3  . diphenoxylate-atropine (LOMOTIL) 2.5-0.025 MG tablet Take 1 tablet by mouth 4 (four) times daily as needed for diarrhea or loose stools. 30 tablet 6  . lidocaine-prilocaine (EMLA) cream Apply 1  application topically as needed. Apply to port a cath site 1 hour prior to chemotherapy treatments 30 g 6  . olmesartan (BENICAR) 40 MG tablet Take 1 tablet (40 mg total) by mouth daily. 30 tablet 2  . ondansetron (ZOFRAN) 8 MG tablet Take 1 tablet (8 mg total) by mouth every 8 (eight) hours as needed for nausea or vomiting. 20 tablet 1  . prochlorperazine (COMPAZINE) 10 MG tablet Take 1 tablet (10 mg total) by mouth every 6 (six) hours as needed for nausea or vomiting. 30 tablet 6   No current facility-administered medications for this visit.   Facility-Administered Medications Ordered in Other Visits  Medication Dose Route Frequency Provider Last Rate Last Dose  . sodium chloride flush (NS) 0.9 % injection 10 mL  10 mL Intravenous PRN [name], MD   10 mL at 11/24/15 0836      .  PHYSICAL EXAMINATION: ECOG PERFORMANCE STATUS: 0 - Asymptomatic  Filed Vitals:   12/10/15 0914  BP: 142/92  Pulse: 90  Temp: 96.3 F (35.7 C)   Filed Weights   12/10/15 0914  Weight: 204 lb 5.9 oz (92.7 kg)    GENERAL: Well-nourished well-developed; Alert, no distress and comfortable.  Accompanied by her daughter.  EYES: no pallor or icterus OROPHARYNX: no thrush or ulceration; good dentition  NECK: supple, no masses felt LYMPH:  no palpable lymphadenopathy in the cervical, axillary or inguinal regions LUNGS: clear to auscultation and  No wheeze or crackles HEART/CVS: regular rate & rhythm and no murmurs; No lower extremity edema ABDOMEN: abdomen soft, non-tender and normal bowel sounds; positive for hepatomegaly. Musculoskeletal:no cyanosis of digits and no clubbing  PSYCH: alert & oriented x 3 with fluent speech NEURO: no focal motor/sensory deficits SKIN:  no rashes or significant lesions; Mediport is in place. Positive for alopecia.  LABORATORY DATA:  I have reviewed the data as listed Lab Results  Component Value Date   WBC 9.2 12/10/2015   HGB 10.2* 12/10/2015   HCT 29.2*  12/10/2015   MCV 95.3 12/10/2015   PLT 285 12/10/2015    Recent Labs  11/12/15 1116 11/24/15 0836 12/10/15 0851  NA 134* 135 135  K 4.1 4.3 3.6  CL 103 107 105  CO2 26 23 25   GLUCOSE 90 78 100*  BUN 23* 19 17  CREATININE 1.03* 1.00 0.89  CALCIUM 9.0 8.7* 8.7*  GFRNONAA 56* 58* >60  GFRAA >60 >60 >60  PROT 8.3* 8.0 7.8  ALBUMIN 4.2 3.7 3.6  AST 30 41 24  ALT 46 57* 28  ALKPHOS 106 79 103  BILITOT 0.4 0.3 0.3    RADIOGRAPHIC STUDIES: I have personally reviewed the radiological images as listed and agreed with the findings in the report. Ct Abdomen Pelvis W Contrast  11/21/2015  CLINICAL DATA:  Colorectal carcinoma with liver metastasis. Evaluate response to chemotherapy. EXAM: CT ABDOMEN AND PELVIS WITH CONTRAST TECHNIQUE: Multidetector CT imaging of the abdomen and pelvis was performed using the standard protocol following bolus administration of intravenous contrast. CONTRAST:  115mL ISOVUE-300 IOPAMIDOL (ISOVUE-300) INJECTION 61% COMPARISON:  PET-CT 10/06/2015, CT 09/17/2015 FINDINGS: Lower chest: Lung bases are clear. Hepatobiliary: For comparison of size, the hepatic lesions are best compared to the contrast CT of 09/17/2015. The dominant lesion in the RIGHT hepatic lobe measures 9.9 x 6.1 cm compared 8.3 x 5.4 cm. Adjacent RIGHT hepatic lobe lesion measures 3.6 x 3.2 cm compared to 2.9 x 0 2.5 cm remeasured. Lesion in the central LEFT hepatic lobe measures 2.7 by 3.0 cm increased from 1.7 x 1.6 cm. Subcapsular lesion in the LEFT hepatic lobe measures 1.6 cm compared to 1.3 cm. Potential new lesion measures 6 mm on image 27, series 2 with the RIGHT hepatic lobe Pancreas: Pancreas is normal. No ductal dilatation. No pancreatic inflammation. Spleen: Normal spleen Adrenals/urinary tract: Adrenal glands are normal. Exophytic lesion from the mid RIGHT renal cortex has fat attenuation on comparison noncontrast CT, measures 21 mm unchanged, and is most consists with benign angiomyolipoma.  Ureters and bladder normal. Stomach/Bowel: Stomach and small bowel are normal. Circumferential lesion in the cecum at the level of the ileocecal bowel is difficult to measure but does not appear appreciably changed (image 50, series 2). There is bulky adenopathy in the ileocecal mesentery. For example 3.0 cm lymph node (image 45, series 2) compares to 3.2 cm. 1.7 cm short axis node compares a 1.5 cm. There is no evidence of bowel obstruction. Distal colon rectum appear normal appear Vascular/Lymphatic: Abdominal aorta normal caliber. Bulky ileo cecal mesenteric adenopathy unchanged from prior described in the GI section. No new adenopathy evident. Reproductive: Post hysterectomy Other: No peritoneal metastasis. Musculoskeletal: No aggressive osseous lesion. IMPRESSION: 1. Interval increase in size of hepatic metastasis. 2. Stable mass in the ascending colon. No evidence bowel obstruction 3. Stable bulky adenopathy in the ileocecal mesentery. Electronically Signed   By: Suzy Bouchard M.D.   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 Clinically no evidence of progression noted. Patient tolerated chemotherapy extremely well.  # Reviewed the labs today unremarkable. Proceed with cycle #2. Awaiting CEA today. Discussed with the family that we'll plan to get a CT scan after 4 cycles of chemotherapy.  # Follow-up with me 2 weeks to proceed with cycle number 3.       [name], MD 12/10/2015 10:02 AM

## 2015-12-11 LAB — CEA: CEA: 1167 ng/mL — AB (ref 0.0–4.7)

## 2015-12-12 ENCOUNTER — Inpatient Hospital Stay: Payer: Medicare HMO

## 2015-12-12 VITALS — BP 107/75 | HR 87 | Temp 96.4°F

## 2015-12-12 DIAGNOSIS — C799 Secondary malignant neoplasm of unspecified site: Secondary | ICD-10-CM

## 2015-12-12 DIAGNOSIS — C7A8 Other malignant neuroendocrine tumors: Secondary | ICD-10-CM

## 2015-12-12 DIAGNOSIS — Z5111 Encounter for antineoplastic chemotherapy: Secondary | ICD-10-CM | POA: Diagnosis not present

## 2015-12-12 DIAGNOSIS — C182 Malignant neoplasm of ascending colon: Secondary | ICD-10-CM

## 2015-12-12 DIAGNOSIS — C189 Malignant neoplasm of colon, unspecified: Secondary | ICD-10-CM

## 2015-12-12 MED ORDER — SODIUM CHLORIDE 0.9% FLUSH
10.0000 mL | INTRAVENOUS | Status: DC | PRN
  Administered 2015-12-12: 10 mL
  Filled 2015-12-12: qty 10

## 2015-12-12 MED ORDER — HEPARIN SOD (PORK) LOCK FLUSH 100 UNIT/ML IV SOLN
500.0000 [IU] | Freq: Once | INTRAVENOUS | Status: AC | PRN
  Administered 2015-12-12: 500 [IU]
  Filled 2015-12-12: qty 5

## 2015-12-12 MED ORDER — PEGFILGRASTIM 6 MG/0.6ML ~~LOC~~ PSKT
6.0000 mg | PREFILLED_SYRINGE | Freq: Once | SUBCUTANEOUS | Status: AC
  Administered 2015-12-12: 6 mg via SUBCUTANEOUS
  Filled 2015-12-12: qty 0.6

## 2015-12-18 ENCOUNTER — Telehealth: Payer: Self-pay

## 2015-12-18 NOTE — Progress Notes (Deleted)
  Oncology Nurse Navigator Documentation      )                                                           

## 2015-12-18 NOTE — Telephone Encounter (Signed)
Opened in error

## 2015-12-18 NOTE — Telephone Encounter (Signed)
Dr. Rogue Bussing spoke with Steffanie Dunn. Steffanie Dunn will contact patient with recommendation for zofran.

## 2015-12-18 NOTE — Telephone Encounter (Signed)
  Oncology Nurse Navigator Documentation  Navigator Location: [facility] (12/18/15 1400) Navigator Encounter Type: Telephone (12/18/15 1400) Telephone: Outgoing Call;Symptom Mgt (12/18/15 1400)                                        Time Spent with Patient: 15 (12/18/15 1400)   Spoke with patient. Per Dr Rogue Bussing she can increase her zofran to every 6 hours. Readback performed.

## 2015-12-18 NOTE — Telephone Encounter (Signed)
  Oncology Nurse Navigator Documentation  Navigator Location: [facility] (12/18/15 1000) Navigator Encounter Type: Telephone (12/18/15 1000) Telephone: Incoming Call;Symptom Mgt (12/18/15 1000)                                        Time Spent with Patient: 15 (12/18/15 1000)   Received call from Ms Kathleen James. She is continuing to have difficult time with nausea and diarrhea. Reports she is taking her imodium, lomotil, zofran, and compazine. Asking if she can increase how often she takes her zofran. She is trying to work and compazine makes her more tired. Please advise and I can notify patient.

## 2015-12-18 NOTE — Progress Notes (Signed)
Opened chart in error.

## 2015-12-19 ENCOUNTER — Encounter: Payer: Self-pay | Admitting: Internal Medicine

## 2015-12-19 ENCOUNTER — Other Ambulatory Visit: Payer: Self-pay | Admitting: *Deleted

## 2015-12-19 DIAGNOSIS — C189 Malignant neoplasm of colon, unspecified: Secondary | ICD-10-CM

## 2015-12-19 DIAGNOSIS — Z95828 Presence of other vascular implants and grafts: Secondary | ICD-10-CM

## 2015-12-19 DIAGNOSIS — R112 Nausea with vomiting, unspecified: Secondary | ICD-10-CM

## 2015-12-19 DIAGNOSIS — C799 Secondary malignant neoplasm of unspecified site: Principal | ICD-10-CM

## 2015-12-19 DIAGNOSIS — T451X5A Adverse effect of antineoplastic and immunosuppressive drugs, initial encounter: Secondary | ICD-10-CM

## 2015-12-19 MED ORDER — ONDANSETRON HCL 8 MG PO TABS: 8.0000 mg | ORAL_TABLET | Freq: Four times a day (QID) | ORAL | Status: DC | PRN

## 2015-12-24 ENCOUNTER — Inpatient Hospital Stay: Payer: Medicare HMO

## 2015-12-24 ENCOUNTER — Inpatient Hospital Stay: Payer: Medicare HMO | Attending: Internal Medicine

## 2015-12-24 ENCOUNTER — Inpatient Hospital Stay (HOSPITAL_BASED_OUTPATIENT_CLINIC_OR_DEPARTMENT_OTHER): Payer: Medicare HMO | Admitting: Internal Medicine

## 2015-12-24 VITALS — BP 119/84 | HR 87 | Temp 96.8°F | Resp 18 | Wt 196.0 lb

## 2015-12-24 DIAGNOSIS — C7B02 Secondary carcinoid tumors of liver: Secondary | ICD-10-CM | POA: Insufficient documentation

## 2015-12-24 DIAGNOSIS — C799 Secondary malignant neoplasm of unspecified site: Secondary | ICD-10-CM | POA: Diagnosis not present

## 2015-12-24 DIAGNOSIS — Z803 Family history of malignant neoplasm of breast: Secondary | ICD-10-CM | POA: Insufficient documentation

## 2015-12-24 DIAGNOSIS — R112 Nausea with vomiting, unspecified: Secondary | ICD-10-CM | POA: Insufficient documentation

## 2015-12-24 DIAGNOSIS — C189 Malignant neoplasm of colon, unspecified: Secondary | ICD-10-CM

## 2015-12-24 DIAGNOSIS — C7A022 Malignant carcinoid tumor of the ascending colon: Secondary | ICD-10-CM

## 2015-12-24 DIAGNOSIS — Z7689 Persons encountering health services in other specified circumstances: Secondary | ICD-10-CM | POA: Diagnosis not present

## 2015-12-24 DIAGNOSIS — E876 Hypokalemia: Secondary | ICD-10-CM

## 2015-12-24 DIAGNOSIS — Z5111 Encounter for antineoplastic chemotherapy: Secondary | ICD-10-CM | POA: Diagnosis not present

## 2015-12-24 DIAGNOSIS — M1990 Unspecified osteoarthritis, unspecified site: Secondary | ICD-10-CM | POA: Diagnosis not present

## 2015-12-24 DIAGNOSIS — N289 Disorder of kidney and ureter, unspecified: Secondary | ICD-10-CM | POA: Diagnosis not present

## 2015-12-24 DIAGNOSIS — R197 Diarrhea, unspecified: Secondary | ICD-10-CM | POA: Diagnosis not present

## 2015-12-24 DIAGNOSIS — C7A8 Other malignant neuroendocrine tumors: Secondary | ICD-10-CM

## 2015-12-24 DIAGNOSIS — Z79899 Other long term (current) drug therapy: Secondary | ICD-10-CM | POA: Insufficient documentation

## 2015-12-24 DIAGNOSIS — I10 Essential (primary) hypertension: Secondary | ICD-10-CM | POA: Insufficient documentation

## 2015-12-24 DIAGNOSIS — C182 Malignant neoplasm of ascending colon: Secondary | ICD-10-CM

## 2015-12-24 DIAGNOSIS — R5383 Other fatigue: Secondary | ICD-10-CM | POA: Diagnosis not present

## 2015-12-24 DIAGNOSIS — T451X5S Adverse effect of antineoplastic and immunosuppressive drugs, sequela: Secondary | ICD-10-CM | POA: Insufficient documentation

## 2015-12-24 LAB — CBC WITH DIFFERENTIAL/PLATELET
BASOS ABS: 0 10*3/uL (ref 0–0.1)
BASOS PCT: 1 %
EOS ABS: 0.2 10*3/uL (ref 0–0.7)
EOS PCT: 3 %
HCT: 27.7 % — ABNORMAL LOW (ref 35.0–47.0)
Hemoglobin: 9.8 g/dL — ABNORMAL LOW (ref 12.0–16.0)
Lymphocytes Relative: 9 %
Lymphs Abs: 0.7 10*3/uL — ABNORMAL LOW (ref 1.0–3.6)
MCH: 33.5 pg (ref 26.0–34.0)
MCHC: 35.3 g/dL (ref 32.0–36.0)
MCV: 94.9 fL (ref 80.0–100.0)
MONO ABS: 0.4 10*3/uL (ref 0.2–0.9)
MONOS PCT: 5 %
NEUTROS ABS: 6.4 10*3/uL (ref 1.4–6.5)
Neutrophils Relative %: 82 %
PLATELETS: 276 10*3/uL (ref 150–440)
RBC: 2.92 MIL/uL — ABNORMAL LOW (ref 3.80–5.20)
RDW: 18.3 % — AB (ref 11.5–14.5)
WBC: 7.7 10*3/uL (ref 3.6–11.0)

## 2015-12-24 LAB — COMPREHENSIVE METABOLIC PANEL
ALBUMIN: 3.3 g/dL — AB (ref 3.5–5.0)
ALK PHOS: 81 U/L (ref 38–126)
ALT: 25 U/L (ref 14–54)
ANION GAP: 4 — AB (ref 5–15)
AST: 24 U/L (ref 15–41)
BILIRUBIN TOTAL: 0.2 mg/dL — AB (ref 0.3–1.2)
BUN: 10 mg/dL (ref 6–20)
CALCIUM: 8.7 mg/dL — AB (ref 8.9–10.3)
CO2: 24 mmol/L (ref 22–32)
CREATININE: 0.99 mg/dL (ref 0.44–1.00)
Chloride: 107 mmol/L (ref 101–111)
GFR calc non Af Amer: 59 mL/min — ABNORMAL LOW (ref >60)
GLUCOSE: 111 mg/dL — AB (ref 65–99)
Potassium: 3.3 mmol/L — ABNORMAL LOW (ref 3.5–5.1)
SODIUM: 135 mmol/L (ref 135–145)
TOTAL PROTEIN: 7.3 g/dL (ref 6.5–8.1)

## 2015-12-24 MED ORDER — PALONOSETRON HCL INJECTION 0.25 MG/5ML
0.2500 mg | Freq: Once | INTRAVENOUS | Status: AC
  Administered 2015-12-24: 0.25 mg via INTRAVENOUS
  Filled 2015-12-24: qty 5

## 2015-12-24 MED ORDER — POTASSIUM CHLORIDE CRYS ER 20 MEQ PO TBCR: 40.0000 meq | EXTENDED_RELEASE_TABLET | Freq: Two times a day (BID) | ORAL | Status: DC

## 2015-12-24 MED ORDER — DEXTROSE 5 % IV SOLN
Freq: Once | INTRAVENOUS | Status: AC
  Administered 2015-12-24: 10:00:00 via INTRAVENOUS
  Filled 2015-12-24: qty 1000

## 2015-12-24 MED ORDER — SODIUM CHLORIDE 0.9 % IV SOLN
20.0000 mg | Freq: Once | INTRAVENOUS | Status: AC
  Administered 2015-12-24: 20 mg via INTRAVENOUS
  Filled 2015-12-24: qty 2

## 2015-12-24 MED ORDER — IRINOTECAN HCL CHEMO INJECTION 100 MG/5ML
260.0000 mg | Freq: Once | INTRAVENOUS | Status: AC
  Administered 2015-12-24: 260 mg via INTRAVENOUS
  Filled 2015-12-24: qty 10

## 2015-12-24 MED ORDER — SODIUM CHLORIDE 0.9% FLUSH
10.0000 mL | Freq: Once | INTRAVENOUS | Status: AC
  Administered 2015-12-24: 10 mL via INTRAVENOUS
  Filled 2015-12-24: qty 10

## 2015-12-24 MED ORDER — HEPARIN SOD (PORK) LOCK FLUSH 100 UNIT/ML IV SOLN: 500.0000 [IU] | Freq: Once | INTRAVENOUS | Status: AC

## 2015-12-24 MED ORDER — SODIUM CHLORIDE 0.9 % IV SOLN
2400.0000 mg/m2 | INTRAVENOUS | Status: DC
  Administered 2015-12-24: 4900 mg via INTRAVENOUS
  Filled 2015-12-24: qty 98

## 2015-12-24 MED ORDER — OXALIPLATIN CHEMO INJECTION 100 MG/20ML
85.0000 mg/m2 | Freq: Once | INTRAVENOUS | Status: AC
  Administered 2015-12-24: 175 mg via INTRAVENOUS
  Filled 2015-12-24: qty 35

## 2015-12-24 MED ORDER — ATROPINE SULFATE 1 MG/ML IJ SOLN
0.5000 mg | Freq: Once | INTRAMUSCULAR | Status: AC | PRN
  Administered 2015-12-24: 0.5 mg via INTRAVENOUS
  Filled 2015-12-24: qty 1

## 2015-12-24 MED ORDER — DEXTROSE 5 % IV SOLN
800.0000 mg | Freq: Once | INTRAVENOUS | Status: AC
  Administered 2015-12-24: 800 mg via INTRAVENOUS
  Filled 2015-12-24: qty 40

## 2015-12-24 NOTE — Assessment & Plan Note (Addendum)
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 Clinically no evidence of progression noted. Patient tolerated chemotherapy with mild to moderate side effects discussed below; but CEA- going up. I discussed my concerns in detail.   # Reviewed the labs today unremarkable/mild hypokalemia. Proceed with cycle #3. Awaiting CEA today. Discussed with the family that we'll plan to get a CT scan after 3 cycles of chemotherapy.   # diarrhea- continue Imodium and Lomotil; plan IV fluids July 7th; and also next week if needed.  # Nausea with vomiting recommend increasing the dose of dexamethasone today. And also continue Zofran/Compazine  # hypokalemia-recommend potassium supplementation.  # CT abdomen pelvis with contrast in 10 days from now; follow-up with me in 2 weeks for cycle #4.

## 2015-12-24 NOTE — Progress Notes (Signed)
[place] NOTE  [name], [name], [name], [name], [name]; [name]; [name]; [name]; [name]; [name]; [name]; [name]  # right kidney lesion 2.6 x 1.6 cm ? 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 FOLFIRINOX is here for follow-up patient is status post cycle #2  approximately 2 weeks ago.  Patient had more nausea with episodes of vomiting. She had to take Zofran Compazine around-the-clock. She also had diarrhea and to take Imodium and Lomotil. Denies any skin rash. No fevers or chills. No sores in the mouth.  Her appetite is good. No chest pain or shortness of the cough.  ROS: A complete 10 point review of system is done which is negative except mentioned above in history of present illness  MEDICAL HISTORY:  Past Medical History  Diagnosis Date  . Hypertension   . Obesity (BMI 30-39.9)   . Angiolipoma of kidney     followed with serial CTs ,,  Dr. Jacqlyn Larsen  . Hammer toe   . Arthritis   . Cancer [facility])     colon    SURGICAL HISTORY: Past Surgical History  Procedure Laterality Date  . Abdominal hysterectomy  1995    endometriosis,  heavy bleeding  . Oophorectomy    . Rotator cuff repair Right 2008    right shoulder.  [place]   . Cesarean section  1985  . Bunionectomy Bilateral   . Portacath placement Left 10/01/2015    Procedure: INSERTION PORT-A-CATH;  Surgeon: Robert Bellow, MD;  Location: [facility] ORS;  Service: General;  Laterality: Left;  . Colonoscopy with propofol N/A 10/09/2015    Procedure: COLONOSCOPY WITH PROPOFOL;  Surgeon: Robert Bellow, MD;  Location: [facility] ENDOSCOPY;  Service: Endoscopy;  Laterality: N/A;    SOCIAL HISTORY: She lives at home with her family in [place]. 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 Social History   Social History  . Marital Status: Married    Spouse Name: N/A  . Number of Children: N/A  . Years of Education: N/A   Occupational History  . Not on file.   Social History Main Topics  . Smoking status: Never Smoker   . Smokeless tobacco: Never Used  . Alcohol Use: 0.0 oz/week    0 Standard drinks or equivalent per week     Comment: occasionally  . Drug Use: No  . Sexual Activity: Not Currently   Other Topics Concern  . Not on file   Social History Narrative    FAMILY HISTORY: Family History  Problem Relation Age of Onset  . Mental illness Mother 78    bipolar, dementia  . Cancer Maternal Aunt 73  breast ca  . Hypertension Father   . Stroke Father 28    deceased  . COPD Father   . Heart disease Sister     deceased    ALLERGIES:  is allergic to bee venom and morphine and related.  MEDICATIONS:  Current Outpatient Prescriptions  Medication Sig Dispense Refill  . ALPRAZolam (XANAX) 0.25 MG tablet Take 1 tablet (0.25 mg total) by mouth daily as needed for sleep or anxiety. 90 tablet 5  . amLODipine (NORVASC) 5 MG tablet Take 1 tablet (5 mg total) by mouth daily. 30 tablet 3  . diphenoxylate-atropine (LOMOTIL) 2.5-0.025 MG tablet Take 1 tablet by mouth 4 (four) times daily as needed for diarrhea or loose  stools. 30 tablet 6  . lidocaine-prilocaine (EMLA) cream Apply 1 application topically as needed. Apply to port a cath site 1 hour prior to chemotherapy treatments 30 g 6  . olmesartan (BENICAR) 40 MG tablet Take 1 tablet (40 mg total) by mouth daily. 30 tablet 2  . ondansetron (ZOFRAN) 8 MG tablet Take 1 tablet (8 mg total) by mouth every 6 (six) hours as needed for nausea or vomiting. 30 tablet 3  . prochlorperazine (COMPAZINE) 10 MG tablet Take 1 tablet (10 mg total) by mouth every 6 (six) hours as needed for nausea or vomiting. 30 tablet 6  . potassium chloride SA (K-DUR,KLOR-CON) 20 MEQ tablet Take 2 tablets (40 mEq total) by mouth 2 (two) times daily. 30 tablet 3   No current facility-administered medications for this visit.   Facility-Administered Medications Ordered in Other Visits  Medication Dose Route Frequency Provider Last Rate Last Dose  . heparin lock flush 100 unit/mL  500 Units Intravenous Once [name], MD   500 Units at 12/24/15 1609  . sodium chloride flush (NS) 0.9 % injection 10 mL  10 mL Intravenous PRN [name], MD   10 mL at 11/24/15 0836      .  PHYSICAL EXAMINATION: ECOG PERFORMANCE STATUS: 0 - Asymptomatic  Filed Vitals:   12/24/15 0900  BP: 119/84  Pulse: 87  Temp: 96.8 F (36 C)  Resp: 18   Filed Weights   12/24/15 0900  Weight: 195 lb 15.8 oz (88.9 kg)    GENERAL: Well-nourished well-developed; Alert, no distress and comfortable.  Accompanied by her Husband. EYES: no pallor or icterus OROPHARYNX: no thrush or ulceration; good dentition  NECK: supple, no masses felt LYMPH:  no palpable lymphadenopathy in the cervical, axillary or inguinal regions LUNGS: clear to auscultation and  No wheeze or crackles HEART/CVS: regular rate & rhythm and no murmurs; No lower extremity edema ABDOMEN: abdomen soft, non-tender and normal bowel sounds; positive for hepatomegaly. Musculoskeletal:no cyanosis of digits and no clubbing  PSYCH:  alert & oriented x 3 with fluent speech NEURO: no focal motor/sensory deficits SKIN:  no rashes or significant lesions; Mediport is in place. Positive for alopecia.  LABORATORY DATA:  I have reviewed the data as listed Lab Results  Component Value Date   WBC 7.7 12/24/2015   HGB 9.8* 12/24/2015   HCT 27.7* 12/24/2015   MCV 94.9 12/24/2015   PLT 276 12/24/2015    Recent Labs  11/24/15 0836 12/10/15 0851 12/24/15 0850  NA 135 135 135  K 4.3 3.6 3.3*  CL 107 105 107  CO2 23 25 24   GLUCOSE 78 100* 111*  BUN 19 17 10   CREATININE 1.00 0.89 0.99  CALCIUM 8.7* 8.7* 8.7*  GFRNONAA 58* >60 59*  GFRAA >60 >  60 >60  PROT 8.0 7.8 7.3  ALBUMIN 3.7 3.6 3.3*  AST 41 24 24  ALT 57* 28 25  ALKPHOS 79 103 81  BILITOT 0.3 0.3 0.2*    RADIOGRAPHIC STUDIES: I have personally reviewed the radiological images as listed and agreed with the findings in the report. No results found.  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 Clinically no evidence of progression noted. Patient tolerated chemotherapy with mild to moderate side effects discussed below; but CEA- going up. I discussed my concerns in detail.   # Reviewed the labs today unremarkable/mild hypokalemia. Proceed with cycle #3. Awaiting CEA today. Discussed with the family that we'll plan to get a CT scan after 3 cycles of chemotherapy.   # diarrhea- continue Imodium and Lomotil; plan IV fluids July 7th; and also next week if needed.  # Nausea with vomiting recommend increasing the dose of dexamethasone today. And also continue Zofran/Compazine  # hypokalemia-recommend potassium supplementation.  # CT abdomen pelvis with contrast in 10 days from now; follow-up with me in 2 weeks for cycle #4.        [name], MD 12/24/2015 7:55 PM

## 2015-12-24 NOTE — Progress Notes (Signed)
Patient states after her treatment last Wednesday she had increased nausea.  For the next two days she had n/v.  The next day she developed diarrhea.  States this has been ongoing until yesterday.  States stools are still loose.  She has been drinking gatorade and eating chicken broth.

## 2015-12-25 LAB — CEA: CEA: 658.6 ng/mL — ABNORMAL HIGH (ref 0.0–4.7)

## 2015-12-26 ENCOUNTER — Inpatient Hospital Stay: Payer: Medicare HMO

## 2015-12-26 ENCOUNTER — Other Ambulatory Visit: Payer: Self-pay | Admitting: Internal Medicine

## 2015-12-26 VITALS — BP 110/79 | HR 80 | Temp 97.4°F

## 2015-12-26 DIAGNOSIS — Z5111 Encounter for antineoplastic chemotherapy: Secondary | ICD-10-CM | POA: Diagnosis not present

## 2015-12-26 DIAGNOSIS — C7A8 Other malignant neuroendocrine tumors: Secondary | ICD-10-CM

## 2015-12-26 DIAGNOSIS — C182 Malignant neoplasm of ascending colon: Secondary | ICD-10-CM

## 2015-12-26 DIAGNOSIS — C189 Malignant neoplasm of colon, unspecified: Secondary | ICD-10-CM

## 2015-12-26 DIAGNOSIS — E860 Dehydration: Secondary | ICD-10-CM | POA: Insufficient documentation

## 2015-12-26 DIAGNOSIS — C799 Secondary malignant neoplasm of unspecified site: Secondary | ICD-10-CM

## 2015-12-26 MED ORDER — HEPARIN SOD (PORK) LOCK FLUSH 100 UNIT/ML IV SOLN
500.0000 [IU] | Freq: Once | INTRAVENOUS | Status: AC | PRN
  Administered 2015-12-26: 500 [IU]
  Filled 2015-12-26: qty 5

## 2015-12-26 MED ORDER — SODIUM CHLORIDE 0.9% FLUSH
10.0000 mL | INTRAVENOUS | Status: DC | PRN
  Administered 2015-12-26: 10 mL
  Filled 2015-12-26: qty 10

## 2015-12-26 MED ORDER — PROMETHAZINE HCL 25 MG/ML IJ SOLN: 25.0000 mg | Freq: Once | INTRAMUSCULAR | Status: DC

## 2015-12-26 MED ORDER — PEGFILGRASTIM 6 MG/0.6ML ~~LOC~~ PSKT
6.0000 mg | PREFILLED_SYRINGE | Freq: Once | SUBCUTANEOUS | Status: AC
  Administered 2015-12-26: 6 mg via SUBCUTANEOUS
  Filled 2015-12-26: qty 0.6

## 2015-12-26 MED ORDER — ONDANSETRON HCL 40 MG/20ML IJ SOLN
Freq: Once | INTRAMUSCULAR | Status: AC
  Administered 2015-12-26: 14:00:00 via INTRAVENOUS
  Filled 2015-12-26: qty 4

## 2015-12-26 MED ORDER — SODIUM CHLORIDE 0.9 % IV SOLN
Freq: Once | INTRAVENOUS | Status: AC
  Administered 2015-12-26: 14:00:00 via INTRAVENOUS
  Filled 2015-12-26: qty 1000

## 2015-12-29 ENCOUNTER — Encounter: Payer: Self-pay | Admitting: *Deleted

## 2015-12-29 ENCOUNTER — Other Ambulatory Visit: Payer: Self-pay | Admitting: *Deleted

## 2015-12-29 ENCOUNTER — Telehealth: Payer: Self-pay | Admitting: *Deleted

## 2015-12-29 DIAGNOSIS — K521 Toxic gastroenteritis and colitis: Secondary | ICD-10-CM

## 2015-12-29 DIAGNOSIS — T451X5A Adverse effect of antineoplastic and immunosuppressive drugs, initial encounter: Principal | ICD-10-CM

## 2015-12-29 MED ORDER — DIPHENOXYLATE-ATROPINE 2.5-0.025 MG PO TABS: 1.0000 | ORAL_TABLET | Freq: Four times a day (QID) | ORAL | Status: DC | PRN

## 2015-12-29 NOTE — Progress Notes (Signed)
rx renewal for lomotil faxed to [facility]

## 2015-12-29 NOTE — Telephone Encounter (Signed)
-----   Message from Shawnee Knapp, RN sent at 12/29/2015  3:02 PM EDT ----- Regarding: [place] Received a message on triage line regarding an "urgent prior auth" for this patient.  The agent did not leave a name but can be reached at [phone] with reference [id number]

## 2015-12-29 NOTE — Telephone Encounter (Signed)
Rodena Piety, RN Contacted prior [employer] team.  Insurance returning phone call to prior [place] team on IV emend. Per anita, call was transferred to the prior auth team.

## 2015-12-30 ENCOUNTER — Inpatient Hospital Stay: Payer: Medicare HMO

## 2015-12-30 VITALS — BP 111/75 | HR 82 | Temp 97.5°F | Resp 16

## 2015-12-30 DIAGNOSIS — E876 Hypokalemia: Secondary | ICD-10-CM

## 2015-12-30 DIAGNOSIS — C7A8 Other malignant neuroendocrine tumors: Secondary | ICD-10-CM

## 2015-12-30 DIAGNOSIS — Z5111 Encounter for antineoplastic chemotherapy: Secondary | ICD-10-CM | POA: Diagnosis not present

## 2015-12-30 DIAGNOSIS — C182 Malignant neoplasm of ascending colon: Secondary | ICD-10-CM

## 2015-12-30 LAB — COMPREHENSIVE METABOLIC PANEL
ALT: 52 U/L (ref 14–54)
AST: 45 U/L — ABNORMAL HIGH (ref 15–41)
Albumin: 3.5 g/dL (ref 3.5–5.0)
Alkaline Phosphatase: 87 U/L (ref 38–126)
Anion gap: 4 — ABNORMAL LOW (ref 5–15)
BUN: 18 mg/dL (ref 6–20)
CHLORIDE: 102 mmol/L (ref 101–111)
CO2: 25 mmol/L (ref 22–32)
CREATININE: 0.87 mg/dL (ref 0.44–1.00)
Calcium: 8.4 mg/dL — ABNORMAL LOW (ref 8.9–10.3)
Glucose, Bld: 84 mg/dL (ref 65–99)
POTASSIUM: 3.8 mmol/L (ref 3.5–5.1)
SODIUM: 131 mmol/L — AB (ref 135–145)
Total Bilirubin: 0.7 mg/dL (ref 0.3–1.2)
Total Protein: 7.3 g/dL (ref 6.5–8.1)

## 2015-12-30 LAB — CBC WITH DIFFERENTIAL/PLATELET
Basophils Absolute: 0 10*3/uL (ref 0–0.1)
Basophils Relative: 0 %
EOS ABS: 0.2 10*3/uL (ref 0–0.7)
Eosinophils Relative: 2 %
HEMATOCRIT: 27.4 % — AB (ref 35.0–47.0)
HEMOGLOBIN: 9.5 g/dL — AB (ref 12.0–16.0)
LYMPHS ABS: 0.6 10*3/uL — AB (ref 1.0–3.6)
LYMPHS PCT: 6 %
MCH: 33.4 pg (ref 26.0–34.0)
MCHC: 34.7 g/dL (ref 32.0–36.0)
MCV: 96.1 fL (ref 80.0–100.0)
MONOS PCT: 1 %
Monocytes Absolute: 0.1 10*3/uL — ABNORMAL LOW (ref 0.2–0.9)
NEUTROS ABS: 10.1 10*3/uL — AB (ref 1.4–6.5)
NEUTROS PCT: 91 %
Platelets: 123 10*3/uL — ABNORMAL LOW (ref 150–440)
RBC: 2.85 MIL/uL — AB (ref 3.80–5.20)
RDW: 17.1 % — ABNORMAL HIGH (ref 11.5–14.5)
WBC: 11.1 10*3/uL — AB (ref 3.6–11.0)

## 2015-12-30 LAB — MAGNESIUM: MAGNESIUM: 2 mg/dL (ref 1.7–2.4)

## 2015-12-30 MED ORDER — SODIUM CHLORIDE 0.9 % IV SOLN
Freq: Once | INTRAVENOUS | Status: AC
  Administered 2015-12-30: 09:00:00 via INTRAVENOUS
  Filled 2015-12-30: qty 1000

## 2015-12-30 MED ORDER — HEPARIN SOD (PORK) LOCK FLUSH 100 UNIT/ML IV SOLN
500.0000 [IU] | Freq: Once | INTRAVENOUS | Status: AC
  Administered 2015-12-30: 500 [IU] via INTRAVENOUS
  Filled 2015-12-30: qty 5

## 2015-12-30 MED ORDER — SODIUM CHLORIDE 0.9% FLUSH
10.0000 mL | INTRAVENOUS | Status: DC | PRN
  Administered 2015-12-30: 10 mL via INTRAVENOUS
  Filled 2015-12-30: qty 10

## 2015-12-30 MED ORDER — SODIUM CHLORIDE 0.9 % IV SOLN
Freq: Once | INTRAVENOUS | Status: AC
  Administered 2015-12-30: 10:00:00 via INTRAVENOUS
  Filled 2015-12-30: qty 4

## 2016-01-01 ENCOUNTER — Other Ambulatory Visit: Payer: Self-pay | Admitting: Internal Medicine

## 2016-01-02 ENCOUNTER — Ambulatory Visit
Admission: RE | Admit: 2016-01-02 | Discharge: 2016-01-02 | Disposition: A | Discharge status: Home / Self Care | Payer: Medicare HMO | Source: Ambulatory Visit | Attending: Internal Medicine | Admitting: Internal Medicine

## 2016-01-02 ENCOUNTER — Other Ambulatory Visit: Payer: Self-pay | Admitting: Internal Medicine

## 2016-01-02 ENCOUNTER — Telehealth: Payer: Self-pay | Admitting: Internal Medicine

## 2016-01-02 ENCOUNTER — Inpatient Hospital Stay: Payer: Medicare HMO

## 2016-01-02 VITALS — BP 96/63 | HR 82 | Temp 97.0°F | Resp 18

## 2016-01-02 DIAGNOSIS — M5136 Other intervertebral disc degeneration, lumbar region: Secondary | ICD-10-CM | POA: Diagnosis not present

## 2016-01-02 DIAGNOSIS — C787 Secondary malignant neoplasm of liver and intrahepatic bile duct: Secondary | ICD-10-CM | POA: Diagnosis not present

## 2016-01-02 DIAGNOSIS — I2510 Atherosclerotic heart disease of native coronary artery without angina pectoris: Secondary | ICD-10-CM | POA: Insufficient documentation

## 2016-01-02 DIAGNOSIS — E876 Hypokalemia: Secondary | ICD-10-CM | POA: Diagnosis not present

## 2016-01-02 DIAGNOSIS — C786 Secondary malignant neoplasm of retroperitoneum and peritoneum: Secondary | ICD-10-CM | POA: Diagnosis not present

## 2016-01-02 DIAGNOSIS — Z5111 Encounter for antineoplastic chemotherapy: Secondary | ICD-10-CM | POA: Diagnosis not present

## 2016-01-02 DIAGNOSIS — D1803 Hemangioma of intra-abdominal structures: Secondary | ICD-10-CM | POA: Insufficient documentation

## 2016-01-02 DIAGNOSIS — M47816 Spondylosis without myelopathy or radiculopathy, lumbar region: Secondary | ICD-10-CM | POA: Diagnosis not present

## 2016-01-02 DIAGNOSIS — N2889 Other specified disorders of kidney and ureter: Secondary | ICD-10-CM | POA: Diagnosis not present

## 2016-01-02 DIAGNOSIS — C189 Malignant neoplasm of colon, unspecified: Secondary | ICD-10-CM | POA: Diagnosis not present

## 2016-01-02 DIAGNOSIS — C7A8 Other malignant neuroendocrine tumors: Secondary | ICD-10-CM

## 2016-01-02 DIAGNOSIS — M5137 Other intervertebral disc degeneration, lumbosacral region: Secondary | ICD-10-CM | POA: Insufficient documentation

## 2016-01-02 DIAGNOSIS — C182 Malignant neoplasm of ascending colon: Secondary | ICD-10-CM

## 2016-01-02 DIAGNOSIS — M47817 Spondylosis without myelopathy or radiculopathy, lumbosacral region: Secondary | ICD-10-CM | POA: Diagnosis not present

## 2016-01-02 MED ORDER — HEPARIN SOD (PORK) LOCK FLUSH 100 UNIT/ML IV SOLN
500.0000 [IU] | Freq: Once | INTRAVENOUS | Status: AC
  Administered 2016-01-02: 500 [IU] via INTRAVENOUS

## 2016-01-02 MED ORDER — HEPARIN SOD (PORK) LOCK FLUSH 100 UNIT/ML IV SOLN
INTRAVENOUS | Status: AC
  Filled 2016-01-02: qty 5

## 2016-01-02 MED ORDER — IOPAMIDOL (ISOVUE-300) INJECTION 61%
100.0000 mL | Freq: Once | INTRAVENOUS | Status: AC | PRN
  Administered 2016-01-02: 100 mL via INTRAVENOUS

## 2016-01-02 MED ORDER — SODIUM CHLORIDE 0.9 % IV SOLN
Freq: Once | INTRAVENOUS | Status: AC
  Administered 2016-01-02: 10:00:00 via INTRAVENOUS
  Filled 2016-01-02: qty 1000

## 2016-01-02 NOTE — Telephone Encounter (Signed)
Called pt re: CT REPORT- Pt unavailable.

## 2016-01-07 ENCOUNTER — Inpatient Hospital Stay: Payer: Medicare HMO

## 2016-01-07 ENCOUNTER — Inpatient Hospital Stay (HOSPITAL_BASED_OUTPATIENT_CLINIC_OR_DEPARTMENT_OTHER): Payer: Medicare HMO | Admitting: Internal Medicine

## 2016-01-07 VITALS — BP 136/91 | HR 88 | Temp 97.4°F | Resp 18 | Wt 193.0 lb

## 2016-01-07 DIAGNOSIS — Z5111 Encounter for antineoplastic chemotherapy: Secondary | ICD-10-CM

## 2016-01-07 DIAGNOSIS — C7A8 Other malignant neuroendocrine tumors: Secondary | ICD-10-CM

## 2016-01-07 DIAGNOSIS — N289 Disorder of kidney and ureter, unspecified: Secondary | ICD-10-CM

## 2016-01-07 DIAGNOSIS — Z79899 Other long term (current) drug therapy: Secondary | ICD-10-CM

## 2016-01-07 DIAGNOSIS — C182 Malignant neoplasm of ascending colon: Secondary | ICD-10-CM

## 2016-01-07 DIAGNOSIS — E876 Hypokalemia: Secondary | ICD-10-CM | POA: Insufficient documentation

## 2016-01-07 DIAGNOSIS — R112 Nausea with vomiting, unspecified: Secondary | ICD-10-CM

## 2016-01-07 DIAGNOSIS — C7A022 Malignant carcinoid tumor of the ascending colon: Secondary | ICD-10-CM | POA: Diagnosis not present

## 2016-01-07 DIAGNOSIS — M1990 Unspecified osteoarthritis, unspecified site: Secondary | ICD-10-CM

## 2016-01-07 DIAGNOSIS — I10 Essential (primary) hypertension: Secondary | ICD-10-CM

## 2016-01-07 DIAGNOSIS — R5383 Other fatigue: Secondary | ICD-10-CM

## 2016-01-07 DIAGNOSIS — C7B02 Secondary carcinoid tumors of liver: Secondary | ICD-10-CM

## 2016-01-07 DIAGNOSIS — T451X5S Adverse effect of antineoplastic and immunosuppressive drugs, sequela: Secondary | ICD-10-CM

## 2016-01-07 DIAGNOSIS — R197 Diarrhea, unspecified: Secondary | ICD-10-CM | POA: Insufficient documentation

## 2016-01-07 LAB — CBC WITH DIFFERENTIAL/PLATELET
Basophils Absolute: 0 10*3/uL (ref 0–0.1)
Basophils Relative: 0 %
EOS PCT: 2 %
Eosinophils Absolute: 0.2 10*3/uL (ref 0–0.7)
HCT: 26.3 % — ABNORMAL LOW (ref 35.0–47.0)
Hemoglobin: 9.3 g/dL — ABNORMAL LOW (ref 12.0–16.0)
LYMPHS ABS: 1.2 10*3/uL (ref 1.0–3.6)
LYMPHS PCT: 14 %
MCH: 33.9 pg (ref 26.0–34.0)
MCHC: 35.5 g/dL (ref 32.0–36.0)
MCV: 95.6 fL (ref 80.0–100.0)
MONO ABS: 0.6 10*3/uL (ref 0.2–0.9)
Monocytes Relative: 7 %
Neutro Abs: 6.5 10*3/uL (ref 1.4–6.5)
Neutrophils Relative %: 77 %
PLATELETS: 258 10*3/uL (ref 150–440)
RBC: 2.75 MIL/uL — ABNORMAL LOW (ref 3.80–5.20)
RDW: 18.1 % — AB (ref 11.5–14.5)
WBC: 8.5 10*3/uL (ref 3.6–11.0)

## 2016-01-07 LAB — COMPREHENSIVE METABOLIC PANEL
ALBUMIN: 3.4 g/dL — AB (ref 3.5–5.0)
ALT: 43 U/L (ref 14–54)
AST: 36 U/L (ref 15–41)
Alkaline Phosphatase: 96 U/L (ref 38–126)
Anion gap: 6 (ref 5–15)
BUN: 11 mg/dL (ref 6–20)
CHLORIDE: 103 mmol/L (ref 101–111)
CO2: 27 mmol/L (ref 22–32)
CREATININE: 0.94 mg/dL (ref 0.44–1.00)
Calcium: 8.4 mg/dL — ABNORMAL LOW (ref 8.9–10.3)
GFR calc Af Amer: >60 mL/min (ref >60)
GFR calc non Af Amer: >60 mL/min (ref >60)
GLUCOSE: 102 mg/dL — AB (ref 65–99)
Potassium: 2.7 mmol/L — CL (ref 3.5–5.1)
SODIUM: 136 mmol/L (ref 135–145)
Total Bilirubin: 0.4 mg/dL (ref 0.3–1.2)
Total Protein: 7.2 g/dL (ref 6.5–8.1)

## 2016-01-07 LAB — MAGNESIUM: Magnesium: 1.4 mg/dL — ABNORMAL LOW (ref 1.7–2.4)

## 2016-01-07 MED ORDER — SODIUM CHLORIDE 0.9% FLUSH
10.0000 mL | Freq: Once | INTRAVENOUS | Status: AC
  Administered 2016-01-07: 10 mL via INTRAVENOUS
  Filled 2016-01-07: qty 10

## 2016-01-07 MED ORDER — HEPARIN SOD (PORK) LOCK FLUSH 100 UNIT/ML IV SOLN
500.0000 [IU] | Freq: Once | INTRAVENOUS | Status: AC
Start: 2016-01-07 — End: 2016-01-07
  Administered 2016-01-07: 500 [IU] via INTRAVENOUS
  Filled 2016-01-07: qty 5

## 2016-01-07 MED ORDER — SODIUM CHLORIDE 0.9 % IV SOLN
Freq: Once | INTRAVENOUS | Status: AC
  Administered 2016-01-07: 11:00:00 via INTRAVENOUS
  Filled 2016-01-07: qty 20

## 2016-01-07 NOTE — Progress Notes (Signed)
Patient states her insurance company will not pay for her Zofran.  Asking if we know why.  Accompanied by daughter who is asking if patient could try Reglan.

## 2016-01-07 NOTE — Assessment & Plan Note (Addendum)
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  Clinically no evidence of progression noted. CT-Improved/CEA- trending down.   # HOLD CHEMO today- given the ongoing diarrhea  # severe hypokalemia-potassium 2.9; recommend supplementation  # diarrhea- continue Imodium and Lomotil; check c.diff/ Gi panel.   Hypomagnesemia on supplementation#   # [id number] daughter.   #  follow-up with me in 2 weeks for cycle #4.

## 2016-01-07 NOTE — Progress Notes (Signed)
Kathleen James in clinical lab-cancer center called critical value potassium level 2.7 today.  Read back process performed with Maudie Mercury at [phone].  Dr. Rogue Bussing informed of critical potassium level at 0959 am-read back process performed.

## 2016-01-07 NOTE — Progress Notes (Signed)
[place] NOTE  [name], [name], [name], [name], [name]; [name]; [name]; [name]; [name]; [name]; [name]; [name]; July 14th  2017 CT- PR  # right kidney lesion 2.6 x 1.6 cm ? 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 FOLFIRINOX is here for follow-up patient is status post cycle #3  approximately 2 weeks ago.  Patient has significant diarrhea multiple loose stools over the last many days. Continues to have intermittent nausea with vomiting intermittently takes Zofran and Compazine as needed. She complains of fatigue Denies any skin rash. No fevers or chills. No sores in the mouth. No chest pain or shortness of the cough.  ROS: A complete 10 point review of system is done which is negative except mentioned above in history of present illness  MEDICAL HISTORY:  Past Medical History  Diagnosis Date  . Hypertension   . Obesity (BMI 30-39.9)   . Angiolipoma of kidney     followed with serial CTs ,,  Dr. Jacqlyn Larsen  . Hammer toe   . Arthritis   . Cancer ([place])     colon  . Chemotherapy induced diarrhea   . Neuro-endocrine carcinoma ([place])      SURGICAL HISTORY: Past Surgical History  Procedure Laterality Date  . Abdominal hysterectomy  1995    endometriosis, heavy bleeding  . Oophorectomy    . Rotator cuff repair Right 2008    right shoulder.  [place]   . Cesarean section  1985  . Bunionectomy Bilateral   . Portacath placement Left 10/01/2015    Procedure: INSERTION PORT-A-CATH;  Surgeon: Robert Bellow, MD;  Location: [facility] ORS;  Service: General;  Laterality: Left;  . Colonoscopy with propofol N/A 10/09/2015    Procedure: COLONOSCOPY WITH PROPOFOL;  Surgeon: Robert Bellow, MD;  Location: [facility] ENDOSCOPY;  Service: Endoscopy;  Laterality: N/A;    SOCIAL HISTORY: She lives at home with her family in [place]. 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 Social History   Social History  . Marital Status: Married    Spouse Name: N/A  . Number of Children: N/A  . Years of Education: N/A   Occupational History  . Not on file.   Social History Main Topics  . Smoking status: Never Smoker   . Smokeless tobacco: Never Used  . Alcohol Use: 0.0 oz/week    0 Standard drinks or equivalent per week     Comment: occasionally  . Drug Use: No  . Sexual Activity: Not Currently   Other Topics Concern  . Not on file   Social History Narrative    FAMILY HISTORY: Family History  Problem Relation Age of Onset  .  Mental illness Mother 49    bipolar, dementia  . Cancer Maternal Aunt 70    breast ca  . Hypertension Father   . Stroke Father 47    deceased  . COPD Father   . Heart disease Sister     deceased    ALLERGIES:  is allergic to bee venom and morphine and related.  MEDICATIONS:  Current Outpatient Prescriptions  Medication Sig Dispense Refill  . ALPRAZolam (XANAX) 0.25 MG tablet Take 1 tablet (0.25 mg total) by mouth daily as needed for sleep or anxiety. 90 tablet 5  . amLODipine (NORVASC) 5 MG tablet Take 1 tablet (5 mg total) by mouth daily. 30 tablet 3  .  diphenoxylate-atropine (LOMOTIL) 2.5-0.025 MG tablet Take 1 tablet by mouth 4 (four) times daily as needed for diarrhea or loose stools. 30 tablet 6  . lidocaine-prilocaine (EMLA) cream Apply 1 application topically as needed. Apply to port a cath site 1 hour prior to chemotherapy treatments 30 g 6  . olmesartan (BENICAR) 40 MG tablet Take 1 tablet (40 mg total) by mouth daily. 30 tablet 2  . ondansetron (ZOFRAN) 8 MG tablet Take 1 tablet (8 mg total) by mouth every 6 (six) hours as needed for nausea or vomiting. 30 tablet 3  . potassium chloride SA (K-DUR,KLOR-CON) 20 MEQ tablet Take 2 tablets (40 mEq total) by mouth 2 (two) times daily. 30 tablet 3  . prochlorperazine (COMPAZINE) 10 MG tablet Take 1 tablet (10 mg total) by mouth every 6 (six) hours as needed for nausea or vomiting. 30 tablet 6   No current facility-administered medications for this visit.   Facility-Administered Medications Ordered in Other Visits  Medication Dose Route Frequency Provider Last Rate Last Dose  . heparin lock flush 100 unit/mL  500 Units Intravenous Once [name], MD   500 Units at 12/24/15 1609  . sodium chloride flush (NS) 0.9 % injection 10 mL  10 mL Intravenous PRN [name], MD   10 mL at 11/24/15 0836      .  PHYSICAL EXAMINATION: ECOG PERFORMANCE STATUS: 0 - Asymptomatic  Filed Vitals:   01/07/16 0930  BP: 136/91  Pulse: 88  Temp: 97.4 F (36.3 C)  Resp: 18   Filed Weights   01/07/16 0930  Weight: 193 lb (87.544 kg)    GENERAL: Well-nourished well-developed; Alert, no distress and comfortable.  Accompanied by her Husband. EYES: no pallor or icterus OROPHARYNX: no thrush or ulceration; good dentition  NECK: supple, no masses felt LYMPH:  no palpable lymphadenopathy in the cervical, axillary or inguinal regions LUNGS: clear to auscultation and  No wheeze or crackles HEART/CVS: regular rate & rhythm and no murmurs; No lower extremity edema ABDOMEN: abdomen soft,  non-tender and normal bowel sounds; positive for hepatomegaly. Musculoskeletal:no cyanosis of digits and no clubbing  PSYCH: alert & oriented x 3 with fluent speech NEURO: no focal motor/sensory deficits SKIN:  no rashes or significant lesions; Mediport is in place. Positive for alopecia.  LABORATORY DATA:  I have reviewed the data as listed Lab Results  Component Value Date   WBC 8.5 01/07/2016   HGB 9.3* 01/07/2016   HCT 26.3* 01/07/2016   MCV 95.6 01/07/2016   PLT 258 01/07/2016    Recent Labs  12/24/15 0850 12/30/15 0810 01/07/16 0907  NA 135 131* 136  K 3.3* 3.8 2.7*  CL 107 102 103  CO2 24 25 27   GLUCOSE 111* 84 102*  BUN 10 18 11   CREATININE  0.99 0.87 0.94  CALCIUM 8.7* 8.4* 8.4*  GFRNONAA 59* >60 >60  GFRAA >60 >60 >60  PROT 7.3 7.3 7.2  ALBUMIN 3.3* 3.5 3.4*  AST 24 45* 36  ALT 25 52 43  ALKPHOS 81 87 96  BILITOT 0.2* 0.7 0.4    RADIOGRAPHIC STUDIES: I have personally reviewed the radiological images as listed and agreed with the findings in the report. Ct Abdomen Pelvis W Contrast  01/02/2016  CLINICAL DATA:  Colon cancer restaging, hepatic metastatic disease. EXAM: CT ABDOMEN AND PELVIS WITH CONTRAST TECHNIQUE: Multidetector CT imaging of the abdomen and pelvis was performed using the standard protocol following bolus administration of intravenous contrast. CONTRAST:  166mL ISOVUE-300 IOPAMIDOL (ISOVUE-300) INJECTION 61% COMPARISON:  Multiple exams, including 11/21/2015 FINDINGS: Lower chest: Linear subsegmental atelectasis posteromedially in the right lower lobe. Circumflex coronary artery atherosclerotic calcification. Small type 1 hiatal hernia. Hepatobiliary: The index dominant right hepatic lobe lesion measures 7.7 by 4.9 cm on image 15/2, formerly 9.9 by 6.2 cm. A second lesion in the dome of the junction of the right and left hepatic lobes measures 2.0 by 1.8 cm on image 7/2, formerly 2.7 by 2.5 cm. Other scattered metastatic lesions in the liver are  similarly reduced in size. No new lesion identified. There thought to be several hepatic cysts and a potential hemangioma inferiorly in the right hepatic lobe. Gallbladder unremarkable. Pancreas: Unremarkable Spleen: Unremarkable Adrenals/Urinary Tract: Adrenal glands normal. 2.7 by 1.2 cm enhancing mass with some fat elements of the right kidney posteriorly on image 32/2, similar in size compared to prior by my measurements, but not very mid hypermetabolic on prior PET-CT. This lesion measured 1.7 by 1.1 cm on 03/20/2010. 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 Stomach/Bowel: Descending colon mass is less well demarcated today partially due to dilution of oral contrast in this vicinity. Portions of the adjacent adenopathy/ local extension along the ileocecal mesentery are reduced in prominence, including a lymph node measuring 1.1 cm in short axis on image 44/2 which previously measured 1.8 cm. A local mass or lymph node on image 46 of series 2 is otherwise similar to the prior exam. There is some wall thickening in the distal ileum and potentially in the sigmoid colon and rectum. Air fluid levels in the descending colon. Vascular/Lymphatic: Aortoiliac atherosclerotic vascular disease. Small retroperitoneal lymph nodes are noted. Nodal disease adjacent to the colon mass described above. Reproductive: Uterus absent.  Ovaries not seen. Other: No supplemental non-categorized findings. Musculoskeletal: Lower lumbar spondylosis and degenerative disc disease causing impingement at L4-5 and L5-S1. IMPRESSION: 1. Generally improved appearance with the metastatic liver lesions reduced in size, and some reduction in the adenopathy along the ileocecal mesentery. No new metastatic spread identified. 2. Wall thickening along a long segment of the terminal ileum and also potentially in the distal colon -enteritis and colitis not excluded. There are air- fluid levels in the colon which could  reflect diarrheal process. The 3. Mass along the posterior right kidney is enhancing but has had fatty elements on prior noncontrast imaging, compatible with angiomyolipoma. This has slowly increased in size over the last 6 years. 4. Other imaging findings of potential clinical significance: Coronary artery atherosclerotic calcification. Small type 1 hiatal several hepatic cysts and a right hepatic lobe hemangioma. Hernia. Aortoiliac atherosclerotic vascular disease. Lower lumbar spondylosis and degenerative disc disease causing impingement at L4-5 and L5-S1. Electronically Signed   By: Van Clines M.D.   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  Clinically no evidence of progression noted. CT-Improved/CEA- trending down.   # HOLD CHEMO today- given the ongoing diarrhea  # severe hypokalemia-potassium 2.9; recommend supplementation  # diarrhea- continue Imodium and Lomotil; check c.diff/ Gi panel.   Hypomagnesemia on supplementation#   # [id number] daughter.   #  follow-up with me in 2 weeks for cycle #4.      [name], MD 01/07/2016 10:49 PM

## 2016-01-08 ENCOUNTER — Other Ambulatory Visit: Payer: Self-pay | Admitting: *Deleted

## 2016-01-08 ENCOUNTER — Telehealth: Payer: Self-pay | Admitting: *Deleted

## 2016-01-08 DIAGNOSIS — C7A8 Other malignant neuroendocrine tumors: Secondary | ICD-10-CM

## 2016-01-08 DIAGNOSIS — Z5111 Encounter for antineoplastic chemotherapy: Secondary | ICD-10-CM | POA: Diagnosis not present

## 2016-01-08 DIAGNOSIS — R197 Diarrhea, unspecified: Secondary | ICD-10-CM

## 2016-01-08 LAB — GASTROINTESTINAL PANEL BY PCR, STOOL (REPLACES STOOL CULTURE)

## 2016-01-08 LAB — CLOSTRIDIUM DIFFICILE BY PCR: Toxigenic C. Difficile by PCR: NEGATIVE

## 2016-01-08 LAB — CEA: CEA: 415.6 ng/mL — ABNORMAL HIGH (ref 0.0–4.7)

## 2016-01-08 NOTE — Telephone Encounter (Signed)
-----   Message from [name], MD sent at 01/08/2016  4:07 PM EDT ----- Please inform pt/daughter-erin that stool tests negative for infection. And also the cea is getting better. Dr.B

## 2016-01-09 ENCOUNTER — Other Ambulatory Visit: Payer: Self-pay | Admitting: *Deleted

## 2016-01-09 ENCOUNTER — Inpatient Hospital Stay: Payer: Medicare HMO

## 2016-01-09 DIAGNOSIS — E876 Hypokalemia: Secondary | ICD-10-CM

## 2016-01-09 MED ORDER — POTASSIUM CHLORIDE ER 10 MEQ PO TBCR: 10.0000 meq | EXTENDED_RELEASE_TABLET | Freq: Two times a day (BID) | ORAL | Status: DC

## 2016-01-09 NOTE — Telephone Encounter (Signed)
Called patient to inform her that stool tests were negative for infection.  Also CEA is getting better.  Verbalized understanding.

## 2016-01-19 ENCOUNTER — Inpatient Hospital Stay: Payer: Medicare HMO

## 2016-01-19 ENCOUNTER — Inpatient Hospital Stay (HOSPITAL_BASED_OUTPATIENT_CLINIC_OR_DEPARTMENT_OTHER): Payer: Medicare HMO | Admitting: Internal Medicine

## 2016-01-19 VITALS — BP 129/84 | HR 99 | Temp 98.7°F | Resp 18 | Wt 192.4 lb

## 2016-01-19 DIAGNOSIS — R112 Nausea with vomiting, unspecified: Secondary | ICD-10-CM

## 2016-01-19 DIAGNOSIS — N289 Disorder of kidney and ureter, unspecified: Secondary | ICD-10-CM

## 2016-01-19 DIAGNOSIS — C7A8 Other malignant neuroendocrine tumors: Secondary | ICD-10-CM

## 2016-01-19 DIAGNOSIS — C799 Secondary malignant neoplasm of unspecified site: Secondary | ICD-10-CM | POA: Diagnosis not present

## 2016-01-19 DIAGNOSIS — I10 Essential (primary) hypertension: Secondary | ICD-10-CM

## 2016-01-19 DIAGNOSIS — R5383 Other fatigue: Secondary | ICD-10-CM

## 2016-01-19 DIAGNOSIS — Z5111 Encounter for antineoplastic chemotherapy: Secondary | ICD-10-CM | POA: Diagnosis not present

## 2016-01-19 DIAGNOSIS — C7B02 Secondary carcinoid tumors of liver: Secondary | ICD-10-CM | POA: Diagnosis not present

## 2016-01-19 DIAGNOSIS — Z803 Family history of malignant neoplasm of breast: Secondary | ICD-10-CM

## 2016-01-19 DIAGNOSIS — C189 Malignant neoplasm of colon, unspecified: Secondary | ICD-10-CM

## 2016-01-19 DIAGNOSIS — E876 Hypokalemia: Secondary | ICD-10-CM

## 2016-01-19 DIAGNOSIS — T451X5S Adverse effect of antineoplastic and immunosuppressive drugs, sequela: Secondary | ICD-10-CM

## 2016-01-19 DIAGNOSIS — R197 Diarrhea, unspecified: Secondary | ICD-10-CM

## 2016-01-19 DIAGNOSIS — C7A022 Malignant carcinoid tumor of the ascending colon: Secondary | ICD-10-CM

## 2016-01-19 DIAGNOSIS — Z7689 Persons encountering health services in other specified circumstances: Secondary | ICD-10-CM

## 2016-01-19 DIAGNOSIS — C182 Malignant neoplasm of ascending colon: Secondary | ICD-10-CM

## 2016-01-19 DIAGNOSIS — Z79899 Other long term (current) drug therapy: Secondary | ICD-10-CM

## 2016-01-19 DIAGNOSIS — M1990 Unspecified osteoarthritis, unspecified site: Secondary | ICD-10-CM

## 2016-01-19 LAB — COMPREHENSIVE METABOLIC PANEL
ALK PHOS: 101 U/L (ref 38–126)
ALT: 26 U/L (ref 14–54)
ANION GAP: 7 (ref 5–15)
AST: 25 U/L (ref 15–41)
Albumin: 3.2 g/dL — ABNORMAL LOW (ref 3.5–5.0)
BILIRUBIN TOTAL: 0.7 mg/dL (ref 0.3–1.2)
BUN: 14 mg/dL (ref 6–20)
CALCIUM: 8.5 mg/dL — AB (ref 8.9–10.3)
CO2: 22 mmol/L (ref 22–32)
Chloride: 103 mmol/L (ref 101–111)
Creatinine, Ser: 1.03 mg/dL — ABNORMAL HIGH (ref 0.44–1.00)
GFR calc non Af Amer: 56 mL/min — ABNORMAL LOW (ref >60)
Glucose, Bld: 98 mg/dL (ref 65–99)
Potassium: 3.7 mmol/L (ref 3.5–5.1)
Sodium: 132 mmol/L — ABNORMAL LOW (ref 135–145)
TOTAL PROTEIN: 8.2 g/dL — AB (ref 6.5–8.1)

## 2016-01-19 LAB — CBC WITH DIFFERENTIAL/PLATELET
BASOS ABS: 0.1 10*3/uL (ref 0–0.1)
BASOS PCT: 1 %
Eosinophils Absolute: 0 10*3/uL (ref 0–0.7)
Eosinophils Relative: 0 %
HEMATOCRIT: 26.1 % — AB (ref 35.0–47.0)
HEMOGLOBIN: 9.3 g/dL — AB (ref 12.0–16.0)
Lymphocytes Relative: 13 %
Lymphs Abs: 1.3 10*3/uL (ref 1.0–3.6)
MCH: 34.2 pg — ABNORMAL HIGH (ref 26.0–34.0)
MCHC: 35.5 g/dL (ref 32.0–36.0)
MCV: 96.4 fL (ref 80.0–100.0)
Monocytes Absolute: 1 10*3/uL — ABNORMAL HIGH (ref 0.2–0.9)
Monocytes Relative: 10 %
NEUTROS ABS: 8.3 10*3/uL — AB (ref 1.4–6.5)
NEUTROS PCT: 76 %
Platelets: 506 10*3/uL — ABNORMAL HIGH (ref 150–440)
RBC: 2.71 MIL/uL — ABNORMAL LOW (ref 3.80–5.20)
RDW: 18.6 % — AB (ref 11.5–14.5)
WBC: 10.8 10*3/uL (ref 3.6–11.0)

## 2016-01-19 LAB — MAGNESIUM: Magnesium: 1.9 mg/dL (ref 1.7–2.4)

## 2016-01-19 MED ORDER — LEUCOVORIN CALCIUM INJECTION 350 MG
800.0000 mg | Freq: Once | INTRAVENOUS | Status: AC
  Administered 2016-01-19: 800 mg via INTRAVENOUS
  Filled 2016-01-19: qty 25

## 2016-01-19 MED ORDER — LORAZEPAM 2 MG/ML IJ SOLN
0.5000 mg | Freq: Once | INTRAMUSCULAR | Status: AC
  Administered 2016-01-19: 0.5 mg via INTRAVENOUS
  Filled 2016-01-19: qty 1

## 2016-01-19 MED ORDER — IRINOTECAN HCL CHEMO INJECTION 100 MG/5ML
100.0000 mg/m2 | Freq: Once | INTRAVENOUS | Status: AC
  Administered 2016-01-19: 200 mg via INTRAVENOUS
  Filled 2016-01-19: qty 10

## 2016-01-19 MED ORDER — DEXTROSE 5 % IV SOLN
Freq: Once | INTRAVENOUS | Status: AC
  Administered 2016-01-19: 11:00:00 via INTRAVENOUS
  Filled 2016-01-19: qty 1000

## 2016-01-19 MED ORDER — SODIUM CHLORIDE 0.9 % IV SOLN
2400.0000 mg/m2 | INTRAVENOUS | Status: DC
  Administered 2016-01-19: 4900 mg via INTRAVENOUS
  Filled 2016-01-19: qty 98

## 2016-01-19 MED ORDER — PALONOSETRON HCL INJECTION 0.25 MG/5ML
0.2500 mg | Freq: Once | INTRAVENOUS | Status: AC
  Administered 2016-01-19: 0.25 mg via INTRAVENOUS
  Filled 2016-01-19: qty 5

## 2016-01-19 MED ORDER — OXALIPLATIN CHEMO INJECTION 100 MG/20ML
85.0000 mg/m2 | Freq: Once | INTRAVENOUS | Status: AC
  Administered 2016-01-19: 175 mg via INTRAVENOUS
  Filled 2016-01-19: qty 35

## 2016-01-19 MED ORDER — ATROPINE SULFATE 1 MG/ML IJ SOLN
0.5000 mg | Freq: Once | INTRAMUSCULAR | Status: AC | PRN
  Administered 2016-01-19: 0.5 mg via INTRAVENOUS
  Filled 2016-01-19: qty 1

## 2016-01-19 MED ORDER — SODIUM CHLORIDE 0.9 % IV SOLN
Freq: Once | INTRAVENOUS | Status: AC
  Administered 2016-01-19: 11:00:00 via INTRAVENOUS
  Filled 2016-01-19: qty 5

## 2016-01-19 NOTE — Progress Notes (Signed)
[name]  [name], [name], [name], [name], [name]; [name]; [name]; [name]; [name]; [name]; [name]; [name]; July 14th  2017 CT- PR  # right kidney lesion 2.6 x 1.6 cm ? 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 FOLFIRINOX is here for follow-up patient is status post cycle #3  approximately 4 weeks ago. She was on a vacation last week; however very tired  Diarrhea resolved. No nausea no vomiting. She complains of fatigue Denies any skin rash. No fevers or chills. No sores in the mouth. No chest pain or shortness of the cough.  ROS: A complete 10 point review of system is done which is negative except mentioned above in history of present illness  MEDICAL HISTORY:  Past Medical History:  Diagnosis Date  . Angiolipoma of kidney    followed with serial CTs ,,  Dr. Jacqlyn Larsen  . Arthritis   . Cancer ([place])    colon  . Chemotherapy induced diarrhea   . Hammer toe   . Hypertension   . Neuro-endocrine carcinoma ([place])   . Obesity (BMI 30-39.9)     SURGICAL HISTORY: Past Surgical History:  Procedure Laterality Date  . ABDOMINAL  HYSTERECTOMY  1995   endometriosis, heavy bleeding  . BUNIONECTOMY Bilateral   . CESAREAN SECTION  1985  . COLONOSCOPY WITH PROPOFOL N/A 10/09/2015   Procedure: COLONOSCOPY WITH PROPOFOL;  Surgeon: Robert Bellow, MD;  Location: [facility] ENDOSCOPY;  Service: Endoscopy;  Laterality: N/A;  . OOPHORECTOMY    . PORTACATH PLACEMENT Left 10/01/2015   Procedure: INSERTION PORT-A-CATH;  Surgeon: Robert Bellow, MD;  Location: [facility] ORS;  Service: General;  Laterality: Left;  . ROTATOR CUFF REPAIR Right 2008   right shoulder.  [place]     SOCIAL HISTORY: She lives at home with her family in [place]. 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 Social History   Social History  . Marital status: Married    Spouse name: N/A  . Number of children: N/A  . Years of education: N/A   Occupational History  . Not on file.   Social History Main Topics  . Smoking status: Never Smoker  . Smokeless tobacco: Never Used  . Alcohol use 0.0 oz/week     Comment: occasionally  . Drug use: No  . Sexual activity: Not Currently   Other Topics Concern  . Not on file   Social History Narrative  . No narrative on file    FAMILY HISTORY: Family History  Problem Relation Age of Onset  . Mental illness Mother 56    bipolar, dementia  . Cancer Maternal Aunt 20  breast ca  . Hypertension Father   . Stroke Father 74    deceased  . COPD Father   . Heart disease Sister     deceased    ALLERGIES:  is allergic to bee venom and morphine and related.  MEDICATIONS:  Current Outpatient Prescriptions  Medication Sig Dispense Refill  . ALPRAZolam (XANAX) 0.25 MG tablet Take 1 tablet (0.25 mg total) by mouth daily as needed for sleep or anxiety. 90 tablet 5  . amLODipine (NORVASC) 5 MG tablet Take 1 tablet (5 mg total) by mouth daily. 30 tablet 3  . diphenoxylate-atropine (LOMOTIL) 2.5-0.025 MG tablet Take 1 tablet by mouth 4 (four) times daily as needed for diarrhea  or loose stools. 30 tablet 6  . lidocaine-prilocaine (EMLA) cream Apply 1 application topically as needed. Apply to port a cath site 1 hour prior to chemotherapy treatments 30 g 6  . olmesartan (BENICAR) 40 MG tablet Take 1 tablet (40 mg total) by mouth daily. 30 tablet 2  . ondansetron (ZOFRAN) 8 MG tablet Take 1 tablet (8 mg total) by mouth every 6 (six) hours as needed for nausea or vomiting. 30 tablet 3  . potassium chloride (K-DUR) 10 MEQ tablet Take 1 tablet (10 mEq total) by mouth 2 (two) times daily. 60 tablet 3  . potassium chloride SA (K-DUR,KLOR-CON) 20 MEQ tablet Take 2 tablets (40 mEq total) by mouth 2 (two) times daily. 30 tablet 3  . prochlorperazine (COMPAZINE) 10 MG tablet Take 1 tablet (10 mg total) by mouth every 6 (six) hours as needed for nausea or vomiting. 30 tablet 6   No current facility-administered medications for this visit.    Facility-Administered Medications Ordered in Other Visits  Medication Dose Route Frequency Provider Last Rate Last Dose  . heparin lock flush 100 unit/mL  500 Units Intravenous Once [name], MD      . sodium chloride flush (NS) 0.9 % injection 10 mL  10 mL Intravenous PRN [name], MD   10 mL at 11/24/15 0836      .  PHYSICAL EXAMINATION: ECOG PERFORMANCE STATUS: 0 - Asymptomatic  Vitals:   01/19/16 0941  BP: 129/84  Pulse: 99  Resp: 18  Temp: 98.7 F (37.1 C)   Filed Weights   01/19/16 0941  Weight: 192 lb 6 oz (87.3 kg)    GENERAL: Well-nourished well-developed; Alert, no distress and comfortable.  Accompanied by her Husband. EYES: no pallor or icterus OROPHARYNX: no thrush or ulceration; good dentition  NECK: supple, no masses felt LYMPH:  no palpable lymphadenopathy in the cervical, axillary or inguinal regions LUNGS: clear to auscultation and  No wheeze or crackles HEART/CVS: regular rate & rhythm and no murmurs; No lower extremity edema ABDOMEN: abdomen soft, non-tender and normal bowel  sounds; positive for hepatomegaly. Musculoskeletal:no cyanosis of digits and no clubbing  PSYCH: alert & oriented x 3 with fluent speech NEURO: no focal motor/sensory deficits SKIN:  no rashes or significant lesions; Mediport is in place. Positive for alopecia.  LABORATORY DATA:  I have reviewed the data as listed Lab Results  Component Value Date   WBC 10.8 01/19/2016   HGB 9.3 (L) 01/19/2016   HCT 26.1 (L) 01/19/2016   MCV 96.4 01/19/2016   PLT 506 (H) 01/19/2016    Recent Labs  12/30/15 0810 01/07/16 0907 01/19/16 0855  NA 131* 136 132*  K 3.8 2.7* 3.7  CL 102 103 103  CO2 25 27 22   GLUCOSE 84 102* 98  BUN 18 11 14   CREATININE 0.87 0.94 1.03*  CALCIUM 8.4* 8.4* 8.5*  GFRNONAA >60 >60 56*  GFRAA >60 >60 >60  PROT 7.3 7.2 8.2*  ALBUMIN 3.5 3.4* 3.2*  AST 45* 36 25  ALT 52 43 26  ALKPHOS 87 96 101  BILITOT 0.7 0.4 0.7    RADIOGRAPHIC STUDIES: I have personally reviewed the radiological images as listed and agreed with the findings in the report. Ct Abdomen Pelvis W Contrast  Result Date: 01/02/2016 CLINICAL DATA:  Colon cancer restaging, hepatic metastatic disease. EXAM: CT ABDOMEN AND PELVIS WITH CONTRAST TECHNIQUE: Multidetector CT imaging of the abdomen and pelvis was performed using the standard protocol following bolus administration of intravenous contrast. CONTRAST:  160mL ISOVUE-300 IOPAMIDOL (ISOVUE-300) INJECTION 61% COMPARISON:  Multiple exams, including 11/21/2015 FINDINGS: Lower chest: Linear subsegmental atelectasis posteromedially in the right lower lobe. Circumflex coronary artery atherosclerotic calcification. Small type 1 hiatal hernia. Hepatobiliary: The index dominant right hepatic lobe lesion measures 7.7 by 4.9 cm on image 15/2, formerly 9.9 by 6.2 cm. A second lesion in the dome of the junction of the right and left hepatic lobes measures 2.0 by 1.8 cm on image 7/2, formerly 2.7 by 2.5 cm. Other scattered metastatic lesions in the liver are similarly  reduced in size. No new lesion identified. There thought to be several hepatic cysts and a potential hemangioma inferiorly in the right hepatic lobe. Gallbladder unremarkable. Pancreas: Unremarkable Spleen: Unremarkable Adrenals/Urinary Tract: Adrenal glands normal. 2.7 by 1.2 cm enhancing mass with some fat elements of the right kidney posteriorly on image 32/2, similar in size compared to prior by my measurements, but not very mid hypermetabolic on prior PET-CT. This lesion measured 1.7 by 1.1 cm on 03/20/2010. 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 Stomach/Bowel: Descending colon mass is less well demarcated today partially due to dilution of oral contrast in this vicinity. Portions of the adjacent adenopathy/ local extension along the ileocecal mesentery are reduced in prominence, including a lymph node measuring 1.1 cm in short axis on image 44/2 which previously measured 1.8 cm. A local mass or lymph node on image 46 of series 2 is otherwise similar to the prior exam. There is some wall thickening in the distal ileum and potentially in the sigmoid colon and rectum. Air fluid levels in the descending colon. Vascular/Lymphatic: Aortoiliac atherosclerotic vascular disease. Small retroperitoneal lymph nodes are noted. Nodal disease adjacent to the colon mass described above. Reproductive: Uterus absent.  Ovaries not seen. Other: No supplemental non-categorized findings. Musculoskeletal: Lower lumbar spondylosis and degenerative disc disease causing impingement at L4-5 and L5-S1. IMPRESSION: 1. Generally improved appearance with the metastatic liver lesions reduced in size, and some reduction in the adenopathy along the ileocecal mesentery. No new metastatic spread identified. 2. Wall thickening along a long segment of the terminal ileum and also potentially in the distal colon -enteritis and colitis not excluded. There are air- fluid levels in the colon which could reflect  diarrheal process. The 3. Mass along the posterior right kidney is enhancing but has had fatty elements on prior noncontrast imaging, compatible with angiomyolipoma. This has slowly increased in size over the last 6 years. 4. Other imaging findings of potential clinical significance: Coronary artery atherosclerotic calcification. Small type 1 hiatal several hepatic cysts and a right hepatic lobe hemangioma. Hernia. Aortoiliac atherosclerotic vascular disease. Lower lumbar spondylosis and degenerative disc disease causing impingement at L4-5 and L5-S1. Electronically Signed   By: Cindra Eves.D.  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  Clinically no evidence of progression noted. CT-Improved/CEA- trending down.   # continue FOLFIRINOX today; labs- okay [se below]. Decrease iri to 100mg /2. Will plan CT in appx 2 months.   # severe hypokalemia-potassium 2.9- improved; recommend supplementation. Continue  # Hypoalbuminemia- cont Protein intake.   # diarrhea- continue Imodium and Lomotil; NEG c.diff/ Gi panel.   # [id number] daughter.   #  follow-up with me in 2 weeks/ chemo.  IVFs/Labs in 1 week.     [name], MD 01/21/2016 6:27 PM

## 2016-01-19 NOTE — Assessment & Plan Note (Addendum)
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  Clinically no evidence of progression noted. CT-Improved/CEA- trending down.   # continue FOLFIRINOX today; labs- okay [se below]. Decrease iri to 100mg /2. Will plan CT in appx 2 months.   # severe hypokalemia-potassium 2.9- improved; recommend supplementation. Continue  # Hypoalbuminemia- cont Protein intake.   # diarrhea- continue Imodium and Lomotil; NEG c.diff/ Gi panel.   # [id number]- [id number] daughter.   #  follow-up with me in 2 weeks/ chemo.  IVFs/Labs in 1 week.

## 2016-01-20 LAB — THYROID PANEL WITH TSH
Free Thyroxine Index: 2.7 (ref 1.2–4.9)
T3 UPTAKE RATIO: 27 % (ref 24–39)
T4 TOTAL: 10.1 ug/dL (ref 4.5–12.0)
TSH: 1.53 u[IU]/mL (ref 0.450–4.500)

## 2016-01-21 ENCOUNTER — Inpatient Hospital Stay: Payer: Medicare HMO | Attending: Internal Medicine

## 2016-01-21 ENCOUNTER — Inpatient Hospital Stay: Payer: Medicare HMO

## 2016-01-21 VITALS — BP 99/67 | HR 84 | Resp 18

## 2016-01-21 DIAGNOSIS — R5383 Other fatigue: Secondary | ICD-10-CM | POA: Insufficient documentation

## 2016-01-21 DIAGNOSIS — R197 Diarrhea, unspecified: Secondary | ICD-10-CM | POA: Insufficient documentation

## 2016-01-21 DIAGNOSIS — C799 Secondary malignant neoplasm of unspecified site: Secondary | ICD-10-CM

## 2016-01-21 DIAGNOSIS — Z7689 Persons encountering health services in other specified circumstances: Secondary | ICD-10-CM | POA: Insufficient documentation

## 2016-01-21 DIAGNOSIS — D1771 Benign lipomatous neoplasm of kidney: Secondary | ICD-10-CM | POA: Insufficient documentation

## 2016-01-21 DIAGNOSIS — E860 Dehydration: Secondary | ICD-10-CM | POA: Diagnosis not present

## 2016-01-21 DIAGNOSIS — C801 Malignant (primary) neoplasm, unspecified: Secondary | ICD-10-CM

## 2016-01-21 DIAGNOSIS — E669 Obesity, unspecified: Secondary | ICD-10-CM | POA: Insufficient documentation

## 2016-01-21 DIAGNOSIS — R200 Anesthesia of skin: Secondary | ICD-10-CM | POA: Diagnosis not present

## 2016-01-21 DIAGNOSIS — C7A8 Other malignant neuroendocrine tumors: Secondary | ICD-10-CM | POA: Diagnosis not present

## 2016-01-21 DIAGNOSIS — E876 Hypokalemia: Secondary | ICD-10-CM | POA: Insufficient documentation

## 2016-01-21 DIAGNOSIS — R112 Nausea with vomiting, unspecified: Secondary | ICD-10-CM | POA: Insufficient documentation

## 2016-01-21 DIAGNOSIS — E8809 Other disorders of plasma-protein metabolism, not elsewhere classified: Secondary | ICD-10-CM | POA: Insufficient documentation

## 2016-01-21 DIAGNOSIS — C189 Malignant neoplasm of colon, unspecified: Secondary | ICD-10-CM

## 2016-01-21 DIAGNOSIS — C182 Malignant neoplasm of ascending colon: Secondary | ICD-10-CM

## 2016-01-21 DIAGNOSIS — I10 Essential (primary) hypertension: Secondary | ICD-10-CM | POA: Insufficient documentation

## 2016-01-21 DIAGNOSIS — C7B8 Other secondary neuroendocrine tumors: Secondary | ICD-10-CM | POA: Diagnosis not present

## 2016-01-21 DIAGNOSIS — M129 Arthropathy, unspecified: Secondary | ICD-10-CM | POA: Diagnosis not present

## 2016-01-21 DIAGNOSIS — Z79899 Other long term (current) drug therapy: Secondary | ICD-10-CM | POA: Insufficient documentation

## 2016-01-21 MED ORDER — HEPARIN SOD (PORK) LOCK FLUSH 100 UNIT/ML IV SOLN
500.0000 [IU] | Freq: Once | INTRAVENOUS | Status: AC
  Administered 2016-01-21: 500 [IU] via INTRAVENOUS

## 2016-01-21 MED ORDER — SODIUM CHLORIDE 0.9 % IJ SOLN
10.0000 mL | Freq: Once | INTRAMUSCULAR | Status: AC
  Administered 2016-01-21: 10 mL via INTRAVENOUS
  Filled 2016-01-21: qty 10

## 2016-01-21 MED ORDER — HEPARIN SOD (PORK) LOCK FLUSH 100 UNIT/ML IV SOLN
INTRAVENOUS | Status: AC
  Filled 2016-01-21: qty 5

## 2016-01-21 MED ORDER — PEGFILGRASTIM 6 MG/0.6ML ~~LOC~~ PSKT
6.0000 mg | PREFILLED_SYRINGE | Freq: Once | SUBCUTANEOUS | Status: AC
  Administered 2016-01-21: 6 mg via SUBCUTANEOUS
  Filled 2016-01-21: qty 0.6

## 2016-01-21 MED ORDER — SODIUM CHLORIDE 0.9 % IV SOLN
Freq: Once | INTRAVENOUS | Status: AC
  Administered 2016-01-21: 15:00:00 via INTRAVENOUS
  Filled 2016-01-21: qty 1000

## 2016-01-21 NOTE — Progress Notes (Signed)
Pt came back to cancer center. Transported by Daughter. Pt walked into cancer with complaints of diaphoresis and dizziness. "I think I am going to pass out."  D/C pump from chemo at 1:30 pm today. Pt went out to store to run errands. She stated that she felt "sick" and daughter immediately transported pt back to c.ctr.  Pt placed in w/c. MD at bedside. V/o obtained to run IV fluids Normal Saline at 94ml/hr.  bp upon arrival 71/45/. HR 69. Sats 98. Visible signs of being diaphoretic.  Repeat BP 83/57  HR 73.

## 2016-01-21 NOTE — Progress Notes (Signed)
Patient's BP returned to normal (see flowsheet), spoke with Dr. Rogue Bussing who said to have her check her BP tomorrow and call the clinic with results; drink fluids with salt; let us know how she is feeling tomorrow.  Otherwise go to the ER if condition worsens before then. Kathleen James

## 2016-01-26 ENCOUNTER — Inpatient Hospital Stay: Payer: Medicare HMO

## 2016-01-26 VITALS — BP 133/87 | HR 96 | Temp 97.8°F | Resp 16

## 2016-01-26 DIAGNOSIS — C7A8 Other malignant neuroendocrine tumors: Secondary | ICD-10-CM

## 2016-01-26 DIAGNOSIS — C182 Malignant neoplasm of ascending colon: Secondary | ICD-10-CM

## 2016-01-26 DIAGNOSIS — C799 Secondary malignant neoplasm of unspecified site: Secondary | ICD-10-CM | POA: Diagnosis not present

## 2016-01-26 LAB — CBC WITH DIFFERENTIAL/PLATELET
BASOS ABS: 0 10*3/uL (ref 0–0.1)
BASOS PCT: 0 %
EOS ABS: 0 10*3/uL (ref 0–0.7)
EOS PCT: 0 %
HCT: 25.3 % — ABNORMAL LOW (ref 35.0–47.0)
Hemoglobin: 8.8 g/dL — ABNORMAL LOW (ref 12.0–16.0)
LYMPHS PCT: 12 %
Lymphs Abs: 1.4 10*3/uL (ref 1.0–3.6)
MCH: 33.2 pg (ref 26.0–34.0)
MCHC: 34.9 g/dL (ref 32.0–36.0)
MCV: 95 fL (ref 80.0–100.0)
MONO ABS: 0.2 10*3/uL (ref 0.2–0.9)
Monocytes Relative: 2 %
Neutro Abs: 9.7 10*3/uL — ABNORMAL HIGH (ref 1.4–6.5)
Neutrophils Relative %: 86 %
PLATELETS: 332 10*3/uL (ref 150–440)
RBC: 2.66 MIL/uL — AB (ref 3.80–5.20)
RDW: 17.6 % — AB (ref 11.5–14.5)
WBC: 11.4 10*3/uL — AB (ref 3.6–11.0)

## 2016-01-26 LAB — BASIC METABOLIC PANEL
Anion gap: 7 (ref 5–15)
BUN: 13 mg/dL (ref 6–20)
CALCIUM: 8.5 mg/dL — AB (ref 8.9–10.3)
CO2: 23 mmol/L (ref 22–32)
CREATININE: 1.02 mg/dL — AB (ref 0.44–1.00)
Chloride: 100 mmol/L — ABNORMAL LOW (ref 101–111)
GFR calc Af Amer: >60 mL/min (ref >60)
GFR, EST NON AFRICAN AMERICAN: 56 mL/min — AB (ref >60)
GLUCOSE: 106 mg/dL — AB (ref 65–99)
POTASSIUM: 3.7 mmol/L (ref 3.5–5.1)
SODIUM: 130 mmol/L — AB (ref 135–145)

## 2016-01-26 LAB — MAGNESIUM: MAGNESIUM: 1.9 mg/dL (ref 1.7–2.4)

## 2016-01-26 MED ORDER — SODIUM CHLORIDE 0.9 % IJ SOLN
10.0000 mL | INTRAMUSCULAR | Status: DC | PRN
  Administered 2016-01-26: 10 mL
  Filled 2016-01-26: qty 10

## 2016-01-26 MED ORDER — SODIUM CHLORIDE 0.9 % IV SOLN
Freq: Once | INTRAVENOUS | Status: AC
  Administered 2016-01-26: 11:00:00 via INTRAVENOUS
  Filled 2016-01-26: qty 4

## 2016-01-26 MED ORDER — SODIUM CHLORIDE 0.9 % IV SOLN
Freq: Once | INTRAVENOUS | Status: AC
  Administered 2016-01-26: 11:00:00 via INTRAVENOUS
  Filled 2016-01-26: qty 1000

## 2016-01-26 MED ORDER — HEPARIN SOD (PORK) LOCK FLUSH 100 UNIT/ML IV SOLN
500.0000 [IU] | Freq: Once | INTRAVENOUS | Status: AC | PRN
  Administered 2016-01-26: 500 [IU]

## 2016-02-02 ENCOUNTER — Inpatient Hospital Stay (HOSPITAL_BASED_OUTPATIENT_CLINIC_OR_DEPARTMENT_OTHER): Payer: Medicare HMO | Admitting: Internal Medicine

## 2016-02-02 ENCOUNTER — Inpatient Hospital Stay: Payer: Medicare HMO

## 2016-02-02 VITALS — BP 148/94 | HR 98 | Temp 98.6°F | Resp 18 | Wt 192.0 lb

## 2016-02-02 DIAGNOSIS — E8809 Other disorders of plasma-protein metabolism, not elsewhere classified: Secondary | ICD-10-CM

## 2016-02-02 DIAGNOSIS — C7A8 Other malignant neuroendocrine tumors: Secondary | ICD-10-CM | POA: Diagnosis not present

## 2016-02-02 DIAGNOSIS — R112 Nausea with vomiting, unspecified: Secondary | ICD-10-CM

## 2016-02-02 DIAGNOSIS — C182 Malignant neoplasm of ascending colon: Secondary | ICD-10-CM

## 2016-02-02 DIAGNOSIS — Z7689 Persons encountering health services in other specified circumstances: Secondary | ICD-10-CM | POA: Diagnosis not present

## 2016-02-02 DIAGNOSIS — D1771 Benign lipomatous neoplasm of kidney: Secondary | ICD-10-CM

## 2016-02-02 DIAGNOSIS — C799 Secondary malignant neoplasm of unspecified site: Secondary | ICD-10-CM

## 2016-02-02 DIAGNOSIS — Z79899 Other long term (current) drug therapy: Secondary | ICD-10-CM

## 2016-02-02 DIAGNOSIS — C787 Secondary malignant neoplasm of liver and intrahepatic bile duct: Secondary | ICD-10-CM | POA: Diagnosis not present

## 2016-02-02 DIAGNOSIS — Z5111 Encounter for antineoplastic chemotherapy: Secondary | ICD-10-CM

## 2016-02-02 DIAGNOSIS — R197 Diarrhea, unspecified: Secondary | ICD-10-CM

## 2016-02-02 DIAGNOSIS — C7A022 Malignant carcinoid tumor of the ascending colon: Secondary | ICD-10-CM

## 2016-02-02 DIAGNOSIS — C189 Malignant neoplasm of colon, unspecified: Secondary | ICD-10-CM

## 2016-02-02 DIAGNOSIS — M129 Arthropathy, unspecified: Secondary | ICD-10-CM

## 2016-02-02 DIAGNOSIS — E669 Obesity, unspecified: Secondary | ICD-10-CM

## 2016-02-02 DIAGNOSIS — I10 Essential (primary) hypertension: Secondary | ICD-10-CM

## 2016-02-02 LAB — COMPREHENSIVE METABOLIC PANEL
ALBUMIN: 2.9 g/dL — AB (ref 3.5–5.0)
ALT: 28 U/L (ref 14–54)
AST: 30 U/L (ref 15–41)
Alkaline Phosphatase: 119 U/L (ref 38–126)
Anion gap: 7 (ref 5–15)
BILIRUBIN TOTAL: 0.4 mg/dL (ref 0.3–1.2)
BUN: 16 mg/dL (ref 6–20)
CALCIUM: 8.5 mg/dL — AB (ref 8.9–10.3)
CHLORIDE: 106 mmol/L (ref 101–111)
CO2: 22 mmol/L (ref 22–32)
CREATININE: 1.06 mg/dL — AB (ref 0.44–1.00)
GFR calc Af Amer: >60 mL/min (ref >60)
GFR calc non Af Amer: 54 mL/min — ABNORMAL LOW (ref >60)
GLUCOSE: 104 mg/dL — AB (ref 65–99)
POTASSIUM: 3.7 mmol/L (ref 3.5–5.1)
Sodium: 135 mmol/L (ref 135–145)
TOTAL PROTEIN: 7.7 g/dL (ref 6.5–8.1)

## 2016-02-02 LAB — CBC WITH DIFFERENTIAL/PLATELET
BASOS ABS: 0 10*3/uL (ref 0–0.1)
Eosinophils Absolute: 0.1 10*3/uL (ref 0–0.7)
Eosinophils Relative: 1 %
HEMATOCRIT: 23.3 % — AB (ref 35.0–47.0)
Hemoglobin: 8.2 g/dL — ABNORMAL LOW (ref 12.0–16.0)
Lymphs Abs: 1.6 10*3/uL (ref 1.0–3.6)
MCH: 32.9 pg (ref 26.0–34.0)
MCHC: 35.1 g/dL (ref 32.0–36.0)
MCV: 93.8 fL (ref 80.0–100.0)
MONO ABS: 0.8 10*3/uL (ref 0.2–0.9)
Monocytes Relative: 6 %
NEUTROS ABS: 9.3 10*3/uL — AB (ref 1.4–6.5)
Platelets: 372 10*3/uL (ref 150–440)
RBC: 2.48 MIL/uL — AB (ref 3.80–5.20)
RDW: 19.1 % — AB (ref 11.5–14.5)
WBC: 11.8 10*3/uL — AB (ref 3.6–11.0)

## 2016-02-02 LAB — MAGNESIUM: MAGNESIUM: 1.7 mg/dL (ref 1.7–2.4)

## 2016-02-02 MED ORDER — SODIUM CHLORIDE 0.9% FLUSH
10.0000 mL | INTRAVENOUS | Status: DC | PRN
  Administered 2016-02-02: 10 mL
  Filled 2016-02-02: qty 10

## 2016-02-02 MED ORDER — ATROPINE SULFATE 1 MG/ML IJ SOLN
0.5000 mg | Freq: Once | INTRAMUSCULAR | Status: AC | PRN
  Administered 2016-02-02: 0.5 mg via INTRAVENOUS
  Filled 2016-02-02: qty 1

## 2016-02-02 MED ORDER — IRINOTECAN HCL CHEMO INJECTION 100 MG/5ML
100.0000 mg/m2 | Freq: Once | INTRAVENOUS | Status: AC
  Administered 2016-02-02: 200 mg via INTRAVENOUS
  Filled 2016-02-02: qty 10

## 2016-02-02 MED ORDER — SODIUM CHLORIDE 0.9 % IV SOLN
2400.0000 mg/m2 | INTRAVENOUS | Status: DC
  Administered 2016-02-02: 4900 mg via INTRAVENOUS
  Filled 2016-02-02: qty 98

## 2016-02-02 MED ORDER — PALONOSETRON HCL INJECTION 0.25 MG/5ML
0.2500 mg | Freq: Once | INTRAVENOUS | Status: AC
  Administered 2016-02-02: 0.25 mg via INTRAVENOUS
  Filled 2016-02-02: qty 5

## 2016-02-02 MED ORDER — DEXTROSE 5 % IV SOLN
Freq: Once | INTRAVENOUS | Status: AC
  Administered 2016-02-02: 10:00:00 via INTRAVENOUS
  Filled 2016-02-02: qty 1000

## 2016-02-02 MED ORDER — LEUCOVORIN CALCIUM INJECTION 350 MG
800.0000 mg | Freq: Once | INTRAVENOUS | Status: AC
  Administered 2016-02-02: 800 mg via INTRAVENOUS
  Filled 2016-02-02: qty 17.5

## 2016-02-02 MED ORDER — FOSAPREPITANT DIMEGLUMINE INJECTION 150 MG
Freq: Once | INTRAVENOUS | Status: AC
  Administered 2016-02-02: 11:00:00 via INTRAVENOUS
  Filled 2016-02-02: qty 5

## 2016-02-02 MED ORDER — OXALIPLATIN CHEMO INJECTION 100 MG/20ML
85.0000 mg/m2 | Freq: Once | INTRAVENOUS | Status: AC
  Administered 2016-02-02: 175 mg via INTRAVENOUS
  Filled 2016-02-02: qty 20

## 2016-02-02 NOTE — Progress Notes (Signed)
[name], [name], [name], [name], [name]; [name]; [name]; [name]; [name]; [name]; [name]; [name]; July 14th  2017 CT- PR  # right kidney lesion 2.6 x 1.6 cm ? 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 FOLFIRINOX is here for follow-up patient is status post cycle #4  approximately 2 weeks ago.   Patient had episode of lightheadedness/needing IV fluids when she came in for pump removal approximately 2 weeks ago.   Diarrhea resolved. Intermittent nausea and vomiting. Improved with her antiemetics. She complains of fatigue Denies any skin rash. No fevers or chills. No sores in the mouth. No chest pain or shortness of the cough.  ROS: A complete 10 point review of system is done which is negative except mentioned above in history of present illness  MEDICAL HISTORY:  Past Medical History:  Diagnosis Date  . Angiolipoma of kidney    followed with serial CTs ,,  Dr. Jacqlyn Larsen  . Arthritis   . Cancer ([place])    colon  . Chemotherapy induced diarrhea   . Hammer toe   . Hypertension   . Neuro-endocrine carcinoma ([place])    . Obesity (BMI 30-39.9)     SURGICAL HISTORY: Past Surgical History:  Procedure Laterality Date  . ABDOMINAL HYSTERECTOMY  1995   endometriosis, heavy bleeding  . BUNIONECTOMY Bilateral   . CESAREAN SECTION  1985  . COLONOSCOPY WITH PROPOFOL N/A 10/09/2015   Procedure: COLONOSCOPY WITH PROPOFOL;  Surgeon: Robert Bellow, MD;  Location: [facility] ENDOSCOPY;  Service: Endoscopy;  Laterality: N/A;  . OOPHORECTOMY    . PORTACATH PLACEMENT Left 10/01/2015   Procedure: INSERTION PORT-A-CATH;  Surgeon: Robert Bellow, MD;  Location: [facility] ORS;  Service: General;  Laterality: Left;  . ROTATOR CUFF REPAIR Right 2008   right shoulder.  [place]     SOCIAL HISTORY: She lives at home with her family in [place]. 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 Social History   Social History  . Marital status: Married    Spouse name: N/A  . Number of children: N/A  . Years of education: N/A   Occupational History  . Not on file.   Social History Main Topics  . Smoking status: Never Smoker  . Smokeless tobacco: Never Used  . Alcohol use 0.0 oz/week     Comment: occasionally  . Drug use: No  . Sexual activity: Not Currently   Other Topics Concern  . Not on file   Social History Narrative  . No narrative on file    FAMILY HISTORY: Family History  Problem Relation Age of Onset  .  Mental illness Mother 25    bipolar, dementia  . Cancer Maternal Aunt 70    breast ca  . Hypertension Father   . Stroke Father 71    deceased  . COPD Father   . Heart disease Sister     deceased    ALLERGIES:  is allergic to bee venom and morphine and related.  MEDICATIONS:  Current Outpatient Prescriptions  Medication Sig Dispense Refill  . ALPRAZolam (XANAX) 0.25 MG tablet Take 1 tablet (0.25 mg total) by mouth daily as needed for sleep or anxiety. 90 tablet 5  . amLODipine (NORVASC) 5 MG tablet Take 1 tablet (5 mg total) by mouth daily. 30 tablet 3  .  diphenoxylate-atropine (LOMOTIL) 2.5-0.025 MG tablet Take 1 tablet by mouth 4 (four) times daily as needed for diarrhea or loose stools. 30 tablet 6  . lidocaine-prilocaine (EMLA) cream Apply 1 application topically as needed. Apply to port a cath site 1 hour prior to chemotherapy treatments 30 g 6  . olmesartan (BENICAR) 40 MG tablet Take 1 tablet (40 mg total) by mouth daily. 30 tablet 2  . ondansetron (ZOFRAN) 8 MG tablet Take 1 tablet (8 mg total) by mouth every 6 (six) hours as needed for nausea or vomiting. 30 tablet 3  . potassium chloride (K-DUR) 10 MEQ tablet Take 1 tablet (10 mEq total) by mouth 2 (two) times daily. 60 tablet 3  . potassium chloride SA (K-DUR,KLOR-CON) 20 MEQ tablet Take 2 tablets (40 mEq total) by mouth 2 (two) times daily. 30 tablet 3  . prochlorperazine (COMPAZINE) 10 MG tablet Take 1 tablet (10 mg total) by mouth every 6 (six) hours as needed for nausea or vomiting. 30 tablet 6   No current facility-administered medications for this visit.    Facility-Administered Medications Ordered in Other Visits  Medication Dose Route Frequency Provider Last Rate Last Dose  . fluorouracil (ADRUCIL) 4,900 mg in sodium chloride 0.9 % 52 mL chemo infusion  2,400 mg/m2 (Treatment Plan Recorded) Intravenous 1 day or 1 dose [name], MD   4,900 mg at 02/02/16 1535  . heparin lock flush 100 unit/mL  500 Units Intravenous Once [name], MD      . sodium chloride flush (NS) 0.9 % injection 10 mL  10 mL Intravenous PRN [name], MD   10 mL at 11/24/15 0836  . sodium chloride flush (NS) 0.9 % injection 10 mL  10 mL Intracatheter PRN [name], MD   10 mL at 02/02/16 0918      .  PHYSICAL EXAMINATION: ECOG PERFORMANCE STATUS: 0 - Asymptomatic  Vitals:   02/02/16 0927  BP: (!) 148/94  Pulse: 98  Resp: 18  Temp: 98.6 F (37 C)   Filed Weights   02/02/16 0927  Weight: 192 lb (87.1 kg)    GENERAL: Well-nourished well-developed;  Alert, no distress and comfortable.  Accompanied by Daughter. EYES: no pallor or icterus OROPHARYNX: no thrush or ulceration; good dentition  NECK: supple, no masses felt LYMPH:  no palpable lymphadenopathy in the cervical, axillary or inguinal regions LUNGS: clear to auscultation and  No wheeze or crackles HEART/CVS: regular rate & rhythm and no murmurs; No lower extremity edema ABDOMEN: abdomen soft, non-tender and normal bowel sounds; positive for hepatomegaly. Musculoskeletal:no cyanosis of digits and no clubbing  PSYCH: alert & oriented x 3 with fluent speech NEURO: no focal motor/sensory deficits SKIN:  no rashes or significant lesions; Mediport is in place. Positive for alopecia.  LABORATORY DATA:  I have reviewed the data as listed Lab Results  Component Value Date   WBC 11.8 (H) 02/02/2016   HGB 8.2 (L) 02/02/2016   HCT 23.3 (L) 02/02/2016   MCV 93.8 02/02/2016   PLT 372 02/02/2016    Recent Labs  01/07/16 0907 01/19/16 0855 01/26/16 1025 02/02/16 0903  NA 136 132* 130* 135  K 2.7* 3.7 3.7 3.7  CL 103 103 100* 106  CO2 27 22 23 22   GLUCOSE 102* 98 106* 104*  BUN 11 14 13 16   CREATININE 0.94 1.03* 1.02* 1.06*  CALCIUM 8.4* 8.5* 8.5* 8.5*  GFRNONAA >60 56* 56* 54*  GFRAA >60 >60 >60 >60  PROT 7.2 8.2*  --  7.7  ALBUMIN 3.4* 3.2*  --  2.9*  AST 36 25  --  30  ALT 43 26  --  28  ALKPHOS 96 101  --  119  BILITOT 0.4 0.7  --  0.4    RADIOGRAPHIC STUDIES: I have personally reviewed the radiological images as listed and agreed with the findings in the report. No results found.  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  Clinically no evidence of progression noted. CT-Improved/CEA- trending down.   # continue FOLFIRINOX today; labs- okay [se below]. Decrease iri to 100mg /2. Will plan CT in appx 2 months. Discussed the  addition of avastin- discussed the potential AEs- including HTN; proteunia; would healing issues etc.   # Hypoalbuminemia- cont Protein intake.   # diarrhea- continue Imodium and Lomotil;   #  follow-up with me in 2 weeks/ chemo.  IVFs- in appx 4 days; Labs in 1 week.     [name], MD 02/02/2016 5:36 PM

## 2016-02-02 NOTE — Assessment & Plan Note (Addendum)
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  Clinically no evidence of progression noted. CT-Improved/CEA- trending down.   # continue FOLFIRINOX today; labs- okay [se below]. Decrease iri to 100mg /2. Will plan CT in appx 2 months. Discussed the addition of avastin- discussed the potential AEs- including HTN; proteunia; would healing issues etc.   # Dehydration-needing IV fluids; IV fluids as needed.   # Hypoalbuminemia- cont Protein intake.   # diarrhea- continue Imodium and Lomotil;   #  follow-up with me in 2 weeks/ chemo.  IVFs- in appx 4 days; Labs in 1 week.

## 2016-02-03 LAB — CEA: CEA: 242.1 ng/mL — AB (ref 0.0–4.7)

## 2016-02-04 ENCOUNTER — Other Ambulatory Visit: Payer: Self-pay

## 2016-02-04 ENCOUNTER — Inpatient Hospital Stay: Payer: Medicare HMO

## 2016-02-04 ENCOUNTER — Ambulatory Visit: Payer: Self-pay

## 2016-02-04 VITALS — BP 116/79 | HR 83 | Temp 96.5°F | Resp 18

## 2016-02-04 DIAGNOSIS — C182 Malignant neoplasm of ascending colon: Secondary | ICD-10-CM

## 2016-02-04 DIAGNOSIS — C7A8 Other malignant neuroendocrine tumors: Secondary | ICD-10-CM

## 2016-02-04 DIAGNOSIS — C799 Secondary malignant neoplasm of unspecified site: Secondary | ICD-10-CM

## 2016-02-04 DIAGNOSIS — C801 Malignant (primary) neoplasm, unspecified: Secondary | ICD-10-CM

## 2016-02-04 DIAGNOSIS — C189 Malignant neoplasm of colon, unspecified: Secondary | ICD-10-CM

## 2016-02-04 MED ORDER — HEPARIN SOD (PORK) LOCK FLUSH 100 UNIT/ML IV SOLN
INTRAVENOUS | Status: AC
  Filled 2016-02-04: qty 5

## 2016-02-04 MED ORDER — ONDANSETRON HCL 40 MG/20ML IJ SOLN
Freq: Once | INTRAMUSCULAR | Status: AC
  Administered 2016-02-04: 14:00:00 via INTRAVENOUS
  Filled 2016-02-04: qty 4

## 2016-02-04 MED ORDER — SODIUM CHLORIDE 0.9 % IJ SOLN
10.0000 mL | Freq: Once | INTRAMUSCULAR | Status: AC
  Filled 2016-02-04: qty 10

## 2016-02-04 MED ORDER — PEGFILGRASTIM 6 MG/0.6ML ~~LOC~~ PSKT
6.0000 mg | PREFILLED_SYRINGE | Freq: Once | SUBCUTANEOUS | Status: AC
  Administered 2016-02-04: 6 mg via SUBCUTANEOUS
  Filled 2016-02-04: qty 0.6

## 2016-02-04 MED ORDER — SODIUM CHLORIDE 0.9 % IJ SOLN
10.0000 mL | Freq: Once | INTRAMUSCULAR | Status: AC
  Administered 2016-02-04: 10 mL via INTRAVENOUS
  Filled 2016-02-04: qty 10

## 2016-02-04 MED ORDER — HEPARIN SOD (PORK) LOCK FLUSH 100 UNIT/ML IV SOLN
500.0000 [IU] | Freq: Once | INTRAVENOUS | Status: AC
  Administered 2016-02-04: 500 [IU] via INTRAVENOUS
  Filled 2016-02-04: qty 5

## 2016-02-04 MED ORDER — SODIUM CHLORIDE 0.9 % IV SOLN
Freq: Once | INTRAVENOUS | Status: AC
  Administered 2016-02-04: 14:00:00 via INTRAVENOUS
  Filled 2016-02-04: qty 1000

## 2016-02-04 MED ORDER — SODIUM CHLORIDE 0.9 % IV SOLN
INTRAVENOUS | Status: DC
  Administered 2016-02-04: 14:00:00 via INTRAVENOUS
  Filled 2016-02-04: qty 1000

## 2016-02-06 ENCOUNTER — Ambulatory Visit: Payer: Self-pay

## 2016-02-09 ENCOUNTER — Inpatient Hospital Stay: Payer: Medicare HMO

## 2016-02-09 DIAGNOSIS — C7A8 Other malignant neuroendocrine tumors: Secondary | ICD-10-CM | POA: Diagnosis not present

## 2016-02-09 DIAGNOSIS — C182 Malignant neoplasm of ascending colon: Secondary | ICD-10-CM

## 2016-02-09 LAB — BASIC METABOLIC PANEL
Anion gap: 6 (ref 5–15)
BUN: 20 mg/dL (ref 6–20)
CALCIUM: 9 mg/dL (ref 8.9–10.3)
CO2: 23 mmol/L (ref 22–32)
CREATININE: 0.88 mg/dL (ref 0.44–1.00)
Chloride: 103 mmol/L (ref 101–111)
Glucose, Bld: 104 mg/dL — ABNORMAL HIGH (ref 65–99)
Potassium: 3.8 mmol/L (ref 3.5–5.1)
SODIUM: 132 mmol/L — AB (ref 135–145)

## 2016-02-09 LAB — CBC WITH DIFFERENTIAL/PLATELET
BASOS PCT: 0 %
Basophils Absolute: 0 10*3/uL (ref 0–0.1)
EOS ABS: 0.2 10*3/uL (ref 0–0.7)
EOS PCT: 1 %
HCT: 26.2 % — ABNORMAL LOW (ref 35.0–47.0)
HEMOGLOBIN: 8.9 g/dL — AB (ref 12.0–16.0)
Lymphocytes Relative: 13 %
Lymphs Abs: 1.7 10*3/uL (ref 1.0–3.6)
MCH: 32.1 pg (ref 26.0–34.0)
MCHC: 34.1 g/dL (ref 32.0–36.0)
MCV: 94.1 fL (ref 80.0–100.0)
MONO ABS: 0.3 10*3/uL (ref 0.2–0.9)
MONOS PCT: 2 %
NEUTROS PCT: 84 %
Neutro Abs: 11.2 10*3/uL — ABNORMAL HIGH (ref 1.4–6.5)
PLATELETS: 348 10*3/uL (ref 150–440)
RBC: 2.78 MIL/uL — ABNORMAL LOW (ref 3.80–5.20)
RDW: 19.1 % — AB (ref 11.5–14.5)
WBC: 13.4 10*3/uL — ABNORMAL HIGH (ref 3.6–11.0)

## 2016-02-09 LAB — SAMPLE TO BLOOD BANK

## 2016-02-09 LAB — MAGNESIUM: MAGNESIUM: 1.9 mg/dL (ref 1.7–2.4)

## 2016-02-10 ENCOUNTER — Telehealth: Payer: Self-pay | Admitting: *Deleted

## 2016-02-10 ENCOUNTER — Other Ambulatory Visit: Payer: Self-pay | Admitting: Internal Medicine

## 2016-02-10 ENCOUNTER — Telehealth: Payer: Self-pay | Admitting: Internal Medicine

## 2016-02-10 NOTE — Telephone Encounter (Signed)
Spoke to insurance; delicned folfirinox+ avastin; Recommending folfiri+ Avastin submission- should be approved. Dr.B  Ph: [phone]  Ext. [phone]  Dr. Truddie Coco;  Will need Patient's  Id-- [id number]

## 2016-02-10 NOTE — Telephone Encounter (Signed)
Notified by Otho Perl in prior auth-avastin needs peer to peer.  Dr. Rogue Bussing please contact [phone]  Ext. [phone]  Dr. Truddie Coco  Will need Patient's  Id-- [id number]

## 2016-02-16 ENCOUNTER — Inpatient Hospital Stay: Payer: Medicare HMO

## 2016-02-16 ENCOUNTER — Inpatient Hospital Stay (HOSPITAL_BASED_OUTPATIENT_CLINIC_OR_DEPARTMENT_OTHER): Payer: Medicare HMO | Admitting: Internal Medicine

## 2016-02-16 VITALS — BP 155/95 | HR 94 | Resp 18

## 2016-02-16 VITALS — BP 148/78 | HR 83 | Temp 97.1°F | Resp 18 | Wt 190.2 lb

## 2016-02-16 DIAGNOSIS — E669 Obesity, unspecified: Secondary | ICD-10-CM

## 2016-02-16 DIAGNOSIS — E860 Dehydration: Secondary | ICD-10-CM

## 2016-02-16 DIAGNOSIS — Z79899 Other long term (current) drug therapy: Secondary | ICD-10-CM

## 2016-02-16 DIAGNOSIS — R112 Nausea with vomiting, unspecified: Secondary | ICD-10-CM

## 2016-02-16 DIAGNOSIS — E876 Hypokalemia: Secondary | ICD-10-CM

## 2016-02-16 DIAGNOSIS — C799 Secondary malignant neoplasm of unspecified site: Secondary | ICD-10-CM

## 2016-02-16 DIAGNOSIS — Z7689 Persons encountering health services in other specified circumstances: Secondary | ICD-10-CM

## 2016-02-16 DIAGNOSIS — E8809 Other disorders of plasma-protein metabolism, not elsewhere classified: Secondary | ICD-10-CM

## 2016-02-16 DIAGNOSIS — R197 Diarrhea, unspecified: Secondary | ICD-10-CM

## 2016-02-16 DIAGNOSIS — R200 Anesthesia of skin: Secondary | ICD-10-CM

## 2016-02-16 DIAGNOSIS — C189 Malignant neoplasm of colon, unspecified: Secondary | ICD-10-CM

## 2016-02-16 DIAGNOSIS — D1771 Benign lipomatous neoplasm of kidney: Secondary | ICD-10-CM

## 2016-02-16 DIAGNOSIS — M129 Arthropathy, unspecified: Secondary | ICD-10-CM

## 2016-02-16 DIAGNOSIS — R5383 Other fatigue: Secondary | ICD-10-CM

## 2016-02-16 DIAGNOSIS — C7A8 Other malignant neuroendocrine tumors: Secondary | ICD-10-CM

## 2016-02-16 DIAGNOSIS — C182 Malignant neoplasm of ascending colon: Secondary | ICD-10-CM

## 2016-02-16 DIAGNOSIS — Z5111 Encounter for antineoplastic chemotherapy: Secondary | ICD-10-CM

## 2016-02-16 DIAGNOSIS — I10 Essential (primary) hypertension: Secondary | ICD-10-CM

## 2016-02-16 DIAGNOSIS — C7B8 Other secondary neuroendocrine tumors: Secondary | ICD-10-CM

## 2016-02-16 LAB — URINALYSIS COMPLETE WITH MICROSCOPIC (ARMC ONLY)
BILIRUBIN URINE: NEGATIVE
GLUCOSE, UA: NEGATIVE mg/dL
KETONES UR: NEGATIVE mg/dL
Nitrite: POSITIVE — AB
Protein, ur: NEGATIVE mg/dL
SPECIFIC GRAVITY, URINE: 1.016 (ref 1.005–1.030)
pH: 5 (ref 5.0–8.0)

## 2016-02-16 LAB — CBC WITH DIFFERENTIAL/PLATELET
BASOS ABS: 0.1 10*3/uL (ref 0–0.1)
Basophils Relative: 0 %
Eosinophils Absolute: 0.2 10*3/uL (ref 0–0.7)
Eosinophils Relative: 1 %
HEMATOCRIT: 27 % — AB (ref 35.0–47.0)
Hemoglobin: 9.1 g/dL — ABNORMAL LOW (ref 12.0–16.0)
LYMPHS ABS: 2.1 10*3/uL (ref 1.0–3.6)
LYMPHS PCT: 13 %
MCH: 33 pg (ref 26.0–34.0)
MCHC: 33.7 g/dL (ref 32.0–36.0)
MCV: 97.9 fL (ref 80.0–100.0)
Monocytes Absolute: 0.9 10*3/uL (ref 0.2–0.9)
Monocytes Relative: 6 %
NEUTROS ABS: 12.7 10*3/uL — AB (ref 1.4–6.5)
Neutrophils Relative %: 80 %
Platelets: 241 10*3/uL (ref 150–440)
RBC: 2.76 MIL/uL — AB (ref 3.80–5.20)
RDW: 21.5 % — ABNORMAL HIGH (ref 11.5–14.5)
WBC: 15.9 10*3/uL — AB (ref 3.6–11.0)

## 2016-02-16 LAB — COMPREHENSIVE METABOLIC PANEL
ALT: 26 U/L (ref 14–54)
AST: 31 U/L (ref 15–41)
Albumin: 3.5 g/dL (ref 3.5–5.0)
Alkaline Phosphatase: 111 U/L (ref 38–126)
Anion gap: 8 (ref 5–15)
BUN: 14 mg/dL (ref 6–20)
CHLORIDE: 106 mmol/L (ref 101–111)
CO2: 22 mmol/L (ref 22–32)
CREATININE: 0.93 mg/dL (ref 0.44–1.00)
Calcium: 8.8 mg/dL — ABNORMAL LOW (ref 8.9–10.3)
GFR calc Af Amer: >60 mL/min (ref >60)
GFR calc non Af Amer: >60 mL/min (ref >60)
Glucose, Bld: 101 mg/dL — ABNORMAL HIGH (ref 65–99)
Potassium: 3.3 mmol/L — ABNORMAL LOW (ref 3.5–5.1)
Sodium: 136 mmol/L (ref 135–145)
Total Bilirubin: 0.5 mg/dL (ref 0.3–1.2)
Total Protein: 7.7 g/dL (ref 6.5–8.1)

## 2016-02-16 LAB — MAGNESIUM: Magnesium: 1.5 mg/dL — ABNORMAL LOW (ref 1.7–2.4)

## 2016-02-16 MED ORDER — LEUCOVORIN CALCIUM INJECTION 350 MG
800.0000 mg | Freq: Once | INTRAVENOUS | Status: AC
  Administered 2016-02-16: 800 mg via INTRAVENOUS
  Filled 2016-02-16: qty 35

## 2016-02-16 MED ORDER — SODIUM CHLORIDE 0.9% FLUSH
10.0000 mL | INTRAVENOUS | Status: DC | PRN
  Administered 2016-02-16: 10 mL
  Filled 2016-02-16: qty 10

## 2016-02-16 MED ORDER — ATROPINE SULFATE 1 MG/ML IJ SOLN
0.5000 mg | Freq: Once | INTRAMUSCULAR | Status: AC | PRN
  Administered 2016-02-16: 0.5 mg via INTRAVENOUS
  Filled 2016-02-16: qty 1

## 2016-02-16 MED ORDER — SODIUM CHLORIDE 0.9 % IV SOLN
2400.0000 mg/m2 | INTRAVENOUS | Status: DC
  Administered 2016-02-16: 4750 mg via INTRAVENOUS
  Filled 2016-02-16: qty 95

## 2016-02-16 MED ORDER — SODIUM CHLORIDE 0.9 % IV SOLN
Freq: Once | INTRAVENOUS | Status: AC
  Administered 2016-02-16: 10:00:00 via INTRAVENOUS
  Filled 2016-02-16: qty 1000

## 2016-02-16 MED ORDER — PALONOSETRON HCL INJECTION 0.25 MG/5ML
0.2500 mg | Freq: Once | INTRAVENOUS | Status: AC
  Administered 2016-02-16: 0.25 mg via INTRAVENOUS
  Filled 2016-02-16: qty 5

## 2016-02-16 MED ORDER — HEPARIN SOD (PORK) LOCK FLUSH 100 UNIT/ML IV SOLN: 500.0000 [IU] | Freq: Once | INTRAVENOUS | Status: DC | PRN

## 2016-02-16 MED ORDER — SODIUM CHLORIDE 0.9 % IV SOLN
10.0000 mg | Freq: Once | INTRAVENOUS | Status: AC
  Administered 2016-02-16: 10 mg via INTRAVENOUS
  Filled 2016-02-16: qty 1

## 2016-02-16 MED ORDER — IRINOTECAN HCL CHEMO INJECTION 100 MG/5ML
180.0000 mg/m2 | Freq: Once | INTRAVENOUS | Status: AC
  Administered 2016-02-16: 360 mg via INTRAVENOUS
  Filled 2016-02-16: qty 15

## 2016-02-16 MED ORDER — SODIUM CHLORIDE 0.9 % IV SOLN
400.0000 mg | Freq: Once | INTRAVENOUS | Status: AC
  Administered 2016-02-16: 400 mg via INTRAVENOUS
  Filled 2016-02-16: qty 16

## 2016-02-16 NOTE — Progress Notes (Signed)
Patient's BP elevated.  171/101  HR 83.  Patient states she had dreaded coming to [facility] today.

## 2016-02-16 NOTE — Progress Notes (Signed)
[place] NOTE  [name], [name], [name], [name], [name]; [name]; [name]; [name]; [name]; [name]; [name]; [name]; July 14th  2017 CT- PR  # AUG 28th- FOLFIRI +Avastin   # right kidney lesion 2.6 x 1.6 cm ? 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  Patient complains of fatigue. Otherwise intermittent diarrhea improved with Lomotil and Imodium. She complains of cramping in the legs and hands post-oxaliplatin infusion. Complains of mild tingling and numbness.   Intermittent nausea and vomiting. Improved with her antiemetics. She complains of fatigue Denies any skin rash. No fevers or chills. No sores in the mouth. No chest pain or shortness of the cough.  ROS: A complete 10 point review of system is done which is negative except mentioned above in history of present illness  MEDICAL HISTORY:  Past Medical History:  Diagnosis Date  . Angiolipoma of kidney    followed with serial CTs  ,,  Dr. Jacqlyn Larsen  . Arthritis   . Cancer ([place])    colon  . Chemotherapy induced diarrhea   . Hammer toe   . Hypertension   . Neuro-endocrine carcinoma ([place])   . Obesity (BMI 30-39.9)     SURGICAL HISTORY: Past Surgical History:  Procedure Laterality Date  . ABDOMINAL HYSTERECTOMY  1995   endometriosis, heavy bleeding  . BUNIONECTOMY Bilateral   . CESAREAN SECTION  1985  . COLONOSCOPY WITH PROPOFOL N/A 10/09/2015   Procedure: COLONOSCOPY WITH PROPOFOL;  Surgeon: Robert Bellow, MD;  Location: [facility] ENDOSCOPY;  Service: Endoscopy;  Laterality: N/A;  . OOPHORECTOMY    . PORTACATH PLACEMENT Left 10/01/2015   Procedure: INSERTION PORT-A-CATH;  Surgeon: Robert Bellow, MD;  Location: [facility] ORS;  Service: General;  Laterality: Left;  . ROTATOR CUFF REPAIR Right 2008   right shoulder.  [place]     SOCIAL HISTORY: She lives at home with her family in [place]. 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 Social History   Social History  . Marital status: Married    Spouse name: N/A  . Number of children: N/A  . Years of education: N/A   Occupational History  . Not on file.   Social History Main Topics  . Smoking status: Never Smoker  . Smokeless tobacco: Never Used  . Alcohol use 0.0 oz/week     Comment: occasionally  . Drug use: No  . Sexual activity: Not Currently   Other Topics Concern  .  Not on file   Social History Narrative  . No narrative on file    FAMILY HISTORY: Family History  Problem Relation Age of Onset  . Mental illness Mother 22    bipolar, dementia  . Cancer Maternal Aunt 70    breast ca  . Hypertension Father   . Stroke Father 80    deceased  . COPD Father   . Heart disease Sister     deceased    ALLERGIES:  is allergic to bee venom and morphine and related.  MEDICATIONS:  Current Outpatient Prescriptions  Medication Sig Dispense Refill  . ALPRAZolam (XANAX) 0.25 MG tablet Take 1 tablet (0.25 mg  total) by mouth daily as needed for sleep or anxiety. 90 tablet 5  . amLODipine (NORVASC) 5 MG tablet Take 1 tablet (5 mg total) by mouth daily. 30 tablet 3  . diphenoxylate-atropine (LOMOTIL) 2.5-0.025 MG tablet Take 1 tablet by mouth 4 (four) times daily as needed for diarrhea or loose stools. 30 tablet 6  . lidocaine-prilocaine (EMLA) cream Apply 1 application topically as needed. Apply to port a cath site 1 hour prior to chemotherapy treatments 30 g 6  . olmesartan (BENICAR) 40 MG tablet Take 1 tablet (40 mg total) by mouth daily. 30 tablet 2  . ondansetron (ZOFRAN) 8 MG tablet Take 1 tablet (8 mg total) by mouth every 6 (six) hours as needed for nausea or vomiting. 30 tablet 3  . potassium chloride (K-DUR) 10 MEQ tablet Take 1 tablet (10 mEq total) by mouth 2 (two) times daily. 60 tablet 3  . potassium chloride SA (K-DUR,KLOR-CON) 20 MEQ tablet Take 2 tablets (40 mEq total) by mouth 2 (two) times daily. 30 tablet 3  . prochlorperazine (COMPAZINE) 10 MG tablet Take 1 tablet (10 mg total) by mouth every 6 (six) hours as needed for nausea or vomiting. 30 tablet 6   No current facility-administered medications for this visit.    Facility-Administered Medications Ordered in Other Visits  Medication Dose Route Frequency Provider Last Rate Last Dose  . heparin lock flush 100 unit/mL  500 Units Intravenous Once [name], MD      . sodium chloride flush (NS) 0.9 % injection 10 mL  10 mL Intravenous PRN [name], MD   10 mL at 11/24/15 0836      .  PHYSICAL EXAMINATION: ECOG PERFORMANCE STATUS: 0 - Asymptomatic  Vitals:   02/16/16 0924 02/16/16 0925  BP:  (!) 148/78  Pulse:    Resp: 18   Temp:     Filed Weights   02/16/16 0902  Weight: 190 lb 4 oz (86.3 kg)    GENERAL: Well-nourished well-developed; Alert, no distress and comfortable.  Accompanied by husband.  EYES: no pallor or icterus OROPHARYNX: no thrush or ulceration; good dentition  NECK: supple, no  masses felt LYMPH:  no palpable lymphadenopathy in the cervical, axillary or inguinal regions LUNGS: clear to auscultation and  No wheeze or crackles HEART/CVS: regular rate & rhythm and no murmurs; No lower extremity edema ABDOMEN: abdomen soft, non-tender and normal bowel sounds; positive for hepatomegaly. Musculoskeletal:no cyanosis of digits and no clubbing  PSYCH: alert & oriented x 3 with fluent speech NEURO: no focal motor/sensory deficits SKIN:  no rashes or significant lesions; Mediport is in place. Positive for alopecia.  LABORATORY DATA:  I have reviewed the data as listed Lab Results  Component Value Date   WBC 15.9 (H) 02/16/2016   HGB 9.1 (L) 02/16/2016  HCT 27.0 (L) 02/16/2016   MCV 97.9 02/16/2016   PLT 241 02/16/2016    Recent Labs  01/19/16 0855  02/02/16 0903 02/09/16 1007 02/16/16 0850  NA 132*  < > 135 132* 136  K 3.7  < > 3.7 3.8 3.3*  CL 103  < > 106 103 106  CO2 22  < > 22 23 22   GLUCOSE 98  < > 104* 104* 101*  BUN 14  < > 16 20 14   CREATININE 1.03*  < > 1.06* 0.88 0.93  CALCIUM 8.5*  < > 8.5* 9.0 8.8*  GFRNONAA 56*  < > 54* >60 >60  GFRAA >60  < > >60 >60 >60  PROT 8.2*  --  7.7  --  7.7  ALBUMIN 3.2*  --  2.9*  --  3.5  AST 25  --  30  --  31  ALT 26  --  28  --  26  ALKPHOS 101  --  119  --  111  BILITOT 0.7  --  0.4  --  0.5  < > = values in this interval not displayed.  RADIOGRAPHIC STUDIES: I have personally reviewed the radiological images as listed and agreed with the findings in the report. No results found.  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  Clinically no evidence of progression noted. CT-Improved/CEA- trending down. However given the side effects change to FOLFIRI + Avastin starting today.   # FOLFIRI + Avastin cycle # 1- starting today.  Discussed the addition of avastin-  discussed the potential AEs- including HTN; proteunia; would healing issues etc.   # Dehydration-needing IV fluids; IV fluids as needed.   # Hypokalemia- 3.3; recommend sup at home.   # diarrhea- continue Imodium and Lomotil;   #  follow-up with me in 2 weeks/ chemo. Labs in 1 week.     [name], MD 02/17/2016 7:35 AM

## 2016-02-16 NOTE — Assessment & Plan Note (Addendum)
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  Clinically no evidence of progression noted. CT-Improved/CEA- trending down. However given the side effects change to FOLFIRI + Avastin starting today.   # FOLFIRI + Avastin cycle # 1- starting today.  Discussed the addition of avastin- discussed the potential AEs- including HTN; proteunia; would healing issues etc.   # Dehydration-needing IV fluids; IV fluids as needed.   # Hypokalemia- 3.3; recommend sup at home.   # diarrhea- continue Imodium and Lomotil;   #  follow-up with me in 2 weeks/ chemo. Labs in 1 week.

## 2016-02-17 ENCOUNTER — Telehealth: Payer: Self-pay | Admitting: *Deleted

## 2016-02-17 DIAGNOSIS — R319 Hematuria, unspecified: Principal | ICD-10-CM

## 2016-02-17 DIAGNOSIS — N390 Urinary tract infection, site not specified: Secondary | ICD-10-CM

## 2016-02-17 MED ORDER — CIPROFLOXACIN HCL 500 MG PO TABS: 500.0000 mg | ORAL_TABLET | Freq: Every day | ORAL | 0 refills | Status: DC

## 2016-02-17 NOTE — Telephone Encounter (Signed)
-----   Message from [name], MD sent at 02/17/2016  8:57 AM EDT ----- Please inform patient that urinalysis suggestive of UTI- recommend ciprofloxacin 500 mg once a day for 5 days; please prescribe cipro. Thx

## 2016-02-17 NOTE — Telephone Encounter (Signed)
Spoke with Dr. Consuello Masse asked that a urine culture be added to the urine sample collected on 8/28.  I personally contacted the [facility] main lab. She will located the specimen. As long as the specimen is not greater than 24 hrs old, this can be ordered per lab tech.  Order for urine culture entered.  This order was released and the lab tech stated that she will verify the orders.  rx for Cipro sent to patient's pharmacy.  RN contacted pt's cell left vm re: results and directions for RX-asked pt to contact cancer ctr to confirm msg was rcvd.  I also contacted pt's home phone. Personally spoke to patient. Teach back process performed with patient on instructions.  I encouraged pt to drink plenty of fluids to avoid renal side effects with the cipro.  Allergies verified with patient. - she is not allergic to cipro

## 2016-02-18 ENCOUNTER — Inpatient Hospital Stay: Payer: Medicare HMO

## 2016-02-18 DIAGNOSIS — C7A8 Other malignant neuroendocrine tumors: Secondary | ICD-10-CM

## 2016-02-18 DIAGNOSIS — C189 Malignant neoplasm of colon, unspecified: Secondary | ICD-10-CM

## 2016-02-18 DIAGNOSIS — Z5111 Encounter for antineoplastic chemotherapy: Secondary | ICD-10-CM

## 2016-02-18 DIAGNOSIS — C799 Secondary malignant neoplasm of unspecified site: Secondary | ICD-10-CM

## 2016-02-18 DIAGNOSIS — C182 Malignant neoplasm of ascending colon: Secondary | ICD-10-CM

## 2016-02-18 MED ORDER — SODIUM CHLORIDE 0.9% FLUSH
10.0000 mL | INTRAVENOUS | Status: DC | PRN
  Administered 2016-02-18: 10 mL
  Filled 2016-02-18: qty 10

## 2016-02-18 MED ORDER — HEPARIN SOD (PORK) LOCK FLUSH 100 UNIT/ML IV SOLN
500.0000 [IU] | Freq: Once | INTRAVENOUS | Status: AC | PRN
  Administered 2016-02-18: 500 [IU]

## 2016-02-18 MED ORDER — HEPARIN SOD (PORK) LOCK FLUSH 100 UNIT/ML IV SOLN
INTRAVENOUS | Status: AC
  Filled 2016-02-18: qty 5

## 2016-02-19 LAB — URINE CULTURE: Culture: >=100000 — AB

## 2016-02-25 ENCOUNTER — Inpatient Hospital Stay: Payer: Medicare HMO | Attending: Internal Medicine

## 2016-02-25 DIAGNOSIS — C7A1 Malignant poorly differentiated neuroendocrine tumors: Secondary | ICD-10-CM | POA: Insufficient documentation

## 2016-02-25 DIAGNOSIS — E669 Obesity, unspecified: Secondary | ICD-10-CM | POA: Insufficient documentation

## 2016-02-25 DIAGNOSIS — Z803 Family history of malignant neoplasm of breast: Secondary | ICD-10-CM | POA: Insufficient documentation

## 2016-02-25 DIAGNOSIS — Z5111 Encounter for antineoplastic chemotherapy: Secondary | ICD-10-CM | POA: Diagnosis not present

## 2016-02-25 DIAGNOSIS — M129 Arthropathy, unspecified: Secondary | ICD-10-CM | POA: Insufficient documentation

## 2016-02-25 DIAGNOSIS — N390 Urinary tract infection, site not specified: Secondary | ICD-10-CM | POA: Diagnosis not present

## 2016-02-25 DIAGNOSIS — E876 Hypokalemia: Secondary | ICD-10-CM | POA: Diagnosis not present

## 2016-02-25 DIAGNOSIS — C7B8 Other secondary neuroendocrine tumors: Secondary | ICD-10-CM | POA: Insufficient documentation

## 2016-02-25 DIAGNOSIS — I10 Essential (primary) hypertension: Secondary | ICD-10-CM | POA: Diagnosis not present

## 2016-02-25 DIAGNOSIS — Z79899 Other long term (current) drug therapy: Secondary | ICD-10-CM | POA: Insufficient documentation

## 2016-02-25 DIAGNOSIS — R197 Diarrhea, unspecified: Secondary | ICD-10-CM | POA: Insufficient documentation

## 2016-02-25 DIAGNOSIS — R112 Nausea with vomiting, unspecified: Secondary | ICD-10-CM | POA: Insufficient documentation

## 2016-02-25 DIAGNOSIS — D179 Benign lipomatous neoplasm, unspecified: Secondary | ICD-10-CM | POA: Insufficient documentation

## 2016-02-25 DIAGNOSIS — C182 Malignant neoplasm of ascending colon: Secondary | ICD-10-CM

## 2016-02-25 LAB — CBC WITH DIFFERENTIAL/PLATELET
Basophils Absolute: 0 10*3/uL (ref 0–0.1)
Basophils Relative: 1 %
EOS PCT: 4 %
Eosinophils Absolute: 0.2 10*3/uL (ref 0–0.7)
HEMATOCRIT: 25.6 % — AB (ref 35.0–47.0)
Hemoglobin: 8.7 g/dL — ABNORMAL LOW (ref 12.0–16.0)
LYMPHS ABS: 1.2 10*3/uL (ref 1.0–3.6)
LYMPHS PCT: 30 %
MCH: 33.3 pg (ref 26.0–34.0)
MCHC: 34.2 g/dL (ref 32.0–36.0)
MCV: 97.5 fL (ref 80.0–100.0)
MONO ABS: 0.1 10*3/uL — AB (ref 0.2–0.9)
MONOS PCT: 4 %
NEUTROS ABS: 2.5 10*3/uL (ref 1.4–6.5)
Neutrophils Relative %: 61 %
PLATELETS: 164 10*3/uL (ref 150–440)
RBC: 2.62 MIL/uL — ABNORMAL LOW (ref 3.80–5.20)
RDW: 21.9 % — AB (ref 11.5–14.5)
WBC: 4 10*3/uL (ref 3.6–11.0)

## 2016-02-25 LAB — BASIC METABOLIC PANEL
Anion gap: 5 (ref 5–15)
BUN: 18 mg/dL (ref 6–20)
CALCIUM: 8.9 mg/dL (ref 8.9–10.3)
CO2: 26 mmol/L (ref 22–32)
Chloride: 103 mmol/L (ref 101–111)
Creatinine, Ser: 0.85 mg/dL (ref 0.44–1.00)
GFR calc Af Amer: >60 mL/min (ref >60)
GLUCOSE: 95 mg/dL (ref 65–99)
Potassium: 4 mmol/L (ref 3.5–5.1)
Sodium: 134 mmol/L — ABNORMAL LOW (ref 135–145)

## 2016-02-26 DIAGNOSIS — C7A8 Other malignant neuroendocrine tumors: Secondary | ICD-10-CM | POA: Diagnosis not present

## 2016-02-26 DIAGNOSIS — C799 Secondary malignant neoplasm of unspecified site: Secondary | ICD-10-CM | POA: Diagnosis not present

## 2016-03-01 ENCOUNTER — Inpatient Hospital Stay (HOSPITAL_BASED_OUTPATIENT_CLINIC_OR_DEPARTMENT_OTHER): Payer: Medicare HMO | Admitting: Internal Medicine

## 2016-03-01 ENCOUNTER — Encounter: Payer: Self-pay | Admitting: Internal Medicine

## 2016-03-01 ENCOUNTER — Inpatient Hospital Stay: Payer: Medicare HMO

## 2016-03-01 VITALS — BP 145/94 | HR 84 | Temp 95.5°F | Resp 18 | Ht 64.0 in | Wt 190.2 lb

## 2016-03-01 DIAGNOSIS — C7A8 Other malignant neuroendocrine tumors: Secondary | ICD-10-CM | POA: Diagnosis not present

## 2016-03-01 DIAGNOSIS — C182 Malignant neoplasm of ascending colon: Secondary | ICD-10-CM

## 2016-03-01 DIAGNOSIS — C189 Malignant neoplasm of colon, unspecified: Secondary | ICD-10-CM

## 2016-03-01 DIAGNOSIS — Z803 Family history of malignant neoplasm of breast: Secondary | ICD-10-CM

## 2016-03-01 DIAGNOSIS — R197 Diarrhea, unspecified: Secondary | ICD-10-CM

## 2016-03-01 DIAGNOSIS — Z5111 Encounter for antineoplastic chemotherapy: Secondary | ICD-10-CM

## 2016-03-01 DIAGNOSIS — C7B8 Other secondary neuroendocrine tumors: Secondary | ICD-10-CM

## 2016-03-01 DIAGNOSIS — M129 Arthropathy, unspecified: Secondary | ICD-10-CM

## 2016-03-01 DIAGNOSIS — I10 Essential (primary) hypertension: Secondary | ICD-10-CM

## 2016-03-01 DIAGNOSIS — Z79899 Other long term (current) drug therapy: Secondary | ICD-10-CM

## 2016-03-01 DIAGNOSIS — E669 Obesity, unspecified: Secondary | ICD-10-CM

## 2016-03-01 DIAGNOSIS — C7A1 Malignant poorly differentiated neuroendocrine tumors: Secondary | ICD-10-CM | POA: Diagnosis not present

## 2016-03-01 DIAGNOSIS — N390 Urinary tract infection, site not specified: Secondary | ICD-10-CM

## 2016-03-01 DIAGNOSIS — C799 Secondary malignant neoplasm of unspecified site: Secondary | ICD-10-CM

## 2016-03-01 DIAGNOSIS — D179 Benign lipomatous neoplasm, unspecified: Secondary | ICD-10-CM

## 2016-03-01 DIAGNOSIS — E876 Hypokalemia: Secondary | ICD-10-CM

## 2016-03-01 DIAGNOSIS — R112 Nausea with vomiting, unspecified: Secondary | ICD-10-CM

## 2016-03-01 LAB — CBC WITH DIFFERENTIAL/PLATELET
BASOS ABS: 0 10*3/uL (ref 0–0.1)
BASOS PCT: 1 %
Eosinophils Absolute: 0.1 10*3/uL (ref 0–0.7)
Eosinophils Relative: 2 %
HEMATOCRIT: 27.3 % — AB (ref 35.0–47.0)
HEMOGLOBIN: 9.5 g/dL — AB (ref 12.0–16.0)
LYMPHS PCT: 38 %
Lymphs Abs: 1.3 10*3/uL (ref 1.0–3.6)
MCH: 34 pg (ref 26.0–34.0)
MCHC: 34.7 g/dL (ref 32.0–36.0)
MCV: 98 fL (ref 80.0–100.0)
Monocytes Absolute: 0.3 10*3/uL (ref 0.2–0.9)
Monocytes Relative: 10 %
NEUTROS ABS: 1.6 10*3/uL (ref 1.4–6.5)
NEUTROS PCT: 49 %
Platelets: 214 10*3/uL (ref 150–440)
RBC: 2.79 MIL/uL — AB (ref 3.80–5.20)
RDW: 22.8 % — ABNORMAL HIGH (ref 11.5–14.5)
WBC: 3.3 10*3/uL — ABNORMAL LOW (ref 3.6–11.0)

## 2016-03-01 LAB — URINALYSIS COMPLETE WITH MICROSCOPIC (ARMC ONLY)
BACTERIA UA: NONE SEEN
Bilirubin Urine: NEGATIVE
Glucose, UA: NEGATIVE mg/dL
Ketones, ur: NEGATIVE mg/dL
Nitrite: NEGATIVE
PH: 5 (ref 5.0–8.0)
PROTEIN: NEGATIVE mg/dL
Specific Gravity, Urine: 1.011 (ref 1.005–1.030)

## 2016-03-01 LAB — COMPREHENSIVE METABOLIC PANEL
ALBUMIN: 3.7 g/dL (ref 3.5–5.0)
ALK PHOS: 83 U/L (ref 38–126)
ALT: 35 U/L (ref 14–54)
AST: 32 U/L (ref 15–41)
Anion gap: 5 (ref 5–15)
BILIRUBIN TOTAL: 0.3 mg/dL (ref 0.3–1.2)
BUN: 10 mg/dL (ref 6–20)
CALCIUM: 8.9 mg/dL (ref 8.9–10.3)
CO2: 21 mmol/L — ABNORMAL LOW (ref 22–32)
CREATININE: 0.8 mg/dL (ref 0.44–1.00)
Chloride: 108 mmol/L (ref 101–111)
GFR calc Af Amer: >60 mL/min (ref >60)
GLUCOSE: 108 mg/dL — AB (ref 65–99)
POTASSIUM: 3.8 mmol/L (ref 3.5–5.1)
Sodium: 134 mmol/L — ABNORMAL LOW (ref 135–145)
TOTAL PROTEIN: 7.8 g/dL (ref 6.5–8.1)

## 2016-03-01 MED ORDER — SODIUM CHLORIDE 0.9 % IV SOLN
10.0000 mg | Freq: Once | INTRAVENOUS | Status: AC
  Administered 2016-03-01: 10 mg via INTRAVENOUS
  Filled 2016-03-01: qty 1

## 2016-03-01 MED ORDER — PALONOSETRON HCL INJECTION 0.25 MG/5ML
0.2500 mg | Freq: Once | INTRAVENOUS | Status: AC
  Administered 2016-03-01: 0.25 mg via INTRAVENOUS
  Filled 2016-03-01: qty 5

## 2016-03-01 MED ORDER — HEPARIN SOD (PORK) LOCK FLUSH 100 UNIT/ML IV SOLN: 500.0000 [IU] | Freq: Once | INTRAVENOUS | Status: DC

## 2016-03-01 MED ORDER — IRINOTECAN HCL CHEMO INJECTION 100 MG/5ML
180.0000 mg/m2 | Freq: Once | INTRAVENOUS | Status: AC
  Administered 2016-03-01: 360 mg via INTRAVENOUS
  Filled 2016-03-01: qty 15

## 2016-03-01 MED ORDER — ATROPINE SULFATE 1 MG/ML IJ SOLN
0.5000 mg | Freq: Once | INTRAMUSCULAR | Status: AC | PRN
  Administered 2016-03-01: 0.5 mg via INTRAVENOUS
  Filled 2016-03-01: qty 1

## 2016-03-01 MED ORDER — SODIUM CHLORIDE 0.9 % IV SOLN
400.0000 mg | Freq: Once | INTRAVENOUS | Status: AC
  Administered 2016-03-01: 400 mg via INTRAVENOUS
  Filled 2016-03-01: qty 16

## 2016-03-01 MED ORDER — LEUCOVORIN CALCIUM INJECTION 350 MG
800.0000 mg | Freq: Once | INTRAVENOUS | Status: AC
  Administered 2016-03-01: 800 mg via INTRAVENOUS
  Filled 2016-03-01: qty 40

## 2016-03-01 MED ORDER — SODIUM CHLORIDE 0.9 % IV SOLN
Freq: Once | INTRAVENOUS | Status: AC
  Administered 2016-03-01: 11:00:00 via INTRAVENOUS
  Filled 2016-03-01: qty 1000

## 2016-03-01 MED ORDER — SODIUM CHLORIDE 0.9% FLUSH
10.0000 mL | Freq: Once | INTRAVENOUS | Status: AC
  Administered 2016-03-01: 10 mL via INTRAVENOUS
  Filled 2016-03-01: qty 10

## 2016-03-01 MED ORDER — FLUOROURACIL CHEMO INJECTION 5 GM/100ML
2400.0000 mg/m2 | INTRAVENOUS | Status: DC
  Administered 2016-03-01: 4750 mg via INTRAVENOUS
  Filled 2016-03-01: qty 95

## 2016-03-01 NOTE — Progress Notes (Signed)
Pt reports she has been feeling good.

## 2016-03-01 NOTE — Assessment & Plan Note (Addendum)
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  Clinically no evidence of progression noted.July 14th CT-Improved/CEA- trending down.   # proceed with FOLFIRI + Avastin cycle # 2  today.  Will order CT at next visit.  Add CEA to labs today.   # Hypokalemia- 3.8; recommend sup at home k dur BID.   # UTI- s/p Anbx; recheck UA today.   # diarrhea-G-1 continue Imodium and Lomotil;   #  follow-up with me in 2 weeks/ chemo. Labs in 1 week.

## 2016-03-01 NOTE — Progress Notes (Signed)
[place] NOTE  [name], [name], [name], [name], [name]; [name]; [name]; [name]; [name]; [name]; [name]; [name]; July 14th  2017 CT- PR  # AUG 28th- FOLFIRI +Avastin   # right kidney lesion 2.6 x 1.6 cm ? 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  Patient's fatigue is improved. Diarrhea improved. No cramping in the legs no tingling and numbness.  Intermittent nausea and vomiting. Improved with her antiemetics. Denies any skin rash. No fevers or chills. No sores in the mouth. No chest pain or shortness of the cough.  ROS: A complete 10 point review of system is done which is negative except mentioned above in history of present illness  MEDICAL HISTORY:  Past Medical History:  Diagnosis Date  . Angiolipoma of kidney    followed with serial CTs ,,  Dr. Jacqlyn Larsen  . Arthritis   . Cancer ([place])    colon  . Chemotherapy induced diarrhea   . Hammer toe   . Hypertension   . Neuro-endocrine carcinoma  ([place])   . Obesity (BMI 30-39.9)     SURGICAL HISTORY: Past Surgical History:  Procedure Laterality Date  . ABDOMINAL HYSTERECTOMY  1995   endometriosis, heavy bleeding  . BUNIONECTOMY Bilateral   . CESAREAN SECTION  1985  . COLONOSCOPY WITH PROPOFOL N/A 10/09/2015   Procedure: COLONOSCOPY WITH PROPOFOL;  Surgeon: Robert Bellow, MD;  Location: [facility] ENDOSCOPY;  Service: Endoscopy;  Laterality: N/A;  . OOPHORECTOMY    . PORTACATH PLACEMENT Left 10/01/2015   Procedure: INSERTION PORT-A-CATH;  Surgeon: Robert Bellow, MD;  Location: [facility] ORS;  Service: General;  Laterality: Left;  . ROTATOR CUFF REPAIR Right 2008   right shoulder.  [place]     SOCIAL HISTORY: She lives at home with her family in [place]. 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 Social History   Social History  . Marital status: Married    Spouse name: N/A  . Number of children: N/A  . Years of education: N/A   Occupational History  . Not on file.   Social History Main Topics  . Smoking status: Never Smoker  . Smokeless tobacco: Never Used  . Alcohol use 0.0 oz/week     Comment: occasionally  . Drug use: No  . Sexual activity: Not Currently   Other Topics Concern  . Not on file   Social History Narrative  . No narrative on file    FAMILY HISTORY: Family  History  Problem Relation Age of Onset  . Mental illness Mother 71    bipolar, dementia  . Cancer Maternal Aunt 70    breast ca  . Hypertension Father   . Stroke Father 42    deceased  . COPD Father   . Heart disease Sister     deceased    ALLERGIES:  is allergic to bee venom and morphine and related.  MEDICATIONS:  Current Outpatient Prescriptions  Medication Sig Dispense Refill  . ALPRAZolam (XANAX) 0.25 MG tablet Take 1 tablet (0.25 mg total) by mouth daily as needed for sleep or anxiety. 90 tablet 5  . amLODipine (NORVASC) 5 MG tablet Take 1 tablet (5 mg total) by mouth daily. 30 tablet 3   . diphenoxylate-atropine (LOMOTIL) 2.5-0.025 MG tablet Take 1 tablet by mouth 4 (four) times daily as needed for diarrhea or loose stools. 30 tablet 6  . lidocaine-prilocaine (EMLA) cream Apply 1 application topically as needed. Apply to port a cath site 1 hour prior to chemotherapy treatments 30 g 6  . olmesartan (BENICAR) 40 MG tablet Take 1 tablet (40 mg total) by mouth daily. 30 tablet 2  . ondansetron (ZOFRAN) 8 MG tablet Take 1 tablet (8 mg total) by mouth every 6 (six) hours as needed for nausea or vomiting. 30 tablet 3  . potassium chloride (K-DUR) 10 MEQ tablet Take 1 tablet (10 mEq total) by mouth 2 (two) times daily. 60 tablet 3  . prochlorperazine (COMPAZINE) 10 MG tablet Take 1 tablet (10 mg total) by mouth every 6 (six) hours as needed for nausea or vomiting. 30 tablet 6   No current facility-administered medications for this visit.    Facility-Administered Medications Ordered in Other Visits  Medication Dose Route Frequency Provider Last Rate Last Dose  . fluorouracil (ADRUCIL) 4,750 mg in sodium chloride 0.9 % 55 mL chemo infusion  2,400 mg/m2 (Treatment Plan Recorded) Intravenous 1 day or 1 dose [name], MD   4,750 mg at 03/01/16 1400  . heparin lock flush 100 unit/mL  500 Units Intravenous Once [name], MD      . heparin lock flush 100 unit/mL  500 Units Intravenous Once [name], MD      . sodium chloride flush (NS) 0.9 % injection 10 mL  10 mL Intravenous PRN [name], MD   10 mL at 11/24/15 0836      .  PHYSICAL EXAMINATION: ECOG PERFORMANCE STATUS: 0 - Asymptomatic  Vitals:   03/01/16 0923  BP: (!) 145/94  Pulse: 84  Resp: 18  Temp: (!) 95.5 F (35.3 C)   Filed Weights   03/01/16 0923  Weight: 190 lb 3.2 oz (86.3 kg)    GENERAL: Well-nourished well-developed; Alert, no distress and comfortable. She is alone.   EYES: no pallor or icterus OROPHARYNX: no thrush or ulceration; good dentition  NECK: supple, no  masses felt LYMPH:  no palpable lymphadenopathy in the cervical, axillary or inguinal regions LUNGS: clear to auscultation and  No wheeze or crackles HEART/CVS: regular rate & rhythm and no murmurs; No lower extremity edema ABDOMEN: abdomen soft, non-tender and normal bowel sounds; positive for hepatomegaly. Musculoskeletal:no cyanosis of digits and no clubbing  PSYCH: alert & oriented x 3 with fluent speech NEURO: no focal motor/sensory deficits SKIN:  no rashes or significant lesions; Mediport is in place. Positive for alopecia.  LABORATORY DATA:  I have reviewed the data as listed Lab Results  Component Value Date  WBC 3.3 (L) 03/01/2016   HGB 9.5 (L) 03/01/2016   HCT 27.3 (L) 03/01/2016   MCV 98.0 03/01/2016   PLT 214 03/01/2016    Recent Labs  02/02/16 0903  02/16/16 0850 02/25/16 1031 03/01/16 0900  NA 135  < > 136 134* 134*  K 3.7  < > 3.3* 4.0 3.8  CL 106  < > 106 103 108  CO2 22  < > 22 26 21*  GLUCOSE 104*  < > 101* 95 108*  BUN 16  < > 14 18 10   CREATININE 1.06*  < > 0.93 0.85 0.80  CALCIUM 8.5*  < > 8.8* 8.9 8.9  GFRNONAA 54*  < > >60 >60 >60  GFRAA >60  < > >60 >60 >60  PROT 7.7  --  7.7  --  7.8  ALBUMIN 2.9*  --  3.5  --  3.7  AST 30  --  31  --  32  ALT 28  --  26  --  35  ALKPHOS 119  --  111  --  83  BILITOT 0.4  --  0.5  --  0.3  < > = values in this interval not displayed.  RADIOGRAPHIC STUDIES: I have personally reviewed the radiological images as listed and agreed with the findings in the report. No results found.  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  Clinically no evidence of progression noted.July 14th CT-Improved/CEA- trending down.   # proceed with FOLFIRI + Avastin cycle # 2  today.  Will order CT at next visit.  Add CEA to labs today.   # Hypokalemia- 3.8; recommend sup at home k dur BID.   #  UTI- s/p Anbx; recheck UA today.   # diarrhea-G-1 continue Imodium and Lomotil;   #  follow-up with me in 2 weeks/ chemo. Labs in 1 week.     [name], MD 03/01/2016 3:21 PM

## 2016-03-02 LAB — CEA: CEA: 173.6 ng/mL — AB (ref 0.0–4.7)

## 2016-03-03 ENCOUNTER — Other Ambulatory Visit: Payer: Self-pay | Admitting: Internal Medicine

## 2016-03-03 ENCOUNTER — Inpatient Hospital Stay: Payer: Medicare HMO

## 2016-03-03 DIAGNOSIS — C7A8 Other malignant neuroendocrine tumors: Secondary | ICD-10-CM

## 2016-03-03 DIAGNOSIS — Z5111 Encounter for antineoplastic chemotherapy: Secondary | ICD-10-CM | POA: Diagnosis not present

## 2016-03-03 DIAGNOSIS — C799 Secondary malignant neoplasm of unspecified site: Secondary | ICD-10-CM

## 2016-03-03 DIAGNOSIS — C182 Malignant neoplasm of ascending colon: Secondary | ICD-10-CM

## 2016-03-03 DIAGNOSIS — C189 Malignant neoplasm of colon, unspecified: Secondary | ICD-10-CM

## 2016-03-03 MED ORDER — SODIUM CHLORIDE 0.9 % IJ SOLN
10.0000 mL | Freq: Once | INTRAMUSCULAR | Status: AC
Start: 2016-03-03 — End: 2016-03-03
  Administered 2016-03-03: 10 mL via INTRAVENOUS
  Filled 2016-03-03: qty 10

## 2016-03-03 MED ORDER — HEPARIN SOD (PORK) LOCK FLUSH 100 UNIT/ML IV SOLN
500.0000 [IU] | Freq: Once | INTRAVENOUS | Status: AC
  Administered 2016-03-03: 500 [IU] via INTRAVENOUS
  Filled 2016-03-03: qty 5

## 2016-03-03 NOTE — Telephone Encounter (Signed)
Refilled medication

## 2016-03-08 ENCOUNTER — Other Ambulatory Visit: Payer: Self-pay

## 2016-03-08 ENCOUNTER — Inpatient Hospital Stay: Payer: Medicare HMO

## 2016-03-08 DIAGNOSIS — Z5111 Encounter for antineoplastic chemotherapy: Secondary | ICD-10-CM | POA: Diagnosis not present

## 2016-03-08 DIAGNOSIS — C182 Malignant neoplasm of ascending colon: Secondary | ICD-10-CM

## 2016-03-08 DIAGNOSIS — C7A8 Other malignant neuroendocrine tumors: Secondary | ICD-10-CM

## 2016-03-08 LAB — BASIC METABOLIC PANEL
Anion gap: 5 (ref 5–15)
BUN: 11 mg/dL (ref 6–20)
CHLORIDE: 103 mmol/L (ref 101–111)
CO2: 26 mmol/L (ref 22–32)
CREATININE: 0.93 mg/dL (ref 0.44–1.00)
Calcium: 9 mg/dL (ref 8.9–10.3)
GFR calc non Af Amer: >60 mL/min (ref >60)
Glucose, Bld: 87 mg/dL (ref 65–99)
POTASSIUM: 4.3 mmol/L (ref 3.5–5.1)
SODIUM: 134 mmol/L — AB (ref 135–145)

## 2016-03-08 LAB — SAMPLE TO BLOOD BANK

## 2016-03-08 LAB — CBC WITH DIFFERENTIAL/PLATELET
Basophils Absolute: 0 10*3/uL (ref 0–0.1)
Basophils Relative: 1 %
EOS ABS: 0.1 10*3/uL (ref 0–0.7)
Eosinophils Relative: 3 %
HEMATOCRIT: 29.4 % — AB (ref 35.0–47.0)
HEMOGLOBIN: 10 g/dL — AB (ref 12.0–16.0)
LYMPHS ABS: 1.2 10*3/uL (ref 1.0–3.6)
Lymphocytes Relative: 41 %
MCH: 33.4 pg (ref 26.0–34.0)
MCHC: 34.1 g/dL (ref 32.0–36.0)
MCV: 97.8 fL (ref 80.0–100.0)
MONOS PCT: 5 %
Monocytes Absolute: 0.1 10*3/uL — ABNORMAL LOW (ref 0.2–0.9)
NEUTROS ABS: 1.4 10*3/uL (ref 1.4–6.5)
NEUTROS PCT: 50 %
Platelets: 263 10*3/uL (ref 150–440)
RBC: 3.01 MIL/uL — AB (ref 3.80–5.20)
RDW: 20.9 % — ABNORMAL HIGH (ref 11.5–14.5)
WBC: 2.8 10*3/uL — AB (ref 3.6–11.0)

## 2016-03-08 LAB — MAGNESIUM: Magnesium: 2 mg/dL (ref 1.7–2.4)

## 2016-03-15 ENCOUNTER — Inpatient Hospital Stay: Payer: Medicare HMO

## 2016-03-15 ENCOUNTER — Inpatient Hospital Stay (HOSPITAL_BASED_OUTPATIENT_CLINIC_OR_DEPARTMENT_OTHER): Payer: Medicare HMO | Admitting: Internal Medicine

## 2016-03-15 VITALS — BP 151/90 | HR 84 | Temp 96.6°F | Resp 18 | Ht 64.0 in | Wt 190.0 lb

## 2016-03-15 VITALS — BP 135/93 | HR 102 | Resp 20

## 2016-03-15 DIAGNOSIS — C7B8 Other secondary neuroendocrine tumors: Secondary | ICD-10-CM | POA: Diagnosis not present

## 2016-03-15 DIAGNOSIS — C799 Secondary malignant neoplasm of unspecified site: Secondary | ICD-10-CM

## 2016-03-15 DIAGNOSIS — C7A8 Other malignant neuroendocrine tumors: Secondary | ICD-10-CM

## 2016-03-15 DIAGNOSIS — C7A1 Malignant poorly differentiated neuroendocrine tumors: Secondary | ICD-10-CM

## 2016-03-15 DIAGNOSIS — E876 Hypokalemia: Secondary | ICD-10-CM

## 2016-03-15 DIAGNOSIS — C182 Malignant neoplasm of ascending colon: Secondary | ICD-10-CM

## 2016-03-15 DIAGNOSIS — Z5111 Encounter for antineoplastic chemotherapy: Secondary | ICD-10-CM

## 2016-03-15 DIAGNOSIS — N390 Urinary tract infection, site not specified: Secondary | ICD-10-CM | POA: Diagnosis not present

## 2016-03-15 DIAGNOSIS — I10 Essential (primary) hypertension: Secondary | ICD-10-CM

## 2016-03-15 DIAGNOSIS — R112 Nausea with vomiting, unspecified: Secondary | ICD-10-CM

## 2016-03-15 DIAGNOSIS — R197 Diarrhea, unspecified: Secondary | ICD-10-CM

## 2016-03-15 DIAGNOSIS — Z803 Family history of malignant neoplasm of breast: Secondary | ICD-10-CM

## 2016-03-15 DIAGNOSIS — C189 Malignant neoplasm of colon, unspecified: Secondary | ICD-10-CM

## 2016-03-15 DIAGNOSIS — D179 Benign lipomatous neoplasm, unspecified: Secondary | ICD-10-CM

## 2016-03-15 DIAGNOSIS — Z79899 Other long term (current) drug therapy: Secondary | ICD-10-CM

## 2016-03-15 DIAGNOSIS — M129 Arthropathy, unspecified: Secondary | ICD-10-CM

## 2016-03-15 DIAGNOSIS — E669 Obesity, unspecified: Secondary | ICD-10-CM

## 2016-03-15 LAB — URINALYSIS COMPLETE WITH MICROSCOPIC (ARMC ONLY)
BILIRUBIN URINE: NEGATIVE
Bacteria, UA: NONE SEEN
KETONES UR: NEGATIVE mg/dL
LEUKOCYTES UA: NEGATIVE
Nitrite: NEGATIVE
PH: 5 (ref 5.0–8.0)
Protein, ur: NEGATIVE mg/dL
Specific Gravity, Urine: 1.007 (ref 1.005–1.030)

## 2016-03-15 LAB — COMPREHENSIVE METABOLIC PANEL
ALBUMIN: 3.8 g/dL (ref 3.5–5.0)
ALK PHOS: 84 U/L (ref 38–126)
ALT: 24 U/L (ref 14–54)
AST: 26 U/L (ref 15–41)
Anion gap: 5 (ref 5–15)
BILIRUBIN TOTAL: 0.4 mg/dL (ref 0.3–1.2)
BUN: 15 mg/dL (ref 6–20)
CALCIUM: 9 mg/dL (ref 8.9–10.3)
CO2: 23 mmol/L (ref 22–32)
CREATININE: 0.93 mg/dL (ref 0.44–1.00)
Chloride: 108 mmol/L (ref 101–111)
GFR calc Af Amer: >60 mL/min (ref >60)
GFR calc non Af Amer: >60 mL/min (ref >60)
GLUCOSE: 90 mg/dL (ref 65–99)
Potassium: 3.9 mmol/L (ref 3.5–5.1)
Sodium: 136 mmol/L (ref 135–145)
TOTAL PROTEIN: 8 g/dL (ref 6.5–8.1)

## 2016-03-15 LAB — CBC WITH DIFFERENTIAL/PLATELET
BASOS ABS: 0.1 10*3/uL (ref 0–0.1)
BASOS PCT: 2 %
Eosinophils Absolute: 0.1 10*3/uL (ref 0–0.7)
Eosinophils Relative: 2 %
HEMATOCRIT: 29.2 % — AB (ref 35.0–47.0)
HEMOGLOBIN: 10.1 g/dL — AB (ref 12.0–16.0)
Lymphocytes Relative: 30 %
Lymphs Abs: 1.1 10*3/uL (ref 1.0–3.6)
MCH: 33.5 pg (ref 26.0–34.0)
MCHC: 34.5 g/dL (ref 32.0–36.0)
MCV: 97 fL (ref 80.0–100.0)
MONOS PCT: 10 %
Monocytes Absolute: 0.4 10*3/uL (ref 0.2–0.9)
NEUTROS ABS: 2 10*3/uL (ref 1.4–6.5)
NEUTROS PCT: 56 %
Platelets: 227 10*3/uL (ref 150–440)
RBC: 3.01 MIL/uL — AB (ref 3.80–5.20)
RDW: 21.1 % — ABNORMAL HIGH (ref 11.5–14.5)
WBC: 3.7 10*3/uL (ref 3.6–11.0)

## 2016-03-15 LAB — SAMPLE TO BLOOD BANK

## 2016-03-15 LAB — MAGNESIUM: Magnesium: 1.9 mg/dL (ref 1.7–2.4)

## 2016-03-15 MED ORDER — BEVACIZUMAB CHEMO INJECTION 400 MG/16ML
400.0000 mg | Freq: Once | INTRAVENOUS | Status: AC
  Administered 2016-03-15: 400 mg via INTRAVENOUS
  Filled 2016-03-15: qty 16

## 2016-03-15 MED ORDER — IRINOTECAN HCL CHEMO INJECTION 100 MG/5ML
180.0000 mg/m2 | Freq: Once | INTRAVENOUS | Status: AC
  Administered 2016-03-15: 360 mg via INTRAVENOUS
  Filled 2016-03-15: qty 15

## 2016-03-15 MED ORDER — ATROPINE SULFATE 1 MG/ML IJ SOLN
0.5000 mg | Freq: Once | INTRAMUSCULAR | Status: AC | PRN
  Administered 2016-03-15: 0.5 mg via INTRAVENOUS
  Filled 2016-03-15: qty 1

## 2016-03-15 MED ORDER — SODIUM CHLORIDE 0.9 % IV SOLN
2400.0000 mg/m2 | INTRAVENOUS | Status: DC
  Administered 2016-03-15: 4750 mg via INTRAVENOUS
  Filled 2016-03-15: qty 95

## 2016-03-15 MED ORDER — LEUCOVORIN CALCIUM INJECTION 350 MG
800.0000 mg | Freq: Once | INTRAVENOUS | Status: AC
  Administered 2016-03-15: 800 mg via INTRAVENOUS
  Filled 2016-03-15: qty 25

## 2016-03-15 MED ORDER — SODIUM CHLORIDE 0.9 % IV SOLN
Freq: Once | INTRAVENOUS | Status: AC
  Administered 2016-03-15: 10:00:00 via INTRAVENOUS
  Filled 2016-03-15: qty 1000

## 2016-03-15 MED ORDER — SODIUM CHLORIDE 0.9% FLUSH
10.0000 mL | Freq: Once | INTRAVENOUS | Status: AC
  Filled 2016-03-15: qty 10

## 2016-03-15 MED ORDER — PALONOSETRON HCL INJECTION 0.25 MG/5ML
0.2500 mg | Freq: Once | INTRAVENOUS | Status: AC
  Administered 2016-03-15: 0.25 mg via INTRAVENOUS
  Filled 2016-03-15: qty 5

## 2016-03-15 MED ORDER — SODIUM CHLORIDE 0.9 % IV SOLN
10.0000 mg | Freq: Once | INTRAVENOUS | Status: AC
  Administered 2016-03-15: 10 mg via INTRAVENOUS
  Filled 2016-03-15: qty 1

## 2016-03-15 NOTE — Progress Notes (Signed)
[place] NOTE  [name], [name], [name], [name], [name]; [name]; [name]; [name]; [name]; [name]; [name]; [name]; July 14th  2017 CT- PR  # AUG 28th- FOLFIRI +Avastin   # right kidney lesion 2.6 x 1.6 cm ? 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  Patient continues to have intermittent nausea with vomiting; few days after chemotherapy. Diarrhea improved with Lomotil and Imodium. Otherwise no weight loss.  Patient's fatigue is improved. Diarrhea improved. No cramping in the legs no tingling and numbness. Denies any skin rash. No fevers or chills. No sores in the mouth. No chest pain or shortness of the cough.  ROS: A complete 10 point review of system is done which is negative except mentioned above in history of present illness  MEDICAL HISTORY:  Past Medical History:  Diagnosis Date  . Angiolipoma of kidney    followed with serial CTs ,,  Dr. Jacqlyn Larsen  . Arthritis   . Cancer ([place])    colon  .  Chemotherapy induced diarrhea   . Hammer toe   . Hypertension   . Neuro-endocrine carcinoma ([place])   . Obesity (BMI 30-39.9)     SURGICAL HISTORY: Past Surgical History:  Procedure Laterality Date  . ABDOMINAL HYSTERECTOMY  1995   endometriosis, heavy bleeding  . BUNIONECTOMY Bilateral   . CESAREAN SECTION  1985  . COLONOSCOPY WITH PROPOFOL N/A 10/09/2015   Procedure: COLONOSCOPY WITH PROPOFOL;  Surgeon: Robert Bellow, MD;  Location[facility] ENDOSCOPY;  Service: Endoscopy;  Laterality: N/A;  . OOPHORECTOMY    . PORTACATH PLACEMENT Left 10/01/2015   Procedure: INSERTION PORT-A-CATH;  Surgeon: Robert Bellow, MD;  Location: [facility] ORS;  Service: General;  Laterality: Left;  . ROTATOR CUFF REPAIR Right 2008   right shoulder.  [place]     SOCIAL HISTORY: She lives at home with her family in [place]. 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 Social History   Social History  . Marital status: Married    Spouse name: N/A  . Number of children: N/A  . Years of education: N/A   Occupational History  . Not on file.   Social History Main Topics  . Smoking status: Never Smoker  . Smokeless tobacco: Never Used  . Alcohol use 0.0 oz/week     Comment: occasionally  . Drug use: No  . Sexual activity: Not Currently   Other Topics Concern  . Not on file   Social  History Narrative  . No narrative on file    FAMILY HISTORY: Family History  Problem Relation Age of Onset  . Mental illness Mother 66    bipolar, dementia  . Cancer Maternal Aunt 70    breast ca  . Hypertension Father   . Stroke Father 65    deceased  . COPD Father   . Heart disease Sister     deceased    ALLERGIES:  is allergic to bee venom and morphine and related.  MEDICATIONS:  Current Outpatient Prescriptions  Medication Sig Dispense Refill  . ondansetron (ZOFRAN) 8 MG tablet Take 1 tablet (8 mg total) by mouth every 6 (six) hours as needed for nausea or  vomiting. 30 tablet 3  . prochlorperazine (COMPAZINE) 10 MG tablet Take 1 tablet (10 mg total) by mouth every 6 (six) hours as needed for nausea or vomiting. 30 tablet 6  . ALPRAZolam (XANAX) 0.25 MG tablet Take 1 tablet (0.25 mg total) by mouth daily as needed for sleep or anxiety. 90 tablet 5  . amLODipine (NORVASC) 5 MG tablet TAKE ONE TABLET BY MOUTH DAILY 30 tablet 3  . diphenoxylate-atropine (LOMOTIL) 2.5-0.025 MG tablet Take 1 tablet by mouth 4 (four) times daily as needed for diarrhea or loose stools. 30 tablet 6  . lidocaine-prilocaine (EMLA) cream Apply 1 application topically as needed. Apply to port a cath site 1 hour prior to chemotherapy treatments 30 g 6  . olmesartan (BENICAR) 40 MG tablet Take 1 tablet (40 mg total) by mouth daily. 30 tablet 2  . olmesartan-hydrochlorothiazide (BENICAR HCT) 40-12.5 MG tablet TAKE ONE TABLET BY MOUTH EVERY DAY 30 tablet 3  . potassium chloride (K-DUR) 10 MEQ tablet Take 1 tablet (10 mEq total) by mouth 2 (two) times daily. 60 tablet 3   No current facility-administered medications for this visit.    Facility-Administered Medications Ordered in Other Visits  Medication Dose Route Frequency Provider Last Rate Last Dose  . heparin lock flush 100 unit/mL  500 Units Intravenous Once [name], MD      . sodium chloride flush (NS) 0.9 % injection 10 mL  10 mL Intravenous PRN [name], MD   10 mL at 11/24/15 0836      .  PHYSICAL EXAMINATION: ECOG PERFORMANCE STATUS: 0 - Asymptomatic  Vitals:   03/15/16 0915  BP: (!) 151/90  Pulse: 84  Resp: 18  Temp: (!) 96.6 F (35.9 C)   Filed Weights   03/15/16 0919  Weight: 190 lb (86.2 kg)    GENERAL: Well-nourished well-developed; Alert, no distress and comfortable. She is alone.   EYES: no pallor or icterus OROPHARYNX: no thrush or ulceration; good dentition  NECK: supple, no masses felt LYMPH:  no palpable lymphadenopathy in the cervical, axillary or inguinal  regions LUNGS: clear to auscultation and  No wheeze or crackles HEART/CVS: regular rate & rhythm and no murmurs; No lower extremity edema ABDOMEN: abdomen soft, non-tender and normal bowel sounds; positive for hepatomegaly. Musculoskeletal:no cyanosis of digits and no clubbing  PSYCH: alert & oriented x 3 with fluent speech NEURO: no focal motor/sensory deficits SKIN:  no rashes or significant lesions; Mediport is in place. Positive for alopecia.  LABORATORY DATA:  I have reviewed the data as listed Lab Results  Component Value Date   WBC 3.7 03/15/2016   HGB 10.1 (L) 03/15/2016   HCT 29.2 (L) 03/15/2016   MCV 97.0 03/15/2016   PLT 227 03/15/2016    Recent Labs  02/16/16 0850  03/01/16 0900 03/08/16 0949 03/15/16 0854  NA 136  < > 134* 134* 136  K 3.3*  < > 3.8 4.3 3.9  CL 106  < > 108 103 108  CO2 22  < > 21* 26 23  GLUCOSE 101*  < > 108* 87 90  BUN 14  < > 10 11 15   CREATININE 0.93  < > 0.80 0.93 0.93  CALCIUM 8.8*  < > 8.9 9.0 9.0  GFRNONAA >60  < > >60 >60 >60  GFRAA >60  < > >60 >60 >60  PROT 7.7  --  7.8  --  8.0  ALBUMIN 3.5  --  3.7  --  3.8  AST 31  --  32  --  26  ALT 26  --  35  --  24  ALKPHOS 111  --  83  --  84  BILITOT 0.5  --  0.3  --  0.4  < > = values in this interval not displayed.  RADIOGRAPHIC STUDIES: I have personally reviewed the radiological images as listed and agreed with the findings in the report. No results found.  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  Clinically no evidence of progression noted.July 14th CT-Improved/CEA- trending down [124]  # proceed with FOLFIRI + Avastin cycle # 3  today.   Will get CT scan in 10 days.   # Hypokalemia- 3.8; recommend sup at home k dur BID.   # nausea/vomitting- G-1 resolved.   # diarrhea-G-1 continue Imodium and Lomotil;   # Elevation- sec to Avastin-  recommend cont benicar/hctx; and add extra Norvasc 5 mg BID.   #  follow-up with me in 2 weeks/ chemo. Labs 1 week; CT scans in 10 days.     [name], MD 03/16/2016 8:12 AM

## 2016-03-15 NOTE — Assessment & Plan Note (Addendum)
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  Clinically no evidence of progression noted.July 14th CT-Improved/CEA- trending down [124]  # proceed with FOLFIRI + Avastin cycle # 3  today.   Will get CT scan in 10 days.   # Hypokalemia- 3.8; recommend sup at home k dur BID.   # nausea/vomitting- G-1 resolved.   # diarrhea-G-1 continue Imodium and Lomotil;   # Elevation- sec to Avastin- recommend cont benicar/hctx; and add extra Norvasc 5 mg BID. Recommend checking blood pressures at home.  #  follow-up with me in 2 weeks/ chemo. Labs 1 week; CT scans in 10 days.

## 2016-03-17 ENCOUNTER — Inpatient Hospital Stay: Payer: Medicare HMO

## 2016-03-17 VITALS — BP 118/79 | HR 88 | Temp 96.5°F | Resp 20

## 2016-03-17 DIAGNOSIS — Z5111 Encounter for antineoplastic chemotherapy: Secondary | ICD-10-CM | POA: Diagnosis not present

## 2016-03-17 DIAGNOSIS — C182 Malignant neoplasm of ascending colon: Secondary | ICD-10-CM

## 2016-03-17 DIAGNOSIS — C189 Malignant neoplasm of colon, unspecified: Secondary | ICD-10-CM

## 2016-03-17 DIAGNOSIS — C7A8 Other malignant neuroendocrine tumors: Secondary | ICD-10-CM

## 2016-03-17 DIAGNOSIS — C799 Secondary malignant neoplasm of unspecified site: Secondary | ICD-10-CM

## 2016-03-17 MED ORDER — SODIUM CHLORIDE 0.9 % IV SOLN
Freq: Once | INTRAVENOUS | Status: AC
  Administered 2016-03-17: 14:00:00 via INTRAVENOUS
  Filled 2016-03-17: qty 1000

## 2016-03-17 MED ORDER — HEPARIN SOD (PORK) LOCK FLUSH 100 UNIT/ML IV SOLN
500.0000 [IU] | Freq: Once | INTRAVENOUS | Status: AC | PRN
  Administered 2016-03-17: 500 [IU]
  Filled 2016-03-17: qty 5

## 2016-03-17 MED ORDER — SODIUM CHLORIDE 0.9% FLUSH
10.0000 mL | INTRAVENOUS | Status: DC | PRN
  Administered 2016-03-17: 10 mL
  Filled 2016-03-17: qty 10

## 2016-03-22 ENCOUNTER — Inpatient Hospital Stay: Payer: Medicare HMO

## 2016-03-23 ENCOUNTER — Inpatient Hospital Stay: Payer: Medicare HMO | Attending: Internal Medicine

## 2016-03-23 DIAGNOSIS — R110 Nausea: Secondary | ICD-10-CM | POA: Diagnosis not present

## 2016-03-23 DIAGNOSIS — N289 Disorder of kidney and ureter, unspecified: Secondary | ICD-10-CM | POA: Insufficient documentation

## 2016-03-23 DIAGNOSIS — R197 Diarrhea, unspecified: Secondary | ICD-10-CM | POA: Diagnosis not present

## 2016-03-23 DIAGNOSIS — K449 Diaphragmatic hernia without obstruction or gangrene: Secondary | ICD-10-CM | POA: Diagnosis not present

## 2016-03-23 DIAGNOSIS — C7A1 Malignant poorly differentiated neuroendocrine tumors: Secondary | ICD-10-CM | POA: Insufficient documentation

## 2016-03-23 DIAGNOSIS — Z803 Family history of malignant neoplasm of breast: Secondary | ICD-10-CM | POA: Diagnosis not present

## 2016-03-23 DIAGNOSIS — D179 Benign lipomatous neoplasm, unspecified: Secondary | ICD-10-CM | POA: Diagnosis not present

## 2016-03-23 DIAGNOSIS — E876 Hypokalemia: Secondary | ICD-10-CM | POA: Insufficient documentation

## 2016-03-23 DIAGNOSIS — Z5111 Encounter for antineoplastic chemotherapy: Secondary | ICD-10-CM | POA: Diagnosis not present

## 2016-03-23 DIAGNOSIS — R911 Solitary pulmonary nodule: Secondary | ICD-10-CM | POA: Diagnosis not present

## 2016-03-23 DIAGNOSIS — I10 Essential (primary) hypertension: Secondary | ICD-10-CM | POA: Insufficient documentation

## 2016-03-23 DIAGNOSIS — C7B8 Other secondary neuroendocrine tumors: Secondary | ICD-10-CM | POA: Insufficient documentation

## 2016-03-23 DIAGNOSIS — E669 Obesity, unspecified: Secondary | ICD-10-CM | POA: Diagnosis not present

## 2016-03-23 DIAGNOSIS — C182 Malignant neoplasm of ascending colon: Secondary | ICD-10-CM

## 2016-03-23 LAB — CBC WITH DIFFERENTIAL/PLATELET
BASOS ABS: 0 10*3/uL (ref 0–0.1)
BASOS PCT: 1 %
EOS ABS: 0.1 10*3/uL (ref 0–0.7)
Eosinophils Relative: 3 %
HCT: 30.5 % — ABNORMAL LOW (ref 35.0–47.0)
Hemoglobin: 10.5 g/dL — ABNORMAL LOW (ref 12.0–16.0)
Lymphocytes Relative: 45 %
Lymphs Abs: 1.4 10*3/uL (ref 1.0–3.6)
MCH: 33.4 pg (ref 26.0–34.0)
MCHC: 34.6 g/dL (ref 32.0–36.0)
MCV: 96.7 fL (ref 80.0–100.0)
MONO ABS: 0.1 10*3/uL — AB (ref 0.2–0.9)
MONOS PCT: 4 %
NEUTROS PCT: 47 %
Neutro Abs: 1.5 10*3/uL (ref 1.4–6.5)
Platelets: 224 10*3/uL (ref 150–440)
RBC: 3.15 MIL/uL — ABNORMAL LOW (ref 3.80–5.20)
RDW: 20.7 % — AB (ref 11.5–14.5)
WBC: 3.2 10*3/uL — ABNORMAL LOW (ref 3.6–11.0)

## 2016-03-23 LAB — BASIC METABOLIC PANEL
Anion gap: 5 (ref 5–15)
BUN: 13 mg/dL (ref 6–20)
CALCIUM: 9.1 mg/dL (ref 8.9–10.3)
CO2: 25 mmol/L (ref 22–32)
CREATININE: 1.06 mg/dL — AB (ref 0.44–1.00)
Chloride: 104 mmol/L (ref 101–111)
GFR, EST NON AFRICAN AMERICAN: 54 mL/min — AB (ref >60)
Glucose, Bld: 101 mg/dL — ABNORMAL HIGH (ref 65–99)
Potassium: 3.9 mmol/L (ref 3.5–5.1)
SODIUM: 134 mmol/L — AB (ref 135–145)

## 2016-03-25 ENCOUNTER — Ambulatory Visit
Admission: RE | Admit: 2016-03-25 | Discharge: 2016-03-25 | Disposition: A | Discharge status: Home / Self Care | Payer: Medicare HMO | Source: Ambulatory Visit | Attending: Internal Medicine | Admitting: Internal Medicine

## 2016-03-25 DIAGNOSIS — C772 Secondary and unspecified malignant neoplasm of intra-abdominal lymph nodes: Secondary | ICD-10-CM | POA: Insufficient documentation

## 2016-03-25 DIAGNOSIS — C787 Secondary malignant neoplasm of liver and intrahepatic bile duct: Secondary | ICD-10-CM | POA: Insufficient documentation

## 2016-03-25 DIAGNOSIS — K449 Diaphragmatic hernia without obstruction or gangrene: Secondary | ICD-10-CM | POA: Diagnosis not present

## 2016-03-25 DIAGNOSIS — C189 Malignant neoplasm of colon, unspecified: Secondary | ICD-10-CM | POA: Diagnosis not present

## 2016-03-25 DIAGNOSIS — I700 Atherosclerosis of aorta: Secondary | ICD-10-CM | POA: Insufficient documentation

## 2016-03-25 DIAGNOSIS — C182 Malignant neoplasm of ascending colon: Secondary | ICD-10-CM | POA: Diagnosis not present

## 2016-03-25 MED ORDER — IOPAMIDOL (ISOVUE-300) INJECTION 61%
100.0000 mL | Freq: Once | INTRAVENOUS | Status: AC | PRN
  Administered 2016-03-25: 100 mL via INTRAVENOUS

## 2016-03-26 ENCOUNTER — Telehealth: Payer: Self-pay | Admitting: Internal Medicine

## 2016-03-26 NOTE — Telephone Encounter (Signed)
Spoke to pt re: results of the Ct scan- improved.

## 2016-03-27 DIAGNOSIS — C7A8 Other malignant neuroendocrine tumors: Secondary | ICD-10-CM | POA: Diagnosis not present

## 2016-03-27 DIAGNOSIS — C799 Secondary malignant neoplasm of unspecified site: Secondary | ICD-10-CM | POA: Diagnosis not present

## 2016-03-29 ENCOUNTER — Telehealth: Payer: Self-pay | Admitting: Internal Medicine

## 2016-03-29 ENCOUNTER — Inpatient Hospital Stay: Payer: Medicare HMO

## 2016-03-29 ENCOUNTER — Encounter: Payer: Self-pay | Admitting: Internal Medicine

## 2016-03-29 ENCOUNTER — Inpatient Hospital Stay (HOSPITAL_BASED_OUTPATIENT_CLINIC_OR_DEPARTMENT_OTHER): Payer: Medicare HMO | Admitting: Internal Medicine

## 2016-03-29 ENCOUNTER — Other Ambulatory Visit: Payer: Self-pay | Admitting: Internal Medicine

## 2016-03-29 VITALS — BP 142/89 | HR 82 | Temp 95.9°F | Resp 18 | Ht 64.0 in | Wt 187.2 lb

## 2016-03-29 VITALS — BP 126/86 | HR 98

## 2016-03-29 DIAGNOSIS — K449 Diaphragmatic hernia without obstruction or gangrene: Secondary | ICD-10-CM

## 2016-03-29 DIAGNOSIS — C7A1 Malignant poorly differentiated neuroendocrine tumors: Secondary | ICD-10-CM | POA: Diagnosis not present

## 2016-03-29 DIAGNOSIS — E669 Obesity, unspecified: Secondary | ICD-10-CM

## 2016-03-29 DIAGNOSIS — C189 Malignant neoplasm of colon, unspecified: Secondary | ICD-10-CM

## 2016-03-29 DIAGNOSIS — C182 Malignant neoplasm of ascending colon: Secondary | ICD-10-CM

## 2016-03-29 DIAGNOSIS — C799 Secondary malignant neoplasm of unspecified site: Secondary | ICD-10-CM

## 2016-03-29 DIAGNOSIS — R197 Diarrhea, unspecified: Secondary | ICD-10-CM

## 2016-03-29 DIAGNOSIS — C7A8 Other malignant neuroendocrine tumors: Secondary | ICD-10-CM | POA: Diagnosis not present

## 2016-03-29 DIAGNOSIS — R110 Nausea: Secondary | ICD-10-CM | POA: Diagnosis not present

## 2016-03-29 DIAGNOSIS — D179 Benign lipomatous neoplasm, unspecified: Secondary | ICD-10-CM

## 2016-03-29 DIAGNOSIS — Z5111 Encounter for antineoplastic chemotherapy: Secondary | ICD-10-CM | POA: Diagnosis not present

## 2016-03-29 DIAGNOSIS — E876 Hypokalemia: Secondary | ICD-10-CM | POA: Diagnosis not present

## 2016-03-29 DIAGNOSIS — N289 Disorder of kidney and ureter, unspecified: Secondary | ICD-10-CM

## 2016-03-29 DIAGNOSIS — I10 Essential (primary) hypertension: Secondary | ICD-10-CM

## 2016-03-29 DIAGNOSIS — C787 Secondary malignant neoplasm of liver and intrahepatic bile duct: Principal | ICD-10-CM

## 2016-03-29 DIAGNOSIS — C7B8 Other secondary neuroendocrine tumors: Secondary | ICD-10-CM

## 2016-03-29 DIAGNOSIS — Z803 Family history of malignant neoplasm of breast: Secondary | ICD-10-CM

## 2016-03-29 DIAGNOSIS — R911 Solitary pulmonary nodule: Secondary | ICD-10-CM

## 2016-03-29 LAB — COMPREHENSIVE METABOLIC PANEL
ALT: 24 U/L (ref 14–54)
ANION GAP: 5 (ref 5–15)
AST: 26 U/L (ref 15–41)
Albumin: 3.7 g/dL (ref 3.5–5.0)
Alkaline Phosphatase: 79 U/L (ref 38–126)
BUN: 15 mg/dL (ref 6–20)
CHLORIDE: 106 mmol/L (ref 101–111)
CO2: 26 mmol/L (ref 22–32)
Calcium: 9.3 mg/dL (ref 8.9–10.3)
Creatinine, Ser: 0.98 mg/dL (ref 0.44–1.00)
GFR, EST NON AFRICAN AMERICAN: 59 mL/min — AB (ref >60)
Glucose, Bld: 87 mg/dL (ref 65–99)
POTASSIUM: 4.1 mmol/L (ref 3.5–5.1)
SODIUM: 137 mmol/L (ref 135–145)
Total Bilirubin: 0.6 mg/dL (ref 0.3–1.2)
Total Protein: 8.1 g/dL (ref 6.5–8.1)

## 2016-03-29 LAB — URINALYSIS COMPLETE WITH MICROSCOPIC (ARMC ONLY)
BACTERIA UA: NONE SEEN
Bilirubin Urine: NEGATIVE
Glucose, UA: NEGATIVE mg/dL
Ketones, ur: NEGATIVE mg/dL
LEUKOCYTES UA: NEGATIVE
Nitrite: NEGATIVE
PH: 5 (ref 5.0–8.0)
PROTEIN: NEGATIVE mg/dL
SPECIFIC GRAVITY, URINE: 1.012 (ref 1.005–1.030)

## 2016-03-29 LAB — CBC WITH DIFFERENTIAL/PLATELET
BASOS PCT: 1 %
Basophils Absolute: 0 10*3/uL (ref 0–0.1)
EOS ABS: 0.1 10*3/uL (ref 0–0.7)
Eosinophils Relative: 4 %
HCT: 30.8 % — ABNORMAL LOW (ref 35.0–47.0)
HEMOGLOBIN: 10.6 g/dL — AB (ref 12.0–16.0)
Lymphocytes Relative: 37 %
Lymphs Abs: 1.2 10*3/uL (ref 1.0–3.6)
MCH: 33.6 pg (ref 26.0–34.0)
MCHC: 34.5 g/dL (ref 32.0–36.0)
MCV: 97.4 fL (ref 80.0–100.0)
Monocytes Absolute: 0.3 10*3/uL (ref 0.2–0.9)
Monocytes Relative: 9 %
NEUTROS PCT: 49 %
Neutro Abs: 1.6 10*3/uL (ref 1.4–6.5)
PLATELETS: 245 10*3/uL (ref 150–440)
RBC: 3.17 MIL/uL — AB (ref 3.80–5.20)
RDW: 20.6 % — ABNORMAL HIGH (ref 11.5–14.5)
WBC: 3.3 10*3/uL — AB (ref 3.6–11.0)

## 2016-03-29 LAB — MAGNESIUM: MAGNESIUM: 1.8 mg/dL (ref 1.7–2.4)

## 2016-03-29 LAB — PROTEIN, URINE, RANDOM

## 2016-03-29 MED ORDER — IRINOTECAN HCL CHEMO INJECTION 100 MG/5ML
140.0000 mg/m2 | Freq: Once | INTRAVENOUS | Status: AC
  Administered 2016-03-29: 280 mg via INTRAVENOUS
  Filled 2016-03-29: qty 14

## 2016-03-29 MED ORDER — LORAZEPAM 2 MG/ML IJ SOLN
1.0000 mg | Freq: Once | INTRAMUSCULAR | Status: AC
  Administered 2016-03-29: 1 mg via INTRAVENOUS
  Filled 2016-03-29: qty 1

## 2016-03-29 MED ORDER — SODIUM CHLORIDE 0.9 % IV SOLN
Freq: Once | INTRAVENOUS | Status: AC
  Administered 2016-03-29: 13:00:00 via INTRAVENOUS
  Filled 2016-03-29: qty 5

## 2016-03-29 MED ORDER — LEUCOVORIN CALCIUM INJECTION 350 MG
800.0000 mg | Freq: Once | INTRAVENOUS | Status: AC
  Administered 2016-03-29: 800 mg via INTRAVENOUS
  Filled 2016-03-29: qty 5

## 2016-03-29 MED ORDER — ATROPINE SULFATE 1 MG/ML IJ SOLN
0.5000 mg | Freq: Once | INTRAMUSCULAR | Status: AC | PRN
  Administered 2016-03-29: 0.5 mg via INTRAVENOUS
  Filled 2016-03-29: qty 1

## 2016-03-29 MED ORDER — PALONOSETRON HCL INJECTION 0.25 MG/5ML
0.2500 mg | Freq: Once | INTRAVENOUS | Status: AC
  Administered 2016-03-29: 0.25 mg via INTRAVENOUS
  Filled 2016-03-29: qty 5

## 2016-03-29 MED ORDER — SODIUM CHLORIDE 0.9 % IV SOLN
400.0000 mg | Freq: Once | INTRAVENOUS | Status: AC
  Administered 2016-03-29: 400 mg via INTRAVENOUS
  Filled 2016-03-29: qty 16

## 2016-03-29 MED ORDER — SODIUM CHLORIDE 0.9 % IV SOLN
Freq: Once | INTRAVENOUS | Status: AC
  Administered 2016-03-29: 13:00:00 via INTRAVENOUS
  Filled 2016-03-29: qty 1000

## 2016-03-29 MED ORDER — SODIUM CHLORIDE 0.9 % IV SOLN
2400.0000 mg/m2 | INTRAVENOUS | Status: DC
  Administered 2016-03-29: 4750 mg via INTRAVENOUS
  Filled 2016-03-29: qty 95

## 2016-03-29 MED ORDER — SODIUM CHLORIDE 0.9 % IV SOLN: 10.0000 mg | Freq: Once | INTRAVENOUS | Status: DC

## 2016-03-29 NOTE — Telephone Encounter (Signed)
Spoke to Dr.Rubinas; awaiting on pathology. Spoke to pt re: wait.

## 2016-03-29 NOTE — Assessment & Plan Note (Addendum)
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   # Clinically no evidence of progression noted.; Improved/CEA- trending down [124]  # proceed with FOLFIRI + Avastin cycle # 4  today.  Also refer to IR for local liver directed therapy.   # Hypokalemia- 3.8; recommend sup at home k dur BID.   # intractable nausea/vomitting/abdominal cramping G-2; add emend; ativan; decreased iri- by 20%. Also we will consider spacing of the chemotherapy every 3 weeks  # diarrhea-G-1 continue Imodium and Lomotil;   # Blood pressure Elevation- sec to Avastin- recommend cont benicar/hctx; and  Norvasc as needed.  Improved.   #  follow-up with me in 2 weeks/ chemo.    # I reviewed the blood work- with the patient in detail; also reviewed the imaging independently [as summarized above]; and with the patient in detail.

## 2016-03-29 NOTE — Progress Notes (Signed)
Main concern is n/v after chemo.  Can vomit up to 15 times in one day.  Pt reports it is getting worse.  Would like to schedule IV fluids for pump d/c day.

## 2016-03-29 NOTE — Progress Notes (Signed)
[facility] NOTE  [name], [name], [name], [name], [name]; [name]; [name]; [name]; [name]; [name]; [name]; [name]; July 14th  2017 CT- PR  # AUG 28th- FOLFIRI +Avastin; OCT 5th CT scan- PR.   # right kidney lesion 2.6 x 1.6 cm ? 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 Denies any abdominal pain. Mild diarrhea controlled with Lomotil and Imodium. Otherwise no weight loss.  Patient's fatigue is improved. No cramping in the legs no tingling and numbness. Denies any skin rash. No fevers or chills. No sores in the mouth. No chest pain or shortness of the cough.  ROS: A complete 10 point review of system is done which is negative except mentioned above in history of present illness  MEDICAL HISTORY:  Past Medical History:  Diagnosis Date  . Angiolipoma of kidney    followed with serial CTs ,,   Dr. Jacqlyn Larsen  . Arthritis   . Cancer ([place])    colon  . Chemotherapy induced diarrhea   . Hammer toe   . Hypertension   . Neuro-endocrine carcinoma ([place])   . Obesity (BMI 30-39.9)     SURGICAL HISTORY: Past Surgical History:  Procedure Laterality Date  . ABDOMINAL HYSTERECTOMY  1995   endometriosis, heavy bleeding  . BUNIONECTOMY Bilateral   . CESAREAN SECTION  1985  . COLONOSCOPY WITH PROPOFOL N/A 10/09/2015   Procedure: COLONOSCOPY WITH PROPOFOL;  Surgeon: Robert Bellow, MD;  Location: [facility] ENDOSCOPY;  Service: Endoscopy;  Laterality: N/A;  . OOPHORECTOMY    . PORTACATH PLACEMENT Left 10/01/2015   Procedure: INSERTION PORT-A-CATH;  Surgeon: Robert Bellow, MD;  Location: [facility] ORS;  Service: General;  Laterality: Left;  . ROTATOR CUFF REPAIR Right 2008   right shoulder.  [place]     SOCIAL HISTORY: She lives at home with her family in [place]. 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 Social History   Social History  . Marital status: Married    Spouse name: N/A  . Number of children: N/A  . Years of education: N/A   Occupational History  . Not on file.   Social History Main Topics  . Smoking status: Never Smoker  . Smokeless tobacco: Never Used  . Alcohol use 0.0 oz/week     Comment: occasionally  . Drug use: No  . Sexual activity: Not Currently  Other Topics Concern  . Not on file   Social History Narrative  . No narrative on file    FAMILY HISTORY: Family History  Problem Relation Age of Onset  . Mental illness Mother 64    bipolar, dementia  . Cancer Maternal Aunt 70    breast ca  . Hypertension Father   . Stroke Father 77    deceased  . COPD Father   . Heart disease Sister     deceased    ALLERGIES:  is allergic to bee venom and morphine and related.  MEDICATIONS:  Current Outpatient Prescriptions  Medication Sig Dispense Refill  . ALPRAZolam (XANAX) 0.25 MG tablet Take 1 tablet (0.25 mg total)  by mouth daily as needed for sleep or anxiety. 90 tablet 5  . amLODipine (NORVASC) 5 MG tablet TAKE ONE TABLET BY MOUTH DAILY 30 tablet 3  . diphenoxylate-atropine (LOMOTIL) 2.5-0.025 MG tablet Take 1 tablet by mouth 4 (four) times daily as needed for diarrhea or loose stools. 30 tablet 6  . lidocaine-prilocaine (EMLA) cream Apply 1 application topically as needed. Apply to port a cath site 1 hour prior to chemotherapy treatments 30 g 6  . olmesartan (BENICAR) 40 MG tablet Take 1 tablet (40 mg total) by mouth daily. 30 tablet 2  . olmesartan-hydrochlorothiazide (BENICAR HCT) 40-12.5 MG tablet TAKE ONE TABLET BY MOUTH EVERY DAY 30 tablet 3  . ondansetron (ZOFRAN) 8 MG tablet Take 1 tablet (8 mg total) by mouth every 6 (six) hours as needed for nausea or vomiting. 30 tablet 3  . potassium chloride (K-DUR) 10 MEQ tablet Take 1 tablet (10 mEq total) by mouth 2 (two) times daily. 60 tablet 3  . prochlorperazine (COMPAZINE) 10 MG tablet Take 1 tablet (10 mg total) by mouth every 6 (six) hours as needed for nausea or vomiting. 30 tablet 6   No current facility-administered medications for this visit.    Facility-Administered Medications Ordered in Other Visits  Medication Dose Route Frequency Provider Last Rate Last Dose  . heparin lock flush 100 unit/mL  500 Units Intravenous Once [name], MD      . sodium chloride flush (NS) 0.9 % injection 10 mL  10 mL Intravenous PRN [name], MD   10 mL at 11/24/15 0836      .  PHYSICAL EXAMINATION: ECOG PERFORMANCE STATUS: 0 - Asymptomatic  Vitals:   03/29/16 0929  BP: (!) 142/89  Pulse: 82  Resp: 18  Temp: (!) 95.9 F (35.5 C)   Filed Weights   03/29/16 0929  Weight: 187 lb 3.2 oz (84.9 kg)    GENERAL: Well-nourished well-developed; Alert, no distress and comfortable. She is with daughter.  EYES: no pallor or icterus OROPHARYNX: no thrush or ulceration; good dentition  NECK: supple, no masses felt LYMPH:  no palpable  lymphadenopathy in the cervical, axillary or inguinal regions LUNGS: clear to auscultation and  No wheeze or crackles HEART/CVS: regular rate & rhythm and no murmurs; No lower extremity edema ABDOMEN: abdomen soft, non-tender and normal bowel sounds; positive for hepatomegaly. Musculoskeletal:no cyanosis of digits and no clubbing  PSYCH: alert & oriented x 3 with fluent speech NEURO: no focal motor/sensory deficits SKIN:  no rashes or significant lesions; Mediport is in place. Positive for alopecia.  LABORATORY DATA:  I have reviewed the data as listed Lab Results  Component Value Date   WBC 3.3 (L) 03/29/2016   HGB 10.6 (L) 03/29/2016   HCT 30.8 (L) 03/29/2016  MCV 97.4 03/29/2016   PLT 245 03/29/2016    Recent Labs  03/01/16 0900  03/15/16 0854 03/23/16 0930 03/29/16 0906  NA 134*  < > 136 134* 137  K 3.8  < > 3.9 3.9 4.1  CL 108  < > 108 104 106  CO2 21*  < > 23 25 26   GLUCOSE 108*  < > 90 101* 87  BUN 10  < > 15 13 15   CREATININE 0.80  < > 0.93 1.06* 0.98  CALCIUM 8.9  < > 9.0 9.1 9.3  GFRNONAA >60  < > >60 54* 59*  GFRAA >60  < > >60 >60 >60  PROT 7.8  --  8.0  --  8.1  ALBUMIN 3.7  --  3.8  --  3.7  AST 32  --  26  --  26  ALT 35  --  24  --  24  ALKPHOS 83  --  84  --  79  BILITOT 0.3  --  0.4  --  0.6  < > = values in this interval not displayed.  RADIOGRAPHIC STUDIES: I have personally reviewed the radiological images as listed and agreed with the findings in the report. Ct Abdomen Pelvis W Contrast  Result Date: 03/25/2016 CLINICAL DATA:  Metastatic ascending colon cancer presenting for restaging status post chemotherapy. EXAM: CT ABDOMEN AND PELVIS WITH CONTRAST TECHNIQUE: Multidetector CT imaging of the abdomen and pelvis was performed using the standard protocol following bolus administration of intravenous contrast. CONTRAST:  110mL ISOVUE-300 IOPAMIDOL (ISOVUE-300) INJECTION 61% COMPARISON:  01/02/2016 CT abdomen/ pelvis. FINDINGS: Lower chest:  Subpleural 4 mm perifissural right pulmonary nodule (series 4/ image 6) is stable back to the 05/25/2010 and considered benign. Hepatobiliary: There are at least 6 hypodense liver masses scattered throughout the liver, which are all decreased in size, with representative masses as follows: - superior right liver lobe 5.2 x 3.7 cm mass (series 2/image 17), previously 7.7 x 4.9 cm, decreased - segment 4A left liver lobe 1.3 x 1.2 cm mass (series 2/image 20), previously 2.3 x 2.2 cm, decreased - right liver dome 1.2 x 1.0 cm mass (series 2/image 10), previously 2.0 x 1.8 cm, decreased No new liver masses. Small simple liver cysts scattered in the liver are stable measuring up to 1.3 cm in the lateral segment left liver lobe. Stable probable hemangioma in the far inferior right liver lobe measuring 1.7 cm. Normal gallbladder with no radiopaque cholelithiasis. No biliary ductal dilatation. Pancreas: Normal, with no mass or duct dilation. Spleen: Normal size. No mass. Adrenals/Urinary Tract: Normal adrenals. Hypodense 2.5 x 1.4 cm posterior lower right renal mass (series 2/ image 34) is stable in the interval, contains tiny internal fat density foci, and is mildly enlarged from 1.7 x 1.3 cm compared to the remote 05/25/2010 CT study, consistent with a renal angiomyolipoma. No new renal lesions. No hydronephrosis. Normal bladder. Stomach/Bowel: Small hiatal hernia. Otherwise grossly normal stomach. Normal caliber small bowel with no small bowel wall thickening. Normal appendix . Persistent focal wall thickening in the medial ascending colon just above the ileocecal valve (series 2/ image 42), not appreciably changed, consistent with primary colonic carcinoma. Vascular/Lymphatic: Atherosclerotic nonaneurysmal abdominal aorta. Patent portal, splenic, hepatic and renal veins. There are several clustered enlarged right mesenteric nodes, which have decreased in size. For example, a 2.0 cm right mesenteric node (series 2/ image  46) is decreased from 2.4 cm. No additional pathologically enlarged abdominopelvic nodes. Reproductive: Status post hysterectomy, with no  abnormal findings at the vaginal cuff. No adnexal mass. Other: No pneumoperitoneum, ascites or focal fluid collection. Musculoskeletal: No aggressive appearing focal osseous lesions. Moderate thoracolumbar spondylosis. IMPRESSION: 1. Partial treatment response. Liver metastases are decreased in size. Right mesenteric nodal metastases are decreased in size. No new or progressive metastatic disease in the abdomen or pelvis. 2. Stable eccentric wall thickening in the medial ascending colon above the ileocecal valve, consistent with primary colonic carcinoma. 3. Additional findings include stable lower right renal angiomyolipoma, small hiatal hernia and aortic atherosclerosis. Electronically Signed   By: Ilona Sorrel M.D.   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   # Clinically no evidence of progression noted.; Improved/CEA- trending down [124]  # proceed with FOLFIRI + Avastin cycle # 4  today.  Also refer to IR for local liver directed therapy.   # Hypokalemia- 3.8; recommend sup at home k dur BID.   # intractable nausea/vomitting/abdominal cramping G-2; add emend; ativan; decreased iri- by 20%. Also we will consider spacing of the chemotherapy every 3 weeks  # diarrhea-G-1 continue Imodium and Lomotil;   # Blood pressure Elevation- sec to Avastin- recommend cont benicar/hctx; and  Norvasc as needed.  Improved.   #  follow-up with me in 2 weeks/ chemo.    # I reviewed the blood work- with the patient in detail; also reviewed the imaging independently [as summarized above]; and with the patient in detail.     [name], MD 03/31/2016 7:47 AM

## 2016-03-30 LAB — CEA: CEA: 118.8 ng/mL — ABNORMAL HIGH (ref 0.0–4.7)

## 2016-03-31 ENCOUNTER — Inpatient Hospital Stay: Payer: Medicare HMO

## 2016-03-31 VITALS — BP 133/86 | HR 81 | Resp 18

## 2016-03-31 DIAGNOSIS — C801 Malignant (primary) neoplasm, unspecified: Secondary | ICD-10-CM

## 2016-03-31 DIAGNOSIS — C799 Secondary malignant neoplasm of unspecified site: Secondary | ICD-10-CM

## 2016-03-31 DIAGNOSIS — C7A8 Other malignant neuroendocrine tumors: Secondary | ICD-10-CM

## 2016-03-31 DIAGNOSIS — Z5111 Encounter for antineoplastic chemotherapy: Secondary | ICD-10-CM | POA: Diagnosis not present

## 2016-03-31 DIAGNOSIS — C182 Malignant neoplasm of ascending colon: Secondary | ICD-10-CM

## 2016-03-31 DIAGNOSIS — C189 Malignant neoplasm of colon, unspecified: Secondary | ICD-10-CM

## 2016-03-31 MED ORDER — HEPARIN SOD (PORK) LOCK FLUSH 100 UNIT/ML IV SOLN
INTRAVENOUS | Status: AC
  Filled 2016-03-31: qty 5

## 2016-03-31 MED ORDER — SODIUM CHLORIDE 0.9 % IJ SOLN
10.0000 mL | Freq: Once | INTRAMUSCULAR | Status: AC
  Administered 2016-03-31: 10 mL via INTRAVENOUS
  Filled 2016-03-31: qty 10

## 2016-03-31 MED ORDER — HEPARIN SOD (PORK) LOCK FLUSH 100 UNIT/ML IV SOLN
500.0000 [IU] | Freq: Once | INTRAVENOUS | Status: AC
  Administered 2016-03-31: 500 [IU] via INTRAVENOUS

## 2016-04-05 ENCOUNTER — Inpatient Hospital Stay: Payer: Medicare HMO

## 2016-04-05 ENCOUNTER — Other Ambulatory Visit: Payer: Self-pay

## 2016-04-05 DIAGNOSIS — Z5111 Encounter for antineoplastic chemotherapy: Secondary | ICD-10-CM | POA: Diagnosis not present

## 2016-04-05 DIAGNOSIS — C182 Malignant neoplasm of ascending colon: Secondary | ICD-10-CM

## 2016-04-05 LAB — CBC WITH DIFFERENTIAL/PLATELET
Basophils Absolute: 0 10*3/uL (ref 0–0.1)
Basophils Relative: 1 %
EOS PCT: 2 %
Eosinophils Absolute: 0.1 10*3/uL (ref 0–0.7)
HEMATOCRIT: 31.9 % — AB (ref 35.0–47.0)
Hemoglobin: 10.9 g/dL — ABNORMAL LOW (ref 12.0–16.0)
LYMPHS PCT: 52 %
Lymphs Abs: 1.5 10*3/uL (ref 1.0–3.6)
MCH: 34.1 pg — AB (ref 26.0–34.0)
MCHC: 34.2 g/dL (ref 32.0–36.0)
MCV: 99.7 fL (ref 80.0–100.0)
MONO ABS: 0.1 10*3/uL — AB (ref 0.2–0.9)
MONOS PCT: 3 %
NEUTROS ABS: 1.2 10*3/uL — AB (ref 1.4–6.5)
Neutrophils Relative %: 42 %
PLATELETS: 256 10*3/uL (ref 150–440)
RBC: 3.2 MIL/uL — ABNORMAL LOW (ref 3.80–5.20)
RDW: 19.2 % — AB (ref 11.5–14.5)
WBC: 2.9 10*3/uL — ABNORMAL LOW (ref 3.6–11.0)

## 2016-04-05 LAB — BASIC METABOLIC PANEL
Anion gap: 6 (ref 5–15)
BUN: 17 mg/dL (ref 6–20)
CALCIUM: 9 mg/dL (ref 8.9–10.3)
CO2: 25 mmol/L (ref 22–32)
CREATININE: 0.99 mg/dL (ref 0.44–1.00)
Chloride: 104 mmol/L (ref 101–111)
GFR calc Af Amer: >60 mL/min (ref >60)
GFR, EST NON AFRICAN AMERICAN: 59 mL/min — AB (ref >60)
GLUCOSE: 94 mg/dL (ref 65–99)
Potassium: 4.2 mmol/L (ref 3.5–5.1)
Sodium: 135 mmol/L (ref 135–145)

## 2016-04-07 ENCOUNTER — Ambulatory Visit
Admission: RE | Admit: 2016-04-07 | Discharge: 2016-04-07 | Disposition: A | Discharge status: Home / Self Care | Payer: Medicare HMO | Source: Ambulatory Visit | Attending: Internal Medicine | Admitting: Internal Medicine

## 2016-04-07 DIAGNOSIS — C189 Malignant neoplasm of colon, unspecified: Secondary | ICD-10-CM

## 2016-04-07 DIAGNOSIS — C787 Secondary malignant neoplasm of liver and intrahepatic bile duct: Principal | ICD-10-CM

## 2016-04-07 DIAGNOSIS — C7A022 Malignant carcinoid tumor of the ascending colon: Secondary | ICD-10-CM | POA: Diagnosis not present

## 2016-04-07 HISTORY — PX: IR GENERIC HISTORICAL: IMG1180011

## 2016-04-07 NOTE — Consult Note (Addendum)
Chief Complaint: Patient was seen in consultation today for  Chief Complaint  Patient presents with  . Advice Only    Consult for Liver Metastasis   at the request of Brahmanday,Govinda R  Referring Physician(s): Brahmanday,Govinda R  History of Present Illness: Kathleen James is a 65 y.o. female  sent for evaluation of metastatic colonic neuroendocrine carcinoma. She was accompanied by her daughter.  The patient initially presented for ultrasound on 09/09/2015 with a history of right upper quadrant abdominal pain postprandial. Ultrasound suggested hepatic metastatic disease.  Follow-up CT confirmed multiple liver metastases measure up to 8.4 cm, as well as an ascending colon mass with right ileocolic adenopathy.  Liver core biopsy 09/26/2015 demonstrated metastatic neuroendocrine carcinoma. A follow-up colon Biopsy matched the liver pathology.  PET CT 10/06/2015 demonstrated hypermetabolic activity in the colonic lesion, ileocolic adenopathy, and liver lesions. No evidence of extra abdominal metastatic disease.  She's been treated with FOLFIRINOX, 4 cycles. This was changed to FOLFIRI+ Avastin.  Follow-up CT shows significant improvement in known disease, decrease in size of hepatic lesions dramatically. No new lesions or other evidence of progression of disease. Patient describes no pain or systemic side effects currently.  Past Medical History:  Diagnosis Date  . Angiolipoma of kidney    followed with serial CTs ,,  Dr. Jacqlyn Larsen  . Arthritis   . Cancer ([place])    colon  . Chemotherapy induced diarrhea   . Hammer toe   . Hypertension   . Neuro-endocrine carcinoma ([place])   . Obesity (BMI 30-39.9)     Past Surgical History:  Procedure Laterality Date  . ABDOMINAL HYSTERECTOMY  1995   endometriosis, heavy bleeding  . BUNIONECTOMY Bilateral   . CESAREAN SECTION  1985  . COLONOSCOPY WITH PROPOFOL N/A 10/09/2015   Procedure: COLONOSCOPY WITH PROPOFOL;  Surgeon: Robert Bellow, MD;  [facility] ENDOSCOPY;  Service: Endoscopy;  Laterality: N/A;  . OOPHORECTOMY    . PORTACATH PLACEMENT Left 10/01/2015   Procedure: INSERTION PORT-A-CATH;  Surgeon: Robert Bellow, MD;  Location: [facility] ORS;  Service: General;  Laterality: Left;  . ROTATOR CUFF REPAIR Right 2008   right shoulder.  [place]     Allergies: Bee venom and Morphine and related  Medications: Prior to Admission medications   Medication Sig Start Date End Date Taking? Authorizing Provider  ALPRAZolam (XANAX) 0.25 MG tablet Take 1 tablet (0.25 mg total) by mouth daily as needed for sleep or anxiety. 09/18/15  Yes [name], MD  amLODipine (NORVASC) 5 MG tablet TAKE ONE TABLET BY MOUTH DAILY 03/03/16  Yes [name], MD  diphenoxylate-atropine (LOMOTIL) 2.5-0.025 MG tablet Take 1 tablet by mouth 4 (four) times daily as needed for diarrhea or loose stools. 12/29/15  Yes [name], MD  lidocaine-prilocaine (EMLA) cream Apply 1 application topically as needed. Apply to port a cath site 1 hour prior to chemotherapy treatments 10/07/15  Yes [name], MD  olmesartan (BENICAR) 40 MG tablet Take 1 tablet (40 mg total) by mouth daily. 09/18/15  Yes [name], MD  ondansetron (ZOFRAN) 8 MG tablet Take 1 tablet (8 mg total) by mouth every 6 (six) hours as needed for nausea or vomiting. 12/19/15  Yes [name], MD  potassium chloride (K-DUR) 10 MEQ tablet Take 1 tablet (10 mEq total) by mouth 2 (two) times daily. 01/09/16  Yes [name], MD  prochlorperazine (COMPAZINE) 10 MG tablet Take 1 tablet (  10 mg total) by mouth every 6 (six) hours as needed for nausea or vomiting. 10/07/15  Yes [name], MD  olmesartan-hydrochlorothiazide (BENICAR HCT) 40-12.5 MG tablet TAKE ONE TABLET BY MOUTH EVERY DAY Patient not taking: Reported on 04/07/2016 03/03/16   [name], MD     Family History  Problem Relation Age of Onset  . Mental illness Mother 59     bipolar, dementia  . Cancer Maternal Aunt 70    breast ca  . Hypertension Father   . Stroke Father 32    deceased  . COPD Father   . Heart disease Sister     deceased    Social History   Social History  . Marital status: Married    Spouse name: N/A  . Number of children: N/A  . Years of education: N/A   Social History Main Topics  . Smoking status: Never Smoker  . Smokeless tobacco: Never Used  . Alcohol use 0.0 oz/week     Comment: occasionally  . Drug use: No  . Sexual activity: Not Currently   Other Topics Concern  . Not on file   Social History Narrative  . No narrative on file    ECOG Status: 2 - Symptomatic, <50% confined to bed  Review of Systems: A 12 point ROS discussed and pertinent positives are indicated in the HPI above.  All other systems are negative.  Review of Systems  Vital Signs: BP (!) 160/97 (BP Location: Left Arm, Patient Position: Sitting, Cuff Size: Normal)   Pulse 97   Temp 98.2 F (36.8 C) (Oral)   Resp 15   Ht 5\' 4"  (1.626 m)   Wt 187 lb (84.8 kg)   SpO2 100%   BMI 32.10 kg/m   Physical Exam  Mallampati Score:     Imaging: Ct Abdomen Pelvis W Contrast  Result Date: 03/25/2016 CLINICAL DATA:  Metastatic ascending colon cancer presenting for restaging status post chemotherapy. EXAM: CT ABDOMEN AND PELVIS WITH CONTRAST TECHNIQUE: Multidetector CT imaging of the abdomen and pelvis was performed using the standard protocol following bolus administration of intravenous contrast. CONTRAST:  134mL ISOVUE-300 IOPAMIDOL (ISOVUE-300) INJECTION 61% COMPARISON:  01/02/2016 CT abdomen/ pelvis. FINDINGS: Lower chest: Subpleural 4 mm perifissural right pulmonary nodule (series 4/ image 6) is stable back to the 05/25/2010 and considered benign. Hepatobiliary: There are at least 6 hypodense liver masses scattered throughout the liver, which are all decreased in size, with representative masses as follows: - superior right liver lobe 5.2 x 3.7 cm  mass (series 2/image 17), previously 7.7 x 4.9 cm, decreased - segment 4A left liver lobe 1.3 x 1.2 cm mass (series 2/image 20), previously 2.3 x 2.2 cm, decreased - right liver dome 1.2 x 1.0 cm mass (series 2/image 10), previously 2.0 x 1.8 cm, decreased No new liver masses. Small simple liver cysts scattered in the liver are stable measuring up to 1.3 cm in the lateral segment left liver lobe. Stable probable hemangioma in the far inferior right liver lobe measuring 1.7 cm. Normal gallbladder with no radiopaque cholelithiasis. No biliary ductal dilatation. Pancreas: Normal, with no mass or duct dilation. Spleen: Normal size. No mass. Adrenals/Urinary Tract: Normal adrenals. Hypodense 2.5 x 1.4 cm posterior lower right renal mass (series 2/ image 34) is stable in the interval, contains tiny internal fat density foci, and is mildly enlarged from 1.7 x 1.3 cm compared to the remote 05/25/2010 CT study, consistent with a renal angiomyolipoma. No new renal lesions. No  hydronephrosis. Normal bladder. Stomach/Bowel: Small hiatal hernia. Otherwise grossly normal stomach. Normal caliber small bowel with no small bowel wall thickening. Normal appendix . Persistent focal wall thickening in the medial ascending colon just above the ileocecal valve (series 2/ image 42), not appreciably changed, consistent with primary colonic carcinoma. Vascular/Lymphatic: Atherosclerotic nonaneurysmal abdominal aorta. Patent portal, splenic, hepatic and renal veins. There are several clustered enlarged right mesenteric nodes, which have decreased in size. For example, a 2.0 cm right mesenteric node (series 2/ image 46) is decreased from 2.4 cm. No additional pathologically enlarged abdominopelvic nodes. Reproductive: Status post hysterectomy, with no abnormal findings at the vaginal cuff. No adnexal mass. Other: No pneumoperitoneum, ascites or focal fluid collection. Musculoskeletal: No aggressive appearing focal osseous lesions. Moderate  thoracolumbar spondylosis. IMPRESSION: 1. Partial treatment response. Liver metastases are decreased in size. Right mesenteric nodal metastases are decreased in size. No new or progressive metastatic disease in the abdomen or pelvis. 2. Stable eccentric wall thickening in the medial ascending colon above the ileocecal valve, consistent with primary colonic carcinoma. 3. Additional findings include stable lower right renal angiomyolipoma, small hiatal hernia and aortic atherosclerosis. Electronically Signed   By: Ilona Sorrel M.D.   On: 03/25/2016 12:08    Labs:  CBC:  Recent Labs  03/15/16 0854 03/23/16 0930 03/29/16 0906 04/05/16 1130  WBC 3.7 3.2* 3.3* 2.9*  HGB 10.1* 10.5* 10.6* 10.9*  HCT 29.2* 30.5* 30.8* 31.9*  PLT 227 224 245 256    COAGS:  Recent Labs  09/23/15 1504  INR 0.98  APTT 27    BMP:  Recent Labs  03/15/16 0854 03/23/16 0930 03/29/16 0906 04/05/16 1130  NA 136 134* 137 135  K 3.9 3.9 4.1 4.2  CL 108 104 106 104  CO2 23 25 26 25   GLUCOSE 90 101* 87 94  BUN 15 13 15 17   CALCIUM 9.0 9.1 9.3 9.0  CREATININE 0.93 1.06* 0.98 0.99  GFRNONAA >60 54* 59* 59*  GFRAA >60 >60 >60 >60    LIVER FUNCTION TESTS:  Recent Labs  02/16/16 0850 03/01/16 0900 03/15/16 0854 03/29/16 0906  BILITOT 0.5 0.3 0.4 0.6  AST 31 32 26 26  ALT 26 35 24 24  ALKPHOS 111 83 84 79  PROT 7.7 7.8 8.0 8.1  ALBUMIN 3.5 3.7 3.8 3.7    TUMOR MARKERS:  Recent Labs  01/07/16 0907 02/02/16 0903 03/01/16 1100 03/29/16 0906  CEA 415.6* 242.1* 173.6* 118.8*    Assessment and Plan:  My impression is that this patient's metastatic ascending colon neuroendocrine carcinoma has shown partial response to her chemotherapy regimen thus far. She is not describing symptoms related to tumor hormone production. She is tolerating her chemotherapy regimen currently. I discussed with the patient and her daughter who is a nurse the findings on imaging thus far. She has liver dominant  disease, with some residual disease in the descending colon and ileocolonic mesentery. We discussed liver directed treatment options and specifically transarterial Y 90 radio embolization.   Y90 radioembolization can be an effective treatment for liver metastases of neuroendocrine tumours (mNETs) as first-line therapy or salvage therapy in patients with treatment-refractory disease. In patients with unresectable progressive or symptomatic and treatment-refractory mNET receiving first-line   Y-90   (with fluorouracil [5FU]), median OS was generally between 29 to 36 months, and in the largest study of 148 patients the median OS was 70 months. In addition, symptoms were improved in 55% of this patient group,  and health-related QoL was  stable or improved over 24 months.  Y-90 can be also an effective salvage therapy for patients with mNET who have failed to respond to other types of medical, surgical, or local ablative treatment modalities (including peptide receptor radionucleotide therapy). In this setting,   Y-90   may achieve an ORR of 12.5-66.0%, and TTP and survival rates are encouraging; survival rates of 100%, 57% and 35% have been reported at 1, 3 and 5 years, respectively.  Overall, studies have demonstrated that in patients with treatment-refractory disease as either first-line treatment or salvage therapy for liver metastases,   Y-90   is efficacious and associated with good tolerability and improvements in health-related QoL.  We discussed the need for a preliminary mapping angiogram with possible embolization of any at-risk mesenteric branches. We discussed the procedure of transarterial Y 90 radio embolization, pathophysiology of radio embolization in the liver, possible side effects, need for continued surveillance, hepatic radiosensitivity, and concerns regarding nontarget embolization. We discussed that this would not be considered a curative treatment, but help in the long-term management of  for metastatic disease.  Currently, she has shown tolerance of and good response to her current chemotherapy regimen, and no current untreated tumor-related symptoms. Should she become treatment-refractory, we could proceed with Y90 mapping and radioembolization.  Thank you for this interesting consult.  I greatly enjoyed meeting ADALISE KORANDA and look forward to participating in their care.  A copy of this report was sent to the requesting provider on this date.  Electronically Signed: River Mckercher III, DAYNE Kemuel Buchmann 04/07/2016, 3:22 PM   I spent a total of  60 Minutes   in face to face in clinical consultation, greater than 50% of which was counseling/coordinating care for metastatic colonic neuroendocrine carcinoma.

## 2016-04-12 ENCOUNTER — Inpatient Hospital Stay (HOSPITAL_BASED_OUTPATIENT_CLINIC_OR_DEPARTMENT_OTHER): Payer: Medicare HMO | Admitting: Internal Medicine

## 2016-04-12 ENCOUNTER — Inpatient Hospital Stay: Payer: Medicare HMO

## 2016-04-12 ENCOUNTER — Other Ambulatory Visit: Payer: Self-pay

## 2016-04-12 VITALS — BP 132/89 | HR 87

## 2016-04-12 VITALS — BP 136/91 | HR 84 | Temp 97.8°F | Resp 18 | Wt 189.0 lb

## 2016-04-12 DIAGNOSIS — E669 Obesity, unspecified: Secondary | ICD-10-CM

## 2016-04-12 DIAGNOSIS — Z803 Family history of malignant neoplasm of breast: Secondary | ICD-10-CM

## 2016-04-12 DIAGNOSIS — K449 Diaphragmatic hernia without obstruction or gangrene: Secondary | ICD-10-CM

## 2016-04-12 DIAGNOSIS — C182 Malignant neoplasm of ascending colon: Secondary | ICD-10-CM

## 2016-04-12 DIAGNOSIS — C799 Secondary malignant neoplasm of unspecified site: Secondary | ICD-10-CM | POA: Diagnosis not present

## 2016-04-12 DIAGNOSIS — R197 Diarrhea, unspecified: Secondary | ICD-10-CM

## 2016-04-12 DIAGNOSIS — C7B8 Other secondary neuroendocrine tumors: Secondary | ICD-10-CM

## 2016-04-12 DIAGNOSIS — C189 Malignant neoplasm of colon, unspecified: Secondary | ICD-10-CM

## 2016-04-12 DIAGNOSIS — E876 Hypokalemia: Secondary | ICD-10-CM | POA: Diagnosis not present

## 2016-04-12 DIAGNOSIS — C7A8 Other malignant neuroendocrine tumors: Secondary | ICD-10-CM | POA: Diagnosis not present

## 2016-04-12 DIAGNOSIS — R110 Nausea: Secondary | ICD-10-CM | POA: Diagnosis not present

## 2016-04-12 DIAGNOSIS — C7A1 Malignant poorly differentiated neuroendocrine tumors: Secondary | ICD-10-CM

## 2016-04-12 DIAGNOSIS — Z5111 Encounter for antineoplastic chemotherapy: Secondary | ICD-10-CM | POA: Diagnosis not present

## 2016-04-12 DIAGNOSIS — I10 Essential (primary) hypertension: Secondary | ICD-10-CM

## 2016-04-12 DIAGNOSIS — D179 Benign lipomatous neoplasm, unspecified: Secondary | ICD-10-CM

## 2016-04-12 DIAGNOSIS — N289 Disorder of kidney and ureter, unspecified: Secondary | ICD-10-CM

## 2016-04-12 DIAGNOSIS — R911 Solitary pulmonary nodule: Secondary | ICD-10-CM

## 2016-04-12 LAB — URINALYSIS COMPLETE WITH MICROSCOPIC (ARMC ONLY)
BACTERIA UA: NONE SEEN
Bilirubin Urine: NEGATIVE
GLUCOSE, UA: NEGATIVE mg/dL
Ketones, ur: NEGATIVE mg/dL
Leukocytes, UA: NEGATIVE
NITRITE: NEGATIVE
PROTEIN: NEGATIVE mg/dL
Specific Gravity, Urine: 1.005 (ref 1.005–1.030)
pH: 6 (ref 5.0–8.0)

## 2016-04-12 LAB — CBC WITH DIFFERENTIAL/PLATELET
BASOS ABS: 0 10*3/uL (ref 0–0.1)
Basophils Relative: 1 %
Eosinophils Absolute: 0.1 10*3/uL (ref 0–0.7)
Eosinophils Relative: 3 %
HCT: 31.3 % — ABNORMAL LOW (ref 35.0–47.0)
HEMOGLOBIN: 10.8 g/dL — AB (ref 12.0–16.0)
LYMPHS ABS: 1.2 10*3/uL (ref 1.0–3.6)
LYMPHS PCT: 41 %
MCH: 33.5 pg (ref 26.0–34.0)
MCHC: 34.3 g/dL (ref 32.0–36.0)
MCV: 97.7 fL (ref 80.0–100.0)
Monocytes Absolute: 0.2 10*3/uL (ref 0.2–0.9)
Monocytes Relative: 8 %
NEUTROS PCT: 47 %
Neutro Abs: 1.4 10*3/uL (ref 1.4–6.5)
Platelets: 256 10*3/uL (ref 150–440)
RBC: 3.21 MIL/uL — AB (ref 3.80–5.20)
RDW: 19.8 % — ABNORMAL HIGH (ref 11.5–14.5)
WBC: 3 10*3/uL — AB (ref 3.6–11.0)

## 2016-04-12 LAB — COMPREHENSIVE METABOLIC PANEL
ALK PHOS: 88 U/L (ref 38–126)
ALT: 21 U/L (ref 14–54)
AST: 26 U/L (ref 15–41)
Albumin: 3.9 g/dL (ref 3.5–5.0)
Anion gap: 6 (ref 5–15)
BUN: 17 mg/dL (ref 6–20)
CALCIUM: 8.9 mg/dL (ref 8.9–10.3)
CO2: 24 mmol/L (ref 22–32)
CREATININE: 0.87 mg/dL (ref 0.44–1.00)
Chloride: 105 mmol/L (ref 101–111)
GFR calc non Af Amer: >60 mL/min (ref >60)
GLUCOSE: 75 mg/dL (ref 65–99)
Potassium: 3.8 mmol/L (ref 3.5–5.1)
SODIUM: 135 mmol/L (ref 135–145)
Total Bilirubin: 0.6 mg/dL (ref 0.3–1.2)
Total Protein: 8 g/dL (ref 6.5–8.1)

## 2016-04-12 LAB — MAGNESIUM: Magnesium: 1.9 mg/dL (ref 1.7–2.4)

## 2016-04-12 MED ORDER — IRINOTECAN HCL CHEMO INJECTION 100 MG/5ML
140.0000 mg/m2 | Freq: Once | INTRAVENOUS | Status: AC
  Administered 2016-04-12: 280 mg via INTRAVENOUS
  Filled 2016-04-12: qty 14

## 2016-04-12 MED ORDER — SODIUM CHLORIDE 0.9 % IV SOLN: 10.0000 mg | Freq: Once | INTRAVENOUS | Status: DC

## 2016-04-12 MED ORDER — FOSAPREPITANT DIMEGLUMINE INJECTION 150 MG
Freq: Once | INTRAVENOUS | Status: AC
  Administered 2016-04-12: 11:00:00 via INTRAVENOUS
  Filled 2016-04-12: qty 5

## 2016-04-12 MED ORDER — LEUCOVORIN CALCIUM INJECTION 350 MG
800.0000 mg | Freq: Once | INTRAVENOUS | Status: AC
  Administered 2016-04-12: 800 mg via INTRAVENOUS
  Filled 2016-04-12: qty 35

## 2016-04-12 MED ORDER — SODIUM CHLORIDE 0.9 % IV SOLN
2400.0000 mg/m2 | INTRAVENOUS | Status: DC
  Administered 2016-04-12: 4750 mg via INTRAVENOUS
  Filled 2016-04-12: qty 95

## 2016-04-12 MED ORDER — ATROPINE SULFATE 1 MG/ML IJ SOLN
0.5000 mg | Freq: Once | INTRAMUSCULAR | Status: AC | PRN
  Administered 2016-04-12: 0.5 mg via INTRAVENOUS
  Filled 2016-04-12: qty 1

## 2016-04-12 MED ORDER — PALONOSETRON HCL INJECTION 0.25 MG/5ML
0.2500 mg | Freq: Once | INTRAVENOUS | Status: AC
  Administered 2016-04-12: 0.25 mg via INTRAVENOUS
  Filled 2016-04-12: qty 5

## 2016-04-12 MED ORDER — LORAZEPAM 2 MG/ML IJ SOLN
1.0000 mg | Freq: Once | INTRAMUSCULAR | Status: AC
  Administered 2016-04-12: 1 mg via INTRAVENOUS
  Filled 2016-04-12: qty 1

## 2016-04-12 MED ORDER — SODIUM CHLORIDE 0.9 % IV SOLN
Freq: Once | INTRAVENOUS | Status: AC
  Administered 2016-04-12: 11:00:00 via INTRAVENOUS
  Filled 2016-04-12: qty 1000

## 2016-04-12 MED ORDER — SODIUM CHLORIDE 0.9 % IV SOLN
400.0000 mg | Freq: Once | INTRAVENOUS | Status: AC
  Administered 2016-04-12: 400 mg via INTRAVENOUS
  Filled 2016-04-12: qty 16

## 2016-04-12 MED ORDER — SODIUM CHLORIDE 0.9% FLUSH
10.0000 mL | INTRAVENOUS | Status: DC | PRN
  Administered 2016-04-12: 10 mL
  Filled 2016-04-12: qty 10

## 2016-04-12 NOTE — Progress Notes (Signed)
[place] NOTE  [name], [name], [name], [name], [name]; [name]; [name]; [name]; [name]; [name]; [name]; [name]; July 14th  2017 CT- PR  # AUG 28th- FOLFIRI +Avastin; OCT 5th CT scan- PR.   # right kidney lesion 2.6 x 1.6 cm ? 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  Patient's nausea vomiting has significantly improved- with the addition of emend and Ativan. Diarrhea also improved.  Otherwise no weight loss.  Patient's fatigue is improved. No cramping in the legs no tingling and numbness. Denies any skin rash. No fevers or chills. No sores in the mouth. No chest pain or shortness of the cough.  ROS: A complete 10 point review of system is done which is negative except mentioned above in history of present illness  MEDICAL HISTORY:  Past Medical History:  Diagnosis Date  . Angiolipoma of kidney    followed with serial CTs ,,  Dr. Jacqlyn Larsen  . Arthritis   . Cancer ([place])    colon  . Chemotherapy induced  diarrhea   . Hammer toe   . Hypertension   . Neuro-endocrine carcinoma ([place])   . Obesity (BMI 30-39.9)     SURGICAL HISTORY: Past Surgical History:  Procedure Laterality Date  . ABDOMINAL HYSTERECTOMY  1995   endometriosis, heavy bleeding  . BUNIONECTOMY Bilateral   . CESAREAN SECTION  1985  . COLONOSCOPY WITH PROPOFOL N/A 10/09/2015   Procedure: COLONOSCOPY WITH PROPOFOL;  Surgeon: Robert Bellow, MD;  Location: [facility] ENDOSCOPY;  Service: Endoscopy;  Laterality: N/A;  . OOPHORECTOMY    . PORTACATH PLACEMENT Left 10/01/2015   Procedure: INSERTION PORT-A-CATH;  Surgeon: Robert Bellow, MD;  Location: [facility] ORS;  Service: General;  Laterality: Left;  . ROTATOR CUFF REPAIR Right 2008   right shoulder.  [place]     SOCIAL HISTORY: She lives at home with her family in [place]. 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 Social History   Social History  . Marital status: Married    Spouse name: N/A  . Number of children: N/A  . Years of education: N/A   Occupational History  . Not on file.   Social History Main Topics  . Smoking status: Never Smoker  . Smokeless tobacco: Never Used  . Alcohol use 0.0 oz/week     Comment: occasionally  . Drug use: No  . Sexual activity: Not Currently   Other Topics Concern  . Not on file   Social  History Narrative  . No narrative on file    FAMILY HISTORY: Family History  Problem Relation Age of Onset  . Mental illness Mother 23    bipolar, dementia  . Cancer Maternal Aunt 70    breast ca  . Hypertension Father   . Stroke Father 4    deceased  . COPD Father   . Heart disease Sister     deceased    ALLERGIES:  is allergic to bee venom and morphine and related.  MEDICATIONS:  Current Outpatient Prescriptions  Medication Sig Dispense Refill  . amLODipine (NORVASC) 5 MG tablet TAKE ONE TABLET BY MOUTH DAILY 30 tablet 3  . diphenoxylate-atropine (LOMOTIL) 2.5-0.025 MG tablet Take 1  tablet by mouth 4 (four) times daily as needed for diarrhea or loose stools. 30 tablet 6  . lidocaine-prilocaine (EMLA) cream Apply 1 application topically as needed. Apply to port a cath site 1 hour prior to chemotherapy treatments 30 g 6  . ondansetron (ZOFRAN) 8 MG tablet Take 1 tablet (8 mg total) by mouth every 6 (six) hours as needed for nausea or vomiting. 30 tablet 3  . potassium chloride (K-DUR) 10 MEQ tablet Take 1 tablet (10 mEq total) by mouth 2 (two) times daily. 60 tablet 3  . prochlorperazine (COMPAZINE) 10 MG tablet Take 1 tablet (10 mg total) by mouth every 6 (six) hours as needed for nausea or vomiting. 30 tablet 6  . ALPRAZolam (XANAX) 0.25 MG tablet Take 1 tablet (0.25 mg total) by mouth 2 (two) times daily as needed for anxiety or sleep. 60 tablet 3  . olmesartan (BENICAR) 40 MG tablet Take 1 tablet (40 mg total) by mouth daily. 30 tablet 2   No current facility-administered medications for this visit.    Facility-Administered Medications Ordered in Other Visits  Medication Dose Route Frequency Provider Last Rate Last Dose  . heparin lock flush 100 unit/mL  500 Units Intravenous Once [name], MD      . sodium chloride flush (NS) 0.9 % injection 10 mL  10 mL Intravenous PRN [name], MD   10 mL at 11/24/15 0836      .  PHYSICAL EXAMINATION: ECOG PERFORMANCE STATUS: 0 - Asymptomatic  Vitals:   04/12/16 0955  BP: (!) 136/91  Pulse: 84  Resp: 18  Temp: 97.8 F (36.6 C)   Filed Weights   04/12/16 0955  Weight: 189 lb (85.7 kg)    GENERAL: Well-nourished well-developed; Alert, no distress and comfortable. She is with daughter.  EYES: no pallor or icterus OROPHARYNX: no thrush or ulceration; good dentition  NECK: supple, no masses felt LYMPH:  no palpable lymphadenopathy in the cervical, axillary or inguinal regions LUNGS: clear to auscultation and  No wheeze or crackles HEART/CVS: regular rate & rhythm and no murmurs; No lower extremity  edema ABDOMEN: abdomen soft, non-tender and normal bowel sounds; positive for hepatomegaly. Musculoskeletal:no cyanosis of digits and no clubbing  PSYCH: alert & oriented x 3 with fluent speech NEURO: no focal motor/sensory deficits SKIN:  no rashes or significant lesions; Mediport is in place. Positive for alopecia.  LABORATORY DATA:  I have reviewed the data as listed Lab Results  Component Value Date   WBC 3.0 (L) 04/12/2016   HGB 10.8 (L) 04/12/2016   HCT 31.3 (L) 04/12/2016   MCV 97.7 04/12/2016   PLT 256 04/12/2016    Recent Labs  03/15/16 0854  03/29/16 0906 04/05/16 1130 04/12/16 0911  NA 136  < >  137 135 135  K 3.9  < > 4.1 4.2 3.8  CL 108  < > 106 104 105  CO2 23  < > 26 25 24   GLUCOSE 90  < > 87 94 75  BUN 15  < > 15 17 17   CREATININE 0.93  < > 0.98 0.99 0.87  CALCIUM 9.0  < > 9.3 9.0 8.9  GFRNONAA >60  < > 59* 59* >60  GFRAA >60  < > >60 >60 >60  PROT 8.0  --  8.1  --  8.0  ALBUMIN 3.8  --  3.7  --  3.9  AST 26  --  26  --  26  ALT 24  --  24  --  21  ALKPHOS 84  --  79  --  88  BILITOT 0.4  --  0.6  --  0.6  < > = values in this interval not displayed.  RADIOGRAPHIC STUDIES: I have personally reviewed the radiological images as listed and agreed with the findings in the report. Ct Abdomen Pelvis W Contrast  Result Date: 03/25/2016 CLINICAL DATA:  Metastatic ascending colon cancer presenting for restaging status post chemotherapy. EXAM: CT ABDOMEN AND PELVIS WITH CONTRAST TECHNIQUE: Multidetector CT imaging of the abdomen and pelvis was performed using the standard protocol following bolus administration of intravenous contrast. CONTRAST:  172mL ISOVUE-300 IOPAMIDOL (ISOVUE-300) INJECTION 61% COMPARISON:  01/02/2016 CT abdomen/ pelvis. FINDINGS: Lower chest: Subpleural 4 mm perifissural right pulmonary nodule (series 4/ image 6) is stable back to the 05/25/2010 and considered benign. Hepatobiliary: There are at least 6 hypodense liver masses scattered  throughout the liver, which are all decreased in size, with representative masses as follows: - superior right liver lobe 5.2 x 3.7 cm mass (series 2/image 17), previously 7.7 x 4.9 cm, decreased - segment 4A left liver lobe 1.3 x 1.2 cm mass (series 2/image 20), previously 2.3 x 2.2 cm, decreased - right liver dome 1.2 x 1.0 cm mass (series 2/image 10), previously 2.0 x 1.8 cm, decreased No new liver masses. Small simple liver cysts scattered in the liver are stable measuring up to 1.3 cm in the lateral segment left liver lobe. Stable probable hemangioma in the far inferior right liver lobe measuring 1.7 cm. Normal gallbladder with no radiopaque cholelithiasis. No biliary ductal dilatation. Pancreas: Normal, with no mass or duct dilation. Spleen: Normal size. No mass. Adrenals/Urinary Tract: Normal adrenals. Hypodense 2.5 x 1.4 cm posterior lower right renal mass (series 2/ image 34) is stable in the interval, contains tiny internal fat density foci, and is mildly enlarged from 1.7 x 1.3 cm compared to the remote 05/25/2010 CT study, consistent with a renal angiomyolipoma. No new renal lesions. No hydronephrosis. Normal bladder. Stomach/Bowel: Small hiatal hernia. Otherwise grossly normal stomach. Normal caliber small bowel with no small bowel wall thickening. Normal appendix . Persistent focal wall thickening in the medial ascending colon just above the ileocecal valve (series 2/ image 42), not appreciably changed, consistent with primary colonic carcinoma. Vascular/Lymphatic: Atherosclerotic nonaneurysmal abdominal aorta. Patent portal, splenic, hepatic and renal veins. There are several clustered enlarged right mesenteric nodes, which have decreased in size. For example, a 2.0 cm right mesenteric node (series 2/ image 46) is decreased from 2.4 cm. No additional pathologically enlarged abdominopelvic nodes. Reproductive: Status post hysterectomy, with no abnormal findings at the vaginal cuff. No adnexal mass.  Other: No pneumoperitoneum, ascites or focal fluid collection. Musculoskeletal: No aggressive appearing focal osseous lesions. Moderate thoracolumbar spondylosis. IMPRESSION: 1.  Partial treatment response. Liver metastases are decreased in size. Right mesenteric nodal metastases are decreased in size. No new or progressive metastatic disease in the abdomen or pelvis. 2. Stable eccentric wall thickening in the medial ascending colon above the ileocecal valve, consistent with primary colonic carcinoma. 3. Additional findings include stable lower right renal angiomyolipoma, small hiatal hernia and aortic atherosclerosis. Electronically Signed   By: Ilona Sorrel M.D.   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   # Clinically no evidence of progression noted.; Improved/CEA- trending down [117]  # proceed with FOLFIRI + Avastin cycle # 5  today.  S/p IR referral; will plan at progression.  # intractable nausea/vomitting/abdominal cramping G-2; add emend; ativan; decreased iri- by 20%. Significant improvement noted.   # Blood pressure Elevation- sec to Avastin- improved;  cont benicar/hctx; and  Norvasc as needed.   #  follow-up with me in 2 weeks/ chemo/CEA.     [name], MD 04/15/2016 5:41 PM

## 2016-04-12 NOTE — Progress Notes (Signed)
ANC 1.4  MD ok with treatment.

## 2016-04-12 NOTE — Assessment & Plan Note (Signed)
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   # Clinically no evidence of progression noted.; Improved/CEA- trending down [117]  # proceed with FOLFIRI + Avastin cycle # 5  today.  S/p IR referral; will plan at progression.  # intractable nausea/vomitting/abdominal cramping G-2; add emend; ativan; decreased iri- by 20%. Significant improvement noted.   # Blood pressure Elevation- sec to Avastin- improved;  cont benicar/hctx; and  Norvasc as needed.   #  follow-up with me in 2 weeks/ chemo/CEA.

## 2016-04-12 NOTE — Progress Notes (Signed)
Patient is here for follow up, no complaint today

## 2016-04-14 ENCOUNTER — Telehealth: Payer: Self-pay | Admitting: Internal Medicine

## 2016-04-14 ENCOUNTER — Inpatient Hospital Stay: Payer: Medicare HMO

## 2016-04-14 DIAGNOSIS — C189 Malignant neoplasm of colon, unspecified: Secondary | ICD-10-CM

## 2016-04-14 DIAGNOSIS — C182 Malignant neoplasm of ascending colon: Secondary | ICD-10-CM

## 2016-04-14 DIAGNOSIS — C801 Malignant (primary) neoplasm, unspecified: Secondary | ICD-10-CM

## 2016-04-14 DIAGNOSIS — C799 Secondary malignant neoplasm of unspecified site: Secondary | ICD-10-CM

## 2016-04-14 DIAGNOSIS — Z5111 Encounter for antineoplastic chemotherapy: Secondary | ICD-10-CM

## 2016-04-14 DIAGNOSIS — C7A8 Other malignant neuroendocrine tumors: Secondary | ICD-10-CM

## 2016-04-14 MED ORDER — SODIUM CHLORIDE 0.9 % IJ SOLN
10.0000 mL | Freq: Once | INTRAMUSCULAR | Status: AC
  Administered 2016-04-14: 10 mL via INTRAVENOUS
  Filled 2016-04-14: qty 10

## 2016-04-14 MED ORDER — HEPARIN SOD (PORK) LOCK FLUSH 100 UNIT/ML IV SOLN
INTRAVENOUS | Status: AC
  Filled 2016-04-14: qty 5

## 2016-04-14 MED ORDER — OLMESARTAN MEDOXOMIL 40 MG PO TABS: 40.0000 mg | ORAL_TABLET | Freq: Every day | ORAL | 2 refills | Status: DC

## 2016-04-14 MED ORDER — HEPARIN SOD (PORK) LOCK FLUSH 100 UNIT/ML IV SOLN
500.0000 [IU] | Freq: Once | INTRAVENOUS | Status: AC
  Administered 2016-04-14: 500 [IU] via INTRAVENOUS

## 2016-04-14 NOTE — Telephone Encounter (Signed)
Pt called and stated that Dr. Derrel Nip had prescribed olmesartan-hydrochlorothiazide (BENICAR HCT) 40-12.5 MG tablet and stated that she took this along time ago and has been recently taking olmesartan (BENICAR) 40 MG tablet. Pt would like to have olmesartan (BENICAR) 40 MG tablet refilled. Thank you!  [place], [place]  Call pt @ [phone]

## 2016-04-14 NOTE — Telephone Encounter (Signed)
Refill sent patient notified  

## 2016-04-15 ENCOUNTER — Other Ambulatory Visit: Payer: Self-pay

## 2016-04-15 MED ORDER — ALPRAZOLAM 0.25 MG PO TABS: 0.2500 mg | ORAL_TABLET | Freq: Two times a day (BID) | ORAL | 3 refills | Status: DC | PRN

## 2016-04-15 NOTE — Telephone Encounter (Signed)
Cancer patient . Ok to refill without OV

## 2016-04-15 NOTE — Telephone Encounter (Signed)
Last filled 03/09/2016 Last office 08/05/15 chronic

## 2016-04-26 ENCOUNTER — Inpatient Hospital Stay: Payer: Medicare HMO

## 2016-04-26 ENCOUNTER — Inpatient Hospital Stay: Payer: Medicare HMO | Attending: Internal Medicine

## 2016-04-26 ENCOUNTER — Inpatient Hospital Stay (HOSPITAL_BASED_OUTPATIENT_CLINIC_OR_DEPARTMENT_OTHER): Payer: Medicare HMO | Admitting: Internal Medicine

## 2016-04-26 VITALS — BP 125/84 | HR 83 | Resp 18

## 2016-04-26 VITALS — BP 141/94 | HR 76 | Temp 97.1°F | Resp 18 | Wt 193.6 lb

## 2016-04-26 DIAGNOSIS — Z803 Family history of malignant neoplasm of breast: Secondary | ICD-10-CM

## 2016-04-26 DIAGNOSIS — M1990 Unspecified osteoarthritis, unspecified site: Secondary | ICD-10-CM

## 2016-04-26 DIAGNOSIS — Z5112 Encounter for antineoplastic immunotherapy: Secondary | ICD-10-CM | POA: Insufficient documentation

## 2016-04-26 DIAGNOSIS — C7A1 Malignant poorly differentiated neuroendocrine tumors: Secondary | ICD-10-CM | POA: Diagnosis not present

## 2016-04-26 DIAGNOSIS — Z79899 Other long term (current) drug therapy: Secondary | ICD-10-CM | POA: Diagnosis not present

## 2016-04-26 DIAGNOSIS — R112 Nausea with vomiting, unspecified: Secondary | ICD-10-CM | POA: Insufficient documentation

## 2016-04-26 DIAGNOSIS — C7A8 Other malignant neuroendocrine tumors: Secondary | ICD-10-CM

## 2016-04-26 DIAGNOSIS — I10 Essential (primary) hypertension: Secondary | ICD-10-CM

## 2016-04-26 DIAGNOSIS — R5383 Other fatigue: Secondary | ICD-10-CM | POA: Diagnosis not present

## 2016-04-26 DIAGNOSIS — C7B8 Other secondary neuroendocrine tumors: Secondary | ICD-10-CM | POA: Insufficient documentation

## 2016-04-26 DIAGNOSIS — Z5111 Encounter for antineoplastic chemotherapy: Secondary | ICD-10-CM

## 2016-04-26 DIAGNOSIS — R109 Unspecified abdominal pain: Secondary | ICD-10-CM | POA: Insufficient documentation

## 2016-04-26 DIAGNOSIS — C182 Malignant neoplasm of ascending colon: Secondary | ICD-10-CM

## 2016-04-26 DIAGNOSIS — C189 Malignant neoplasm of colon, unspecified: Secondary | ICD-10-CM

## 2016-04-26 DIAGNOSIS — C799 Secondary malignant neoplasm of unspecified site: Secondary | ICD-10-CM

## 2016-04-26 DIAGNOSIS — R197 Diarrhea, unspecified: Secondary | ICD-10-CM | POA: Diagnosis not present

## 2016-04-26 LAB — CBC WITH DIFFERENTIAL/PLATELET
Basophils Absolute: 0 10*3/uL (ref 0–0.1)
Basophils Relative: 1 %
EOS PCT: 4 %
Eosinophils Absolute: 0.1 10*3/uL (ref 0–0.7)
HCT: 30.6 % — ABNORMAL LOW (ref 35.0–47.0)
Hemoglobin: 10.9 g/dL — ABNORMAL LOW (ref 12.0–16.0)
LYMPHS ABS: 1.4 10*3/uL (ref 1.0–3.6)
LYMPHS PCT: 37 %
MCH: 35.4 pg — AB (ref 26.0–34.0)
MCHC: 35.6 g/dL (ref 32.0–36.0)
MCV: 99.4 fL (ref 80.0–100.0)
MONO ABS: 0.3 10*3/uL (ref 0.2–0.9)
Monocytes Relative: 8 %
Neutro Abs: 1.9 10*3/uL (ref 1.4–6.5)
Neutrophils Relative %: 50 %
PLATELETS: 248 10*3/uL (ref 150–440)
RBC: 3.08 MIL/uL — ABNORMAL LOW (ref 3.80–5.20)
RDW: 17.7 % — AB (ref 11.5–14.5)
WBC: 3.8 10*3/uL (ref 3.6–11.0)

## 2016-04-26 LAB — COMPREHENSIVE METABOLIC PANEL
ALT: 18 U/L (ref 14–54)
AST: 25 U/L (ref 15–41)
Albumin: 3.6 g/dL (ref 3.5–5.0)
Alkaline Phosphatase: 87 U/L (ref 38–126)
Anion gap: 5 (ref 5–15)
BUN: 17 mg/dL (ref 6–20)
CHLORIDE: 107 mmol/L (ref 101–111)
CO2: 25 mmol/L (ref 22–32)
CREATININE: 0.92 mg/dL (ref 0.44–1.00)
Calcium: 9 mg/dL (ref 8.9–10.3)
Glucose, Bld: 89 mg/dL (ref 65–99)
POTASSIUM: 4 mmol/L (ref 3.5–5.1)
Sodium: 137 mmol/L (ref 135–145)
TOTAL PROTEIN: 7.5 g/dL (ref 6.5–8.1)
Total Bilirubin: 0.3 mg/dL (ref 0.3–1.2)

## 2016-04-26 LAB — URINALYSIS COMPLETE WITH MICROSCOPIC (ARMC ONLY)
BILIRUBIN URINE: NEGATIVE
Bacteria, UA: NONE SEEN
GLUCOSE, UA: NEGATIVE mg/dL
Hgb urine dipstick: NEGATIVE
KETONES UR: NEGATIVE mg/dL
Leukocytes, UA: NEGATIVE
NITRITE: NEGATIVE
PROTEIN: NEGATIVE mg/dL
SPECIFIC GRAVITY, URINE: 1.008 (ref 1.005–1.030)
Squamous Epithelial / LPF: NONE SEEN
pH: 6 (ref 5.0–8.0)

## 2016-04-26 LAB — MAGNESIUM: Magnesium: 1.8 mg/dL (ref 1.7–2.4)

## 2016-04-26 MED ORDER — DEXAMETHASONE SODIUM PHOSPHATE 10 MG/ML IJ SOLN
10.0000 mg | Freq: Once | INTRAMUSCULAR | Status: DC
  Filled 2016-04-26: qty 1

## 2016-04-26 MED ORDER — ATROPINE SULFATE 1 MG/ML IJ SOLN
0.5000 mg | Freq: Once | INTRAMUSCULAR | Status: AC | PRN
  Administered 2016-04-26: 0.5 mg via INTRAVENOUS
  Filled 2016-04-26: qty 1

## 2016-04-26 MED ORDER — SODIUM CHLORIDE 0.9 % IV SOLN
2400.0000 mg/m2 | INTRAVENOUS | Status: DC
  Administered 2016-04-26: 4750 mg via INTRAVENOUS
  Filled 2016-04-26: qty 95

## 2016-04-26 MED ORDER — DEXTROSE 5 % IV SOLN
140.0000 mg/m2 | Freq: Once | INTRAVENOUS | Status: AC
  Administered 2016-04-26: 280 mg via INTRAVENOUS
  Filled 2016-04-26: qty 14

## 2016-04-26 MED ORDER — SODIUM CHLORIDE 0.9 % IV SOLN
400.0000 mg | Freq: Once | INTRAVENOUS | Status: AC
  Administered 2016-04-26: 400 mg via INTRAVENOUS
  Filled 2016-04-26: qty 16

## 2016-04-26 MED ORDER — SODIUM CHLORIDE 0.9% FLUSH
10.0000 mL | INTRAVENOUS | Status: DC | PRN
  Administered 2016-04-26: 10 mL
  Filled 2016-04-26: qty 10

## 2016-04-26 MED ORDER — PALONOSETRON HCL INJECTION 0.25 MG/5ML
0.2500 mg | Freq: Once | INTRAVENOUS | Status: AC
  Administered 2016-04-26: 0.25 mg via INTRAVENOUS
  Filled 2016-04-26: qty 5

## 2016-04-26 MED ORDER — SODIUM CHLORIDE 0.9 % IV SOLN: 10.0000 mg | Freq: Once | INTRAVENOUS | Status: DC

## 2016-04-26 MED ORDER — LORAZEPAM 2 MG/ML IJ SOLN
1.0000 mg | Freq: Once | INTRAMUSCULAR | Status: AC
  Administered 2016-04-26: 1 mg via INTRAVENOUS
  Filled 2016-04-26: qty 1

## 2016-04-26 MED ORDER — SODIUM CHLORIDE 0.9 % IV SOLN
Freq: Once | INTRAVENOUS | Status: AC
  Administered 2016-04-26: 11:00:00 via INTRAVENOUS
  Filled 2016-04-26: qty 1000

## 2016-04-26 MED ORDER — SODIUM CHLORIDE 0.9 % IV SOLN
Freq: Once | INTRAVENOUS | Status: AC
  Administered 2016-04-26: 11:00:00 via INTRAVENOUS
  Filled 2016-04-26: qty 5

## 2016-04-26 MED ORDER — LEUCOVORIN CALCIUM INJECTION 350 MG
800.0000 mg | Freq: Once | INTRAVENOUS | Status: AC
  Administered 2016-04-26: 800 mg via INTRAVENOUS
  Filled 2016-04-26: qty 40

## 2016-04-26 NOTE — Assessment & Plan Note (Addendum)
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 S/p 5 appx  2 weeks ago. Clinically no evidence of progression noted.; Improved/CEA- trending down [117]; will check today.   # proceed with FOLFIRI + Avastin cycle # 6  today.    # intractable nausea/vomitting/abdominal cramping G-2; add emend; ativan; decreased iri- by 20%. Significant improvement noted.   # Blood pressure Elevation- sec to Avastin- improved;  cont benicar/hctx; and  Norvasc as needed. Stable at home.   #  follow-up with me in 2 weeks/ chemo/UA/labs.

## 2016-04-26 NOTE — Progress Notes (Signed)
[place] NOTE  [name], [name], [name], [name], [name]; [name]; [name]; [name]; [name]; [name]; [name]; [name]; July 14th  2017 CT- PR  # AUG 28th- FOLFIRI +Avastin; OCT 5th CT scan- PR.   # right kidney lesion 2.6 x 1.6 cm ? 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  Denies any nausea vomiting. Diarrhea also improved. Positive for weight gain. Blood pressures have been steady at home. Denies any significant headaches or epistaxis.  Patient's fatigue is improved. No cramping in the legs no tingling and numbness. Denies any skin rash. No fevers or chills. No sores in the mouth. No chest pain or shortness of the cough.  ROS: A complete 10 point review of system is done which is negative except mentioned above in history of present illness  MEDICAL HISTORY:  Past Medical History:  Diagnosis Date  . Angiolipoma of kidney    followed with serial CTs ,,  Dr. Jacqlyn Larsen  . Arthritis   . Cancer ([place])    colon  .  Chemotherapy induced diarrhea   . Hammer toe   . Hypertension   . Neuro-endocrine carcinoma ([place])   . Obesity (BMI 30-39.9)     SURGICAL HISTORY: Past Surgical History:  Procedure Laterality Date  . ABDOMINAL HYSTERECTOMY  1995   endometriosis, heavy bleeding  . BUNIONECTOMY Bilateral   . CESAREAN SECTION  1985  . COLONOSCOPY WITH PROPOFOL N/A 10/09/2015   Procedure: COLONOSCOPY WITH PROPOFOL;  Surgeon: Robert Bellow, MD;  Location: [facility] ENDOSCOPY;  Service: Endoscopy;  Laterality: N/A;  . OOPHORECTOMY    . PORTACATH PLACEMENT Left 10/01/2015   Procedure: INSERTION PORT-A-CATH;  Surgeon: Robert Bellow, MD;  Location: [facility] ORS;  Service: General;  Laterality: Left;  . ROTATOR CUFF REPAIR Right 2008   right shoulder.  [place]     SOCIAL HISTORY: She lives at home with her family in [place]. 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 Social History   Social History  . Marital status: Married    Spouse name: N/A  . Number of children: N/A  . Years of education: N/A   Occupational History  . Not on file.   Social History Main Topics  . Smoking status: Never Smoker  . Smokeless tobacco: Never Used  . Alcohol use 0.0 oz/week     Comment: occasionally  . Drug use: No  . Sexual activity: Not Currently   Other Topics Concern  . Not on file  Social History Narrative  . No narrative on file    FAMILY HISTORY: Family History  Problem Relation Age of Onset  . Mental illness Mother 50    bipolar, dementia  . Cancer Maternal Aunt 70    breast ca  . Hypertension Father   . Stroke Father 21    deceased  . COPD Father   . Heart disease Sister     deceased    ALLERGIES:  is allergic to bee venom and morphine and related.  MEDICATIONS:  Current Outpatient Prescriptions  Medication Sig Dispense Refill  . ALPRAZolam (XANAX) 0.25 MG tablet Take 1 tablet (0.25 mg total) by mouth 2 (two) times daily as needed for anxiety or  sleep. 60 tablet 3  . amLODipine (NORVASC) 5 MG tablet TAKE ONE TABLET BY MOUTH DAILY 30 tablet 3  . diphenoxylate-atropine (LOMOTIL) 2.5-0.025 MG tablet Take 1 tablet by mouth 4 (four) times daily as needed for diarrhea or loose stools. 30 tablet 6  . lidocaine-prilocaine (EMLA) cream Apply 1 application topically as needed. Apply to port a cath site 1 hour prior to chemotherapy treatments 30 g 6  . Multiple Vitamins-Minerals (MULTIVITAMIN ADULT PO) Take by mouth.    . olmesartan (BENICAR) 40 MG tablet Take 1 tablet (40 mg total) by mouth daily. 30 tablet 2  . ondansetron (ZOFRAN) 8 MG tablet Take 1 tablet (8 mg total) by mouth every 6 (six) hours as needed for nausea or vomiting. 30 tablet 3  . potassium chloride (K-DUR) 10 MEQ tablet Take 1 tablet (10 mEq total) by mouth 2 (two) times daily. 60 tablet 3  . prochlorperazine (COMPAZINE) 10 MG tablet Take 1 tablet (10 mg total) by mouth every 6 (six) hours as needed for nausea or vomiting. 30 tablet 6   No current facility-administered medications for this visit.    Facility-Administered Medications Ordered in Other Visits  Medication Dose Route Frequency Provider Last Rate Last Dose  . heparin lock flush 100 unit/mL  500 Units Intravenous Once [name], MD      . sodium chloride flush (NS) 0.9 % injection 10 mL  10 mL Intravenous PRN [name], MD   10 mL at 11/24/15 0836      .  PHYSICAL EXAMINATION: ECOG PERFORMANCE STATUS: 0 - Asymptomatic  Vitals:   04/26/16 0941  BP: (!) 141/94  Pulse: 76  Resp: 18  Temp: 97.1 F (36.2 C)   Filed Weights   04/26/16 0941  Weight: 193 lb 9.6 oz (87.8 kg)    GENERAL: Well-nourished well-developed; Alert, no distress and comfortable. She is with daughter.  EYES: no pallor or icterus OROPHARYNX: no thrush or ulceration; good dentition  NECK: supple, no masses felt LYMPH:  no palpable lymphadenopathy in the cervical, axillary or inguinal regions LUNGS: clear to  auscultation and  No wheeze or crackles HEART/CVS: regular rate & rhythm and no murmurs; No lower extremity edema ABDOMEN: abdomen soft, non-tender and normal bowel sounds; positive for hepatomegaly. Musculoskeletal:no cyanosis of digits and no clubbing  PSYCH: alert & oriented x 3 with fluent speech NEURO: no focal motor/sensory deficits SKIN:  no rashes or significant lesions; Mediport is in place. Positive for alopecia.  LABORATORY DATA:  I have reviewed the data as listed Lab Results  Component Value Date   WBC 3.8 04/26/2016   HGB 10.9 (L) 04/26/2016   HCT 30.6 (L) 04/26/2016   MCV 99.4 04/26/2016   PLT 248 04/26/2016    Recent Labs  03/29/16  [id number] 04/05/16 1130 04/12/16 0911 04/26/16 0916  NA 137 135 135 137  K 4.1 4.2 3.8 4.0  CL 106 104 105 107  CO2 26 25 24 25   GLUCOSE 87 94 75 89  BUN 15 17 17 17   CREATININE 0.98 0.99 0.87 0.92  CALCIUM 9.3 9.0 8.9 9.0  GFRNONAA 59* 59* >60 >60  GFRAA >60 >60 >60 >60  PROT 8.1  --  8.0 7.5  ALBUMIN 3.7  --  3.9 3.6  AST 26  --  26 25  ALT 24  --  21 18  ALKPHOS 79  --  88 87  BILITOT 0.6  --  0.6 0.3    RADIOGRAPHIC STUDIES: I have personally reviewed the radiological images as listed and agreed with the findings in the report. No results found.  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 S/p 5 appx  2 weeks ago. Clinically no evidence of progression noted.; Improved/CEA- trending down [117]; will check today.   # proceed with FOLFIRI + Avastin cycle # 6  today.    # intractable nausea/vomitting/abdominal cramping G-2; add emend; ativan; decreased iri- by 20%. Significant improvement noted.   # Blood pressure Elevation- sec to Avastin- improved;  cont benicar/hctx; and  Norvasc as needed. Stable at home.   #  follow-up with me in 2 weeks/  chemo/UA/labs.     [name], MD 04/27/2016 7:56 AM

## 2016-04-26 NOTE — Progress Notes (Signed)
Patient is here for follow up, she has no complaints  

## 2016-04-27 DIAGNOSIS — C799 Secondary malignant neoplasm of unspecified site: Secondary | ICD-10-CM | POA: Diagnosis not present

## 2016-04-27 DIAGNOSIS — C7A8 Other malignant neuroendocrine tumors: Secondary | ICD-10-CM | POA: Diagnosis not present

## 2016-04-27 LAB — CEA: CEA: 100.6 ng/mL — ABNORMAL HIGH (ref 0.0–4.7)

## 2016-04-28 ENCOUNTER — Inpatient Hospital Stay: Payer: Medicare HMO

## 2016-04-28 VITALS — BP 126/84 | HR 83 | Resp 18

## 2016-04-28 DIAGNOSIS — C7A8 Other malignant neuroendocrine tumors: Secondary | ICD-10-CM

## 2016-04-28 DIAGNOSIS — C182 Malignant neoplasm of ascending colon: Secondary | ICD-10-CM

## 2016-04-28 DIAGNOSIS — C801 Malignant (primary) neoplasm, unspecified: Secondary | ICD-10-CM

## 2016-04-28 DIAGNOSIS — Z5111 Encounter for antineoplastic chemotherapy: Secondary | ICD-10-CM

## 2016-04-28 DIAGNOSIS — C799 Secondary malignant neoplasm of unspecified site: Secondary | ICD-10-CM

## 2016-04-28 DIAGNOSIS — C189 Malignant neoplasm of colon, unspecified: Secondary | ICD-10-CM

## 2016-04-28 DIAGNOSIS — Z5112 Encounter for antineoplastic immunotherapy: Secondary | ICD-10-CM | POA: Diagnosis not present

## 2016-04-28 MED ORDER — SODIUM CHLORIDE 0.9 % IJ SOLN
10.0000 mL | Freq: Once | INTRAMUSCULAR | Status: AC
  Administered 2016-04-28: 10 mL via INTRAVENOUS
  Filled 2016-04-28: qty 10

## 2016-04-28 MED ORDER — HEPARIN SOD (PORK) LOCK FLUSH 100 UNIT/ML IV SOLN
500.0000 [IU] | Freq: Once | INTRAVENOUS | Status: AC
  Administered 2016-04-28: 500 [IU] via INTRAVENOUS

## 2016-04-30 ENCOUNTER — Telehealth: Payer: Self-pay | Admitting: Internal Medicine

## 2016-04-30 MED ORDER — OLMESARTAN MEDOXOMIL 40 MG PO TABS: 40.0000 mg | ORAL_TABLET | Freq: Every day | ORAL | 2 refills | Status: DC

## 2016-04-30 NOTE — Telephone Encounter (Signed)
Medication has been refilled.

## 2016-04-30 NOTE — Telephone Encounter (Signed)
Pt called wanting to get a refill for olmesartan (BENICAR) 40 MG tablet without the HCTZ. Pt states she doesnot need the diuretic. Pt states it was changed.  Pharmacy is [facility], [place]  Call pt @ [phone]. Thank you!

## 2016-05-10 ENCOUNTER — Inpatient Hospital Stay: Payer: Medicare HMO

## 2016-05-10 ENCOUNTER — Inpatient Hospital Stay (HOSPITAL_BASED_OUTPATIENT_CLINIC_OR_DEPARTMENT_OTHER): Payer: Medicare HMO | Admitting: Internal Medicine

## 2016-05-10 VITALS — BP 127/87 | HR 83 | Temp 97.1°F | Resp 18 | Wt 191.8 lb

## 2016-05-10 DIAGNOSIS — C7B8 Other secondary neuroendocrine tumors: Secondary | ICD-10-CM | POA: Diagnosis not present

## 2016-05-10 DIAGNOSIS — M1990 Unspecified osteoarthritis, unspecified site: Secondary | ICD-10-CM

## 2016-05-10 DIAGNOSIS — R112 Nausea with vomiting, unspecified: Secondary | ICD-10-CM | POA: Diagnosis not present

## 2016-05-10 DIAGNOSIS — I10 Essential (primary) hypertension: Secondary | ICD-10-CM

## 2016-05-10 DIAGNOSIS — C7A8 Other malignant neuroendocrine tumors: Secondary | ICD-10-CM

## 2016-05-10 DIAGNOSIS — R197 Diarrhea, unspecified: Secondary | ICD-10-CM

## 2016-05-10 DIAGNOSIS — C799 Secondary malignant neoplasm of unspecified site: Secondary | ICD-10-CM

## 2016-05-10 DIAGNOSIS — Z79899 Other long term (current) drug therapy: Secondary | ICD-10-CM

## 2016-05-10 DIAGNOSIS — C189 Malignant neoplasm of colon, unspecified: Secondary | ICD-10-CM

## 2016-05-10 DIAGNOSIS — C182 Malignant neoplasm of ascending colon: Secondary | ICD-10-CM

## 2016-05-10 DIAGNOSIS — R109 Unspecified abdominal pain: Secondary | ICD-10-CM

## 2016-05-10 DIAGNOSIS — C7A1 Malignant poorly differentiated neuroendocrine tumors: Secondary | ICD-10-CM

## 2016-05-10 DIAGNOSIS — Z5112 Encounter for antineoplastic immunotherapy: Secondary | ICD-10-CM | POA: Diagnosis not present

## 2016-05-10 DIAGNOSIS — Z803 Family history of malignant neoplasm of breast: Secondary | ICD-10-CM

## 2016-05-10 DIAGNOSIS — Z5111 Encounter for antineoplastic chemotherapy: Secondary | ICD-10-CM

## 2016-05-10 DIAGNOSIS — R5383 Other fatigue: Secondary | ICD-10-CM

## 2016-05-10 LAB — URINALYSIS COMPLETE WITH MICROSCOPIC (ARMC ONLY)
Bilirubin Urine: NEGATIVE
Glucose, UA: NEGATIVE mg/dL
KETONES UR: NEGATIVE mg/dL
Leukocytes, UA: NEGATIVE
NITRITE: NEGATIVE
PH: 5 (ref 5.0–8.0)
PROTEIN: NEGATIVE mg/dL
SPECIFIC GRAVITY, URINE: 1.02 (ref 1.005–1.030)

## 2016-05-10 LAB — COMPREHENSIVE METABOLIC PANEL
ALT: 27 U/L (ref 14–54)
AST: 29 U/L (ref 15–41)
Albumin: 3.7 g/dL (ref 3.5–5.0)
Alkaline Phosphatase: 81 U/L (ref 38–126)
Anion gap: 6 (ref 5–15)
BUN: 16 mg/dL (ref 6–20)
CHLORIDE: 105 mmol/L (ref 101–111)
CO2: 25 mmol/L (ref 22–32)
CREATININE: 0.96 mg/dL (ref 0.44–1.00)
Calcium: 8.8 mg/dL — ABNORMAL LOW (ref 8.9–10.3)
GFR calc non Af Amer: >60 mL/min (ref >60)
Glucose, Bld: 105 mg/dL — ABNORMAL HIGH (ref 65–99)
POTASSIUM: 4 mmol/L (ref 3.5–5.1)
SODIUM: 136 mmol/L (ref 135–145)
Total Bilirubin: 0.5 mg/dL (ref 0.3–1.2)
Total Protein: 7.7 g/dL (ref 6.5–8.1)

## 2016-05-10 LAB — CBC WITH DIFFERENTIAL/PLATELET
BASOS ABS: 0 10*3/uL (ref 0–0.1)
Basophils Relative: 1 %
EOS ABS: 0.1 10*3/uL (ref 0–0.7)
EOS PCT: 2 %
HCT: 30.8 % — ABNORMAL LOW (ref 35.0–47.0)
Hemoglobin: 10.7 g/dL — ABNORMAL LOW (ref 12.0–16.0)
Lymphocytes Relative: 22 %
Lymphs Abs: 1.1 10*3/uL (ref 1.0–3.6)
MCH: 34.5 pg — ABNORMAL HIGH (ref 26.0–34.0)
MCHC: 34.8 g/dL (ref 32.0–36.0)
MCV: 99 fL (ref 80.0–100.0)
MONO ABS: 0.4 10*3/uL (ref 0.2–0.9)
Monocytes Relative: 8 %
Neutro Abs: 3.5 10*3/uL (ref 1.4–6.5)
Neutrophils Relative %: 67 %
PLATELETS: 266 10*3/uL (ref 150–440)
RBC: 3.11 MIL/uL — AB (ref 3.80–5.20)
RDW: 17.5 % — AB (ref 11.5–14.5)
WBC: 5.1 10*3/uL (ref 3.6–11.0)

## 2016-05-10 LAB — MAGNESIUM: Magnesium: 1.9 mg/dL (ref 1.7–2.4)

## 2016-05-10 MED ORDER — SODIUM CHLORIDE 0.9 % IV SOLN
Freq: Once | INTRAVENOUS | Status: AC
  Administered 2016-05-10: 11:00:00 via INTRAVENOUS
  Filled 2016-05-10: qty 1000

## 2016-05-10 MED ORDER — PALONOSETRON HCL INJECTION 0.25 MG/5ML
0.2500 mg | Freq: Once | INTRAVENOUS | Status: AC
  Administered 2016-05-10: 0.25 mg via INTRAVENOUS
  Filled 2016-05-10: qty 5

## 2016-05-10 MED ORDER — SODIUM CHLORIDE 0.9 % IV SOLN
2400.0000 mg/m2 | INTRAVENOUS | Status: DC
  Administered 2016-05-10: 4750 mg via INTRAVENOUS
  Filled 2016-05-10: qty 95

## 2016-05-10 MED ORDER — IRINOTECAN HCL CHEMO INJECTION 100 MG/5ML
140.0000 mg/m2 | Freq: Once | INTRAVENOUS | Status: AC
  Administered 2016-05-10: 280 mg via INTRAVENOUS
  Filled 2016-05-10: qty 14

## 2016-05-10 MED ORDER — SODIUM CHLORIDE 0.9 % IV SOLN
Freq: Once | INTRAVENOUS | Status: AC
  Administered 2016-05-10: 12:00:00 via INTRAVENOUS
  Filled 2016-05-10: qty 5

## 2016-05-10 MED ORDER — DEXTROSE 5 % IV SOLN
800.0000 mg | Freq: Once | INTRAVENOUS | Status: AC
  Administered 2016-05-10: 800 mg via INTRAVENOUS
  Filled 2016-05-10: qty 40

## 2016-05-10 MED ORDER — ATROPINE SULFATE 1 MG/ML IJ SOLN
0.5000 mg | Freq: Once | INTRAMUSCULAR | Status: AC | PRN
  Administered 2016-05-10: 0.5 mg via INTRAVENOUS
  Filled 2016-05-10: qty 1

## 2016-05-10 MED ORDER — SODIUM CHLORIDE 0.9 % IV SOLN
400.0000 mg | Freq: Once | INTRAVENOUS | Status: AC
  Administered 2016-05-10: 400 mg via INTRAVENOUS
  Filled 2016-05-10: qty 16

## 2016-05-10 MED ORDER — HEPARIN SOD (PORK) LOCK FLUSH 100 UNIT/ML IV SOLN
500.0000 [IU] | Freq: Once | INTRAVENOUS | Status: DC
Start: 2016-05-10 — End: 2016-05-10
  Filled 2016-05-10: qty 5

## 2016-05-10 MED ORDER — SODIUM CHLORIDE 0.9 % IJ SOLN
10.0000 mL | Freq: Once | INTRAMUSCULAR | Status: AC
  Administered 2016-05-10: 10 mL via INTRAVENOUS
  Filled 2016-05-10: qty 10

## 2016-05-10 MED ORDER — DEXAMETHASONE SODIUM PHOSPHATE 100 MG/10ML IJ SOLN: 10.0000 mg | Freq: Once | INTRAMUSCULAR | Status: DC

## 2016-05-10 MED ORDER — LORAZEPAM 2 MG/ML IJ SOLN
1.0000 mg | Freq: Once | INTRAMUSCULAR | Status: AC
  Administered 2016-05-10: 1 mg via INTRAVENOUS
  Filled 2016-05-10: qty 1

## 2016-05-10 NOTE — Assessment & Plan Note (Addendum)
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 S/p 6 appx  2 weeks ago. Clinically no evidence of progression noted.; Improved/CEA- trending down [100]; will check today.   # proceed with FOLFIRI + Avastin cycle # 7  today.    # Blood pressure Elevation- sec to Avastin- improved;  cont benicar/hctx;  # PN- G-1 monitor for now. .   #  follow-up with me in 2 weeks/ chemo/UA/labs.

## 2016-05-10 NOTE — Progress Notes (Signed)
[facility] NOTE  [name], [name], [name], [name], [name]; [name]; [name]; [name]; [name]; [name]; [name]; [name]; July 14th  2017 CT- PR  # AUG 28th- FOLFIRI +Avastin; OCT 5th CT scan- PR.   # right kidney lesion 2.6 x 1.6 cm ? 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  Denies any nausea vomiting. Diarrhea also improved. Positive for weight gain. Blood pressures have been steady at home. Denies any significant headaches or epistaxis.  Patient's fatigue is improved. No cramping in the legs no tingling and numbness. Denies any skin rash. No fevers or chills. No sores in the mouth. No chest pain or shortness of the cough.  ROS: A complete 10 point review of system is done which is negative except mentioned above in history of present illness  MEDICAL HISTORY:  Past Medical History:  Diagnosis Date  . Angiolipoma of kidney    followed with serial CTs ,,  Dr. Jacqlyn Larsen  . Arthritis   . Cancer ([place])    colon  .  Chemotherapy induced diarrhea   . Hammer toe   . Hypertension   . Neuro-endocrine carcinoma ([place])   . Obesity (BMI 30-39.9)     SURGICAL HISTORY: Past Surgical History:  Procedure Laterality Date  . ABDOMINAL HYSTERECTOMY  1995   endometriosis, heavy bleeding  . BUNIONECTOMY Bilateral   . CESAREAN SECTION  1985  . COLONOSCOPY WITH PROPOFOL N/A 10/09/2015   Procedure: COLONOSCOPY WITH PROPOFOL;  Surgeon: Robert Bellow, MD;  Location: [facility] ENDOSCOPY;  Service: Endoscopy;  Laterality: N/A;  . OOPHORECTOMY    . PORTACATH PLACEMENT Left 10/01/2015   Procedure: INSERTION PORT-A-CATH;  Surgeon: Robert Bellow, MD;  Location: [facility] ORS;  Service: General;  Laterality: Left;  . ROTATOR CUFF REPAIR Right 2008   right shoulder.  [place]     SOCIAL HISTORY: She lives at home with her family in [place]. 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 Social History   Social History  . Marital status: Married    Spouse name: N/A  . Number of children: N/A  . Years of education: N/A   Occupational History  . Not on file.   Social History Main Topics  . Smoking status: Never Smoker  . Smokeless tobacco: Never Used  . Alcohol use 0.0 oz/week     Comment: occasionally  . Drug use: No  . Sexual activity: Not Currently   Other Topics Concern  . Not on file  Social History Narrative  . No narrative on file    FAMILY HISTORY: Family History  Problem Relation Age of Onset  . Mental illness Mother 81    bipolar, dementia  . Cancer Maternal Aunt 70    breast ca  . Hypertension Father   . Stroke Father 56    deceased  . COPD Father   . Heart disease Sister     deceased    ALLERGIES:  is allergic to bee venom and morphine and related.  MEDICATIONS:  Current Outpatient Prescriptions  Medication Sig Dispense Refill  . ALPRAZolam (XANAX) 0.25 MG tablet Take 1 tablet (0.25 mg total) by mouth 2 (two) times daily as needed for anxiety or  sleep. 60 tablet 3  . amLODipine (NORVASC) 5 MG tablet TAKE ONE TABLET BY MOUTH DAILY 30 tablet 3  . diphenoxylate-atropine (LOMOTIL) 2.5-0.025 MG tablet Take 1 tablet by mouth 4 (four) times daily as needed for diarrhea or loose stools. 30 tablet 6  . lidocaine-prilocaine (EMLA) cream Apply 1 application topically as needed. Apply to port a cath site 1 hour prior to chemotherapy treatments 30 g 6  . Multiple Vitamins-Minerals (MULTIVITAMIN ADULT PO) Take by mouth.    . olmesartan (BENICAR) 40 MG tablet Take 1 tablet (40 mg total) by mouth daily. 30 tablet 2  . ondansetron (ZOFRAN) 8 MG tablet Take 1 tablet (8 mg total) by mouth every 6 (six) hours as needed for nausea or vomiting. 30 tablet 3  . potassium chloride (K-DUR) 10 MEQ tablet Take 1 tablet (10 mEq total) by mouth 2 (two) times daily. 60 tablet 3  . prochlorperazine (COMPAZINE) 10 MG tablet Take 1 tablet (10 mg total) by mouth every 6 (six) hours as needed for nausea or vomiting. 30 tablet 6   No current facility-administered medications for this visit.    Facility-Administered Medications Ordered in Other Visits  Medication Dose Route Frequency Provider Last Rate Last Dose  . heparin lock flush 100 unit/mL  500 Units Intravenous Once [name], MD      . sodium chloride flush (NS) 0.9 % injection 10 mL  10 mL Intravenous PRN [name], MD   10 mL at 11/24/15 0836      .  PHYSICAL EXAMINATION: ECOG PERFORMANCE STATUS: 0 - Asymptomatic  There were no vitals filed for this visit. There were no vitals filed for this visit.  GENERAL: Well-nourished well-developed; Alert, no distress and comfortable. She is with daughter.  EYES: no pallor or icterus OROPHARYNX: no thrush or ulceration; good dentition  NECK: supple, no masses felt LYMPH:  no palpable lymphadenopathy in the cervical, axillary or inguinal regions LUNGS: clear to auscultation and  No wheeze or crackles HEART/CVS: regular rate & rhythm and no  murmurs; No lower extremity edema ABDOMEN: abdomen soft, non-tender and normal bowel sounds; positive for hepatomegaly. Musculoskeletal:no cyanosis of digits and no clubbing  PSYCH: alert & oriented x 3 with fluent speech NEURO: no focal motor/sensory deficits SKIN:  no rashes or significant lesions; Mediport is in place. Positive for alopecia.  LABORATORY DATA:  I have reviewed the data as listed Lab Results  Component Value Date   WBC 3.8 04/26/2016   HGB 10.9 (L) 04/26/2016   HCT 30.6 (L) 04/26/2016   MCV 99.4 04/26/2016   PLT 248 04/26/2016    Recent Labs  03/29/16 0906 04/05/16 1130 04/12/16 0911 04/26/16 0916  NA 137 135 135 137  K 4.1 4.2 3.8 4.0  CL 106 104  105 107  CO2 26 25 24 25   GLUCOSE 87 94 75 89  BUN 15 17 17 17   CREATININE 0.98 0.99 0.87 0.92  CALCIUM 9.3 9.0 8.9 9.0  GFRNONAA 59* 59* >60 >60  GFRAA >60 >60 >60 >60  PROT 8.1  --  8.0 7.5  ALBUMIN 3.7  --  3.9 3.6  AST 26  --  26 25  ALT 24  --  21 18  ALKPHOS 79  --  88 87  BILITOT 0.6  --  0.6 0.3    RADIOGRAPHIC STUDIES: I have personally reviewed the radiological images as listed and agreed with the findings in the report. No results found.  ASSESSMENT & PLAN:   .No problem-specific Assessment & Plan notes found for this encounter.    [name], MD 05/10/2016 9:35 AM

## 2016-05-10 NOTE — Progress Notes (Signed)
Patient is here for follow up, she is doing well no complaints.  

## 2016-05-11 ENCOUNTER — Encounter: Payer: Self-pay | Admitting: Interventional Radiology

## 2016-05-12 ENCOUNTER — Inpatient Hospital Stay: Payer: Medicare HMO

## 2016-05-12 VITALS — BP 106/72 | HR 92 | Temp 97.3°F | Resp 18

## 2016-05-12 DIAGNOSIS — C801 Malignant (primary) neoplasm, unspecified: Secondary | ICD-10-CM

## 2016-05-12 DIAGNOSIS — Z5112 Encounter for antineoplastic immunotherapy: Secondary | ICD-10-CM | POA: Diagnosis not present

## 2016-05-12 MED ORDER — SODIUM CHLORIDE 0.9 % IJ SOLN
10.0000 mL | Freq: Once | INTRAMUSCULAR | Status: AC
  Administered 2016-05-12: 10 mL via INTRAVENOUS
  Filled 2016-05-12: qty 10

## 2016-05-12 MED ORDER — HEPARIN SOD (PORK) LOCK FLUSH 100 UNIT/ML IV SOLN
500.0000 [IU] | Freq: Once | INTRAVENOUS | Status: AC
  Administered 2016-05-12: 500 [IU] via INTRAVENOUS
  Filled 2016-05-12: qty 5

## 2016-05-24 ENCOUNTER — Inpatient Hospital Stay (HOSPITAL_BASED_OUTPATIENT_CLINIC_OR_DEPARTMENT_OTHER): Payer: Medicare HMO | Admitting: Internal Medicine

## 2016-05-24 ENCOUNTER — Inpatient Hospital Stay: Payer: Medicare HMO | Attending: Internal Medicine

## 2016-05-24 ENCOUNTER — Inpatient Hospital Stay: Payer: Medicare HMO

## 2016-05-24 VITALS — BP 139/91 | HR 63 | Temp 96.1°F | Wt 194.0 lb

## 2016-05-24 DIAGNOSIS — C7A1 Malignant poorly differentiated neuroendocrine tumors: Secondary | ICD-10-CM

## 2016-05-24 DIAGNOSIS — E669 Obesity, unspecified: Secondary | ICD-10-CM | POA: Diagnosis not present

## 2016-05-24 DIAGNOSIS — Z5111 Encounter for antineoplastic chemotherapy: Secondary | ICD-10-CM

## 2016-05-24 DIAGNOSIS — C7A8 Other malignant neuroendocrine tumors: Secondary | ICD-10-CM | POA: Diagnosis not present

## 2016-05-24 DIAGNOSIS — D1771 Benign lipomatous neoplasm of kidney: Secondary | ICD-10-CM | POA: Insufficient documentation

## 2016-05-24 DIAGNOSIS — C787 Secondary malignant neoplasm of liver and intrahepatic bile duct: Secondary | ICD-10-CM | POA: Diagnosis not present

## 2016-05-24 DIAGNOSIS — C182 Malignant neoplasm of ascending colon: Secondary | ICD-10-CM

## 2016-05-24 DIAGNOSIS — Z79899 Other long term (current) drug therapy: Secondary | ICD-10-CM | POA: Insufficient documentation

## 2016-05-24 DIAGNOSIS — I10 Essential (primary) hypertension: Secondary | ICD-10-CM | POA: Insufficient documentation

## 2016-05-24 DIAGNOSIS — J069 Acute upper respiratory infection, unspecified: Secondary | ICD-10-CM | POA: Insufficient documentation

## 2016-05-24 DIAGNOSIS — C799 Secondary malignant neoplasm of unspecified site: Secondary | ICD-10-CM

## 2016-05-24 DIAGNOSIS — Z803 Family history of malignant neoplasm of breast: Secondary | ICD-10-CM

## 2016-05-24 DIAGNOSIS — C189 Malignant neoplasm of colon, unspecified: Secondary | ICD-10-CM

## 2016-05-24 LAB — URINALYSIS COMPLETE WITH MICROSCOPIC (ARMC ONLY)
BILIRUBIN URINE: NEGATIVE
GLUCOSE, UA: NEGATIVE mg/dL
KETONES UR: NEGATIVE mg/dL
LEUKOCYTES UA: NEGATIVE
NITRITE: NEGATIVE
PH: 5 (ref 5.0–8.0)
Protein, ur: NEGATIVE mg/dL
SPECIFIC GRAVITY, URINE: 1.015 (ref 1.005–1.030)

## 2016-05-24 LAB — COMPREHENSIVE METABOLIC PANEL
ALBUMIN: 3.4 g/dL — AB (ref 3.5–5.0)
ALT: 18 U/L (ref 14–54)
ANION GAP: 7 (ref 5–15)
AST: 24 U/L (ref 15–41)
Alkaline Phosphatase: 92 U/L (ref 38–126)
BILIRUBIN TOTAL: 0.3 mg/dL (ref 0.3–1.2)
BUN: 21 mg/dL — ABNORMAL HIGH (ref 6–20)
CO2: 24 mmol/L (ref 22–32)
Calcium: 8.8 mg/dL — ABNORMAL LOW (ref 8.9–10.3)
Chloride: 106 mmol/L (ref 101–111)
Creatinine, Ser: 1.09 mg/dL — ABNORMAL HIGH (ref 0.44–1.00)
GFR calc non Af Amer: 52 mL/min — ABNORMAL LOW (ref >60)
GLUCOSE: 93 mg/dL (ref 65–99)
POTASSIUM: 3.6 mmol/L (ref 3.5–5.1)
SODIUM: 137 mmol/L (ref 135–145)
TOTAL PROTEIN: 7.4 g/dL (ref 6.5–8.1)

## 2016-05-24 LAB — MAGNESIUM: Magnesium: 1.8 mg/dL (ref 1.7–2.4)

## 2016-05-24 LAB — CBC WITH DIFFERENTIAL/PLATELET
BASOS PCT: 1 %
Basophils Absolute: 0 10*3/uL (ref 0–0.1)
EOS ABS: 0.2 10*3/uL (ref 0–0.7)
Eosinophils Relative: 5 %
HEMATOCRIT: 31 % — AB (ref 35.0–47.0)
Hemoglobin: 10.8 g/dL — ABNORMAL LOW (ref 12.0–16.0)
Lymphocytes Relative: 39 %
Lymphs Abs: 1.4 10*3/uL (ref 1.0–3.6)
MCH: 34.8 pg — AB (ref 26.0–34.0)
MCHC: 34.8 g/dL (ref 32.0–36.0)
MCV: 99.9 fL (ref 80.0–100.0)
MONO ABS: 0.3 10*3/uL (ref 0.2–0.9)
MONOS PCT: 8 %
Neutro Abs: 1.7 10*3/uL (ref 1.4–6.5)
Neutrophils Relative %: 47 %
Platelets: 295 10*3/uL (ref 150–440)
RBC: 3.1 MIL/uL — ABNORMAL LOW (ref 3.80–5.20)
RDW: 17.3 % — AB (ref 11.5–14.5)
WBC: 3.6 10*3/uL (ref 3.6–11.0)

## 2016-05-24 MED ORDER — SODIUM CHLORIDE 0.9 % IV SOLN: 10.0000 mg | Freq: Once | INTRAVENOUS | Status: DC

## 2016-05-24 MED ORDER — SODIUM CHLORIDE 0.9% FLUSH
10.0000 mL | INTRAVENOUS | Status: DC | PRN
  Administered 2016-05-24: 10 mL via INTRAVENOUS
  Filled 2016-05-24: qty 10

## 2016-05-24 MED ORDER — ATROPINE SULFATE 1 MG/ML IJ SOLN
0.5000 mg | Freq: Once | INTRAMUSCULAR | Status: AC | PRN
  Administered 2016-05-24: 0.5 mg via INTRAVENOUS
  Filled 2016-05-24: qty 1

## 2016-05-24 MED ORDER — PALONOSETRON HCL INJECTION 0.25 MG/5ML
0.2500 mg | Freq: Once | INTRAVENOUS | Status: AC
  Administered 2016-05-24: 0.25 mg via INTRAVENOUS
  Filled 2016-05-24: qty 5

## 2016-05-24 MED ORDER — BEVACIZUMAB CHEMO INJECTION 400 MG/16ML
400.0000 mg | Freq: Once | INTRAVENOUS | Status: AC
  Administered 2016-05-24: 400 mg via INTRAVENOUS
  Filled 2016-05-24: qty 16

## 2016-05-24 MED ORDER — HEPARIN SOD (PORK) LOCK FLUSH 100 UNIT/ML IV SOLN: 500.0000 [IU] | Freq: Once | INTRAVENOUS | Status: DC

## 2016-05-24 MED ORDER — SODIUM CHLORIDE 0.9 % IV SOLN
2400.0000 mg/m2 | INTRAVENOUS | Status: DC
  Administered 2016-05-24: 4750 mg via INTRAVENOUS
  Filled 2016-05-24: qty 95

## 2016-05-24 MED ORDER — IRINOTECAN HCL CHEMO INJECTION 100 MG/5ML
140.0000 mg/m2 | Freq: Once | INTRAVENOUS | Status: AC
  Administered 2016-05-24: 280 mg via INTRAVENOUS
  Filled 2016-05-24: qty 14

## 2016-05-24 MED ORDER — SODIUM CHLORIDE 0.9 % IV SOLN
Freq: Once | INTRAVENOUS | Status: AC
  Administered 2016-05-24: 10:00:00 via INTRAVENOUS
  Filled 2016-05-24: qty 1000

## 2016-05-24 MED ORDER — LEUCOVORIN CALCIUM INJECTION 350 MG
800.0000 mg | Freq: Once | INTRAVENOUS | Status: AC
  Administered 2016-05-24: 800 mg via INTRAVENOUS
  Filled 2016-05-24: qty 40

## 2016-05-24 MED ORDER — LORAZEPAM 2 MG/ML IJ SOLN
1.0000 mg | Freq: Once | INTRAMUSCULAR | Status: AC
  Administered 2016-05-24: 1 mg via INTRAVENOUS
  Filled 2016-05-24: qty 1

## 2016-05-24 MED ORDER — SODIUM CHLORIDE 0.9 % IV SOLN
Freq: Once | INTRAVENOUS | Status: AC
  Administered 2016-05-24: 10:00:00 via INTRAVENOUS
  Filled 2016-05-24: qty 5

## 2016-05-24 NOTE — Assessment & Plan Note (Addendum)
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 Clinically no evidence of progression noted.; Improved/CEA- trending down [100]; will add CEA today.   # proceed with FOLFIRI + Avastin cycle # 8  today.    # Blood pressure Elevation- sec to Avastin- improved;  cont benicar/hctx;  # PN- G-1 monitor for now.   #  follow-up with me in 2 weeks/ chemo/UA/labs.

## 2016-05-24 NOTE — Progress Notes (Signed)
[facility] NOTE  [name], [name], [name], [name], [name]; [name]; [name]; [name]; [name]; [name]; [name]; [name]; July 14th  2017 CT- PR  # AUG 28th- FOLFIRI +Avastin; OCT 5th CT scan- PR.   # right kidney lesion 2.6 x 1.6 cm ? 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  Denies any nausea vomiting. Diarrhea also improved. Denies any significant headaches or epistaxis. Denies any skin rash. No fevers or chills. No sores in the mouth. No chest pain or shortness of the cough.  Patient's fatigue is improved. No cramping in the legs no tingling and numbness. No swelling in the legs  ROS: A complete 10 point review of system is done which is negative except mentioned above in history of present illness  MEDICAL HISTORY:  Past Medical History:  Diagnosis Date  . Angiolipoma of kidney    followed with serial CTs ,,  Dr. Jacqlyn Larsen  . Arthritis   . Cancer ([place])    colon  . Chemotherapy induced diarrhea   . Hammer toe    . Hypertension   . Neuro-endocrine carcinoma ([place])   . Obesity (BMI 30-39.9)     SURGICAL HISTORY: Past Surgical History:  Procedure Laterality Date  . ABDOMINAL HYSTERECTOMY  1995   endometriosis, heavy bleeding  . BUNIONECTOMY Bilateral   . CESAREAN SECTION  1985  . COLONOSCOPY WITH PROPOFOL N/A 10/09/2015   Procedure: COLONOSCOPY WITH PROPOFOL;  Surgeon: Robert Bellow, MD;  Location[facility] ENDOSCOPY;  Service: Endoscopy;  Laterality: N/A;  . IR GENERIC HISTORICAL  04/07/2016   IR RADIOLOGIST EVAL & MGMT 04/07/2016 Arne Cleveland, MD [facility] INTERV RAD  . OOPHORECTOMY    . PORTACATH PLACEMENT Left 10/01/2015   Procedure: INSERTION PORT-A-CATH;  Surgeon: Robert Bellow, MD;  Location: [facility] ORS;  Service: General;  Laterality: Left;  . ROTATOR CUFF REPAIR Right 2008   right shoulder.  [place]     SOCIAL HISTORY: She lives at home with her family in [place]. 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 Social History   Social History  . Marital status: Married    Spouse name: N/A  . Number of children: N/A  . Years of education: N/A   Occupational History  . Not on file.   Social History Main Topics  . Smoking status: Never Smoker  . Smokeless tobacco: Never Used  . Alcohol use 0.0 oz/week     Comment: occasionally  . Drug use: No  .  Sexual activity: Not Currently   Other Topics Concern  . Not on file   Social History Narrative  . No narrative on file    FAMILY HISTORY: Family History  Problem Relation Age of Onset  . Mental illness Mother 37    bipolar, dementia  . Cancer Maternal Aunt 70    breast ca  . Hypertension Father   . Stroke Father 72    deceased  . COPD Father   . Heart disease Sister     deceased    ALLERGIES:  is allergic to bee venom and morphine and related.  MEDICATIONS:  Current Outpatient Prescriptions  Medication Sig Dispense Refill  . ALPRAZolam (XANAX) 0.25 MG tablet Take 1 tablet  (0.25 mg total) by mouth 2 (two) times daily as needed for anxiety or sleep. 60 tablet 3  . amLODipine (NORVASC) 5 MG tablet TAKE ONE TABLET BY MOUTH DAILY 30 tablet 3  . diphenoxylate-atropine (LOMOTIL) 2.5-0.025 MG tablet Take 1 tablet by mouth 4 (four) times daily as needed for diarrhea or loose stools. 30 tablet 6  . lidocaine-prilocaine (EMLA) cream Apply 1 application topically as needed. Apply to port a cath site 1 hour prior to chemotherapy treatments 30 g 6  . Multiple Vitamins-Minerals (MULTIVITAMIN ADULT PO) Take by mouth.    . olmesartan (BENICAR) 40 MG tablet Take 1 tablet (40 mg total) by mouth daily. 30 tablet 2  . ondansetron (ZOFRAN) 8 MG tablet Take 1 tablet (8 mg total) by mouth every 6 (six) hours as needed for nausea or vomiting. 30 tablet 3  . potassium chloride (K-DUR) 10 MEQ tablet Take 1 tablet (10 mEq total) by mouth 2 (two) times daily. 60 tablet 3  . prochlorperazine (COMPAZINE) 10 MG tablet Take 1 tablet (10 mg total) by mouth every 6 (six) hours as needed for nausea or vomiting. 30 tablet 6   No current facility-administered medications for this visit.    Facility-Administered Medications Ordered in Other Visits  Medication Dose Route Frequency Provider Last Rate Last Dose  . heparin lock flush 100 unit/mL  500 Units Intravenous Once [name], MD      . sodium chloride flush (NS) 0.9 % injection 10 mL  10 mL Intravenous PRN [name], MD   10 mL at 11/24/15 0836      .  PHYSICAL EXAMINATION: ECOG PERFORMANCE STATUS: 0 - Asymptomatic  Vitals:   05/24/16 0858  BP: (!) 139/91  Pulse: 63  Temp: (!) 96.1 F (35.6 C)   Filed Weights   05/24/16 0858  Weight: 194 lb (88 kg)    GENERAL: Well-nourished well-developed; Alert, no distress and comfortable. She is with daughter.  EYES: no pallor or icterus OROPHARYNX: no thrush or ulceration; good dentition  NECK: supple, no masses felt LYMPH:  no palpable lymphadenopathy in the cervical,  axillary or inguinal regions LUNGS: clear to auscultation and  No wheeze or crackles HEART/CVS: regular rate & rhythm and no murmurs; No lower extremity edema ABDOMEN: abdomen soft, non-tender and normal bowel sounds; positive for hepatomegaly. Musculoskeletal:no cyanosis of digits and no clubbing  PSYCH: alert & oriented x 3 with fluent speech NEURO: no focal motor/sensory deficits SKIN:  no rashes or significant lesions; Mediport is in place. Positive for alopecia.  LABORATORY DATA:  I have reviewed the data as listed Lab Results  Component Value Date   WBC 3.6 05/24/2016   HGB 10.8 (L) 05/24/2016   HCT 31.0 (L) 05/24/2016   MCV 99.9 05/24/2016  PLT 295 05/24/2016    Recent Labs  04/26/16 0916 05/10/16 0944 05/24/16 0835  NA 137 136 137  K 4.0 4.0 3.6  CL 107 105 106  CO2 25 25 24   GLUCOSE 89 105* 93  BUN 17 16 21*  CREATININE 0.92 0.96 1.09*  CALCIUM 9.0 8.8* 8.8*  GFRNONAA >60 >60 52*  GFRAA >60 >60 >60  PROT 7.5 7.7 7.4  ALBUMIN 3.6 3.7 3.4*  AST 25 29 24   ALT 18 27 18   ALKPHOS 87 81 92  BILITOT 0.3 0.5 0.3    RADIOGRAPHIC STUDIES: I have personally reviewed the radiological images as listed and agreed with the findings in the report. No results found.  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 Clinically no evidence of progression noted.; Improved/CEA- trending down [100]; will add CEA today.   # proceed with FOLFIRI + Avastin cycle # 8  today.    # Blood pressure Elevation- sec to Avastin- improved;  cont benicar/hctx;  # PN- G-1 monitor for now.   #  follow-up with me in 2 weeks/ chemo/UA/labs.    [name], MD 05/25/2016 7:55 AM

## 2016-05-24 NOTE — Progress Notes (Signed)
Patient here today for follow up Malignant neoplasm of ascending colon.  Patient has no new concerns today.

## 2016-05-25 LAB — CEA: CEA: 91.5 ng/mL — ABNORMAL HIGH (ref 0.0–4.7)

## 2016-05-26 ENCOUNTER — Inpatient Hospital Stay: Payer: Medicare HMO

## 2016-05-26 VITALS — BP 141/87 | HR 81 | Temp 96.0°F | Resp 18

## 2016-05-26 DIAGNOSIS — C799 Secondary malignant neoplasm of unspecified site: Secondary | ICD-10-CM

## 2016-05-26 DIAGNOSIS — Z5111 Encounter for antineoplastic chemotherapy: Secondary | ICD-10-CM | POA: Diagnosis not present

## 2016-05-26 DIAGNOSIS — C189 Malignant neoplasm of colon, unspecified: Secondary | ICD-10-CM

## 2016-05-26 DIAGNOSIS — C7A8 Other malignant neuroendocrine tumors: Secondary | ICD-10-CM

## 2016-05-26 DIAGNOSIS — C182 Malignant neoplasm of ascending colon: Secondary | ICD-10-CM

## 2016-05-26 MED ORDER — SODIUM CHLORIDE 0.9% FLUSH
10.0000 mL | INTRAVENOUS | Status: DC | PRN
  Administered 2016-05-26: 10 mL
  Filled 2016-05-26: qty 10

## 2016-05-26 MED ORDER — HEPARIN SOD (PORK) LOCK FLUSH 100 UNIT/ML IV SOLN
500.0000 [IU] | Freq: Once | INTRAVENOUS | Status: AC | PRN
  Administered 2016-05-26: 500 [IU]
  Filled 2016-05-26: qty 5

## 2016-05-27 DIAGNOSIS — C799 Secondary malignant neoplasm of unspecified site: Secondary | ICD-10-CM | POA: Diagnosis not present

## 2016-05-27 DIAGNOSIS — C7A8 Other malignant neuroendocrine tumors: Secondary | ICD-10-CM | POA: Diagnosis not present

## 2016-05-31 ENCOUNTER — Other Ambulatory Visit: Payer: Self-pay | Admitting: *Deleted

## 2016-05-31 MED ORDER — POTASSIUM CHLORIDE ER 10 MEQ PO TBCR: 10.0000 meq | EXTENDED_RELEASE_TABLET | Freq: Two times a day (BID) | ORAL | 3 refills | Status: DC

## 2016-06-07 ENCOUNTER — Inpatient Hospital Stay: Payer: Medicare HMO

## 2016-06-07 ENCOUNTER — Inpatient Hospital Stay (HOSPITAL_BASED_OUTPATIENT_CLINIC_OR_DEPARTMENT_OTHER): Payer: Medicare HMO | Admitting: Internal Medicine

## 2016-06-07 VITALS — BP 145/99 | HR 91 | Temp 97.6°F | Resp 20 | Ht 64.0 in | Wt 198.0 lb

## 2016-06-07 VITALS — BP 135/90 | HR 96

## 2016-06-07 DIAGNOSIS — J069 Acute upper respiratory infection, unspecified: Secondary | ICD-10-CM

## 2016-06-07 DIAGNOSIS — Z803 Family history of malignant neoplasm of breast: Secondary | ICD-10-CM

## 2016-06-07 DIAGNOSIS — C787 Secondary malignant neoplasm of liver and intrahepatic bile duct: Secondary | ICD-10-CM | POA: Diagnosis not present

## 2016-06-07 DIAGNOSIS — Z5111 Encounter for antineoplastic chemotherapy: Secondary | ICD-10-CM

## 2016-06-07 DIAGNOSIS — C182 Malignant neoplasm of ascending colon: Secondary | ICD-10-CM

## 2016-06-07 DIAGNOSIS — C189 Malignant neoplasm of colon, unspecified: Secondary | ICD-10-CM

## 2016-06-07 DIAGNOSIS — I10 Essential (primary) hypertension: Secondary | ICD-10-CM | POA: Diagnosis not present

## 2016-06-07 DIAGNOSIS — C7A8 Other malignant neuroendocrine tumors: Secondary | ICD-10-CM

## 2016-06-07 DIAGNOSIS — C799 Secondary malignant neoplasm of unspecified site: Principal | ICD-10-CM

## 2016-06-07 DIAGNOSIS — C7A1 Malignant poorly differentiated neuroendocrine tumors: Secondary | ICD-10-CM

## 2016-06-07 DIAGNOSIS — E669 Obesity, unspecified: Secondary | ICD-10-CM

## 2016-06-07 DIAGNOSIS — D1771 Benign lipomatous neoplasm of kidney: Secondary | ICD-10-CM

## 2016-06-07 DIAGNOSIS — Z79899 Other long term (current) drug therapy: Secondary | ICD-10-CM

## 2016-06-07 LAB — URINALYSIS, COMPLETE (UACMP) WITH MICROSCOPIC
BACTERIA UA: NONE SEEN
BILIRUBIN URINE: NEGATIVE
GLUCOSE, UA: NEGATIVE mg/dL
KETONES UR: NEGATIVE mg/dL
NITRITE: NEGATIVE
PROTEIN: NEGATIVE mg/dL
Specific Gravity, Urine: 1.014 (ref 1.005–1.030)
pH: 5 (ref 5.0–8.0)

## 2016-06-07 LAB — CBC WITH DIFFERENTIAL/PLATELET
BASOS ABS: 0 10*3/uL (ref 0–0.1)
Basophils Relative: 1 %
EOS ABS: 0.1 10*3/uL (ref 0–0.7)
EOS PCT: 3 %
HCT: 32.1 % — ABNORMAL LOW (ref 35.0–47.0)
Hemoglobin: 11.1 g/dL — ABNORMAL LOW (ref 12.0–16.0)
Lymphocytes Relative: 31 %
Lymphs Abs: 1.1 10*3/uL (ref 1.0–3.6)
MCH: 34.3 pg — ABNORMAL HIGH (ref 26.0–34.0)
MCHC: 34.5 g/dL (ref 32.0–36.0)
MCV: 99.4 fL (ref 80.0–100.0)
Monocytes Absolute: 0.3 10*3/uL (ref 0.2–0.9)
Monocytes Relative: 10 %
Neutro Abs: 1.9 10*3/uL (ref 1.4–6.5)
Neutrophils Relative %: 55 %
PLATELETS: 261 10*3/uL (ref 150–440)
RBC: 3.23 MIL/uL — AB (ref 3.80–5.20)
RDW: 17 % — ABNORMAL HIGH (ref 11.5–14.5)
WBC: 3.4 10*3/uL — AB (ref 3.6–11.0)

## 2016-06-07 LAB — COMPREHENSIVE METABOLIC PANEL
ALT: 22 U/L (ref 14–54)
AST: 29 U/L (ref 15–41)
Albumin: 3.8 g/dL (ref 3.5–5.0)
Alkaline Phosphatase: 98 U/L (ref 38–126)
Anion gap: 6 (ref 5–15)
BUN: 13 mg/dL (ref 6–20)
CHLORIDE: 107 mmol/L (ref 101–111)
CO2: 23 mmol/L (ref 22–32)
CREATININE: 0.97 mg/dL (ref 0.44–1.00)
Calcium: 8.7 mg/dL — ABNORMAL LOW (ref 8.9–10.3)
GFR calc non Af Amer: 60 mL/min — ABNORMAL LOW (ref >60)
Glucose, Bld: 95 mg/dL (ref 65–99)
POTASSIUM: 3.6 mmol/L (ref 3.5–5.1)
SODIUM: 136 mmol/L (ref 135–145)
Total Bilirubin: 0.5 mg/dL (ref 0.3–1.2)
Total Protein: 7.7 g/dL (ref 6.5–8.1)

## 2016-06-07 LAB — MAGNESIUM: MAGNESIUM: 1.9 mg/dL (ref 1.7–2.4)

## 2016-06-07 MED ORDER — LEUCOVORIN CALCIUM INJECTION 350 MG
800.0000 mg | Freq: Once | INTRAVENOUS | Status: AC
  Administered 2016-06-07: 800 mg via INTRAVENOUS
  Filled 2016-06-07: qty 40

## 2016-06-07 MED ORDER — BEVACIZUMAB CHEMO INJECTION 400 MG/16ML
5.0000 mg/kg | Freq: Once | INTRAVENOUS | Status: AC
  Administered 2016-06-07: 450 mg via INTRAVENOUS
  Filled 2016-06-07: qty 16

## 2016-06-07 MED ORDER — SODIUM CHLORIDE 0.9 % IV SOLN: 5.0000 mg/kg | Freq: Once | INTRAVENOUS | Status: DC

## 2016-06-07 MED ORDER — ATROPINE SULFATE 1 MG/ML IJ SOLN
0.5000 mg | Freq: Once | INTRAMUSCULAR | Status: AC | PRN
  Administered 2016-06-07: 0.5 mg via INTRAVENOUS
  Filled 2016-06-07: qty 1

## 2016-06-07 MED ORDER — SODIUM CHLORIDE 0.9 % IV SOLN: 10.0000 mg | Freq: Once | INTRAVENOUS | Status: DC

## 2016-06-07 MED ORDER — PALONOSETRON HCL INJECTION 0.25 MG/5ML
0.2500 mg | Freq: Once | INTRAVENOUS | Status: AC
  Administered 2016-06-07: 0.25 mg via INTRAVENOUS
  Filled 2016-06-07: qty 5

## 2016-06-07 MED ORDER — SODIUM CHLORIDE 0.9 % IV SOLN
Freq: Once | INTRAVENOUS | Status: AC
  Administered 2016-06-07: 10:00:00 via INTRAVENOUS
  Filled 2016-06-07: qty 5

## 2016-06-07 MED ORDER — IRINOTECAN HCL CHEMO INJECTION 100 MG/5ML
140.0000 mg/m2 | Freq: Once | INTRAVENOUS | Status: AC
  Administered 2016-06-07: 280 mg via INTRAVENOUS
  Filled 2016-06-07: qty 14

## 2016-06-07 MED ORDER — SODIUM CHLORIDE 0.9 % IV SOLN
2400.0000 mg/m2 | INTRAVENOUS | Status: DC
  Administered 2016-06-07: 4750 mg via INTRAVENOUS
  Filled 2016-06-07: qty 95

## 2016-06-07 MED ORDER — SODIUM CHLORIDE 0.9 % IV SOLN
Freq: Once | INTRAVENOUS | Status: AC
  Administered 2016-06-07: 10:00:00 via INTRAVENOUS
  Filled 2016-06-07: qty 1000

## 2016-06-07 MED ORDER — SODIUM CHLORIDE 0.9% FLUSH
10.0000 mL | INTRAVENOUS | Status: DC | PRN
  Administered 2016-06-07: 10 mL
  Filled 2016-06-07: qty 10

## 2016-06-07 MED ORDER — LORAZEPAM 2 MG/ML IJ SOLN
1.0000 mg | Freq: Once | INTRAMUSCULAR | Status: AC
  Administered 2016-06-07: 1 mg via INTRAVENOUS
  Filled 2016-06-07: qty 1

## 2016-06-07 MED ORDER — DEXAMETHASONE SODIUM PHOSPHATE 10 MG/ML IJ SOLN: 10.0000 mg | Freq: Once | INTRAMUSCULAR | Status: DC

## 2016-06-07 NOTE — Assessment & Plan Note (Addendum)
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 Clinically no evidence of progression noted.; Improved/CEA- trending down [90]  # proceed with FOLFIRI + Avastin cycle # 9  today. Starting this cycle will space out chemotherapy to every 3 weeks.   # Blood pressure Elevation- sec to Avastin- improved;  cont benicar/hctx;  # URI on suda fed; recommend holding sudafed; recommend mucinex.   # PN- G-1 monitor for now.   #  follow-up with me in 3 weeks/ chemo/UA/labs.; will order CT scan at next visit.

## 2016-06-07 NOTE — Progress Notes (Signed)
[facility] NOTE  [name], [name], [name], [name], [name]; [name]; [name]; [name]; [name]; [name]; [name]; [name]; July 14th  2017 CT- PR  # AUG 28th- FOLFIRI +Avastin; OCT 5th CT scan- PR.   # right kidney lesion 2.6 x 1.6 cm ? 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  Appetite is good. Denies any diarrhea. Complains of mild nasal congestion otherwise no cough. Patient had been taking Sudafed. Denies any significant headaches or epistaxis. Denies any skin rash. No fevers or chills. No sores in the mouth. No chest pain or shortness of the cough.  Patient's fatigue is improved. No cramping in the legs no tingling and numbness. No swelling in the legs  ROS: A complete 10 point review of system is done which is negative except mentioned above in history of present illness  MEDICAL HISTORY:  Past Medical History:  Diagnosis Date  . Angiolipoma of kidney    followed with serial CTs ,,  Dr. Jacqlyn Larsen  . Arthritis   .  Cancer ([place])    colon  . Chemotherapy induced diarrhea   . Hammer toe   . Hypertension   . Neuro-endocrine carcinoma ([place])   . Obesity (BMI 30-39.9)     SURGICAL HISTORY: Past Surgical History:  Procedure Laterality Date  . ABDOMINAL HYSTERECTOMY  1995   endometriosis, heavy bleeding  . BUNIONECTOMY Bilateral   . CESAREAN SECTION  1985  . COLONOSCOPY WITH PROPOFOL N/A 10/09/2015   Procedure: COLONOSCOPY WITH PROPOFOL;  Surgeon: Robert Bellow, MD;  Location[facility] ENDOSCOPY;  Service: Endoscopy;  Laterality: N/A;  . IR GENERIC HISTORICAL  04/07/2016   IR RADIOLOGIST EVAL & MGMT 04/07/2016 Arne Cleveland, MD [facility] INTERV RAD  . OOPHORECTOMY    . PORTACATH PLACEMENT Left 10/01/2015   Procedure: INSERTION PORT-A-CATH;  Surgeon: Robert Bellow, MD;  Location: [facility] ORS;  Service: General;  Laterality: Left;  . ROTATOR CUFF REPAIR Right 2008   right shoulder.  [place]     SOCIAL HISTORY: She lives at home with her family in [place]. 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 Social History   Social History  . Marital status: Married    Spouse name: N/A  . Number of children: N/A  . Years of education: N/A   Occupational History  . Not on file.   Social History Main Topics  . Smoking status: Never Smoker  . Smokeless tobacco: Never Used  . Alcohol use 0.0 oz/week  Comment: occasionally  . Drug use: No  . Sexual activity: Not Currently   Other Topics Concern  . Not on file   Social History Narrative  . No narrative on file    FAMILY HISTORY: Family History  Problem Relation Age of Onset  . Mental illness Mother 19    bipolar, dementia  . Cancer Maternal Aunt 70    breast ca  . Hypertension Father   . Stroke Father 64    deceased  . COPD Father   . Heart disease Sister     deceased    ALLERGIES:  is allergic to bee venom and morphine and related.  MEDICATIONS:  Current Outpatient Prescriptions  Medication  Sig Dispense Refill  . ALPRAZolam (XANAX) 0.25 MG tablet Take 1 tablet (0.25 mg total) by mouth 2 (two) times daily as needed for anxiety or sleep. 60 tablet 3  . amLODipine (NORVASC) 5 MG tablet TAKE ONE TABLET BY MOUTH DAILY 30 tablet 3  . lidocaine-prilocaine (EMLA) cream Apply 1 application topically as needed. Apply to port a cath site 1 hour prior to chemotherapy treatments 30 g 6  . Multiple Vitamins-Minerals (MULTIVITAMIN ADULT PO) Take 1 tablet by mouth daily.     [place] Kitchen olmesartan (BENICAR) 40 MG tablet Take 1 tablet (40 mg total) by mouth daily. 30 tablet 2  . potassium chloride (K-DUR) 10 MEQ tablet Take 1 tablet (10 mEq total) by mouth 2 (two) times daily. 60 tablet 3  . diphenoxylate-atropine (LOMOTIL) 2.5-0.025 MG tablet Take 1 tablet by mouth 4 (four) times daily as needed for diarrhea or loose stools. (Patient not taking: Reported on 06/07/2016) 30 tablet 6  . ondansetron (ZOFRAN) 8 MG tablet Take 1 tablet (8 mg total) by mouth every 6 (six) hours as needed for nausea or vomiting. (Patient not taking: Reported on 06/07/2016) 30 tablet 3  . prochlorperazine (COMPAZINE) 10 MG tablet Take 1 tablet (10 mg total) by mouth every 6 (six) hours as needed for nausea or vomiting. (Patient not taking: Reported on 06/07/2016) 30 tablet 6   No current facility-administered medications for this visit.    Facility-Administered Medications Ordered in Other Visits  Medication Dose Route Frequency Provider Last Rate Last Dose  . heparin lock flush 100 unit/mL  500 Units Intravenous Once [name], MD      . sodium chloride flush (NS) 0.9 % injection 10 mL  10 mL Intravenous PRN [name], MD   10 mL at 11/24/15 0836      .  PHYSICAL EXAMINATION: ECOG PERFORMANCE STATUS: 0 - Asymptomatic  Vitals:   06/07/16 0902 06/07/16 0906  BP: (!) 159/106 (!) 145/99  Pulse: 91   Resp: 20   Temp: 97.6 F (36.4 C)    Filed Weights   06/07/16 0902  Weight: 198 lb (89.8 kg)     GENERAL: Well-nourished well-developed; Alert, no distress and comfortable. She is with daughter.  EYES: no pallor or icterus OROPHARYNX: no thrush or ulceration; good dentition  NECK: supple, no masses felt LYMPH:  no palpable lymphadenopathy in the cervical, axillary or inguinal regions LUNGS: clear to auscultation and  No wheeze or crackles HEART/CVS: regular rate & rhythm and no murmurs; No lower extremity edema ABDOMEN: abdomen soft, non-tender and normal bowel sounds; positive for hepatomegaly. Musculoskeletal:no cyanosis of digits and no clubbing  PSYCH: alert & oriented x 3 with fluent speech NEURO: no focal motor/sensory deficits SKIN:  no rashes or significant lesions; Mediport is in place. Positive for  alopecia.  LABORATORY DATA:  I have reviewed the data as listed Lab Results  Component Value Date   WBC 3.4 (L) 06/07/2016   HGB 11.1 (L) 06/07/2016   HCT 32.1 (L) 06/07/2016   MCV 99.4 06/07/2016   PLT 261 06/07/2016    Recent Labs  05/10/16 0944 05/24/16 0835 06/07/16 0835  NA 136 137 136  K 4.0 3.6 3.6  CL 105 106 107  CO2 25 24 23   GLUCOSE 105* 93 95  BUN 16 21* 13  CREATININE 0.96 1.09* 0.97  CALCIUM 8.8* 8.8* 8.7*  GFRNONAA >60 52* 60*  GFRAA >60 >60 >60  PROT 7.7 7.4 7.7  ALBUMIN 3.7 3.4* 3.8  AST 29 24 29   ALT 27 18 22   ALKPHOS 81 92 98  BILITOT 0.5 0.3 0.5    RADIOGRAPHIC STUDIES: I have personally reviewed the radiological images as listed and agreed with the findings in the report. No results found.  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 Clinically no evidence of progression noted.; Improved/CEA- trending down [90]  # proceed with FOLFIRI + Avastin cycle # 9  today. Starting this cycle will space out chemotherapy to every 3 weeks.   # Blood pressure Elevation- sec  to Avastin- improved;  cont benicar/hctx;  # URI on suda fed; recommend holding sudafed; recommend mucinex.   # PN- G-1 monitor for now.   #  follow-up with me in 3 weeks/ chemo/UA/labs.; will order CT scan at next visit.     [name], MD 06/08/2016 7:41 AM

## 2016-06-09 ENCOUNTER — Inpatient Hospital Stay: Payer: Medicare HMO

## 2016-06-09 VITALS — BP 144/93 | HR 81 | Temp 97.0°F | Resp 20

## 2016-06-09 DIAGNOSIS — C799 Secondary malignant neoplasm of unspecified site: Secondary | ICD-10-CM

## 2016-06-09 DIAGNOSIS — Z5111 Encounter for antineoplastic chemotherapy: Secondary | ICD-10-CM | POA: Diagnosis not present

## 2016-06-09 DIAGNOSIS — C189 Malignant neoplasm of colon, unspecified: Secondary | ICD-10-CM

## 2016-06-09 DIAGNOSIS — C7A8 Other malignant neuroendocrine tumors: Secondary | ICD-10-CM

## 2016-06-09 DIAGNOSIS — C182 Malignant neoplasm of ascending colon: Secondary | ICD-10-CM

## 2016-06-09 MED ORDER — HEPARIN SOD (PORK) LOCK FLUSH 100 UNIT/ML IV SOLN
500.0000 [IU] | Freq: Once | INTRAVENOUS | Status: AC | PRN
  Administered 2016-06-09: 500 [IU]

## 2016-06-09 MED ORDER — HEPARIN SOD (PORK) LOCK FLUSH 100 UNIT/ML IV SOLN
INTRAVENOUS | Status: AC
  Filled 2016-06-09: qty 5

## 2016-06-09 MED ORDER — SODIUM CHLORIDE 0.9% FLUSH
10.0000 mL | INTRAVENOUS | Status: DC | PRN
  Administered 2016-06-09: 10 mL
  Filled 2016-06-09: qty 10

## 2016-06-27 DIAGNOSIS — C799 Secondary malignant neoplasm of unspecified site: Secondary | ICD-10-CM | POA: Diagnosis not present

## 2016-06-27 DIAGNOSIS — C7A8 Other malignant neuroendocrine tumors: Secondary | ICD-10-CM | POA: Diagnosis not present

## 2016-06-28 ENCOUNTER — Inpatient Hospital Stay: Payer: Medicare HMO

## 2016-06-28 ENCOUNTER — Inpatient Hospital Stay: Payer: Medicare HMO | Attending: Internal Medicine | Admitting: Internal Medicine

## 2016-06-28 VITALS — BP 130/88 | HR 83 | Temp 96.3°F | Wt 196.1 lb

## 2016-06-28 DIAGNOSIS — Z79899 Other long term (current) drug therapy: Secondary | ICD-10-CM | POA: Insufficient documentation

## 2016-06-28 DIAGNOSIS — D171 Benign lipomatous neoplasm of skin and subcutaneous tissue of trunk: Secondary | ICD-10-CM

## 2016-06-28 DIAGNOSIS — C182 Malignant neoplasm of ascending colon: Secondary | ICD-10-CM

## 2016-06-28 DIAGNOSIS — C787 Secondary malignant neoplasm of liver and intrahepatic bile duct: Secondary | ICD-10-CM | POA: Diagnosis not present

## 2016-06-28 DIAGNOSIS — C7A8 Other malignant neuroendocrine tumors: Secondary | ICD-10-CM | POA: Diagnosis not present

## 2016-06-28 DIAGNOSIS — E669 Obesity, unspecified: Secondary | ICD-10-CM | POA: Insufficient documentation

## 2016-06-28 DIAGNOSIS — R040 Epistaxis: Secondary | ICD-10-CM | POA: Diagnosis not present

## 2016-06-28 DIAGNOSIS — Z5111 Encounter for antineoplastic chemotherapy: Secondary | ICD-10-CM

## 2016-06-28 DIAGNOSIS — D1771 Benign lipomatous neoplasm of kidney: Secondary | ICD-10-CM | POA: Diagnosis not present

## 2016-06-28 DIAGNOSIS — I10 Essential (primary) hypertension: Secondary | ICD-10-CM | POA: Insufficient documentation

## 2016-06-28 DIAGNOSIS — C189 Malignant neoplasm of colon, unspecified: Secondary | ICD-10-CM

## 2016-06-28 DIAGNOSIS — J069 Acute upper respiratory infection, unspecified: Secondary | ICD-10-CM | POA: Diagnosis not present

## 2016-06-28 DIAGNOSIS — C799 Secondary malignant neoplasm of unspecified site: Principal | ICD-10-CM

## 2016-06-28 DIAGNOSIS — Z803 Family history of malignant neoplasm of breast: Secondary | ICD-10-CM | POA: Insufficient documentation

## 2016-06-28 DIAGNOSIS — R599 Enlarged lymph nodes, unspecified: Secondary | ICD-10-CM | POA: Insufficient documentation

## 2016-06-28 DIAGNOSIS — R197 Diarrhea, unspecified: Secondary | ICD-10-CM | POA: Insufficient documentation

## 2016-06-28 DIAGNOSIS — I700 Atherosclerosis of aorta: Secondary | ICD-10-CM | POA: Diagnosis not present

## 2016-06-28 DIAGNOSIS — K5730 Diverticulosis of large intestine without perforation or abscess without bleeding: Secondary | ICD-10-CM | POA: Diagnosis not present

## 2016-06-28 DIAGNOSIS — M129 Arthropathy, unspecified: Secondary | ICD-10-CM | POA: Diagnosis not present

## 2016-06-28 LAB — URINALYSIS, COMPLETE (UACMP) WITH MICROSCOPIC
Bilirubin Urine: NEGATIVE
GLUCOSE, UA: NEGATIVE mg/dL
HGB URINE DIPSTICK: NEGATIVE
KETONES UR: NEGATIVE mg/dL
Leukocytes, UA: NEGATIVE
NITRITE: NEGATIVE
PH: 6 (ref 5.0–8.0)
PROTEIN: NEGATIVE mg/dL
Specific Gravity, Urine: 1.003 — ABNORMAL LOW (ref 1.005–1.030)

## 2016-06-28 LAB — CBC WITH DIFFERENTIAL/PLATELET
BASOS ABS: 0 10*3/uL (ref 0–0.1)
Basophils Relative: 1 %
EOS PCT: 4 %
Eosinophils Absolute: 0.1 10*3/uL (ref 0–0.7)
HEMATOCRIT: 33.4 % — AB (ref 35.0–47.0)
Hemoglobin: 11.6 g/dL — ABNORMAL LOW (ref 12.0–16.0)
LYMPHS PCT: 43 %
Lymphs Abs: 1.3 10*3/uL (ref 1.0–3.6)
MCH: 34.7 pg — AB (ref 26.0–34.0)
MCHC: 34.8 g/dL (ref 32.0–36.0)
MCV: 99.8 fL (ref 80.0–100.0)
MONO ABS: 0.3 10*3/uL (ref 0.2–0.9)
MONOS PCT: 10 %
NEUTROS ABS: 1.3 10*3/uL — AB (ref 1.4–6.5)
Neutrophils Relative %: 42 %
PLATELETS: 307 10*3/uL (ref 150–440)
RBC: 3.34 MIL/uL — ABNORMAL LOW (ref 3.80–5.20)
RDW: 17.7 % — AB (ref 11.5–14.5)
WBC: 3.2 10*3/uL — ABNORMAL LOW (ref 3.6–11.0)

## 2016-06-28 LAB — COMPREHENSIVE METABOLIC PANEL
ALT: 20 U/L (ref 14–54)
ANION GAP: 5 (ref 5–15)
AST: 30 U/L (ref 15–41)
Albumin: 3.6 g/dL (ref 3.5–5.0)
Alkaline Phosphatase: 93 U/L (ref 38–126)
BILIRUBIN TOTAL: 0.7 mg/dL (ref 0.3–1.2)
BUN: 14 mg/dL (ref 6–20)
CHLORIDE: 109 mmol/L (ref 101–111)
CO2: 23 mmol/L (ref 22–32)
Calcium: 8.8 mg/dL — ABNORMAL LOW (ref 8.9–10.3)
Creatinine, Ser: 0.9 mg/dL (ref 0.44–1.00)
GFR calc Af Amer: >60 mL/min (ref >60)
Glucose, Bld: 76 mg/dL (ref 65–99)
POTASSIUM: 3.6 mmol/L (ref 3.5–5.1)
Sodium: 137 mmol/L (ref 135–145)
Total Protein: 7.6 g/dL (ref 6.5–8.1)

## 2016-06-28 MED ORDER — SODIUM CHLORIDE 0.9 % IV SOLN
Freq: Once | INTRAVENOUS | Status: AC
  Administered 2016-06-28: 10:00:00 via INTRAVENOUS
  Filled 2016-06-28: qty 1000

## 2016-06-28 MED ORDER — PALONOSETRON HCL INJECTION 0.25 MG/5ML
0.2500 mg | Freq: Once | INTRAVENOUS | Status: AC
  Administered 2016-06-28: 0.25 mg via INTRAVENOUS
  Filled 2016-06-28: qty 5

## 2016-06-28 MED ORDER — DEXAMETHASONE SODIUM PHOSPHATE 10 MG/ML IJ SOLN: 10.0000 mg | Freq: Once | INTRAMUSCULAR | Status: DC

## 2016-06-28 MED ORDER — SODIUM CHLORIDE 0.9 % IV SOLN
450.0000 mg | Freq: Once | INTRAVENOUS | Status: AC
  Administered 2016-06-28: 450 mg via INTRAVENOUS
  Filled 2016-06-28: qty 16

## 2016-06-28 MED ORDER — HEPARIN SOD (PORK) LOCK FLUSH 100 UNIT/ML IV SOLN: 500.0000 [IU] | Freq: Once | INTRAVENOUS | Status: DC | PRN

## 2016-06-28 MED ORDER — LORAZEPAM 2 MG/ML IJ SOLN
1.0000 mg | Freq: Once | INTRAMUSCULAR | Status: AC
  Administered 2016-06-28: 1 mg via INTRAVENOUS
  Filled 2016-06-28: qty 1

## 2016-06-28 MED ORDER — LEUCOVORIN CALCIUM INJECTION 350 MG
800.0000 mg | Freq: Once | INTRAMUSCULAR | Status: AC
  Administered 2016-06-28: 800 mg via INTRAVENOUS
  Filled 2016-06-28: qty 40

## 2016-06-28 MED ORDER — SODIUM CHLORIDE 0.9 % IV SOLN
2400.0000 mg/m2 | INTRAVENOUS | Status: DC
  Administered 2016-06-28: 4750 mg via INTRAVENOUS
  Filled 2016-06-28: qty 95

## 2016-06-28 MED ORDER — SODIUM CHLORIDE 0.9% FLUSH
10.0000 mL | Freq: Once | INTRAVENOUS | Status: AC
  Administered 2016-06-28: 10 mL via INTRAVENOUS
  Filled 2016-06-28: qty 10

## 2016-06-28 MED ORDER — IRINOTECAN HCL CHEMO INJECTION 100 MG/5ML
140.0000 mg/m2 | Freq: Once | INTRAVENOUS | Status: AC
  Administered 2016-06-28: 280 mg via INTRAVENOUS
  Filled 2016-06-28: qty 10

## 2016-06-28 MED ORDER — SODIUM CHLORIDE 0.9 % IV SOLN
Freq: Once | INTRAVENOUS | Status: AC
  Administered 2016-06-28: 10:00:00 via INTRAVENOUS
  Filled 2016-06-28: qty 5

## 2016-06-28 MED ORDER — ATROPINE SULFATE 1 MG/ML IJ SOLN
0.5000 mg | Freq: Once | INTRAMUSCULAR | Status: AC | PRN
  Administered 2016-06-28: 0.5 mg via INTRAVENOUS
  Filled 2016-06-28: qty 1

## 2016-06-28 NOTE — Progress Notes (Signed)
ANC: 1300. MD, Dr. Rogue Bussing, notified via telephone and already aware. Per MD order: proceed with treatment today.

## 2016-06-28 NOTE — Progress Notes (Signed)
Patient here today for follow up.  Patient has no new concerns today  

## 2016-06-28 NOTE — Progress Notes (Signed)
[facility] NOTE  [name], [name], [name], [name], [name]; [name]; [name]; [name]; [name]; [name]; [name]; [name]; July 14th  2017 CT- PR  # AUG 28th- FOLFIRI +Avastin; OCT 5th CT scan- PR.   # right kidney lesion 2.6 x 1.6 cm ? 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   Interim patient was treated with Levaquin for possible bronchitis. Noted to have mild nosebleed currently resolved.  Appetite is good. Denies any diarrhea. Denies any significant headaches or epistaxis. Denies any skin rash. No fevers or chills. No sores in the mouth. No chest pain or shortness of the cough.  Patient's fatigue is improved. No cramping in the legs no tingling and numbness. No swelling in the legs  ROS: A complete 10 point review of system is done which is negative except mentioned above in history of present illness  MEDICAL HISTORY:  Past Medical History:  Diagnosis Date  . Angiolipoma of kidney    followed with serial CTs ,,   Dr. Jacqlyn Larsen  . Arthritis   . Cancer ([place])    colon  . Chemotherapy induced diarrhea   . Hammer toe   . Hypertension   . Neuro-endocrine carcinoma ([place])   . Obesity (BMI 30-39.9)     SURGICAL HISTORY: Past Surgical History:  Procedure Laterality Date  . ABDOMINAL HYSTERECTOMY  1995   endometriosis, heavy bleeding  . BUNIONECTOMY Bilateral   . CESAREAN SECTION  1985  . COLONOSCOPY WITH PROPOFOL N/A 10/09/2015   Procedure: COLONOSCOPY WITH PROPOFOL;  Surgeon: Robert Bellow, MD;  Location: [facility] ENDOSCOPY;  Service: Endoscopy;  Laterality: N/A;  . IR GENERIC HISTORICAL  04/07/2016   IR RADIOLOGIST EVAL & MGMT 04/07/2016 Arne Cleveland, MD [facility] INTERV RAD  . OOPHORECTOMY    . PORTACATH PLACEMENT Left 10/01/2015   Procedure: INSERTION PORT-A-CATH;  Surgeon: Robert Bellow, MD;  Location: [facility] ORS;  Service: General;  Laterality: Left;  . ROTATOR CUFF REPAIR Right 2008   right shoulder.  [place]     SOCIAL HISTORY: She lives at home with her family in [place]. 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 Social History   Social History  . Marital status: Married    Spouse name: N/A  . Number of children: N/A  . Years of education: N/A   Occupational History  . Not on file.   Social History Main Topics  . Smoking status: Never Smoker  . Smokeless tobacco: Never Used  .  Alcohol use 0.0 oz/week     Comment: occasionally  . Drug use: No  . Sexual activity: Not Currently   Other Topics Concern  . Not on file   Social History Narrative  . No narrative on file    FAMILY HISTORY: Family History  Problem Relation Age of Onset  . Mental illness Mother 39    bipolar, dementia  . Cancer Maternal Aunt 70    breast ca  . Hypertension Father   . Stroke Father 51    deceased  . COPD Father   . Heart disease Sister     deceased    ALLERGIES:  is allergic to bee venom and morphine and related.  MEDICATIONS:  Current Outpatient  Prescriptions  Medication Sig Dispense Refill  . ALPRAZolam (XANAX) 0.25 MG tablet Take 1 tablet (0.25 mg total) by mouth 2 (two) times daily as needed for anxiety or sleep. 60 tablet 3  . amLODipine (NORVASC) 5 MG tablet TAKE ONE TABLET BY MOUTH DAILY 30 tablet 3  . diphenoxylate-atropine (LOMOTIL) 2.5-0.025 MG tablet Take 1 tablet by mouth 4 (four) times daily as needed for diarrhea or loose stools. 30 tablet 6  . lidocaine-prilocaine (EMLA) cream Apply 1 application topically as needed. Apply to port a cath site 1 hour prior to chemotherapy treatments 30 g 6  . Multiple Vitamins-Minerals (MULTIVITAMIN ADULT PO) Take 1 tablet by mouth daily.     Marland Kitchen olmesartan (BENICAR) 40 MG tablet Take 1 tablet (40 mg total) by mouth daily. 30 tablet 2  . ondansetron (ZOFRAN) 8 MG tablet Take 1 tablet (8 mg total) by mouth every 6 (six) hours as needed for nausea or vomiting. 30 tablet 3  . potassium chloride (K-DUR) 10 MEQ tablet Take 1 tablet (10 mEq total) by mouth 2 (two) times daily. 60 tablet 3  . prochlorperazine (COMPAZINE) 10 MG tablet Take 1 tablet (10 mg total) by mouth every 6 (six) hours as needed for nausea or vomiting. 30 tablet 6   No current facility-administered medications for this visit.    Facility-Administered Medications Ordered in Other Visits  Medication Dose Route Frequency Provider Last Rate Last Dose  . heparin lock flush 100 unit/mL  500 Units Intravenous Once [name], MD      . sodium chloride flush (NS) 0.9 % injection 10 mL  10 mL Intravenous PRN [name], MD   10 mL at 11/24/15 0836      .  PHYSICAL EXAMINATION: ECOG PERFORMANCE STATUS: 0 - Asymptomatic  Vitals:   06/28/16 0859  BP: 130/88  Pulse: 83  Temp: (!) 96.3 F (35.7 C)   Filed Weights   06/28/16 0859  Weight: 196 lb 2 oz (89 kg)    GENERAL: Well-nourished well-developed; Alert, no distress and comfortable. She is with daughter.  EYES: no pallor or icterus OROPHARYNX: no  thrush or ulceration; good dentition  NECK: supple, no masses felt LYMPH:  no palpable lymphadenopathy in the cervical, axillary or inguinal regions LUNGS: clear to auscultation and  No wheeze or crackles HEART/CVS: regular rate & rhythm and no murmurs; No lower extremity edema ABDOMEN: abdomen soft, non-tender and normal bowel sounds; positive for hepatomegaly. Musculoskeletal:no cyanosis of digits and no clubbing  PSYCH: alert & oriented x 3 with fluent speech NEURO: no focal motor/sensory deficits SKIN:  no rashes or significant lesions; Mediport is in place. Positive for alopecia.  LABORATORY DATA:  I have reviewed the data as listed Lab Results  Component Value Date  WBC 3.2 (L) 06/28/2016   HGB 11.6 (L) 06/28/2016   HCT 33.4 (L) 06/28/2016   MCV 99.8 06/28/2016   PLT 307 06/28/2016    Recent Labs  05/24/16 0835 06/07/16 0835 06/28/16 0832  NA 137 136 137  K 3.6 3.6 3.6  CL 106 107 109  CO2 24 23 23   GLUCOSE 93 95 76  BUN 21* 13 14  CREATININE 1.09* 0.97 0.90  CALCIUM 8.8* 8.7* 8.8*  GFRNONAA 52* 60* >60  GFRAA >60 >60 >60  PROT 7.4 7.7 7.6  ALBUMIN 3.4* 3.8 3.6  AST 24 29 30   ALT 18 22 20   ALKPHOS 92 98 93  BILITOT 0.3 0.5 0.7    RADIOGRAPHIC STUDIES: I have personally reviewed the radiological images as listed and agreed with the findings in the report. No results found.  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 Clinically no evidence of progression noted.; Improved/CEA- trending down [90]; await CEA from today.   # proceed with FOLFIRI + Avastin cycle # 10  Today. Labs reviewed acceptable ANC 1.3.  # Blood pressure Elevation- sec to Avastin- improved;  cont benicar/hctx;  # Epistaxis- grade 1 recommend nasal saline spray/  # URI s/p Levaquin. Improved.    # PN- G-1 monitor for  now.   #  follow-up with me in 3 weeks/ chemo/UA/labs.; CT scan prior.    [name], MD 06/29/2016 7:45 AM

## 2016-06-28 NOTE — Assessment & Plan Note (Addendum)
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 Clinically no evidence of progression noted.; Improved/CEA- trending down [90]; await CEA from today.   # proceed with FOLFIRI + Avastin cycle # 10  Today. Labs reviewed acceptable ANC 1.3.  # Blood pressure Elevation- sec to Avastin- improved;  cont benicar/hctx;  # Epistaxis- grade 1 recommend nasal saline spray/  # URI s/p Levaquin. Improved.    # PN- G-1 monitor for now.   #  follow-up with me in 3 weeks/ chemo/UA/labs.; CT scan prior.

## 2016-06-29 LAB — CEA: CEA: 94.5 ng/mL — ABNORMAL HIGH (ref 0.0–4.7)

## 2016-06-30 ENCOUNTER — Inpatient Hospital Stay: Payer: Medicare HMO

## 2016-06-30 VITALS — BP 153/88 | HR 84 | Temp 96.0°F | Resp 18

## 2016-06-30 DIAGNOSIS — C7A8 Other malignant neuroendocrine tumors: Secondary | ICD-10-CM

## 2016-06-30 DIAGNOSIS — C189 Malignant neoplasm of colon, unspecified: Secondary | ICD-10-CM

## 2016-06-30 DIAGNOSIS — C799 Secondary malignant neoplasm of unspecified site: Secondary | ICD-10-CM

## 2016-06-30 DIAGNOSIS — Z5111 Encounter for antineoplastic chemotherapy: Secondary | ICD-10-CM

## 2016-06-30 DIAGNOSIS — C182 Malignant neoplasm of ascending colon: Secondary | ICD-10-CM

## 2016-06-30 MED ORDER — SODIUM CHLORIDE 0.9% FLUSH
10.0000 mL | INTRAVENOUS | Status: DC | PRN
  Administered 2016-06-30: 10 mL
  Filled 2016-06-30: qty 10

## 2016-06-30 MED ORDER — HEPARIN SOD (PORK) LOCK FLUSH 100 UNIT/ML IV SOLN
500.0000 [IU] | Freq: Once | INTRAVENOUS | Status: AC | PRN
  Administered 2016-06-30: 500 [IU]
  Filled 2016-06-30: qty 5

## 2016-07-13 ENCOUNTER — Telehealth: Payer: Self-pay | Admitting: Internal Medicine

## 2016-07-13 MED ORDER — OSELTAMIVIR PHOSPHATE 75 MG PO CAPS: 75.0000 mg | ORAL_CAPSULE | Freq: Every day | ORAL | 0 refills | Status: DC

## 2016-07-13 NOTE — Telephone Encounter (Signed)
No  body aches and afebrile but patient was exposed to flu and would like to know if she an get tamiflu?

## 2016-07-13 NOTE — Telephone Encounter (Signed)
Pt called and states that she was exposed to someone that had the flu on Sunday. She is experiencing some URI symptoms, nose burning, congestion, headache, sneezing. Pt would like to know if Dr. Derrel Nip would call in Tamiflu. Please advise, thank you!  [place], [place]  Call pt @ [phone]

## 2016-07-13 NOTE — Telephone Encounter (Signed)
She should take tamiflu prophylactically,  One tablet daily for ten days, will send rx to [facility]

## 2016-07-13 NOTE — Telephone Encounter (Signed)
Pt called back looking for an update.

## 2016-07-14 NOTE — Telephone Encounter (Signed)
Left patient detailed message as to the Tamiflu. With instructions to return call with any questions.

## 2016-07-15 ENCOUNTER — Ambulatory Visit
Admission: RE | Admit: 2016-07-15 | Discharge: 2016-07-15 | Disposition: A | Discharge status: Home / Self Care | Payer: Medicare HMO | Source: Ambulatory Visit | Attending: Internal Medicine | Admitting: Internal Medicine

## 2016-07-15 ENCOUNTER — Ambulatory Visit: Payer: Medicare HMO

## 2016-07-15 DIAGNOSIS — C182 Malignant neoplasm of ascending colon: Secondary | ICD-10-CM | POA: Insufficient documentation

## 2016-07-15 DIAGNOSIS — K5730 Diverticulosis of large intestine without perforation or abscess without bleeding: Secondary | ICD-10-CM | POA: Insufficient documentation

## 2016-07-15 DIAGNOSIS — C787 Secondary malignant neoplasm of liver and intrahepatic bile duct: Secondary | ICD-10-CM | POA: Diagnosis not present

## 2016-07-15 DIAGNOSIS — R590 Localized enlarged lymph nodes: Secondary | ICD-10-CM | POA: Insufficient documentation

## 2016-07-15 DIAGNOSIS — C189 Malignant neoplasm of colon, unspecified: Secondary | ICD-10-CM | POA: Diagnosis not present

## 2016-07-15 DIAGNOSIS — I700 Atherosclerosis of aorta: Secondary | ICD-10-CM | POA: Diagnosis not present

## 2016-07-15 MED ORDER — IOPAMIDOL (ISOVUE-300) INJECTION 61%
100.0000 mL | Freq: Once | INTRAVENOUS | Status: AC | PRN
  Administered 2016-07-15: 100 mL via INTRAVENOUS

## 2016-07-19 ENCOUNTER — Inpatient Hospital Stay (HOSPITAL_BASED_OUTPATIENT_CLINIC_OR_DEPARTMENT_OTHER): Payer: Medicare HMO | Admitting: Internal Medicine

## 2016-07-19 ENCOUNTER — Inpatient Hospital Stay: Payer: Medicare HMO

## 2016-07-19 VITALS — BP 162/105 | HR 88 | Temp 96.2°F | Wt 199.5 lb

## 2016-07-19 DIAGNOSIS — C787 Secondary malignant neoplasm of liver and intrahepatic bile duct: Secondary | ICD-10-CM | POA: Diagnosis not present

## 2016-07-19 DIAGNOSIS — C799 Secondary malignant neoplasm of unspecified site: Secondary | ICD-10-CM | POA: Diagnosis not present

## 2016-07-19 DIAGNOSIS — Z5111 Encounter for antineoplastic chemotherapy: Secondary | ICD-10-CM

## 2016-07-19 DIAGNOSIS — C182 Malignant neoplasm of ascending colon: Secondary | ICD-10-CM

## 2016-07-19 DIAGNOSIS — C7A8 Other malignant neuroendocrine tumors: Secondary | ICD-10-CM

## 2016-07-19 DIAGNOSIS — E669 Obesity, unspecified: Secondary | ICD-10-CM

## 2016-07-19 DIAGNOSIS — I700 Atherosclerosis of aorta: Secondary | ICD-10-CM | POA: Diagnosis not present

## 2016-07-19 DIAGNOSIS — R197 Diarrhea, unspecified: Secondary | ICD-10-CM

## 2016-07-19 DIAGNOSIS — M129 Arthropathy, unspecified: Secondary | ICD-10-CM

## 2016-07-19 DIAGNOSIS — R599 Enlarged lymph nodes, unspecified: Secondary | ICD-10-CM

## 2016-07-19 DIAGNOSIS — D1771 Benign lipomatous neoplasm of kidney: Secondary | ICD-10-CM

## 2016-07-19 DIAGNOSIS — I10 Essential (primary) hypertension: Secondary | ICD-10-CM

## 2016-07-19 DIAGNOSIS — Z79899 Other long term (current) drug therapy: Secondary | ICD-10-CM

## 2016-07-19 DIAGNOSIS — K5730 Diverticulosis of large intestine without perforation or abscess without bleeding: Secondary | ICD-10-CM

## 2016-07-19 DIAGNOSIS — Z803 Family history of malignant neoplasm of breast: Secondary | ICD-10-CM

## 2016-07-19 DIAGNOSIS — C189 Malignant neoplasm of colon, unspecified: Secondary | ICD-10-CM

## 2016-07-19 LAB — CBC WITH DIFFERENTIAL/PLATELET
BASOS PCT: 1 %
Basophils Absolute: 0 10*3/uL (ref 0–0.1)
Eosinophils Absolute: 0.2 10*3/uL (ref 0–0.7)
Eosinophils Relative: 6 %
HEMATOCRIT: 34.6 % — AB (ref 35.0–47.0)
Hemoglobin: 11.9 g/dL — ABNORMAL LOW (ref 12.0–16.0)
LYMPHS ABS: 1.4 10*3/uL (ref 1.0–3.6)
LYMPHS PCT: 41 %
MCH: 34.9 pg — AB (ref 26.0–34.0)
MCHC: 34.5 g/dL (ref 32.0–36.0)
MCV: 101.4 fL — AB (ref 80.0–100.0)
MONO ABS: 0.4 10*3/uL (ref 0.2–0.9)
Monocytes Relative: 11 %
NEUTROS ABS: 1.4 10*3/uL (ref 1.4–6.5)
Neutrophils Relative %: 41 %
Platelets: 272 10*3/uL (ref 150–440)
RBC: 3.41 MIL/uL — ABNORMAL LOW (ref 3.80–5.20)
RDW: 16.1 % — AB (ref 11.5–14.5)
WBC: 3.5 10*3/uL — ABNORMAL LOW (ref 3.6–11.0)

## 2016-07-19 LAB — COMPREHENSIVE METABOLIC PANEL
ALT: 22 U/L (ref 14–54)
ANION GAP: 7 (ref 5–15)
AST: 30 U/L (ref 15–41)
Albumin: 3.8 g/dL (ref 3.5–5.0)
Alkaline Phosphatase: 82 U/L (ref 38–126)
BILIRUBIN TOTAL: 0.4 mg/dL (ref 0.3–1.2)
BUN: 19 mg/dL (ref 6–20)
CALCIUM: 8.8 mg/dL — AB (ref 8.9–10.3)
CO2: 21 mmol/L — ABNORMAL LOW (ref 22–32)
Chloride: 108 mmol/L (ref 101–111)
Creatinine, Ser: 0.94 mg/dL (ref 0.44–1.00)
Glucose, Bld: 87 mg/dL (ref 65–99)
POTASSIUM: 3.6 mmol/L (ref 3.5–5.1)
Sodium: 136 mmol/L (ref 135–145)
TOTAL PROTEIN: 7.7 g/dL (ref 6.5–8.1)

## 2016-07-19 LAB — MAGNESIUM: MAGNESIUM: 1.8 mg/dL (ref 1.7–2.4)

## 2016-07-19 MED ORDER — SODIUM CHLORIDE 0.9 % IV SOLN
2400.0000 mg/m2 | INTRAVENOUS | Status: DC
  Administered 2016-07-19: 4750 mg via INTRAVENOUS
  Filled 2016-07-19: qty 95

## 2016-07-19 MED ORDER — LORAZEPAM 2 MG/ML IJ SOLN
1.0000 mg | Freq: Once | INTRAMUSCULAR | Status: AC
  Administered 2016-07-19: 1 mg via INTRAVENOUS
  Filled 2016-07-19: qty 1

## 2016-07-19 MED ORDER — SODIUM CHLORIDE 0.9 % IV SOLN
Freq: Once | INTRAVENOUS | Status: AC
  Administered 2016-07-19: 10:00:00 via INTRAVENOUS
  Filled 2016-07-19: qty 1000

## 2016-07-19 MED ORDER — PALONOSETRON HCL INJECTION 0.25 MG/5ML
0.2500 mg | Freq: Once | INTRAVENOUS | Status: AC
  Administered 2016-07-19: 0.25 mg via INTRAVENOUS
  Filled 2016-07-19: qty 5

## 2016-07-19 MED ORDER — DEXAMETHASONE SODIUM PHOSPHATE 10 MG/ML IJ SOLN
10.0000 mg | Freq: Once | INTRAMUSCULAR | Status: AC
  Administered 2016-07-19: 10 mg via INTRAVENOUS
  Filled 2016-07-19: qty 1

## 2016-07-19 MED ORDER — SODIUM CHLORIDE 0.9% FLUSH
10.0000 mL | INTRAVENOUS | Status: DC | PRN
  Administered 2016-07-19: 10 mL
  Filled 2016-07-19: qty 10

## 2016-07-19 MED ORDER — DEXTROSE 5 % IV SOLN
140.0000 mg/m2 | Freq: Once | INTRAVENOUS | Status: AC
  Administered 2016-07-19: 280 mg via INTRAVENOUS
  Filled 2016-07-19: qty 14

## 2016-07-19 MED ORDER — ATROPINE SULFATE 1 MG/ML IJ SOLN
0.5000 mg | Freq: Once | INTRAMUSCULAR | Status: AC | PRN
  Administered 2016-07-19: 0.5 mg via INTRAVENOUS
  Filled 2016-07-19: qty 1

## 2016-07-19 MED ORDER — SODIUM CHLORIDE 0.9 % IV SOLN
Freq: Once | INTRAVENOUS | Status: AC
  Administered 2016-07-19: 11:00:00 via INTRAVENOUS
  Filled 2016-07-19: qty 5

## 2016-07-19 MED ORDER — LEUCOVORIN CALCIUM INJECTION 350 MG
800.0000 mg | Freq: Once | INTRAMUSCULAR | Status: AC
  Administered 2016-07-19: 800 mg via INTRAVENOUS
  Filled 2016-07-19: qty 40

## 2016-07-19 NOTE — Progress Notes (Signed)
Patient here today for follow up.  Patient states no new concerns today  

## 2016-07-19 NOTE — Assessment & Plan Note (Signed)
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 Clinically no evidence of progression noted.; Improved/CEA- trending down [90]; jan 2018- CEA 94. [stable]  # proceed with FOLFIRI  cycle # 11  Today. Labs reviewed acceptable ANC 1.3.  # Blood pressure Elevation- sec to Avastin/anxiety- HOLD avastin today.  cont benicar/hctx;reheck again at home.   # PN- G-1 monitor for now.   #  follow-up with me in 3 weeks/ chemo/UA/labs.

## 2016-07-19 NOTE — Progress Notes (Signed)
Pt's ANC 1.4 today. Per Dr Rogue Bussing proceed with FOLFIRI. Holding Avastin today due to elevated BP

## 2016-07-19 NOTE — Progress Notes (Signed)
[facility] NOTE  [name], [name], [name], [name], [name]; [name]; [name]; [name]; [name]; [name]; [name]; [name]; July 14th  2017 CT- PR  # AUG 28th- FOLFIRI +Avastin; OCT 5th CT scan- PR.   # right kidney lesion 2.6 x 1.6 cm ? 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   Patient has been anxious to know the results of her CT scan. Denies any abdominal pain and diarrhea. She had episode of abdominal bloating- that improved  Denies any diarrhea. Denies any significant headaches or epistaxis. Denies any skin rash. No fevers or chills. No sores in the mouth. No chest pain or shortness of the cough. Patient's fatigue is improved. No cramping in the legs no tingling and numbness. No swelling in the legs  ROS: A complete 10 point review of system is done which is negative except mentioned above in history of present illness  MEDICAL HISTORY:  Past Medical History:  Diagnosis Date  .  Angiolipoma of kidney    followed with serial CTs ,,  Dr. Jacqlyn Larsen  . Arthritis   . Cancer ([place])    colon  . Chemotherapy induced diarrhea   . Hammer toe   . Hypertension   . Neuro-endocrine carcinoma ([place])   . Obesity (BMI 30-39.9)     SURGICAL HISTORY: Past Surgical History:  Procedure Laterality Date  . ABDOMINAL HYSTERECTOMY  1995   endometriosis, heavy bleeding  . BUNIONECTOMY Bilateral   . CESAREAN SECTION  1985  . COLONOSCOPY WITH PROPOFOL N/A 10/09/2015   Procedure: COLONOSCOPY WITH PROPOFOL;  Surgeon: Robert Bellow, MD;  Location[facility] ENDOSCOPY;  Service: Endoscopy;  Laterality: N/A;  . IR GENERIC HISTORICAL  04/07/2016   IR RADIOLOGIST EVAL & MGMT 04/07/2016 Arne Cleveland, MD [facility] INTERV RAD  . OOPHORECTOMY    . PORTACATH PLACEMENT Left 10/01/2015   Procedure: INSERTION PORT-A-CATH;  Surgeon: Robert Bellow, MD;  Location: [facility] ORS;  Service: General;  Laterality: Left;  . ROTATOR CUFF REPAIR Right 2008   right shoulder.  [place]     SOCIAL HISTORY: She lives at home with her family in [place]. 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 Social History   Social History  . Marital status: Married    Spouse name: N/A  . Number of children: N/A  . Years of education: N/A   Occupational History  . Not on file.   Social History Main Topics  .  Smoking status: Never Smoker  . Smokeless tobacco: Never Used  . Alcohol use 0.0 oz/week     Comment: occasionally  . Drug use: No  . Sexual activity: Not Currently   Other Topics Concern  . Not on file   Social History Narrative  . No narrative on file    FAMILY HISTORY: Family History  Problem Relation Age of Onset  . Mental illness Mother 2    bipolar, dementia  . Cancer Maternal Aunt 70    breast ca  . Hypertension Father   . Stroke Father 85    deceased  . COPD Father   . Heart disease Sister     deceased    ALLERGIES:  is allergic to bee venom and  morphine and related.  MEDICATIONS:  Current Outpatient Prescriptions  Medication Sig Dispense Refill  . ALPRAZolam (XANAX) 0.25 MG tablet Take 1 tablet (0.25 mg total) by mouth 2 (two) times daily as needed for anxiety or sleep. 60 tablet 3  . amLODipine (NORVASC) 5 MG tablet TAKE ONE TABLET BY MOUTH DAILY 30 tablet 3  . diphenoxylate-atropine (LOMOTIL) 2.5-0.025 MG tablet Take 1 tablet by mouth 4 (four) times daily as needed for diarrhea or loose stools. 30 tablet 6  . lidocaine-prilocaine (EMLA) cream Apply 1 application topically as needed. Apply to port a cath site 1 hour prior to chemotherapy treatments 30 g 6  . Multiple Vitamins-Minerals (MULTIVITAMIN ADULT PO) Take 1 tablet by mouth daily.     Marland Kitchen olmesartan (BENICAR) 40 MG tablet Take 1 tablet (40 mg total) by mouth daily. 30 tablet 2  . ondansetron (ZOFRAN) 8 MG tablet Take 1 tablet (8 mg total) by mouth every 6 (six) hours as needed for nausea or vomiting. 30 tablet 3  . potassium chloride (K-DUR) 10 MEQ tablet Take 1 tablet (10 mEq total) by mouth 2 (two) times daily. 60 tablet 3  . prochlorperazine (COMPAZINE) 10 MG tablet Take 1 tablet (10 mg total) by mouth every 6 (six) hours as needed for nausea or vomiting. 30 tablet 6   No current facility-administered medications for this visit.    Facility-Administered Medications Ordered in Other Visits  Medication Dose Route Frequency Provider Last Rate Last Dose  . fluorouracil (ADRUCIL) 4,750 mg in sodium chloride 0.9 % 55 mL chemo infusion  2,400 mg/m2 (Treatment Plan Recorded) Intravenous 1 day or 1 dose [name], MD      . fosaprepitant (EMEND) 150 mg, dexamethasone (DECADRON) 12 mg in sodium chloride 0.9 % 145 mL IVPB   Intravenous Once [name], MD      . heparin lock flush 100 unit/mL  500 Units Intravenous Once [name], MD      . irinotecan (CAMPTOSAR) 280 mg in dextrose 5 % 500 mL chemo infusion  140 mg/m2 (Treatment Plan Recorded) Intravenous  Once [name], MD      . leucovorin 800 mg in dextrose 5 % 250 mL infusion  800 mg Intravenous Once [name], MD      . Margrett Rud palonosetron (ALOXI) injection 0.25 mg  0.25 mg Intravenous Once [name], MD   0.25 mg at 07/19/16 1032  . sodium chloride flush (NS) 0.9 % injection 10 mL  10 mL Intravenous PRN [name], MD   10 mL at 11/24/15 0836  . sodium chloride flush (NS) 0.9 % injection 10 mL  10 mL Intracatheter PRN [name], MD   10 mL at 07/19/16  0915      .  PHYSICAL EXAMINATION: ECOG PERFORMANCE STATUS: 0 - Asymptomatic  Vitals:   07/19/16 0928 07/19/16 0952  BP: (!) 157/105 (!) 162/105  Pulse: 88   Temp: (!) 96.2 F (35.7 C)    Filed Weights   07/19/16 0928  Weight: 199 lb 8 oz (90.5 kg)    GENERAL: Well-nourished well-developed; Alert, no distress and comfortable. She is with daughter.  EYES: no pallor or icterus OROPHARYNX: no thrush or ulceration; good dentition  NECK: supple, no masses felt LYMPH:  no palpable lymphadenopathy in the cervical, axillary or inguinal regions LUNGS: clear to auscultation and  No wheeze or crackles HEART/CVS: regular rate & rhythm and no murmurs; No lower extremity edema ABDOMEN: abdomen soft, non-tender and normal bowel sounds; positive for hepatomegaly. Musculoskeletal:no cyanosis of digits and no clubbing  PSYCH: alert & oriented x 3 with fluent speech NEURO: no focal motor/sensory deficits SKIN:  no rashes or significant lesions; Mediport is in place. Positive for alopecia.  LABORATORY DATA:  I have reviewed the data as listed Lab Results  Component Value Date   WBC 3.5 (L) 07/19/2016   HGB 11.9 (L) 07/19/2016   HCT 34.6 (L) 07/19/2016   MCV 101.4 (H) 07/19/2016   PLT 272 07/19/2016    Recent Labs  06/07/16 0835 06/28/16 0832 07/19/16 0915  NA 136 137 136  K 3.6 3.6 3.6  CL 107 109 108  CO2 23 23 21*  GLUCOSE 95 76 87  BUN 13 14 19   CREATININE 0.97  0.90 0.94  CALCIUM 8.7* 8.8* 8.8*  GFRNONAA 60* >60 >60  GFRAA >60 >60 >60  PROT 7.7 7.6 7.7  ALBUMIN 3.8 3.6 3.8  AST 29 30 30   ALT 22 20 22   ALKPHOS 98 93 82  BILITOT 0.5 0.7 0.4    RADIOGRAPHIC STUDIES: I have personally reviewed the radiological images as listed and agreed with the findings in the report. Ct Abdomen Pelvis W Contrast  Result Date: 07/15/2016 CLINICAL DATA:  66 year old female with history of metastatic neuroendocrine cell carcinoma of the colon with metastatic disease to the liver diagnosed in April 2017, currently undergoing chemotherapy. Followup study. EXAM: CT ABDOMEN AND PELVIS WITH CONTRAST TECHNIQUE: Multidetector CT imaging of the abdomen and pelvis was performed using the standard protocol following bolus administration of intravenous contrast. CONTRAST:  165mL ISOVUE-300 IOPAMIDOL (ISOVUE-300) INJECTION 61% COMPARISON:  CT the abdomen and pelvis 03/25/2016. FINDINGS: Lower chest: Calcification of the mitral annulus. Small hiatal hernia. Hepatobiliary: All previously noted hypovascular lesions in the liver appeared decreased in size. This is best demonstrated by the largest lesion in the periphery of segments 7 and 8, which currently measures only 4.6 x 3.1 cm (axial image 12 of series 2) as compared with 5.2 x 3.7 cm on prior study 03/25/2016. No definite new suspicious appearing hepatic lesions are noted. Small hypervascular lesion measuring 1.7 cm in segment 6, similar to prior studies, likely a small cavernous hemangioma. Other small low-attenuation lesions likely reflect tiny cysts (although several are too small to definitively characterize), with the largest of these measuring up to 1.3 cm in segment 3. No intra or extrahepatic biliary ductal dilatation. Gallbladder is normal in appearance. Pancreas: No pancreatic mass. No pancreatic ductal dilatation. No pancreatic or peripancreatic fluid or inflammatory changes. Spleen: Unremarkable. Adrenals/Urinary Tract: 2.5  x 1.4 cm heterogeneously enhancing lesion extending off the posterior aspect of the lower pole the right kidney is unchanged compared to the prior examination, and very similar  to numerous more remote prior studies, previously demonstrating internal fatty attenuation, presumably an angiomyolipoma. Left kidney and bilateral adrenal glands are normal in appearance. No hydroureteronephrosis. Urinary bladder is normal in appearance. Stomach/Bowel: The appearance of the stomach is normal. No pathologic dilatation of small bowel or colon. A few scattered colonic diverticulae are noted, without surrounding inflammatory changes to suggest an acute diverticulitis at this time. Normal appendix. Mild subjective thickening and of the cecum appears slightly decreased compared to the prior examination. Vascular/Lymphatic: Aortic atherosclerosis, without evidence of aneurysm or dissection in the abdominal or pelvic vasculature. There are several borderline enlarged and mildly enlarged ileocolic lymph nodes measuring up to 13 mm in short axis. Reproductive: Status post hysterectomy. Ovaries are not confidently identified may be surgically absent or atrophic. Other: No significant volume of ascites.  No pneumoperitoneum. Musculoskeletal: There are no aggressive appearing lytic or blastic lesions noted in the visualized portions of the skeleton. IMPRESSION: 1. Today's study demonstrates a positive response to therapy. Previously noted ileocolic lymphadenopathy is decreased compared to the prior study, and multiple known hepatic metastases also decreased in size compared to the prior examination. No new metastatic disease is identified in the abdomen or pelvis. 2. Colonic diverticulosis, without evidence of acute diverticulitis at this time. 3. Aortic atherosclerosis. 4. Additional incidental findings, as above. Electronically Signed   By: Vinnie Langton M.D.   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 Clinically no evidence of progression noted.; Improved/CEA- trending down [90]; jan 2018- CEA 94. [stable]  # proceed with FOLFIRI  cycle # 11  Today. Labs reviewed acceptable ANC 1.3.  # Blood pressure Elevation- sec to Avastin/anxiety- HOLD avastin today.  cont benicar/hctx;reheck again at home.   # PN- G-1 monitor for now.   #  follow-up with me in 3 weeks/ chemo/UA/labs.    [name], MD 07/19/2016 10:31 AM

## 2016-07-21 ENCOUNTER — Inpatient Hospital Stay: Payer: Medicare HMO

## 2016-07-21 VITALS — BP 137/87 | HR 91 | Temp 97.2°F | Resp 20

## 2016-07-21 DIAGNOSIS — C182 Malignant neoplasm of ascending colon: Secondary | ICD-10-CM

## 2016-07-21 DIAGNOSIS — C7A8 Other malignant neuroendocrine tumors: Secondary | ICD-10-CM

## 2016-07-21 DIAGNOSIS — C799 Secondary malignant neoplasm of unspecified site: Secondary | ICD-10-CM

## 2016-07-21 DIAGNOSIS — Z5111 Encounter for antineoplastic chemotherapy: Secondary | ICD-10-CM

## 2016-07-21 DIAGNOSIS — C189 Malignant neoplasm of colon, unspecified: Secondary | ICD-10-CM

## 2016-07-21 MED ORDER — HEPARIN SOD (PORK) LOCK FLUSH 100 UNIT/ML IV SOLN
500.0000 [IU] | Freq: Once | INTRAVENOUS | Status: AC
  Administered 2016-07-21: 500 [IU] via INTRAVENOUS
  Filled 2016-07-21: qty 5

## 2016-07-21 MED ORDER — SODIUM CHLORIDE 0.9% FLUSH
10.0000 mL | INTRAVENOUS | Status: DC | PRN
  Administered 2016-07-21: 10 mL via INTRAVENOUS
  Filled 2016-07-21: qty 10

## 2016-07-22 ENCOUNTER — Other Ambulatory Visit: Payer: Self-pay | Admitting: Internal Medicine

## 2016-07-28 DIAGNOSIS — C7A8 Other malignant neuroendocrine tumors: Secondary | ICD-10-CM | POA: Diagnosis not present

## 2016-07-28 DIAGNOSIS — C799 Secondary malignant neoplasm of unspecified site: Secondary | ICD-10-CM | POA: Diagnosis not present

## 2016-08-09 ENCOUNTER — Inpatient Hospital Stay: Payer: Medicare HMO | Attending: Internal Medicine

## 2016-08-09 ENCOUNTER — Inpatient Hospital Stay: Payer: Medicare HMO

## 2016-08-09 ENCOUNTER — Inpatient Hospital Stay (HOSPITAL_BASED_OUTPATIENT_CLINIC_OR_DEPARTMENT_OTHER): Payer: Medicare HMO | Admitting: Internal Medicine

## 2016-08-09 VITALS — BP 146/95 | HR 80 | Temp 97.4°F | Wt 198.1 lb

## 2016-08-09 DIAGNOSIS — K449 Diaphragmatic hernia without obstruction or gangrene: Secondary | ICD-10-CM

## 2016-08-09 DIAGNOSIS — C7A8 Other malignant neuroendocrine tumors: Secondary | ICD-10-CM | POA: Diagnosis not present

## 2016-08-09 DIAGNOSIS — E669 Obesity, unspecified: Secondary | ICD-10-CM | POA: Diagnosis not present

## 2016-08-09 DIAGNOSIS — K5730 Diverticulosis of large intestine without perforation or abscess without bleeding: Secondary | ICD-10-CM

## 2016-08-09 DIAGNOSIS — I10 Essential (primary) hypertension: Secondary | ICD-10-CM | POA: Diagnosis not present

## 2016-08-09 DIAGNOSIS — R599 Enlarged lymph nodes, unspecified: Secondary | ICD-10-CM | POA: Insufficient documentation

## 2016-08-09 DIAGNOSIS — I700 Atherosclerosis of aorta: Secondary | ICD-10-CM | POA: Diagnosis not present

## 2016-08-09 DIAGNOSIS — G629 Polyneuropathy, unspecified: Secondary | ICD-10-CM | POA: Insufficient documentation

## 2016-08-09 DIAGNOSIS — Z803 Family history of malignant neoplasm of breast: Secondary | ICD-10-CM

## 2016-08-09 DIAGNOSIS — K219 Gastro-esophageal reflux disease without esophagitis: Secondary | ICD-10-CM | POA: Insufficient documentation

## 2016-08-09 DIAGNOSIS — M129 Arthropathy, unspecified: Secondary | ICD-10-CM | POA: Diagnosis not present

## 2016-08-09 DIAGNOSIS — C182 Malignant neoplasm of ascending colon: Secondary | ICD-10-CM

## 2016-08-09 DIAGNOSIS — Z79899 Other long term (current) drug therapy: Secondary | ICD-10-CM

## 2016-08-09 DIAGNOSIS — D1771 Benign lipomatous neoplasm of kidney: Secondary | ICD-10-CM

## 2016-08-09 DIAGNOSIS — C799 Secondary malignant neoplasm of unspecified site: Secondary | ICD-10-CM

## 2016-08-09 DIAGNOSIS — Z5111 Encounter for antineoplastic chemotherapy: Secondary | ICD-10-CM | POA: Insufficient documentation

## 2016-08-09 DIAGNOSIS — C189 Malignant neoplasm of colon, unspecified: Secondary | ICD-10-CM

## 2016-08-09 DIAGNOSIS — C787 Secondary malignant neoplasm of liver and intrahepatic bile duct: Secondary | ICD-10-CM

## 2016-08-09 DIAGNOSIS — C7B8 Other secondary neuroendocrine tumors: Secondary | ICD-10-CM | POA: Insufficient documentation

## 2016-08-09 LAB — CBC WITH DIFFERENTIAL/PLATELET
Basophils Absolute: 0 10*3/uL (ref 0–0.1)
Basophils Relative: 1 %
Eosinophils Absolute: 0.1 10*3/uL (ref 0–0.7)
Eosinophils Relative: 3 %
HCT: 35.5 % (ref 35.0–47.0)
Hemoglobin: 12.3 g/dL (ref 12.0–16.0)
Lymphocytes Relative: 47 %
Lymphs Abs: 1.7 10*3/uL (ref 1.0–3.6)
MCH: 35 pg — ABNORMAL HIGH (ref 26.0–34.0)
MCHC: 34.8 g/dL (ref 32.0–36.0)
MCV: 100.5 fL — ABNORMAL HIGH (ref 80.0–100.0)
Monocytes Absolute: 0.4 10*3/uL (ref 0.2–0.9)
Monocytes Relative: 11 %
Neutro Abs: 1.4 10*3/uL (ref 1.4–6.5)
Neutrophils Relative %: 38 %
Platelets: 332 10*3/uL (ref 150–440)
RBC: 3.53 MIL/uL — ABNORMAL LOW (ref 3.80–5.20)
RDW: 15 % — ABNORMAL HIGH (ref 11.5–14.5)
WBC: 3.7 10*3/uL (ref 3.6–11.0)

## 2016-08-09 LAB — COMPREHENSIVE METABOLIC PANEL
ALT: 21 U/L (ref 14–54)
AST: 26 U/L (ref 15–41)
Albumin: 3.9 g/dL (ref 3.5–5.0)
Alkaline Phosphatase: 95 U/L (ref 38–126)
Anion gap: 6 (ref 5–15)
BUN: 19 mg/dL (ref 6–20)
CO2: 23 mmol/L (ref 22–32)
Calcium: 9 mg/dL (ref 8.9–10.3)
Chloride: 106 mmol/L (ref 101–111)
Creatinine, Ser: 0.97 mg/dL (ref 0.44–1.00)
GFR calc Af Amer: >60 mL/min (ref >60)
GFR calc non Af Amer: 60 mL/min — ABNORMAL LOW (ref >60)
Glucose, Bld: 92 mg/dL (ref 65–99)
Potassium: 4.1 mmol/L (ref 3.5–5.1)
Sodium: 135 mmol/L (ref 135–145)
Total Bilirubin: 0.5 mg/dL (ref 0.3–1.2)
Total Protein: 8.3 g/dL — ABNORMAL HIGH (ref 6.5–8.1)

## 2016-08-09 LAB — URINALYSIS, COMPLETE (UACMP) WITH MICROSCOPIC
BACTERIA UA: NONE SEEN
BILIRUBIN URINE: NEGATIVE
GLUCOSE, UA: NEGATIVE mg/dL
KETONES UR: NEGATIVE mg/dL
Leukocytes, UA: NEGATIVE
NITRITE: NEGATIVE
PROTEIN: NEGATIVE mg/dL
Specific Gravity, Urine: 1.005 (ref 1.005–1.030)
WBC UA: NONE SEEN WBC/hpf (ref 0–5)
pH: 5 (ref 5.0–8.0)

## 2016-08-09 LAB — MAGNESIUM: Magnesium: 1.8 mg/dL (ref 1.7–2.4)

## 2016-08-09 MED ORDER — ATROPINE SULFATE 1 MG/ML IJ SOLN
0.5000 mg | Freq: Once | INTRAMUSCULAR | Status: AC | PRN
  Administered 2016-08-09: 0.5 mg via INTRAVENOUS
  Filled 2016-08-09: qty 1

## 2016-08-09 MED ORDER — SODIUM CHLORIDE 0.9 % IV SOLN
2400.0000 mg/m2 | INTRAVENOUS | Status: DC
  Administered 2016-08-09: 4750 mg via INTRAVENOUS
  Filled 2016-08-09: qty 95

## 2016-08-09 MED ORDER — HEPARIN SOD (PORK) LOCK FLUSH 100 UNIT/ML IV SOLN: 500.0000 [IU] | Freq: Once | INTRAVENOUS | Status: DC | PRN

## 2016-08-09 MED ORDER — PALONOSETRON HCL INJECTION 0.25 MG/5ML
0.2500 mg | Freq: Once | INTRAVENOUS | Status: AC
  Administered 2016-08-09: 0.25 mg via INTRAVENOUS
  Filled 2016-08-09: qty 5

## 2016-08-09 MED ORDER — IRINOTECAN HCL CHEMO INJECTION 100 MG/5ML
140.0000 mg/m2 | Freq: Once | INTRAVENOUS | Status: AC
  Administered 2016-08-09: 280 mg via INTRAVENOUS
  Filled 2016-08-09: qty 10

## 2016-08-09 MED ORDER — SODIUM CHLORIDE 0.9 % IV SOLN
Freq: Once | INTRAVENOUS | Status: AC
  Administered 2016-08-09: 12:00:00 via INTRAVENOUS
  Filled 2016-08-09: qty 5

## 2016-08-09 MED ORDER — LEUCOVORIN CALCIUM INJECTION 350 MG
800.0000 mg | Freq: Once | INTRAVENOUS | Status: AC
  Administered 2016-08-09: 800 mg via INTRAVENOUS
  Filled 2016-08-09: qty 40

## 2016-08-09 MED ORDER — DEXAMETHASONE SODIUM PHOSPHATE 10 MG/ML IJ SOLN: 10.0000 mg | Freq: Once | INTRAMUSCULAR | Status: DC

## 2016-08-09 MED ORDER — SODIUM CHLORIDE 0.9% FLUSH
10.0000 mL | INTRAVENOUS | Status: DC | PRN
  Administered 2016-08-09: 10 mL via INTRAVENOUS
  Filled 2016-08-09: qty 10

## 2016-08-09 MED ORDER — LORAZEPAM 2 MG/ML IJ SOLN
1.0000 mg | Freq: Once | INTRAMUSCULAR | Status: AC
  Administered 2016-08-09: 1 mg via INTRAVENOUS
  Filled 2016-08-09: qty 1

## 2016-08-09 MED ORDER — HEPARIN SOD (PORK) LOCK FLUSH 100 UNIT/ML IV SOLN: 500.0000 [IU] | Freq: Once | INTRAVENOUS | Status: DC

## 2016-08-09 MED ORDER — SODIUM CHLORIDE 0.9 % IV SOLN
450.0000 mg | Freq: Once | INTRAVENOUS | Status: AC
  Administered 2016-08-09: 450 mg via INTRAVENOUS
  Filled 2016-08-09: qty 16

## 2016-08-09 MED ORDER — SODIUM CHLORIDE 0.9 % IV SOLN
Freq: Once | INTRAVENOUS | Status: AC
  Administered 2016-08-09: 12:00:00 via INTRAVENOUS
  Filled 2016-08-09: qty 1000

## 2016-08-09 NOTE — Progress Notes (Signed)
Patient here today for follow up.  Patient states no new concerns today  

## 2016-08-09 NOTE — Assessment & Plan Note (Addendum)
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 Clinically no evidence of progression noted.; Improved/CEA- trending down [90]; jan 2018- CEA 94. [stable]  # proceed with FOLFIRI + Avastin cycle # 12  Today. Labs reviewed acceptable ANC 1.3.  # Blood pressure Elevation- sec to Avastin/anxiety-improved; res-start avastin today.   cont benicar/hctx;reheck again at home. Better at home.   # GERD/ reflux- on [place] prn. Trial of PPI.   # PN- G-1 monitor for now.   #  follow-up with me in 3 weeks/ chemo/UA/labs.

## 2016-08-09 NOTE — Progress Notes (Signed)
[place] NOTE  [name], [name], [name], [name], [name]; [name]; [name]; [name]; [name]; [name]; [name]; [name]; July 14th  2017 CT- PR  # AUG 28th- FOLFIRI +Avastin; OCT 5th CT scan- PR.   # right kidney lesion 2.6 x 1.6 cm ? 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   Denies any abdominal pain and diarrhea. Patient had a episode of epigastric discomfort which she attributes to her history of reflux. Currently improved. Denies any significant headaches or epistaxis. Denies any skin rash. No fevers or chills. No sores in the mouth. No chest pain or shortness of the cough. Patient's fatigue is improved. No cramping in the legs no tingling and numbness. No swelling in the legs  ROS: A complete 10 point review of system is done which is negative except mentioned above in history of present illness  MEDICAL HISTORY:  Past Medical History:  Diagnosis Date  . Angiolipoma of kidney    followed with serial CTs ,,  Dr.  Jacqlyn Larsen  . Arthritis   . Cancer ([place])    colon  . Chemotherapy induced diarrhea   . Hammer toe   . Hypertension   . Neuro-endocrine carcinoma ([place])   . Obesity (BMI 30-39.9)     SURGICAL HISTORY: Past Surgical History:  Procedure Laterality Date  . ABDOMINAL HYSTERECTOMY  1995   endometriosis, heavy bleeding  . BUNIONECTOMY Bilateral   . CESAREAN SECTION  1985  . COLONOSCOPY WITH PROPOFOL N/A 10/09/2015   Procedure: COLONOSCOPY WITH PROPOFOL;  Surgeon: Robert Bellow, MD;  Location: [facility] ENDOSCOPY;  Service: Endoscopy;  Laterality: N/A;  . IR GENERIC HISTORICAL  04/07/2016   IR RADIOLOGIST EVAL & MGMT 04/07/2016 Arne Cleveland, MD [facility] INTERV RAD  . OOPHORECTOMY    . PORTACATH PLACEMENT Left 10/01/2015   Procedure: INSERTION PORT-A-CATH;  Surgeon: Robert Bellow, MD;  Location: [facility] ORS;  Service: General;  Laterality: Left;  . ROTATOR CUFF REPAIR Right 2008   right shoulder.  [place]     SOCIAL HISTORY: She lives at home with her family in [place]. 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 Social History   Social History  . Marital status: Married    Spouse name: N/A  . Number of children: N/A  . Years of education: N/A   Occupational History  . Not on file.   Social History Main Topics  . Smoking status: Never Smoker  . Smokeless tobacco: Never Used  .  Alcohol use 0.0 oz/week     Comment: occasionally  . Drug use: No  . Sexual activity: Not Currently   Other Topics Concern  . Not on file   Social History Narrative  . No narrative on file    FAMILY HISTORY: Family History  Problem Relation Age of Onset  . Mental illness Mother 77    bipolar, dementia  . Cancer Maternal Aunt 70    breast ca  . Hypertension Father   . Stroke Father 67    deceased  . COPD Father   . Heart disease Sister     deceased    ALLERGIES:  is allergic to bee venom and morphine and related.  MEDICATIONS:  Current Outpatient  Prescriptions  Medication Sig Dispense Refill  . ALPRAZolam (XANAX) 0.25 MG tablet Take 1 tablet (0.25 mg total) by mouth 2 (two) times daily as needed for anxiety or sleep. 60 tablet 3  . amLODipine (NORVASC) 5 MG tablet TAKE ONE TABLET BY MOUTH EVERY DAY 30 tablet 3  . diphenoxylate-atropine (LOMOTIL) 2.5-0.025 MG tablet Take 1 tablet by mouth 4 (four) times daily as needed for diarrhea or loose stools. 30 tablet 6  . lidocaine-prilocaine (EMLA) cream Apply 1 application topically as needed. Apply to port a cath site 1 hour prior to chemotherapy treatments 30 g 6  . Multiple Vitamins-Minerals (MULTIVITAMIN ADULT PO) Take 1 tablet by mouth daily.     Marland Kitchen olmesartan (BENICAR) 40 MG tablet Take 1 tablet (40 mg total) by mouth daily. 30 tablet 2  . potassium chloride (K-DUR) 10 MEQ tablet Take 1 tablet (10 mEq total) by mouth 2 (two) times daily. 60 tablet 3  . ondansetron (ZOFRAN) 8 MG tablet Take 1 tablet (8 mg total) by mouth every 6 (six) hours as needed for nausea or vomiting. (Patient not taking: Reported on 08/09/2016) 30 tablet 3  . prochlorperazine (COMPAZINE) 10 MG tablet Take 1 tablet (10 mg total) by mouth every 6 (six) hours as needed for nausea or vomiting. (Patient not taking: Reported on 08/09/2016) 30 tablet 6   No current facility-administered medications for this visit.    Facility-Administered Medications Ordered in Other Visits  Medication Dose Route Frequency Provider Last Rate Last Dose  . fluorouracil (ADRUCIL) 4,750 mg in sodium chloride 0.9 % 55 mL chemo infusion  2,400 mg/m2 (Treatment Plan Recorded) Intravenous 1 day or 1 dose [name], MD   4,750 mg at 08/09/16 1441  . heparin lock flush 100 unit/mL  500 Units Intravenous Once [name], MD      . heparin lock flush 100 unit/mL  500 Units Intravenous Once [name], MD      . heparin lock flush 100 unit/mL  500 Units Intracatheter Once PRN [name], MD      . sodium chloride  flush (NS) 0.9 % injection 10 mL  10 mL Intravenous PRN [name], MD   10 mL at 11/24/15 0836  . sodium chloride flush (NS) 0.9 % injection 10 mL  10 mL Intravenous PRN [name], MD   10 mL at 08/09/16 0918      .  PHYSICAL EXAMINATION: ECOG PERFORMANCE STATUS: 0 - Asymptomatic  Vitals:   08/09/16 1025  BP: (!) 146/95  Pulse: 80  Temp: 97.4 F (36.3 C)   Filed Weights   08/09/16 1025  Weight: 198 lb 2 oz (89.9 kg)    GENERAL: Well-nourished well-developed; Alert, no distress and comfortable. She is  with daughter.  EYES: no pallor or icterus OROPHARYNX: no thrush or ulceration; good dentition  NECK: supple, no masses felt LYMPH:  no palpable lymphadenopathy in the cervical, axillary or inguinal regions LUNGS: clear to auscultation and  No wheeze or crackles HEART/CVS: regular rate & rhythm and no murmurs; No lower extremity edema ABDOMEN: abdomen soft, non-tender and normal bowel sounds; positive for hepatomegaly. Musculoskeletal:no cyanosis of digits and no clubbing  PSYCH: alert & oriented x 3 with fluent speech NEURO: no focal motor/sensory deficits SKIN:  no rashes or significant lesions; Mediport is in place. Positive for alopecia.  LABORATORY DATA:  I have reviewed the data as listed Lab Results  Component Value Date   WBC 3.7 08/09/2016   HGB 12.3 08/09/2016   HCT 35.5 08/09/2016   MCV 100.5 (H) 08/09/2016   PLT 332 08/09/2016    Recent Labs  06/28/16 0832 07/19/16 0915 08/09/16 0918  NA 137 136 135  K 3.6 3.6 4.1  CL 109 108 106  CO2 23 21* 23  GLUCOSE 76 87 92  BUN 14 19 19   CREATININE 0.90 0.94 0.97  CALCIUM 8.8* 8.8* 9.0  GFRNONAA >60 >60 60*  GFRAA >60 >60 >60  PROT 7.6 7.7 8.3*  ALBUMIN 3.6 3.8 3.9  AST 30 30 26   ALT 20 22 21   ALKPHOS 93 82 95  BILITOT 0.7 0.4 0.5    RADIOGRAPHIC STUDIES: I have personally reviewed the radiological images as listed and agreed with the findings in the report. 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 Followup study. EXAM: CT ABDOMEN AND PELVIS WITH CONTRAST TECHNIQUE: Multidetector CT imaging of the abdomen and pelvis was performed using the standard protocol following bolus administration of intravenous contrast. CONTRAST:  143mL ISOVUE-300 IOPAMIDOL (ISOVUE-300) INJECTION 61% COMPARISON:  CT the abdomen and pelvis 03/25/2016. FINDINGS: Lower chest: Calcification of the mitral annulus. Small hiatal hernia. Hepatobiliary: All previously noted hypovascular lesions in the liver appeared decreased in size. This is best demonstrated by the largest lesion in the periphery of segments 7 and 8, which currently measures only 4.6 x 3.1 cm (axial image 12 of series 2) as compared with 5.2 x 3.7 cm on prior study 03/25/2016. No definite new suspicious appearing hepatic lesions are noted. Small hypervascular lesion measuring 1.7 cm in segment 6, similar to prior studies, likely a small cavernous hemangioma. Other small low-attenuation lesions likely reflect tiny cysts (although several are too small to definitively characterize), with the largest of these measuring up to 1.3 cm in segment 3. No intra or extrahepatic biliary ductal dilatation. Gallbladder is normal in appearance. Pancreas: No pancreatic mass. No pancreatic ductal dilatation. No pancreatic or peripancreatic fluid or inflammatory changes. Spleen: Unremarkable. Adrenals/Urinary Tract: 2.5 x 1.4 cm heterogeneously enhancing lesion extending off the posterior aspect of the lower pole the right kidney is unchanged compared to the prior examination, and very similar to numerous more remote prior studies, previously demonstrating internal fatty attenuation, presumably an angiomyolipoma. Left kidney and bilateral adrenal glands are  normal in appearance. No hydroureteronephrosis. Urinary bladder is normal in appearance. Stomach/Bowel: The appearance of the stomach is normal. No pathologic dilatation of small bowel or colon. A few scattered colonic diverticulae are noted, without surrounding inflammatory changes to suggest an acute diverticulitis at this time. Normal appendix. Mild subjective thickening and of  the cecum appears slightly decreased compared to the prior examination. Vascular/Lymphatic: Aortic atherosclerosis, without evidence of aneurysm or dissection in the abdominal or pelvic vasculature. There are several borderline enlarged and mildly enlarged ileocolic lymph nodes measuring up to 13 mm in short axis. Reproductive: Status post hysterectomy. Ovaries are not confidently identified may be surgically absent or atrophic. Other: No significant volume of ascites.  No pneumoperitoneum. Musculoskeletal: There are no aggressive appearing lytic or blastic lesions noted in the visualized portions of the skeleton. IMPRESSION: 1. Today's study demonstrates a positive response to therapy. Previously noted ileocolic lymphadenopathy is decreased compared to the prior study, and multiple known hepatic metastases also decreased in size compared to the prior examination. No new metastatic disease is identified in the abdomen or pelvis. 2. Colonic diverticulosis, without evidence of acute diverticulitis at this time. 3. Aortic atherosclerosis. 4. Additional incidental findings, as above. Electronically Signed   By: Vinnie Langton M.D.   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 Clinically no evidence of progression noted.; Improved/CEA- trending down [90]; jan 2018- CEA 94. [stable]  # proceed with FOLFIRI + Avastin cycle # 12  Today. Labs reviewed  acceptable ANC 1.3.  # Blood pressure Elevation- sec to Avastin/anxiety-improved; res-start avastin today.   cont benicar/hctx;reheck again at home. Better at home.   # GERD/ reflux- on mylanta prn. Trial of PPI.   # PN- G-1 monitor for now.   #  follow-up with me in 3 weeks/ chemo/UA/labs.    [name], MD 08/09/2016 4:50 PM

## 2016-08-10 LAB — CEA: CEA: 141.5 ng/mL — ABNORMAL HIGH (ref 0.0–4.7)

## 2016-08-11 ENCOUNTER — Inpatient Hospital Stay: Payer: Medicare HMO

## 2016-08-11 VITALS — BP 136/86 | HR 80 | Temp 98.0°F

## 2016-08-11 DIAGNOSIS — C7A8 Other malignant neuroendocrine tumors: Secondary | ICD-10-CM

## 2016-08-11 DIAGNOSIS — C182 Malignant neoplasm of ascending colon: Secondary | ICD-10-CM

## 2016-08-11 DIAGNOSIS — C189 Malignant neoplasm of colon, unspecified: Secondary | ICD-10-CM

## 2016-08-11 DIAGNOSIS — C799 Secondary malignant neoplasm of unspecified site: Secondary | ICD-10-CM

## 2016-08-11 DIAGNOSIS — Z5111 Encounter for antineoplastic chemotherapy: Secondary | ICD-10-CM | POA: Diagnosis not present

## 2016-08-11 MED ORDER — HEPARIN SOD (PORK) LOCK FLUSH 100 UNIT/ML IV SOLN
500.0000 [IU] | Freq: Once | INTRAVENOUS | Status: AC | PRN
  Administered 2016-08-11: 500 [IU]
  Filled 2016-08-11: qty 5

## 2016-08-11 MED ORDER — SODIUM CHLORIDE 0.9% FLUSH
10.0000 mL | INTRAVENOUS | Status: DC | PRN
  Administered 2016-08-11: 10 mL
  Filled 2016-08-11: qty 10

## 2016-08-16 ENCOUNTER — Other Ambulatory Visit: Payer: Self-pay | Admitting: Internal Medicine

## 2016-08-16 DIAGNOSIS — K521 Toxic gastroenteritis and colitis: Secondary | ICD-10-CM

## 2016-08-16 DIAGNOSIS — T451X5A Adverse effect of antineoplastic and immunosuppressive drugs, initial encounter: Principal | ICD-10-CM

## 2016-08-25 DIAGNOSIS — C799 Secondary malignant neoplasm of unspecified site: Secondary | ICD-10-CM | POA: Diagnosis not present

## 2016-08-25 DIAGNOSIS — C7A8 Other malignant neuroendocrine tumors: Secondary | ICD-10-CM | POA: Diagnosis not present

## 2016-08-30 ENCOUNTER — Inpatient Hospital Stay: Payer: Medicare HMO

## 2016-08-30 ENCOUNTER — Inpatient Hospital Stay: Payer: Medicare HMO | Attending: Internal Medicine | Admitting: Internal Medicine

## 2016-08-30 VITALS — BP 140/96 | HR 94

## 2016-08-30 VITALS — BP 147/94 | HR 85 | Temp 97.1°F | Resp 18 | Ht 64.0 in | Wt 200.1 lb

## 2016-08-30 DIAGNOSIS — D1771 Benign lipomatous neoplasm of kidney: Secondary | ICD-10-CM | POA: Insufficient documentation

## 2016-08-30 DIAGNOSIS — T451X5A Adverse effect of antineoplastic and immunosuppressive drugs, initial encounter: Secondary | ICD-10-CM

## 2016-08-30 DIAGNOSIS — Z5111 Encounter for antineoplastic chemotherapy: Secondary | ICD-10-CM | POA: Diagnosis not present

## 2016-08-30 DIAGNOSIS — I700 Atherosclerosis of aorta: Secondary | ICD-10-CM | POA: Diagnosis not present

## 2016-08-30 DIAGNOSIS — Z95828 Presence of other vascular implants and grafts: Secondary | ICD-10-CM

## 2016-08-30 DIAGNOSIS — M5137 Other intervertebral disc degeneration, lumbosacral region: Secondary | ICD-10-CM | POA: Diagnosis not present

## 2016-08-30 DIAGNOSIS — C787 Secondary malignant neoplasm of liver and intrahepatic bile duct: Secondary | ICD-10-CM | POA: Diagnosis not present

## 2016-08-30 DIAGNOSIS — C799 Secondary malignant neoplasm of unspecified site: Secondary | ICD-10-CM

## 2016-08-30 DIAGNOSIS — Z5112 Encounter for antineoplastic immunotherapy: Secondary | ICD-10-CM | POA: Diagnosis not present

## 2016-08-30 DIAGNOSIS — M129 Arthropathy, unspecified: Secondary | ICD-10-CM | POA: Diagnosis not present

## 2016-08-30 DIAGNOSIS — R590 Localized enlarged lymph nodes: Secondary | ICD-10-CM | POA: Insufficient documentation

## 2016-08-30 DIAGNOSIS — F419 Anxiety disorder, unspecified: Secondary | ICD-10-CM | POA: Diagnosis not present

## 2016-08-30 DIAGNOSIS — C7A8 Other malignant neuroendocrine tumors: Secondary | ICD-10-CM

## 2016-08-30 DIAGNOSIS — R112 Nausea with vomiting, unspecified: Secondary | ICD-10-CM | POA: Diagnosis not present

## 2016-08-30 DIAGNOSIS — D171 Benign lipomatous neoplasm of skin and subcutaneous tissue of trunk: Secondary | ICD-10-CM

## 2016-08-30 DIAGNOSIS — I10 Essential (primary) hypertension: Secondary | ICD-10-CM | POA: Diagnosis not present

## 2016-08-30 DIAGNOSIS — C182 Malignant neoplasm of ascending colon: Secondary | ICD-10-CM

## 2016-08-30 DIAGNOSIS — Z79899 Other long term (current) drug therapy: Secondary | ICD-10-CM | POA: Insufficient documentation

## 2016-08-30 DIAGNOSIS — M8580 Other specified disorders of bone density and structure, unspecified site: Secondary | ICD-10-CM | POA: Insufficient documentation

## 2016-08-30 DIAGNOSIS — R69 Illness, unspecified: Secondary | ICD-10-CM | POA: Diagnosis not present

## 2016-08-30 DIAGNOSIS — C189 Malignant neoplasm of colon, unspecified: Secondary | ICD-10-CM

## 2016-08-30 DIAGNOSIS — K219 Gastro-esophageal reflux disease without esophagitis: Secondary | ICD-10-CM | POA: Diagnosis not present

## 2016-08-30 LAB — URINALYSIS, COMPLETE (UACMP) WITH MICROSCOPIC
Bilirubin Urine: NEGATIVE
Glucose, UA: NEGATIVE mg/dL
KETONES UR: NEGATIVE mg/dL
NITRITE: NEGATIVE
PROTEIN: NEGATIVE mg/dL
Specific Gravity, Urine: 1.006 (ref 1.005–1.030)
pH: 5 (ref 5.0–8.0)

## 2016-08-30 LAB — COMPREHENSIVE METABOLIC PANEL
ALBUMIN: 3.8 g/dL (ref 3.5–5.0)
ALK PHOS: 87 U/L (ref 38–126)
ALT: 19 U/L (ref 14–54)
ANION GAP: 6 (ref 5–15)
AST: 28 U/L (ref 15–41)
BUN: 15 mg/dL (ref 6–20)
CALCIUM: 9 mg/dL (ref 8.9–10.3)
CHLORIDE: 107 mmol/L (ref 101–111)
CO2: 22 mmol/L (ref 22–32)
CREATININE: 0.92 mg/dL (ref 0.44–1.00)
GFR calc Af Amer: >60 mL/min (ref >60)
GFR calc non Af Amer: >60 mL/min (ref >60)
Glucose, Bld: 78 mg/dL (ref 65–99)
Potassium: 3.7 mmol/L (ref 3.5–5.1)
SODIUM: 135 mmol/L (ref 135–145)
Total Bilirubin: 0.2 mg/dL — ABNORMAL LOW (ref 0.3–1.2)
Total Protein: 8 g/dL (ref 6.5–8.1)

## 2016-08-30 LAB — CBC WITH DIFFERENTIAL/PLATELET
Basophils Absolute: 0 10*3/uL (ref 0–0.1)
Basophils Relative: 1 %
EOS ABS: 0.2 10*3/uL (ref 0–0.7)
Eosinophils Relative: 5 %
HCT: 35.5 % (ref 35.0–47.0)
HEMOGLOBIN: 12.4 g/dL (ref 12.0–16.0)
Lymphocytes Relative: 47 %
Lymphs Abs: 1.6 10*3/uL (ref 1.0–3.6)
MCH: 34.8 pg — ABNORMAL HIGH (ref 26.0–34.0)
MCHC: 35 g/dL (ref 32.0–36.0)
MCV: 99.4 fL (ref 80.0–100.0)
Monocytes Absolute: 0.4 10*3/uL (ref 0.2–0.9)
Monocytes Relative: 12 %
NEUTROS PCT: 35 %
Neutro Abs: 1.2 10*3/uL — ABNORMAL LOW (ref 1.4–6.5)
Platelets: 327 10*3/uL (ref 150–440)
RBC: 3.57 MIL/uL — AB (ref 3.80–5.20)
RDW: 14.5 % (ref 11.5–14.5)
WBC: 3.4 10*3/uL — AB (ref 3.6–11.0)

## 2016-08-30 LAB — MAGNESIUM: Magnesium: 1.8 mg/dL (ref 1.7–2.4)

## 2016-08-30 MED ORDER — LIDOCAINE-PRILOCAINE 2.5-2.5 % EX CREA: 1.0000 "application " | TOPICAL_CREAM | CUTANEOUS | 6 refills | Status: AC | PRN

## 2016-08-30 MED ORDER — DEXAMETHASONE SODIUM PHOSPHATE 10 MG/ML IJ SOLN: 10.0000 mg | Freq: Once | INTRAMUSCULAR | Status: DC

## 2016-08-30 MED ORDER — LEUCOVORIN CALCIUM INJECTION 350 MG
800.0000 mg | Freq: Once | INTRAVENOUS | Status: AC
  Administered 2016-08-30: 800 mg via INTRAVENOUS
  Filled 2016-08-30: qty 40

## 2016-08-30 MED ORDER — ATROPINE SULFATE 1 MG/ML IJ SOLN
0.5000 mg | Freq: Once | INTRAMUSCULAR | Status: AC | PRN
  Administered 2016-08-30: 0.5 mg via INTRAVENOUS
  Filled 2016-08-30: qty 1

## 2016-08-30 MED ORDER — SODIUM CHLORIDE 0.9 % IV SOLN
2400.0000 mg/m2 | INTRAVENOUS | Status: DC
  Administered 2016-08-30: 4750 mg via INTRAVENOUS
  Filled 2016-08-30: qty 95

## 2016-08-30 MED ORDER — LORAZEPAM 2 MG/ML IJ SOLN
1.0000 mg | Freq: Once | INTRAMUSCULAR | Status: AC
  Administered 2016-08-30: 1 mg via INTRAVENOUS
  Filled 2016-08-30: qty 1

## 2016-08-30 MED ORDER — SODIUM CHLORIDE 0.9 % IV SOLN
Freq: Once | INTRAVENOUS | Status: AC
  Administered 2016-08-30: 11:00:00 via INTRAVENOUS
  Filled 2016-08-30: qty 5

## 2016-08-30 MED ORDER — SODIUM CHLORIDE 0.9% FLUSH
10.0000 mL | INTRAVENOUS | Status: DC | PRN
  Administered 2016-08-30: 10 mL
  Filled 2016-08-30: qty 10

## 2016-08-30 MED ORDER — IRINOTECAN HCL CHEMO INJECTION 100 MG/5ML
140.0000 mg/m2 | Freq: Once | INTRAVENOUS | Status: AC
  Administered 2016-08-30: 280 mg via INTRAVENOUS
  Filled 2016-08-30: qty 14

## 2016-08-30 MED ORDER — SODIUM CHLORIDE 0.9 % IV SOLN
Freq: Once | INTRAVENOUS | Status: AC
  Administered 2016-08-30: 10:00:00 via INTRAVENOUS
  Filled 2016-08-30: qty 1000

## 2016-08-30 MED ORDER — SODIUM CHLORIDE 0.9 % IV SOLN
450.0000 mg | Freq: Once | INTRAVENOUS | Status: AC
  Administered 2016-08-30: 450 mg via INTRAVENOUS
  Filled 2016-08-30: qty 16

## 2016-08-30 MED ORDER — PALONOSETRON HCL INJECTION 0.25 MG/5ML
0.2500 mg | Freq: Once | INTRAVENOUS | Status: AC
  Administered 2016-08-30: 0.25 mg via INTRAVENOUS
  Filled 2016-08-30: qty 5

## 2016-08-30 NOTE — Assessment & Plan Note (Addendum)
Metastatic neuroendocrine carcinoma of the colon with metastasis to liver on FOLFIRI + Avastin; JAN 25th, 2018  CT scan shows improved response. Clinically no evidence of progression noted.; Improved/CEA- trending down [90]; jan 2018- CEA 140 [slight trend up]; await CEA today- if significantly up- then recommend CT prior to next visit; or else will order CT at next visit.   # proceed with FOLFIRI + Avastin cycle # 13  Today. Labs reviewed acceptable ANC 1.2.   # Blood pressure Elevation- sec to Avastin/anxiety-improved [140s/94s]   cont benicar/hctx;  # GERD/ reflux- improved on PPI.   # PN- G-1 monitor for now.   #  follow-up with me in 3 weeks/ chemo/UA/labs.

## 2016-08-30 NOTE — Progress Notes (Signed)
[place] NOTE  [name], [name], [name], [name], [name]; [name]; [name]; [name]; [name]; [name]; [name]; [name]; July 14th  2017 CT- PR  # AUG 28th- FOLFIRI +Avastin; OCT 5th CT scan- PR.   # right kidney lesion 2.6 x 1.6 cm ? 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  Patient's fatigue is improved. No cramping in the legs no tingling and numbness. No swelling in the legs.  Denies any abdominal pain and diarrhea. Denies any significant headaches or epistaxis. Denies any skin rash. No fevers or chills. No sores in the mouth. No chest pain or shortness of the cough. Reflux improved on Prilosec.  ROS: A complete 10 point review of system is done which is negative except mentioned above in history of present illness  MEDICAL HISTORY:  Past Medical History:  Diagnosis Date  . Angiolipoma of kidney    followed with serial CTs ,,  Dr. Jacqlyn Larsen  . Arthritis   . Cancer ([place])    colon  . Chemotherapy induced diarrhea    . Hammer toe   . Hypertension   . Neuro-endocrine carcinoma ([place])   . Obesity (BMI 30-39.9)     SURGICAL HISTORY: Past Surgical History:  Procedure Laterality Date  . ABDOMINAL HYSTERECTOMY  1995   endometriosis, heavy bleeding  . BUNIONECTOMY Bilateral   . CESAREAN SECTION  1985  . COLONOSCOPY WITH PROPOFOL N/A 10/09/2015   Procedure: COLONOSCOPY WITH PROPOFOL;  Surgeon: Robert Bellow, MD;  Location: [facility] ENDOSCOPY;  Service: Endoscopy;  Laterality: N/A;  . IR GENERIC HISTORICAL  04/07/2016   IR RADIOLOGIST EVAL & MGMT 04/07/2016 Arne Cleveland, MD [facility] INTERV RAD  . OOPHORECTOMY    . PORTACATH PLACEMENT Left 10/01/2015   Procedure: INSERTION PORT-A-CATH;  Surgeon: Robert Bellow, MD;  Location: [facility] ORS;  Service: General;  Laterality: Left;  . ROTATOR CUFF REPAIR Right 2008   right shoulder.  [place]     SOCIAL HISTORY: She lives at home with her family in [place]. 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 Social History   Social History  . Marital status: Married    Spouse name: N/A  . Number of children: N/A  . Years of education: N/A   Occupational History  . Not on file.   Social History Main Topics  . Smoking status: Never Smoker  . Smokeless tobacco: Never Used  . Alcohol use 0.0 oz/week     Comment: occasionally  . Drug  use: No  . Sexual activity: Not Currently   Other Topics Concern  . Not on file   Social History Narrative  . No narrative on file    FAMILY HISTORY: Family History  Problem Relation Age of Onset  . Mental illness Mother 81    bipolar, dementia  . Cancer Maternal Aunt 70    breast ca  . Hypertension Father   . Stroke Father 69    deceased  . COPD Father   . Heart disease Sister     deceased    ALLERGIES:  is allergic to bee venom and morphine and related.  MEDICATIONS:  Current Outpatient Prescriptions  Medication Sig Dispense Refill  . ALPRAZolam (XANAX) 0.25 MG tablet  Take 1 tablet (0.25 mg total) by mouth 2 (two) times daily as needed for anxiety or sleep. (Patient taking differently: Take 0.25 mg by mouth 2 (two) times daily as needed for anxiety or sleep. Patient scores the tablet in half-) 60 tablet 3  . amLODipine (NORVASC) 5 MG tablet TAKE ONE TABLET BY MOUTH EVERY DAY 30 tablet 3  . lidocaine-prilocaine (EMLA) cream Apply 1 application topically as needed. Apply to port a cath site 1 hour prior to chemotherapy treatments 30 g 6  . loperamide (IMODIUM A-D) 2 MG tablet Take 2 mg by mouth 4 (four) times daily as needed for diarrhea or loose stools.    . Multiple Vitamins-Minerals (MULTIVITAMIN ADULT PO) Take 1 tablet by mouth daily.     Marland Kitchen olmesartan (BENICAR) 40 MG tablet Take 1 tablet (40 mg total) by mouth daily. 30 tablet 2  . potassium chloride (K-DUR) 10 MEQ tablet Take 1 tablet (10 mEq total) by mouth 2 (two) times daily. 60 tablet 3  . diphenoxylate-atropine (LOMOTIL) 2.5-0.025 MG tablet TAKE 1 TABLET FOUR TIMES DAILY AS NEEDEDFOR DIARRHEA OR LOOSE STOOLS (Patient not taking: Reported on 08/30/2016) 30 tablet 3  . ondansetron (ZOFRAN) 8 MG tablet Take 1 tablet (8 mg total) by mouth every 6 (six) hours as needed for nausea or vomiting. (Patient not taking: Reported on 08/09/2016) 30 tablet 3  . prochlorperazine (COMPAZINE) 10 MG tablet Take 1 tablet (10 mg total) by mouth every 6 (six) hours as needed for nausea or vomiting. (Patient not taking: Reported on 08/09/2016) 30 tablet 6   No current facility-administered medications for this visit.    Facility-Administered Medications Ordered in Other Visits  Medication Dose Route Frequency Provider Last Rate Last Dose  . dexamethasone (DECADRON) injection 10 mg  10 mg Intravenous Once [name], MD      . fluorouracil (ADRUCIL) 4,750 mg in sodium chloride 0.9 % 55 mL chemo infusion  2,400 mg/m2 (Treatment Plan Recorded) Intravenous 1 day or 1 dose [name], MD   4,750 mg at 08/30/16 1354   . heparin lock flush 100 unit/mL  500 Units Intravenous Once [name], MD      . sodium chloride flush (NS) 0.9 % injection 10 mL  10 mL Intravenous PRN [name], MD   10 mL at 11/24/15 0836  . sodium chloride flush (NS) 0.9 % injection 10 mL  10 mL Intracatheter PRN [name], MD   10 mL at 08/30/16 0945      .  PHYSICAL EXAMINATION: ECOG PERFORMANCE STATUS: 0 - Asymptomatic  Vitals:   08/30/16 0918  BP: (!) 147/94  Pulse: 85  Resp: 18  Temp: 97.1 F (36.2 C)   Filed Weights   08/30/16 7793  Weight: 200 lb 1.6 oz (90.8 kg)    GENERAL: Well-nourished well-developed; Alert, no distress and comfortable. She is with daughter.  EYES: no pallor or icterus OROPHARYNX: no thrush or ulceration; good dentition  NECK: supple, no masses felt LYMPH:  no palpable lymphadenopathy in the cervical, axillary or inguinal regions LUNGS: clear to auscultation and  No wheeze or crackles HEART/CVS: regular rate & rhythm and no murmurs; No lower extremity edema ABDOMEN: abdomen soft, non-tender and normal bowel sounds; positive for hepatomegaly. Musculoskeletal:no cyanosis of digits and no clubbing  PSYCH: alert & oriented x 3 with fluent speech NEURO: no focal motor/sensory deficits SKIN:  no rashes or significant lesions; Mediport is in place.   LABORATORY DATA:  I have reviewed the data as listed Lab Results  Component Value Date   WBC 3.4 (L) 08/30/2016   HGB 12.4 08/30/2016   HCT 35.5 08/30/2016   MCV 99.4 08/30/2016   PLT 327 08/30/2016    Recent Labs  07/19/16 0915 08/09/16 0918 08/30/16 0853  NA 136 135 135  K 3.6 4.1 3.7  CL 108 106 107  CO2 21* 23 22  GLUCOSE 87 92 78  BUN 19 19 15   CREATININE 0.94 0.97 0.92  CALCIUM 8.8* 9.0 9.0  GFRNONAA >60 60* >60  GFRAA >60 >60 >60  PROT 7.7 8.3* 8.0  ALBUMIN 3.8 3.9 3.8  AST 30 26 28   ALT 22 21 19   ALKPHOS 82 95 87  BILITOT 0.4 0.5 0.2*    RADIOGRAPHIC STUDIES: I have personally  reviewed the radiological images as listed and agreed with the findings in the report. No results found.  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 Clinically no evidence of progression noted.; Improved/CEA- trending down [90]; jan 2018- CEA 140 [slight trend up]; await CEA today- if significantly up- then recommend CT prior to next visit; or else will order CT at next visit.   # proceed with FOLFIRI + Avastin cycle # 13  Today. Labs reviewed acceptable ANC 1.2.   # Blood pressure Elevation- sec to Avastin/anxiety-improved [140s/94s]   cont benicar/hctx;  # GERD/ reflux- improved on PPI.   # PN- G-1 monitor for now.   #  follow-up with me in 3 weeks/ chemo/UA/labs.    [name], MD 08/30/2016 2:17 PM

## 2016-08-30 NOTE — Progress Notes (Signed)
Patient here for follow-up for colon cancer and chemotherapy.

## 2016-08-30 NOTE — Progress Notes (Signed)
Per MD notes from today:  # proceed with FOLFIRI + Avastin cycle # 13  Today. Labs reviewed acceptable ANC 1.2.

## 2016-08-31 ENCOUNTER — Telehealth: Payer: Self-pay | Admitting: *Deleted

## 2016-08-31 ENCOUNTER — Other Ambulatory Visit: Payer: Self-pay | Admitting: Internal Medicine

## 2016-08-31 DIAGNOSIS — C182 Malignant neoplasm of ascending colon: Secondary | ICD-10-CM

## 2016-08-31 LAB — CEA: CEA: 181.2 ng/mL — ABNORMAL HIGH (ref 0.0–4.7)

## 2016-08-31 NOTE — Telephone Encounter (Signed)
-----   Message from [name], MD sent at 08/31/2016 12:32 PM EDT ----- Please inform pt that CEA is going up; recommend getting CT A/P with constrast asap. This is ordered.

## 2016-08-31 NOTE — Telephone Encounter (Signed)
Left vm for patient - asked pt to call the cancer center to discuss her test resutls. Also sent mychart msg. Instructed pt that scheduling will be setting up a ct scan for her as soon as possible.

## 2016-09-01 ENCOUNTER — Inpatient Hospital Stay: Payer: Medicare HMO

## 2016-09-01 VITALS — BP 139/90 | HR 80 | Resp 18

## 2016-09-01 DIAGNOSIS — C182 Malignant neoplasm of ascending colon: Secondary | ICD-10-CM

## 2016-09-01 DIAGNOSIS — C801 Malignant (primary) neoplasm, unspecified: Secondary | ICD-10-CM

## 2016-09-01 DIAGNOSIS — C189 Malignant neoplasm of colon, unspecified: Secondary | ICD-10-CM

## 2016-09-01 DIAGNOSIS — C799 Secondary malignant neoplasm of unspecified site: Secondary | ICD-10-CM

## 2016-09-01 DIAGNOSIS — Z5111 Encounter for antineoplastic chemotherapy: Secondary | ICD-10-CM | POA: Diagnosis not present

## 2016-09-01 DIAGNOSIS — C7A8 Other malignant neuroendocrine tumors: Secondary | ICD-10-CM

## 2016-09-01 MED ORDER — SODIUM CHLORIDE 0.9% FLUSH
10.0000 mL | INTRAVENOUS | Status: DC | PRN
Start: 2016-09-01 — End: 2016-09-01
  Administered 2016-09-01: 10 mL via INTRAVENOUS
  Filled 2016-09-01: qty 10

## 2016-09-01 MED ORDER — HEPARIN SOD (PORK) LOCK FLUSH 100 UNIT/ML IV SOLN
INTRAVENOUS | Status: AC
  Filled 2016-09-01: qty 5

## 2016-09-01 MED ORDER — HEPARIN SOD (PORK) LOCK FLUSH 100 UNIT/ML IV SOLN
500.0000 [IU] | Freq: Once | INTRAVENOUS | Status: AC
  Administered 2016-09-01: 500 [IU] via INTRAVENOUS

## 2016-09-07 ENCOUNTER — Ambulatory Visit
Admission: RE | Admit: 2016-09-07 | Discharge: 2016-09-07 | Disposition: A | Discharge status: Home / Self Care | Payer: Medicare HMO | Source: Ambulatory Visit | Attending: Internal Medicine | Admitting: Internal Medicine

## 2016-09-07 ENCOUNTER — Telehealth: Payer: Self-pay | Admitting: Internal Medicine

## 2016-09-07 ENCOUNTER — Other Ambulatory Visit: Payer: Self-pay | Admitting: Internal Medicine

## 2016-09-07 ENCOUNTER — Ambulatory Visit: Admission: RE | Admit: 2016-09-07 | Payer: Medicare HMO | Source: Ambulatory Visit

## 2016-09-07 DIAGNOSIS — R590 Localized enlarged lymph nodes: Secondary | ICD-10-CM | POA: Insufficient documentation

## 2016-09-07 DIAGNOSIS — C189 Malignant neoplasm of colon, unspecified: Secondary | ICD-10-CM | POA: Diagnosis not present

## 2016-09-07 DIAGNOSIS — C182 Malignant neoplasm of ascending colon: Secondary | ICD-10-CM | POA: Diagnosis not present

## 2016-09-07 DIAGNOSIS — C787 Secondary malignant neoplasm of liver and intrahepatic bile duct: Secondary | ICD-10-CM | POA: Insufficient documentation

## 2016-09-07 DIAGNOSIS — I700 Atherosclerosis of aorta: Secondary | ICD-10-CM | POA: Diagnosis not present

## 2016-09-07 MED ORDER — IOPAMIDOL (ISOVUE-300) INJECTION 61%
100.0000 mL | Freq: Once | INTRAVENOUS | Status: AC | PRN
  Administered 2016-09-07: 100 mL via INTRAVENOUS

## 2016-09-07 NOTE — Telephone Encounter (Signed)
Per schedulers in infusion chairs available for 3/26 will need to schedule on 3/28

## 2016-09-07 NOTE — Telephone Encounter (Signed)
I called pt to talk to her re: scans.   Reviewed the CT scan- patient appears to have progression of the primary disease in the cecum. Left a message for Dr. Bary Castilla.   # Recommend changing chemotherapy to FOLFOX every 2 weeks; plans starting chemotherapy on 3/26; M.D. appointment CBC CMP CEA. HOLD avastin for now. New orders in.   # H/J- please inform patient of above if she calls back.   Dr.B

## 2016-09-07 NOTE — Progress Notes (Signed)
DISCONTINUE OFF PATHWAY REGIMEN - [Other Dx]   [id number]:FOLFIRI (q14d):   A cycle is every 14 days:     Irinotecan        Dose Mod: None     Leucovorin        Dose Mod: None     5-Fluorouracil        Dose Mod: None     5-Fluorouracil        Dose Mod: None  **Always confirm dose/schedule in your pharmacy ordering system**    REASON: Disease Progression PRIOR TREATMENT: FOLFIRI (q14d) TREATMENT RESPONSE: Progressive Disease (PD)  START OFF PATHWAY REGIMEN - [Other Dx]   [id number]:UDTHYH (q14d):   A cycle is every 14 days:     Oxaliplatin      Leucovorin      5-Fluorouracil      5-Fluorouracil   **Always confirm dose/schedule in your pharmacy ordering system**    Intent of Therapy: Non-Curative / Palliative Intent, Discussed with Patient

## 2016-09-07 NOTE — Telephone Encounter (Addendum)
Contacted patient via cell phone #. - left vm on pt's cell phone to request for return phone call.  Unable to leave vm on pt's home phone- ph. Just rings.   Will send mychart msg.

## 2016-09-08 ENCOUNTER — Telehealth: Payer: Self-pay | Admitting: *Deleted

## 2016-09-08 NOTE — Telephone Encounter (Signed)
Spoke with patient regarding her ct scan test results. Reviewed plan of care with patient and also communicated via mychart to patient. She thanked me for calling her back.

## 2016-09-13 ENCOUNTER — Inpatient Hospital Stay: Payer: Medicare HMO

## 2016-09-13 ENCOUNTER — Inpatient Hospital Stay (HOSPITAL_BASED_OUTPATIENT_CLINIC_OR_DEPARTMENT_OTHER): Payer: Medicare HMO | Admitting: Internal Medicine

## 2016-09-13 ENCOUNTER — Ambulatory Visit: Payer: Medicare HMO

## 2016-09-13 VITALS — BP 137/82 | HR 80 | Temp 97.5°F | Resp 18

## 2016-09-13 VITALS — BP 162/97

## 2016-09-13 DIAGNOSIS — R590 Localized enlarged lymph nodes: Secondary | ICD-10-CM

## 2016-09-13 DIAGNOSIS — I700 Atherosclerosis of aorta: Secondary | ICD-10-CM

## 2016-09-13 DIAGNOSIS — C189 Malignant neoplasm of colon, unspecified: Secondary | ICD-10-CM

## 2016-09-13 DIAGNOSIS — R112 Nausea with vomiting, unspecified: Secondary | ICD-10-CM

## 2016-09-13 DIAGNOSIS — F419 Anxiety disorder, unspecified: Secondary | ICD-10-CM | POA: Diagnosis not present

## 2016-09-13 DIAGNOSIS — R69 Illness, unspecified: Secondary | ICD-10-CM | POA: Diagnosis not present

## 2016-09-13 DIAGNOSIS — Z79899 Other long term (current) drug therapy: Secondary | ICD-10-CM

## 2016-09-13 DIAGNOSIS — M129 Arthropathy, unspecified: Secondary | ICD-10-CM

## 2016-09-13 DIAGNOSIS — M5137 Other intervertebral disc degeneration, lumbosacral region: Secondary | ICD-10-CM | POA: Diagnosis not present

## 2016-09-13 DIAGNOSIS — K219 Gastro-esophageal reflux disease without esophagitis: Secondary | ICD-10-CM

## 2016-09-13 DIAGNOSIS — C799 Secondary malignant neoplasm of unspecified site: Secondary | ICD-10-CM

## 2016-09-13 DIAGNOSIS — I10 Essential (primary) hypertension: Secondary | ICD-10-CM | POA: Diagnosis not present

## 2016-09-13 DIAGNOSIS — C182 Malignant neoplasm of ascending colon: Secondary | ICD-10-CM

## 2016-09-13 DIAGNOSIS — M8580 Other specified disorders of bone density and structure, unspecified site: Secondary | ICD-10-CM | POA: Diagnosis not present

## 2016-09-13 DIAGNOSIS — D1771 Benign lipomatous neoplasm of kidney: Secondary | ICD-10-CM

## 2016-09-13 DIAGNOSIS — C787 Secondary malignant neoplasm of liver and intrahepatic bile duct: Secondary | ICD-10-CM

## 2016-09-13 DIAGNOSIS — Z5111 Encounter for antineoplastic chemotherapy: Secondary | ICD-10-CM

## 2016-09-13 DIAGNOSIS — C7A8 Other malignant neuroendocrine tumors: Secondary | ICD-10-CM | POA: Diagnosis not present

## 2016-09-13 LAB — COMPREHENSIVE METABOLIC PANEL
ALBUMIN: 3.5 g/dL (ref 3.5–5.0)
ALT: 19 U/L (ref 14–54)
AST: 24 U/L (ref 15–41)
Alkaline Phosphatase: 80 U/L (ref 38–126)
Anion gap: 4 — ABNORMAL LOW (ref 5–15)
BILIRUBIN TOTAL: 0.4 mg/dL (ref 0.3–1.2)
BUN: 20 mg/dL (ref 6–20)
CHLORIDE: 108 mmol/L (ref 101–111)
CO2: 24 mmol/L (ref 22–32)
Calcium: 8.8 mg/dL — ABNORMAL LOW (ref 8.9–10.3)
Creatinine, Ser: 1.01 mg/dL — ABNORMAL HIGH (ref 0.44–1.00)
GFR calc Af Amer: >60 mL/min (ref >60)
GFR calc non Af Amer: 57 mL/min — ABNORMAL LOW (ref >60)
GLUCOSE: 79 mg/dL (ref 65–99)
POTASSIUM: 3.9 mmol/L (ref 3.5–5.1)
Sodium: 136 mmol/L (ref 135–145)
TOTAL PROTEIN: 7.5 g/dL (ref 6.5–8.1)

## 2016-09-13 LAB — CBC WITH DIFFERENTIAL/PLATELET
BASOS ABS: 0 10*3/uL (ref 0–0.1)
BASOS PCT: 1 %
Eosinophils Absolute: 0.2 10*3/uL (ref 0–0.7)
Eosinophils Relative: 3 %
HEMATOCRIT: 34.7 % — AB (ref 35.0–47.0)
Hemoglobin: 12.1 g/dL (ref 12.0–16.0)
Lymphocytes Relative: 27 %
Lymphs Abs: 1.4 10*3/uL (ref 1.0–3.6)
MCH: 34.5 pg — ABNORMAL HIGH (ref 26.0–34.0)
MCHC: 34.9 g/dL (ref 32.0–36.0)
MCV: 98.8 fL (ref 80.0–100.0)
MONO ABS: 0.4 10*3/uL (ref 0.2–0.9)
Monocytes Relative: 8 %
NEUTROS ABS: 3.2 10*3/uL (ref 1.4–6.5)
NEUTROS PCT: 61 %
Platelets: 272 10*3/uL (ref 150–440)
RBC: 3.51 MIL/uL — AB (ref 3.80–5.20)
RDW: 14.6 % — AB (ref 11.5–14.5)
WBC: 5.1 10*3/uL (ref 3.6–11.0)

## 2016-09-13 LAB — URINALYSIS, COMPLETE (UACMP) WITH MICROSCOPIC
BILIRUBIN URINE: NEGATIVE
Glucose, UA: NEGATIVE mg/dL
Ketones, ur: NEGATIVE mg/dL
Nitrite: NEGATIVE
Protein, ur: NEGATIVE mg/dL
SPECIFIC GRAVITY, URINE: 1.015 (ref 1.005–1.030)
pH: 5 (ref 5.0–8.0)

## 2016-09-13 LAB — MAGNESIUM: Magnesium: 1.9 mg/dL (ref 1.7–2.4)

## 2016-09-13 MED ORDER — PALONOSETRON HCL INJECTION 0.25 MG/5ML
0.2500 mg | Freq: Once | INTRAVENOUS | Status: AC
  Administered 2016-09-13: 0.25 mg via INTRAVENOUS
  Filled 2016-09-13: qty 5

## 2016-09-13 MED ORDER — DEXAMETHASONE SODIUM PHOSPHATE 10 MG/ML IJ SOLN
10.0000 mg | Freq: Once | INTRAMUSCULAR | Status: AC
  Administered 2016-09-13: 10 mg via INTRAVENOUS
  Filled 2016-09-13: qty 1

## 2016-09-13 MED ORDER — HEPARIN SOD (PORK) LOCK FLUSH 100 UNIT/ML IV SOLN: 500.0000 [IU] | Freq: Once | INTRAVENOUS | Status: DC | PRN

## 2016-09-13 MED ORDER — ALTEPLASE 2 MG IJ SOLR: 2.0000 mg | Freq: Once | INTRAMUSCULAR | Status: DC | PRN

## 2016-09-13 MED ORDER — SODIUM CHLORIDE 0.9 % IV SOLN: 10.0000 mg | Freq: Once | INTRAVENOUS | Status: DC

## 2016-09-13 MED ORDER — SODIUM CHLORIDE 0.9% FLUSH
3.0000 mL | INTRAVENOUS | Status: DC | PRN
  Filled 2016-09-13: qty 3

## 2016-09-13 MED ORDER — DEXTROSE 5 % IV SOLN
800.0000 mg | Freq: Once | INTRAVENOUS | Status: AC
  Administered 2016-09-13: 800 mg via INTRAVENOUS
  Filled 2016-09-13: qty 35

## 2016-09-13 MED ORDER — HEPARIN SOD (PORK) LOCK FLUSH 100 UNIT/ML IV SOLN: 250.0000 [IU] | Freq: Once | INTRAVENOUS | Status: DC | PRN

## 2016-09-13 MED ORDER — SODIUM CHLORIDE 0.9% FLUSH
10.0000 mL | INTRAVENOUS | Status: DC | PRN
  Filled 2016-09-13: qty 10

## 2016-09-13 MED ORDER — SODIUM CHLORIDE 0.9 % IV SOLN
2400.0000 mg/m2 | INTRAVENOUS | Status: DC
  Administered 2016-09-13: 4850 mg via INTRAVENOUS
  Filled 2016-09-13: qty 97

## 2016-09-13 MED ORDER — MAGNESIUM SULFATE 50 % IJ SOLN
Freq: Once | INTRAVENOUS | Status: AC
  Administered 2016-09-13: 12:00:00 via INTRAVENOUS
  Filled 2016-09-13: qty 100

## 2016-09-13 MED ORDER — OXALIPLATIN CHEMO INJECTION 100 MG/20ML
85.0000 mg/m2 | Freq: Once | INTRAVENOUS | Status: AC
  Administered 2016-09-13: 170 mg via INTRAVENOUS
  Filled 2016-09-13: qty 34

## 2016-09-13 MED ORDER — DEXTROSE 5 % IV SOLN
Freq: Once | INTRAVENOUS | Status: AC
  Administered 2016-09-13: 12:00:00 via INTRAVENOUS
  Filled 2016-09-13: qty 1000

## 2016-09-13 NOTE — Progress Notes (Signed)
Patient here for f/u for scan results and chemotherapy treatment.

## 2016-09-13 NOTE — Progress Notes (Signed)
[facility] NOTE  [name], [name], [name], [name], [name]; [name]; [name]; [name]; [name]; [name]; [name]; [name]; July 14th  2017 CT- PR  # AUG 28th- FOLFIRI +Avastin; OCT 5th CT scan- PR.   # right kidney lesion 2.6 x 1.6 cm ? Angiolipoma [followed by Urology in past]     Malignant neoplasm of ascending colon ([place])   09/23/2015 Initial Diagnosis    Malignant neoplasm of ascending colon ([place])       HISTORY OF PRESENTING ILLNESS:  Kathleen James 66 y.o.  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   Denies any abdominal pain. He denies any constipation. Appetite is good. No pain. No swelling of the legs. Denies any tingling or numbness. No shortness of breath or chest pain. No cough.  ROS: A complete 10 point review of system is done which is negative except mentioned above in history of present illness  MEDICAL HISTORY:  Past Medical History:  Diagnosis Date  . Angiolipoma of kidney    followed with serial CTs ,,  Dr. Jacqlyn Larsen  . Arthritis   . Cancer ([place])    colon  . Chemotherapy induced diarrhea   . Hammer toe   . Hypertension   . Neuro-endocrine carcinoma ([place])   . Obesity (BMI 30-39.9)     SURGICAL HISTORY: Past Surgical History:  Procedure Laterality Date  . ABDOMINAL  HYSTERECTOMY  1995   endometriosis, heavy bleeding  . BUNIONECTOMY Bilateral   . CESAREAN SECTION  1985  . COLONOSCOPY WITH PROPOFOL N/A 10/09/2015   Procedure: COLONOSCOPY WITH PROPOFOL;  Surgeon: Robert Bellow, MD;  Location: [facility] ENDOSCOPY;  Service: Endoscopy;  Laterality: N/A;  . IR GENERIC HISTORICAL  04/07/2016   IR RADIOLOGIST EVAL & MGMT 04/07/2016 Arne Cleveland, MD [facility] INTERV RAD  . OOPHORECTOMY    . PORTACATH PLACEMENT Left 10/01/2015   Procedure: INSERTION PORT-A-CATH;  Surgeon: Robert Bellow, MD;  Location: [facility] ORS;  Service: General;  Laterality: Left;  . ROTATOR CUFF REPAIR Right 2008   right shoulder.  [place]     SOCIAL HISTORY: She lives at home with her family in [place]. 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 Social History   Social History  . Marital status: Married    Spouse name: N/A  . Number of children: N/A  . Years of education: N/A   Occupational History  . Not on file.   Social History Main Topics  . Smoking status: Never Smoker  . Smokeless tobacco: Never Used  . Alcohol use 0.0 oz/week     Comment: occasionally  . Drug use: No  . Sexual activity: Not Currently   Other Topics Concern  . Not on file   Social History Narrative  . No narrative on file  FAMILY HISTORY: Family History  Problem Relation Age of Onset  . Mental illness Mother 77    bipolar, dementia  . Cancer Maternal Aunt 70    breast ca  . Hypertension Father   . Stroke Father 63    deceased  . COPD Father   . Heart disease Sister     deceased    ALLERGIES:  is allergic to bee venom and morphine and related.  MEDICATIONS:  Current Outpatient Prescriptions  Medication Sig Dispense Refill  . ALPRAZolam (XANAX) 0.25 MG tablet Take 1 tablet (0.25 mg total) by mouth 2 (two) times daily as needed for anxiety or sleep. (Patient taking differently: Take 0.25 mg by mouth 2 (two) times daily as needed for anxiety or  sleep. Patient scores the tablet in half-) 60 tablet 3  . amLODipine (NORVASC) 5 MG tablet TAKE ONE TABLET BY MOUTH EVERY DAY 30 tablet 3  . lidocaine-prilocaine (EMLA) cream Apply 1 application topically as needed. Apply to port a cath site 1 hour prior to chemotherapy treatments 30 g 6  . loperamide (IMODIUM A-D) 2 MG tablet Take 2 mg by mouth 4 (four) times daily as needed for diarrhea or loose stools.    . Multiple Vitamins-Minerals (MULTIVITAMIN ADULT PO) Take 1 tablet by mouth daily.     Marland Kitchen olmesartan (BENICAR) 40 MG tablet Take 1 tablet (40 mg total) by mouth daily. 30 tablet 2  . potassium chloride (K-DUR) 10 MEQ tablet Take 1 tablet (10 mEq total) by mouth 2 (two) times daily. 60 tablet 3  . diphenoxylate-atropine (LOMOTIL) 2.5-0.025 MG tablet TAKE 1 TABLET FOUR TIMES DAILY AS NEEDEDFOR DIARRHEA OR LOOSE STOOLS (Patient not taking: Reported on 08/30/2016) 30 tablet 3  . ondansetron (ZOFRAN) 8 MG tablet Take 1 tablet (8 mg total) by mouth every 6 (six) hours as needed for nausea or vomiting. (Patient not taking: Reported on 08/09/2016) 30 tablet 3  . prochlorperazine (COMPAZINE) 10 MG tablet Take 1 tablet (10 mg total) by mouth every 6 (six) hours as needed for nausea or vomiting. (Patient not taking: Reported on 08/09/2016) 30 tablet 6   No current facility-administered medications for this visit.    Facility-Administered Medications Ordered in Other Visits  Medication Dose Route Frequency Provider Last Rate Last Dose  . heparin lock flush 100 unit/mL  500 Units Intravenous Once Kathleen Sickle, MD      . sodium chloride flush (NS) 0.9 % injection 10 mL  10 mL Intravenous PRN Kathleen Sickle, MD   10 mL at 11/24/15 0836      .  PHYSICAL EXAMINATION: ECOG PERFORMANCE STATUS: 0 - Asymptomatic  Vitals:   09/13/16 0951  BP: (!) 162/97   There were no vitals filed for this visit.  GENERAL: Well-nourished well-developed; Alert, no distress and comfortable. She is with  daughter.  EYES: no pallor or icterus OROPHARYNX: no thrush or ulceration; good dentition  NECK: supple, no masses felt LYMPH:  no palpable lymphadenopathy in the cervical, axillary or inguinal regions LUNGS: clear to auscultation and  No wheeze or crackles HEART/CVS: regular rate & rhythm and no murmurs; No lower extremity edema ABDOMEN: abdomen soft, non-tender and normal bowel sounds; positive for hepatomegaly. Musculoskeletal:no cyanosis of digits and no clubbing  PSYCH: alert & oriented x 3 with fluent speech NEURO: no focal motor/sensory deficits SKIN:  no rashes or significant lesions; Mediport is in place.   LABORATORY DATA:  I have reviewed the data as listed Lab Results  Component Value  Date   WBC 5.1 09/13/2016   HGB 12.1 09/13/2016   HCT 34.7 (L) 09/13/2016   MCV 98.8 09/13/2016   PLT 272 09/13/2016    Recent Labs  08/09/16 0918 08/30/16 0853 09/13/16 0935  NA 135 135 136  K 4.1 3.7 3.9  CL 106 107 108  CO2 23 22 24   GLUCOSE 92 78 79  BUN 19 15 20   CREATININE 0.97 0.92 1.01*  CALCIUM 9.0 9.0 8.8*  GFRNONAA 60* >60 57*  GFRAA >60 >60 >60  PROT 8.3* 8.0 7.5  ALBUMIN 3.9 3.8 3.5  AST 26 28 24   ALT 21 19 19   ALKPHOS 95 87 80  BILITOT 0.5 0.2* 0.4    RADIOGRAPHIC STUDIES: I have personally reviewed the radiological images as listed and agreed with the findings in the report. Ct Abdomen Pelvis W Contrast  Result Date: 09/07/2016 CLINICAL DATA:  Colon cancer with metastasis to liver. Ascending colonic primary. Rising CEA. EXAM: CT ABDOMEN AND PELVIS WITH CONTRAST TECHNIQUE: Multidetector CT imaging of the abdomen and pelvis was performed using the standard protocol following bolus administration of intravenous contrast. CONTRAST:  152mL ISOVUE-300 IOPAMIDOL (ISOVUE-300) INJECTION 61% COMPARISON:  07/15/2016 FINDINGS: Lower chest: Clear lung bases. Normal heart size without pericardial or pleural effusion. Hepatobiliary: Dominant subcapsular high right liver  lobe lesion measures 4.8 x 3.3 cm on image 13/series 2. Compare 4.6 x 3.1 cm on the prior. Immediately inferior and medial right liver lobe lesion measures 1.7 cm on image 16/series 2 and is unchanged. Inferior right hepatic lobe heterogeneously enhancing lesion measures 1.8 cm on image 38/series 2 versus 1.7 cm on the prior. Smaller liver lesions are not significantly changed. Concurrent hepatic cysts. No new dominant lesion. Normal gallbladder, without biliary ductal dilatation. Pancreas: Normal, without mass or ductal dilatation. Spleen: Normal in size, without focal abnormality. Adrenals/Urinary Tract: Normal adrenal glands. 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 Normal left kidney, without hydronephrosis. Normal urinary bladder. Stomach/Bowel: Normal stomach, without wall thickening. Soft tissue fullness surrounding the ileocecal junction, including within the cecum and ascending colon, appears increased. Example image 43 through 49/series 2. No complicating obstruction. Normal terminal ileum and appendix. Normal small bowel. Vascular/Lymphatic: Aortic and branch vessel atherosclerosis. Ileocolic mesenteric adenopathy. Central node measures 10 mm on image 40/series 2 versus 9 mm on the prior. A more peripheral nodal conglomerate measures 2.0 x 2.2 cm on image 43/series 2. Compare 1.9 x 1.4 cm on the prior exam (when remeasured). Adjacent more inferior pericolonic node measures 12 mm on image 45/series 2 versus similar on the prior exam (when remeasured). No pelvic sidewall adenopathy. Reproductive: Hysterectomy.  No adnexal mass. Other: No significant free fluid. No evidence of omental or peritoneal disease. Musculoskeletal: Mild osteopenia. Lumbosacral degenerative disc disease. IMPRESSION: 1. Similar to minimal increase in hepatic metastasis. 2. Mild increase in ileocolic mesenteric adenopathy. 3. Apparent increased soft tissue fullness within the cecum and ascending colon may represent  progression of the primary lesion. No evidence of complicating obstruction. 4.  Aortic atherosclerosis. Electronically Signed   By: Abigail Miyamoto M.D.   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  # Given the likely progression the primary site- recommend switching the chemotherapy to FOLFOX [discontinue Avastin;  see discussion below] every 2 weeks. Labs today reviewed;  acceptable for treatment today.   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  # Significant nausea vomiting with oxaliplatin chemotherapy- recommend adding Emend with subsequent cycles.  # Given the increasing size of the primary cecal mass- fortunately patient is asymptomatic at this time. Recommend discontinuation of Avastin. Discussed with Dr. Charissa Bash; patient will likely need surgery for the primary colonic lesion even before she gets symptomatic. Recommend Avastin 4-6 weeks off prior to surgery.   # Liver lesions- discussed the possible role of Y 90- prior to surgery to stabilize the liver lesions. Again patient would be needing to be off Avastin for about 4-6 weeks prior.  #  follow-up with me in 2 weeks/ chemo.   # I reviewed the blood work- with the patient in detail; also reviewed the imaging independently [as summarized above]; and with the patient in detail. Will also review at the tumor conference.     Kathleen Sickle, MD 09/14/2016 2:43 PM

## 2016-09-13 NOTE — Assessment & Plan Note (Addendum)
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  # Given the likely progression the primary site- recommend switching the chemotherapy to FOLFOX [discontinue Avastin; see discussion below] every 2 weeks. Labs today reviewed;  acceptable for treatment today.   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  # Significant nausea vomiting with oxaliplatin chemotherapy- recommend adding Emend with subsequent cycles.  # Given the increasing size of the primary cecal mass- fortunately patient is asymptomatic at this time. Recommend discontinuation of Avastin. Discussed with Dr. Charissa Bash; patient will likely need surgery for the primary colonic lesion even before she gets symptomatic. Recommend Avastin 4-6 weeks off prior to surgery.   # Liver lesions- discussed the possible role of Y 90- prior to surgery to stabilize the liver lesions. Again patient would be needing to be off Avastin for about 4-6 weeks prior.  #  follow-up with me in 2 weeks/ chemo.   # I reviewed the blood work- with the patient in detail; also reviewed the imaging independently [as summarized above]; and with the patient in detail. Will also review at the tumor conference.

## 2016-09-14 ENCOUNTER — Other Ambulatory Visit: Payer: Self-pay | Admitting: *Deleted

## 2016-09-14 ENCOUNTER — Encounter: Payer: Self-pay | Admitting: Internal Medicine

## 2016-09-14 DIAGNOSIS — C182 Malignant neoplasm of ascending colon: Secondary | ICD-10-CM

## 2016-09-14 LAB — CEA: CEA: 197.3 ng/mL — ABNORMAL HIGH (ref 0.0–4.7)

## 2016-09-15 ENCOUNTER — Inpatient Hospital Stay: Payer: Medicare HMO

## 2016-09-15 ENCOUNTER — Inpatient Hospital Stay: Payer: Medicare HMO | Admitting: Internal Medicine

## 2016-09-15 VITALS — BP 135/87 | HR 94 | Temp 97.7°F | Resp 18

## 2016-09-15 DIAGNOSIS — Z5111 Encounter for antineoplastic chemotherapy: Secondary | ICD-10-CM | POA: Diagnosis not present

## 2016-09-15 DIAGNOSIS — C189 Malignant neoplasm of colon, unspecified: Secondary | ICD-10-CM

## 2016-09-15 DIAGNOSIS — C799 Secondary malignant neoplasm of unspecified site: Secondary | ICD-10-CM

## 2016-09-15 DIAGNOSIS — C182 Malignant neoplasm of ascending colon: Secondary | ICD-10-CM

## 2016-09-15 MED ORDER — HEPARIN SOD (PORK) LOCK FLUSH 100 UNIT/ML IV SOLN
500.0000 [IU] | Freq: Once | INTRAVENOUS | Status: AC | PRN
  Administered 2016-09-15: 500 [IU]
  Filled 2016-09-15: qty 5

## 2016-09-15 MED ORDER — SODIUM CHLORIDE 0.9% FLUSH
10.0000 mL | INTRAVENOUS | Status: DC | PRN
  Administered 2016-09-15: 10 mL
  Filled 2016-09-15: qty 10

## 2016-09-16 ENCOUNTER — Inpatient Hospital Stay: Payer: Medicare HMO

## 2016-09-17 ENCOUNTER — Inpatient Hospital Stay: Payer: Medicare HMO

## 2016-09-20 ENCOUNTER — Ambulatory Visit: Payer: Self-pay | Admitting: Internal Medicine

## 2016-09-20 ENCOUNTER — Ambulatory Visit: Payer: Self-pay

## 2016-09-20 ENCOUNTER — Inpatient Hospital Stay: Payer: Medicare HMO | Attending: Internal Medicine

## 2016-09-20 ENCOUNTER — Other Ambulatory Visit: Payer: Self-pay

## 2016-09-20 ENCOUNTER — Inpatient Hospital Stay: Payer: Medicare HMO

## 2016-09-20 DIAGNOSIS — I10 Essential (primary) hypertension: Secondary | ICD-10-CM | POA: Diagnosis not present

## 2016-09-20 DIAGNOSIS — C7A8 Other malignant neuroendocrine tumors: Secondary | ICD-10-CM | POA: Diagnosis not present

## 2016-09-20 DIAGNOSIS — C7B8 Other secondary neuroendocrine tumors: Secondary | ICD-10-CM | POA: Diagnosis not present

## 2016-09-20 DIAGNOSIS — Z5111 Encounter for antineoplastic chemotherapy: Secondary | ICD-10-CM | POA: Diagnosis not present

## 2016-09-20 DIAGNOSIS — E669 Obesity, unspecified: Secondary | ICD-10-CM | POA: Insufficient documentation

## 2016-09-20 DIAGNOSIS — D1771 Benign lipomatous neoplasm of kidney: Secondary | ICD-10-CM | POA: Diagnosis not present

## 2016-09-20 DIAGNOSIS — R590 Localized enlarged lymph nodes: Secondary | ICD-10-CM | POA: Diagnosis not present

## 2016-09-20 DIAGNOSIS — R112 Nausea with vomiting, unspecified: Secondary | ICD-10-CM | POA: Diagnosis not present

## 2016-09-20 DIAGNOSIS — Z79899 Other long term (current) drug therapy: Secondary | ICD-10-CM | POA: Diagnosis not present

## 2016-09-20 DIAGNOSIS — I700 Atherosclerosis of aorta: Secondary | ICD-10-CM | POA: Insufficient documentation

## 2016-09-20 DIAGNOSIS — G629 Polyneuropathy, unspecified: Secondary | ICD-10-CM | POA: Insufficient documentation

## 2016-09-20 DIAGNOSIS — C182 Malignant neoplasm of ascending colon: Secondary | ICD-10-CM

## 2016-09-20 LAB — CBC WITH DIFFERENTIAL/PLATELET
Basophils Absolute: 0 10*3/uL (ref 0–0.1)
Basophils Relative: 1 %
EOS ABS: 0.2 10*3/uL (ref 0–0.7)
Eosinophils Relative: 6 %
HEMATOCRIT: 34.1 % — AB (ref 35.0–47.0)
HEMOGLOBIN: 11.8 g/dL — AB (ref 12.0–16.0)
LYMPHS ABS: 1.3 10*3/uL (ref 1.0–3.6)
Lymphocytes Relative: 43 %
MCH: 34.6 pg — AB (ref 26.0–34.0)
MCHC: 34.7 g/dL (ref 32.0–36.0)
MCV: 99.6 fL (ref 80.0–100.0)
MONOS PCT: 7 %
Monocytes Absolute: 0.2 10*3/uL (ref 0.2–0.9)
NEUTROS ABS: 1.3 10*3/uL — AB (ref 1.4–6.5)
NEUTROS PCT: 43 %
Platelets: 268 10*3/uL (ref 150–440)
RBC: 3.42 MIL/uL — AB (ref 3.80–5.20)
RDW: 14.3 % (ref 11.5–14.5)
WBC: 2.9 10*3/uL — AB (ref 3.6–11.0)

## 2016-09-20 LAB — BASIC METABOLIC PANEL
Anion gap: 6 (ref 5–15)
BUN: 19 mg/dL (ref 6–20)
CALCIUM: 9 mg/dL (ref 8.9–10.3)
CO2: 24 mmol/L (ref 22–32)
CREATININE: 0.96 mg/dL (ref 0.44–1.00)
Chloride: 107 mmol/L (ref 101–111)
GFR calc Af Amer: >60 mL/min (ref >60)
GFR calc non Af Amer: >60 mL/min (ref >60)
GLUCOSE: 87 mg/dL (ref 65–99)
Potassium: 4.5 mmol/L (ref 3.5–5.1)
Sodium: 137 mmol/L (ref 135–145)

## 2016-09-20 LAB — MAGNESIUM: Magnesium: 2.1 mg/dL (ref 1.7–2.4)

## 2016-09-21 ENCOUNTER — Ambulatory Visit: Payer: Self-pay

## 2016-09-21 ENCOUNTER — Other Ambulatory Visit: Payer: Self-pay

## 2016-09-27 ENCOUNTER — Inpatient Hospital Stay: Payer: Medicare HMO

## 2016-09-27 ENCOUNTER — Inpatient Hospital Stay (HOSPITAL_BASED_OUTPATIENT_CLINIC_OR_DEPARTMENT_OTHER): Payer: Medicare HMO | Admitting: Internal Medicine

## 2016-09-27 DIAGNOSIS — G629 Polyneuropathy, unspecified: Secondary | ICD-10-CM | POA: Diagnosis not present

## 2016-09-27 DIAGNOSIS — R590 Localized enlarged lymph nodes: Secondary | ICD-10-CM

## 2016-09-27 DIAGNOSIS — C189 Malignant neoplasm of colon, unspecified: Secondary | ICD-10-CM

## 2016-09-27 DIAGNOSIS — D1771 Benign lipomatous neoplasm of kidney: Secondary | ICD-10-CM

## 2016-09-27 DIAGNOSIS — R112 Nausea with vomiting, unspecified: Secondary | ICD-10-CM | POA: Diagnosis not present

## 2016-09-27 DIAGNOSIS — Z95828 Presence of other vascular implants and grafts: Secondary | ICD-10-CM

## 2016-09-27 DIAGNOSIS — C799 Secondary malignant neoplasm of unspecified site: Secondary | ICD-10-CM | POA: Diagnosis not present

## 2016-09-27 DIAGNOSIS — C182 Malignant neoplasm of ascending colon: Secondary | ICD-10-CM

## 2016-09-27 DIAGNOSIS — C7A8 Other malignant neuroendocrine tumors: Secondary | ICD-10-CM | POA: Diagnosis not present

## 2016-09-27 DIAGNOSIS — C7B8 Other secondary neuroendocrine tumors: Secondary | ICD-10-CM

## 2016-09-27 DIAGNOSIS — I700 Atherosclerosis of aorta: Secondary | ICD-10-CM | POA: Diagnosis not present

## 2016-09-27 DIAGNOSIS — I10 Essential (primary) hypertension: Secondary | ICD-10-CM

## 2016-09-27 DIAGNOSIS — Z7189 Other specified counseling: Secondary | ICD-10-CM | POA: Insufficient documentation

## 2016-09-27 DIAGNOSIS — Z5111 Encounter for antineoplastic chemotherapy: Secondary | ICD-10-CM

## 2016-09-27 DIAGNOSIS — E669 Obesity, unspecified: Secondary | ICD-10-CM

## 2016-09-27 DIAGNOSIS — Z79899 Other long term (current) drug therapy: Secondary | ICD-10-CM

## 2016-09-27 LAB — CBC WITH DIFFERENTIAL/PLATELET
BASOS ABS: 0 10*3/uL (ref 0–0.1)
Basophils Relative: 1 %
Eosinophils Absolute: 0.1 10*3/uL (ref 0–0.7)
Eosinophils Relative: 4 %
HEMATOCRIT: 33.8 % — AB (ref 35.0–47.0)
HEMOGLOBIN: 12 g/dL (ref 12.0–16.0)
LYMPHS ABS: 1.4 10*3/uL (ref 1.0–3.6)
LYMPHS PCT: 36 %
MCH: 34.8 pg — AB (ref 26.0–34.0)
MCHC: 35.4 g/dL (ref 32.0–36.0)
MCV: 98.4 fL (ref 80.0–100.0)
Monocytes Absolute: 0.4 10*3/uL (ref 0.2–0.9)
Monocytes Relative: 11 %
NEUTROS ABS: 1.9 10*3/uL (ref 1.4–6.5)
Neutrophils Relative %: 48 %
Platelets: 260 10*3/uL (ref 150–440)
RBC: 3.44 MIL/uL — AB (ref 3.80–5.20)
RDW: 14.7 % — ABNORMAL HIGH (ref 11.5–14.5)
WBC: 3.9 10*3/uL (ref 3.6–11.0)

## 2016-09-27 LAB — URINALYSIS, COMPLETE (UACMP) WITH MICROSCOPIC
BACTERIA UA: NONE SEEN
Bilirubin Urine: NEGATIVE
GLUCOSE, UA: NEGATIVE mg/dL
Ketones, ur: NEGATIVE mg/dL
Leukocytes, UA: NEGATIVE
NITRITE: NEGATIVE
PROTEIN: NEGATIVE mg/dL
SPECIFIC GRAVITY, URINE: 1.005 (ref 1.005–1.030)
pH: 6 (ref 5.0–8.0)

## 2016-09-27 LAB — COMPREHENSIVE METABOLIC PANEL
ALK PHOS: 79 U/L (ref 38–126)
ALT: 23 U/L (ref 14–54)
AST: 30 U/L (ref 15–41)
Albumin: 3.8 g/dL (ref 3.5–5.0)
Anion gap: 5 (ref 5–15)
BILIRUBIN TOTAL: 0.4 mg/dL (ref 0.3–1.2)
BUN: 15 mg/dL (ref 6–20)
CALCIUM: 9 mg/dL (ref 8.9–10.3)
CHLORIDE: 107 mmol/L (ref 101–111)
CO2: 25 mmol/L (ref 22–32)
CREATININE: 0.89 mg/dL (ref 0.44–1.00)
GFR calc Af Amer: >60 mL/min (ref >60)
Glucose, Bld: 100 mg/dL — ABNORMAL HIGH (ref 65–99)
Potassium: 3.7 mmol/L (ref 3.5–5.1)
Sodium: 137 mmol/L (ref 135–145)
TOTAL PROTEIN: 7.6 g/dL (ref 6.5–8.1)

## 2016-09-27 LAB — MAGNESIUM: MAGNESIUM: 1.8 mg/dL (ref 1.7–2.4)

## 2016-09-27 MED ORDER — SODIUM CHLORIDE 0.9 % IV SOLN: 10.0000 mg | Freq: Once | INTRAVENOUS | Status: DC

## 2016-09-27 MED ORDER — SODIUM CHLORIDE 0.9% FLUSH
10.0000 mL | Freq: Once | INTRAVENOUS | Status: AC
  Administered 2016-09-27: 10 mL via INTRAVENOUS
  Filled 2016-09-27: qty 10

## 2016-09-27 MED ORDER — SODIUM CHLORIDE 0.9 % IV SOLN
2400.0000 mg/m2 | INTRAVENOUS | Status: DC
  Administered 2016-09-27: 4850 mg via INTRAVENOUS
  Filled 2016-09-27: qty 97

## 2016-09-27 MED ORDER — LEUCOVORIN CALCIUM INJECTION 350 MG
800.0000 mg | Freq: Once | INTRAMUSCULAR | Status: AC
  Administered 2016-09-27: 800 mg via INTRAVENOUS
  Filled 2016-09-27: qty 25

## 2016-09-27 MED ORDER — DEXTROSE 5 % IV SOLN
85.0000 mg/m2 | Freq: Once | INTRAVENOUS | Status: AC
  Administered 2016-09-27: 170 mg via INTRAVENOUS
  Filled 2016-09-27: qty 34

## 2016-09-27 MED ORDER — DEXTROSE 5 % IV SOLN
Freq: Once | INTRAVENOUS | Status: AC
  Administered 2016-09-27: 11:00:00 via INTRAVENOUS
  Filled 2016-09-27: qty 1000

## 2016-09-27 MED ORDER — DEXAMETHASONE SODIUM PHOSPHATE 10 MG/ML IJ SOLN: 10.0000 mg | Freq: Once | INTRAMUSCULAR | Status: DC

## 2016-09-27 MED ORDER — PALONOSETRON HCL INJECTION 0.25 MG/5ML
0.2500 mg | Freq: Once | INTRAVENOUS | Status: AC
  Administered 2016-09-27: 0.25 mg via INTRAVENOUS
  Filled 2016-09-27: qty 5

## 2016-09-27 MED ORDER — SODIUM CHLORIDE 0.9 % IV SOLN
Freq: Once | INTRAVENOUS | Status: AC
  Administered 2016-09-27: 12:00:00 via INTRAVENOUS
  Filled 2016-09-27: qty 100

## 2016-09-27 MED ORDER — HEPARIN SOD (PORK) LOCK FLUSH 100 UNIT/ML IV SOLN: 500.0000 [IU] | Freq: Once | INTRAVENOUS | Status: DC

## 2016-09-27 MED ORDER — FOSAPREPITANT DIMEGLUMINE INJECTION 150 MG
Freq: Once | INTRAVENOUS | Status: AC
  Administered 2016-09-27: 11:00:00 via INTRAVENOUS
  Filled 2016-09-27: qty 5

## 2016-09-27 NOTE — Progress Notes (Signed)
[place] NOTE  [name], [name], [name], [name], RN as Registered Nurse [name], MD as Consulting Physician (Internal Medicine)  CHIEF COMPLAINTS/PURPOSE OF CONSULTATION:   Oncology History   # MARCH/APRIL 2017-NEUROENDOCRINE CA STAGE IV; [name]; [name]; [name]; [name]; [name]; [name]; [name]; July 14th  2017 CT- PR  # AUG 28th- FOLFIRI +Avastin; OCT 5th CT scan- PR.   # MARCH 20th- CT STABLE liver lesions; ? Enlarging cecal lesion [CEA rising]; March 26th- FOLFOX [last avastin march 12th 2018]  # right kidney lesion 2.6 x 1.6 cm ? Angiolipoma [followed by Urology in past]     Malignant neoplasm of ascending colon ([place])   09/23/2015 Initial Diagnosis    Malignant neoplasm of ascending colon ([place])       HISTORY OF PRESENTING ILLNESS:  Kathleen James 66 y.o.  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  Patient complains of mild tingling and numbness in the extremities post chemotherapy. Also complains of mild jaw pain for the first to 3 days postchemotherapy. Mild nausea postchemotherapy currently resolved.  Denies any abdominal pain. He denies any constipation. Appetite is good. No pain. No swelling of the legs. Denies any tingling or numbness. No shortness of breath or chest pain. No cough.  ROS: A complete 10 point review of system is done which is negative except mentioned above in history of present illness  MEDICAL HISTORY:  Past Medical History:  Diagnosis Date  . Angiolipoma  of kidney    followed with serial CTs ,,  Dr. Jacqlyn Larsen  . Arthritis   . Cancer ([place])    colon  . Chemotherapy induced diarrhea   . Hammer toe   . Hypertension   . Neuro-endocrine carcinoma ([place])   . Obesity (BMI 30-39.9)     SURGICAL HISTORY: Past Surgical History:  Procedure Laterality Date  . ABDOMINAL HYSTERECTOMY  1995   endometriosis, heavy bleeding  . BUNIONECTOMY Bilateral   . CESAREAN SECTION  1985  . COLONOSCOPY WITH PROPOFOL N/A 10/09/2015   Procedure: COLONOSCOPY WITH PROPOFOL;  Surgeon: Robert Bellow, MD;  Location: [facility] ENDOSCOPY;  Service: Endoscopy;  Laterality: N/A;  . IR GENERIC HISTORICAL  04/07/2016   IR RADIOLOGIST EVAL & MGMT 04/07/2016 Arne Cleveland, MD [facility] INTERV RAD  . OOPHORECTOMY    . PORTACATH PLACEMENT Left 10/01/2015   Procedure: INSERTION PORT-A-CATH;  Surgeon: Robert Bellow, MD;  Location: [facility] ORS;  Service: General;  Laterality: Left;  . ROTATOR CUFF REPAIR Right 2008   right shoulder.  [place]     SOCIAL HISTORY: She lives at home with her family in [place]. 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 Social History   Social History  . Marital status: Married    Spouse name: N/A  . Number of children: N/A  . Years of education: N/A   Occupational History  . Not on file.   Social History Main Topics  . Smoking status: Never Smoker  . Smokeless tobacco: Never Used  . Alcohol  use 0.0 oz/week     Comment: occasionally  . Drug use: No  . Sexual activity: Not Currently   Other Topics Concern  . Not on file   Social History Narrative  . No narrative on file    FAMILY HISTORY: Family History  Problem Relation Age of Onset  . Mental illness Mother 62    bipolar, dementia  . Cancer Maternal Aunt 70    breast ca  . Hypertension Father   . Stroke Father 42    deceased  . COPD Father   . Heart disease Sister     deceased    ALLERGIES:  is allergic to bee venom and morphine and  related.  MEDICATIONS:  Current Outpatient Prescriptions  Medication Sig Dispense Refill  . ALPRAZolam (XANAX) 0.25 MG tablet Take 1 tablet (0.25 mg total) by mouth 2 (two) times daily as needed for anxiety or sleep. (Patient taking differently: Take 0.25 mg by mouth 2 (two) times daily as needed for anxiety or sleep. Patient scores the tablet in half-) 60 tablet 3  . amLODipine (NORVASC) 5 MG tablet TAKE ONE TABLET BY MOUTH EVERY DAY 30 tablet 3  . diphenoxylate-atropine (LOMOTIL) 2.5-0.025 MG tablet TAKE 1 TABLET FOUR TIMES DAILY AS NEEDEDFOR DIARRHEA OR LOOSE STOOLS 30 tablet 3  . lidocaine-prilocaine (EMLA) cream Apply 1 application topically as needed. Apply to port a cath site 1 hour prior to chemotherapy treatments 30 g 6  . loperamide (IMODIUM A-D) 2 MG tablet Take 2 mg by mouth 4 (four) times daily as needed for diarrhea or loose stools.    . Multiple Vitamins-Minerals (MULTIVITAMIN ADULT PO) Take 1 tablet by mouth daily.     Marland Kitchen olmesartan (BENICAR) 40 MG tablet Take 1 tablet (40 mg total) by mouth daily. 30 tablet 2  . potassium chloride (K-DUR) 10 MEQ tablet Take 1 tablet (10 mEq total) by mouth 2 (two) times daily. 60 tablet 3  . ondansetron (ZOFRAN) 8 MG tablet Take 1 tablet (8 mg total) by mouth every 6 (six) hours as needed for nausea or vomiting. (Patient not taking: Reported on 08/09/2016) 30 tablet 3  . prochlorperazine (COMPAZINE) 10 MG tablet Take 1 tablet (10 mg total) by mouth every 6 (six) hours as needed for nausea or vomiting. (Patient not taking: Reported on 08/09/2016) 30 tablet 6   No current facility-administered medications for this visit.    Facility-Administered Medications Ordered in Other Visits  Medication Dose Route Frequency Provider Last Rate Last Dose  . fluorouracil (ADRUCIL) 4,850 mg in sodium chloride 0.9 % 53 mL chemo infusion  2,400 mg/m2 (Treatment Plan Recorded) Intravenous 1 day or 1 dose [name], MD      . heparin lock flush 100 unit/mL   500 Units Intravenous Once [name], MD      . heparin lock flush 100 unit/mL  500 Units Intravenous Once [name], MD      . leucovorin 800 mg in dextrose 5 % 250 mL infusion  800 mg Intravenous Once [name], MD      . oxaliplatin (ELOXATIN) 170 mg in dextrose 5 % 500 mL chemo infusion  85 mg/m2 (Treatment Plan Recorded) Intravenous Once [name], MD      . sodium chloride flush (NS) 0.9 % injection 10 mL  10 mL Intravenous PRN [name], MD   10 mL at 11/24/15 0836      .  PHYSICAL EXAMINATION: ECOG PERFORMANCE STATUS: 0 -  Asymptomatic  Vitals:   09/27/16 1000 09/27/16 1004  BP: (!) 165/100 (!) 147/94  Pulse: 94 89  Resp: 20   Temp: 97.6 F (36.4 C)    Filed Weights   09/27/16 1000  Weight: 202 lb (91.6 kg)    GENERAL: Well-nourished well-developed; Alert, no distress and comfortable. She is with daughter.  EYES: no pallor or icterus OROPHARYNX: no thrush or ulceration; good dentition  NECK: supple, no masses felt LYMPH:  no palpable lymphadenopathy in the cervical, axillary or inguinal regions LUNGS: clear to auscultation and  No wheeze or crackles HEART/CVS: regular rate & rhythm and no murmurs; No lower extremity edema ABDOMEN: abdomen soft, non-tender and normal bowel sounds; positive for hepatomegaly. Musculoskeletal:no cyanosis of digits and no clubbing  PSYCH: alert & oriented x 3 with fluent speech NEURO: no focal motor/sensory deficits SKIN:  no rashes or significant lesions; Mediport is in place.   LABORATORY DATA:  I have reviewed the data as listed Lab Results  Component Value Date   WBC 3.9 09/27/2016   HGB 12.0 09/27/2016   HCT 33.8 (L) 09/27/2016   MCV 98.4 09/27/2016   PLT 260 09/27/2016    Recent Labs  08/30/16 0853 09/13/16 0935 09/20/16 0846 09/27/16 0935  NA 135 136 137 137  K 3.7 3.9 4.5 3.7  CL 107 108 107 107  CO2 22 24 24 25   GLUCOSE 78 79 87 100*  BUN 15 20 19 15    CREATININE 0.92 1.01* 0.96 0.89  CALCIUM 9.0 8.8* 9.0 9.0  GFRNONAA >60 57* >60 >60  GFRAA >60 >60 >60 >60  PROT 8.0 7.5  --  7.6  ALBUMIN 3.8 3.5  --  3.8  AST 28 24  --  30  ALT 19 19  --  23  ALKPHOS 87 80  --  79  BILITOT 0.2* 0.4  --  0.4    RADIOGRAPHIC STUDIES: I have personally reviewed the radiological images as listed and agreed with the findings in the report. Ct Abdomen Pelvis W Contrast  Result Date: 09/07/2016 CLINICAL DATA:  Colon cancer with metastasis to liver. Ascending colonic primary. Rising CEA. EXAM: CT ABDOMEN AND PELVIS WITH CONTRAST TECHNIQUE: Multidetector CT imaging of the abdomen and pelvis was performed using the standard protocol following bolus administration of intravenous contrast. CONTRAST:  176mL ISOVUE-300 IOPAMIDOL (ISOVUE-300) INJECTION 61% COMPARISON:  07/15/2016 FINDINGS: Lower chest: Clear lung bases. Normal heart size without pericardial or pleural effusion. Hepatobiliary: Dominant subcapsular high right liver lobe lesion measures 4.8 x 3.3 cm on image 13/series 2. Compare 4.6 x 3.1 cm on the prior. Immediately inferior and medial right liver lobe lesion measures 1.7 cm on image 16/series 2 and is unchanged. Inferior right hepatic lobe heterogeneously enhancing lesion measures 1.8 cm on image 38/series 2 versus 1.7 cm on the prior. Smaller liver lesions are not significantly changed. Concurrent hepatic cysts. No new dominant lesion. Normal gallbladder, without biliary ductal dilatation. Pancreas: Normal, without mass or ductal dilatation. Spleen: Normal in size, without focal abnormality. Adrenals/Urinary Tract: Normal adrenal glands. 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 Normal left kidney, without hydronephrosis. Normal urinary bladder. Stomach/Bowel: Normal stomach, without wall thickening. Soft tissue fullness surrounding the ileocecal junction, including within the cecum and ascending colon, appears increased. Example image 43  through 49/series 2. No complicating obstruction. Normal terminal ileum and appendix. Normal small bowel. Vascular/Lymphatic: Aortic and branch vessel atherosclerosis. Ileocolic mesenteric adenopathy. Central node measures 10 mm on image 40/series 2 versus 9  mm on the prior. A more peripheral nodal conglomerate measures 2.0 x 2.2 cm on image 43/series 2. Compare 1.9 x 1.4 cm on the prior exam (when remeasured). Adjacent more inferior pericolonic node measures 12 mm on image 45/series 2 versus similar on the prior exam (when remeasured). No pelvic sidewall adenopathy. Reproductive: Hysterectomy.  No adnexal mass. Other: No significant free fluid. No evidence of omental or peritoneal disease. Musculoskeletal: Mild osteopenia. Lumbosacral degenerative disc disease. IMPRESSION: 1. Similar to minimal increase in hepatic metastasis. 2. Mild increase in ileocolic mesenteric adenopathy. 3. Apparent increased soft tissue fullness within the cecum and ascending colon may represent progression of the primary lesion. No evidence of complicating obstruction. 4.  Aortic atherosclerosis. Electronically Signed   By: Abigail Miyamoto M.D.   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   # Proceed with FOLFOX No. 2 today. [ continue to Hold Avastin. Last Avastin March 12th]. Labs today reviewed;  acceptable for treatment today.   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  # Significant nausea vomiting with oxaliplatin chemotherapy- add Emend starting today.   # Given the increasing size of the primary cecal mass- fortunately patient is asymptomatic at this time.  appt with Dr.Byrnett this week. Plan to hold off y 63 to liver lesions at  this time.  #  follow-up with me in 2 weeks/ chemo; 1week/labs.     [name], MD 09/27/2016 12:55 PM

## 2016-09-27 NOTE — Assessment & Plan Note (Addendum)
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   # Proceed with FOLFOX No. 2 today. [ continue to Hold Avastin. Last Avastin March 12th]. Labs today reviewed;  acceptable for treatment today.   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  # Significant nausea vomiting with oxaliplatin chemotherapy- add Emend starting today.   # Given the increasing size of the primary cecal mass- fortunately patient is asymptomatic at this time.  appt with Dr.Byrnett this week. Plan to hold off y 55 to liver lesions at this time.  #  follow-up with me in 2 weeks/ chemo; 1week/labs.

## 2016-09-28 LAB — CEA: CEA: 215.3 ng/mL — AB (ref 0.0–4.7)

## 2016-09-29 ENCOUNTER — Inpatient Hospital Stay: Payer: Medicare HMO

## 2016-09-29 ENCOUNTER — Ambulatory Visit (INDEPENDENT_AMBULATORY_CARE_PROVIDER_SITE_OTHER): Payer: Medicare HMO | Admitting: General Surgery

## 2016-09-29 ENCOUNTER — Encounter: Payer: Self-pay | Admitting: Internal Medicine

## 2016-09-29 VITALS — BP 144/82 | HR 88 | Resp 14 | Ht 64.0 in | Wt 205.0 lb

## 2016-09-29 VITALS — BP 160/98 | HR 76 | Temp 97.7°F | Resp 20

## 2016-09-29 DIAGNOSIS — C182 Malignant neoplasm of ascending colon: Secondary | ICD-10-CM

## 2016-09-29 DIAGNOSIS — Z5111 Encounter for antineoplastic chemotherapy: Secondary | ICD-10-CM

## 2016-09-29 DIAGNOSIS — C189 Malignant neoplasm of colon, unspecified: Secondary | ICD-10-CM

## 2016-09-29 DIAGNOSIS — C799 Secondary malignant neoplasm of unspecified site: Secondary | ICD-10-CM

## 2016-09-29 MED ORDER — HEPARIN SOD (PORK) LOCK FLUSH 100 UNIT/ML IV SOLN
500.0000 [IU] | Freq: Once | INTRAVENOUS | Status: AC | PRN
  Administered 2016-09-29: 500 [IU]
  Filled 2016-09-29: qty 5

## 2016-09-29 MED ORDER — POLYETHYLENE GLYCOL 3350 17 GM/SCOOP PO POWD: ORAL | 0 refills | Status: DC

## 2016-09-29 MED ORDER — METRONIDAZOLE 500 MG PO TABS: ORAL_TABLET | ORAL | 0 refills | Status: DC

## 2016-09-29 MED ORDER — NEOMYCIN SULFATE 500 MG PO TABS: ORAL_TABLET | ORAL | 0 refills | Status: DC

## 2016-09-29 MED ORDER — SODIUM CHLORIDE 0.9% FLUSH
10.0000 mL | INTRAVENOUS | Status: DC | PRN
  Administered 2016-09-29: 10 mL
  Filled 2016-09-29: qty 10

## 2016-09-29 NOTE — Progress Notes (Signed)
Patient ID: Kathleen James, female   DOB: 1951/03/12, 66 y.o.   MRN: [id number]  Chief Complaint  Patient presents with  . Colon Cancer    HPI Kathleen James is a 66 y.o. female here today to discuss colon cancer. Last coonoscopy done on 10/09/2015 and and Ct scan on 09/07/2016.  Moves bowels daily. Daughter, Kathleen James present at visit.The patient has been receiving treatment for a metastatic neuroendocrine tumor originating in the cecum since her April 2017 evaluation which included colonoscopy and port placement. She has done well with Avastin, but recently has been noted to trend up on her CEA values and her most recent CT suggested progression of disease.  She has had no GI symptoms and tolerated her therapy well. No obstructive symptoms. Weights been fairly stable. Concern is for the possibility of obstruction of the ileocecal area with tumor progression while on angiogenesis inhibiting therapy which would make surgery more risky.  The patient's daughter is getting married in 2 weeks and any intervention would be postponed until after that event.  HPI  Past Medical History:  Diagnosis Date  . Angiolipoma of kidney    followed with serial CTs ,,  Dr. Jacqlyn Larsen  . Arthritis   . Cancer ([place])    colon  . Chemotherapy induced diarrhea   . Hammer toe   . Hypertension   . Neuro-endocrine carcinoma ([place])   . Obesity (BMI 30-39.9)     Past Surgical History:  Procedure Laterality Date  . ABDOMINAL HYSTERECTOMY  1995   endometriosis, heavy bleeding  . BUNIONECTOMY Bilateral   . CESAREAN SECTION  1985  . COLONOSCOPY WITH PROPOFOL N/A 10/09/2015   Procedure: COLONOSCOPY WITH PROPOFOL;  Surgeon: Robert Bellow, MD;  Location: [facility] ENDOSCOPY;  Service: Endoscopy;  Laterality: N/A;  . IR GENERIC HISTORICAL  04/07/2016   IR RADIOLOGIST EVAL & MGMT 04/07/2016 Arne Cleveland, MD [facility] INTERV RAD  . OOPHORECTOMY    . PORTACATH PLACEMENT Left 10/01/2015   Procedure: INSERTION PORT-A-CATH;   Surgeon: Robert Bellow, MD;  Location: [facility] ORS;  Service: General;  Laterality: Left;  . ROTATOR CUFF REPAIR Right 2008   right shoulder.  [place]     Family History  Problem Relation Age of Onset  . Mental illness Mother 30    bipolar, dementia  . Cancer Maternal Aunt 70    breast ca  . Hypertension Father   . Stroke Father 24    deceased  . COPD Father   . Heart disease Sister     deceased    Social History Social History  Substance Use Topics  . Smoking status: Never Smoker  . Smokeless tobacco: Never Used  . Alcohol use 0.0 oz/week     Comment: occasionally    Allergies  Allergen Reactions  . Bee Venom   . Morphine And Related Nausea Only    Current Outpatient Prescriptions  Medication Sig Dispense Refill  . ALPRAZolam (XANAX) 0.25 MG tablet Take 1 tablet (0.25 mg total) by mouth 2 (two) times daily as needed for anxiety or sleep. (Patient taking differently: Take 0.25 mg by mouth 2 (two) times daily as needed for anxiety or sleep. Patient scores the tablet in half-) 60 tablet 3  . amLODipine (NORVASC) 5 MG tablet TAKE ONE TABLET BY MOUTH EVERY DAY 30 tablet 3  . diphenoxylate-atropine (LOMOTIL) 2.5-0.025 MG tablet TAKE 1 TABLET FOUR TIMES DAILY AS NEEDEDFOR DIARRHEA OR LOOSE STOOLS 30 tablet 3  . lidocaine-prilocaine (EMLA) cream Apply 1  application topically as needed. Apply to port a cath site 1 hour prior to chemotherapy treatments 30 g 6  . loperamide (IMODIUM A-D) 2 MG tablet Take 2 mg by mouth 4 (four) times daily as needed for diarrhea or loose stools.    . metroNIDAZOLE (FLAGYL) 500 MG tablet Take one (1) tablet at 6 pm and one (1) tablet at 11 pm the evening prior to surgery. 2 tablet 0  . Multiple Vitamins-Minerals (MULTIVITAMIN ADULT PO) Take 1 tablet by mouth daily.     Marland Kitchen neomycin (MYCIFRADIN) 500 MG tablet Take two (2) tablets at 6 pm and two (2) tablets at 11 pm the evening prior to surgery. 4 tablet 0  . olmesartan (BENICAR) 40 MG tablet Take 1  tablet (40 mg total) by mouth daily. 30 tablet 2  . ondansetron (ZOFRAN) 8 MG tablet Take 1 tablet (8 mg total) by mouth every 6 (six) hours as needed for nausea or vomiting. (Patient not taking: Reported on 08/09/2016) 30 tablet 3  . polyethylene glycol powder (GLYCOLAX/MIRALAX) powder 255 grams one bottle for colonoscopy prep 255 g 0  . potassium chloride (K-DUR) 10 MEQ tablet Take 1 tablet (10 mEq total) by mouth 2 (two) times daily. 60 tablet 3  . prochlorperazine (COMPAZINE) 10 MG tablet Take 1 tablet (10 mg total) by mouth every 6 (six) hours as needed for nausea or vomiting. (Patient not taking: Reported on 08/09/2016) 30 tablet 6   No current facility-administered medications for this visit.    Facility-Administered Medications Ordered in Other Visits  Medication Dose Route Frequency Provider Last Rate Last Dose  . heparin lock flush 100 unit/mL  500 Units Intravenous Once [name], MD      . sodium chloride flush (NS) 0.9 % injection 10 mL  10 mL Intravenous PRN [name], MD   10 mL at 11/24/15 2563    Review of Systems Review of Systems  Constitutional: Negative.   Cardiovascular: Negative.     Blood pressure (!) 144/82, pulse 88, resp. rate 14, height '5\' 4"'  (1.626 m), weight 205 lb (93 kg).  Physical Exam Physical Exam  Constitutional: She is oriented to person, place, and time. She appears well-developed and well-nourished.  Eyes: Conjunctivae are normal. No scleral icterus.  Neck: Neck supple.  Cardiovascular: Normal rate, regular rhythm and normal heart sounds.   Pulmonary/Chest: Effort normal and breath sounds normal.  Abdominal: Soft. Normal appearance and bowel sounds are normal. There is no hepatomegaly. There is no tenderness.  Lymphadenopathy:    She has no cervical adenopathy.  Neurological: She is alert and oriented to person, place, and time.  Skin: Skin is warm and dry.    Data Reviewed 04/07/2016 consul note from Everrett Coombe, M.D.  regarding intra-arterial Y-90 radial embolization therapy for her tumor.  Chemotherapy notes from Dr. Rogue Bussing were reviewed.  CT of the abdomen and pelvis dated 09/07/2016 reviewed. Soft tissue fullness in the cecum/ascending colon appears more pronounced. No evidence of small bowel obstruction. Possible progression of primary lesion.  Similar to mild increase in size of hepatic metastases.  09/23/2015 CEA: 538. Hemoglobin at that time 12.3 with an MCV of 92.  09/26/2015 liver biopsy:DIAGNOSIS:  A. LIVER MASS, RIGHT; ULTRASOUND GUIDED BIOPSY:  - METASTATIC NEUROENDOCRINE CARCINOMA.  - UP TO 17 MITOSIS PER HIGH POWER FIELD.  - 60% KI-67 LABELLING.  10/09/2015 colonoscopy biopsy of the cecal mass: DIAGNOSIS:  A. COLON MASS, ASCENDING; HOT SNARE AND COLD BIOPSY:  - NEUROENDOCRINE CARCINOMA, NON-SMALL  CELL TYPE.    09/13/2016 CEA: 197, up 100 points from January 2018.  Comprehensive metabolic panel and CBC of 09/13/2016 showed a creatinine of 1.0 with an estimated GFR 57, normal electrolytes, albumin 3.5. Hemoglobin 12.1 with an MCV of 99. White blood cell count of 5100 with normal differential. Platelet count 272,000.  Chemotherapy infusion notes show the last Avastin therapy 08/30/2016.  Assessment    Progressive disease in the ascending colon near the ileocecal valve, concern for possible late obstruction.  Desire to resume Avastin therapy with lower risk of need for acute surgical intervention showed obstruction occur.    Plan    Indications for colectomy were reviewed with the patient and her daughter (cardiothoracic nurse in [place]). Risks of the procedure reviewed in detail. Low likelihood of stoma.  The patient is be interested in pursuing radial embolization while she is recovering from surgery. This request will be referred to Dr. Rogue Bussing.      HPI, Physical Exam, Assessment and Plan have been scribed under the direction and in the presence of Hervey Ard,  MD.  Gaspar Cola, CMA  I have completed the exam and reviewed the above documentation for accuracy and completeness.  I agree with the above.  Haematologist has been used and any errors in dictation or transcription are unintentional.  Hervey Ard, M.D., F.A.C.S.   Robert Bellow 09/30/2016, 6:44 AM   Patient's surgery has been scheduled for 10-26-16 at [facility]. This is to accommodate patient's request to wait till after 10-20-16 due to her daughter's upcoming wedding. Patient has been asked to complete a bowel prep with antibiotics. Prescriptions have been sent in to the patient's pharmacy today.  Dominga Ferry, CMA

## 2016-10-05 ENCOUNTER — Inpatient Hospital Stay: Payer: Medicare HMO

## 2016-10-05 DIAGNOSIS — Z5111 Encounter for antineoplastic chemotherapy: Secondary | ICD-10-CM | POA: Diagnosis not present

## 2016-10-05 DIAGNOSIS — Z7189 Other specified counseling: Secondary | ICD-10-CM

## 2016-10-05 DIAGNOSIS — C182 Malignant neoplasm of ascending colon: Secondary | ICD-10-CM

## 2016-10-05 LAB — BASIC METABOLIC PANEL
ANION GAP: 4 — AB (ref 5–15)
BUN: 16 mg/dL (ref 6–20)
CO2: 28 mmol/L (ref 22–32)
Calcium: 9.3 mg/dL (ref 8.9–10.3)
Chloride: 103 mmol/L (ref 101–111)
Creatinine, Ser: 0.98 mg/dL (ref 0.44–1.00)
GFR calc Af Amer: >60 mL/min (ref >60)
GFR, EST NON AFRICAN AMERICAN: 59 mL/min — AB (ref >60)
GLUCOSE: 100 mg/dL — AB (ref 65–99)
POTASSIUM: 4.2 mmol/L (ref 3.5–5.1)
SODIUM: 135 mmol/L (ref 135–145)

## 2016-10-05 LAB — CBC WITH DIFFERENTIAL/PLATELET
BASOS ABS: 0 10*3/uL (ref 0–0.1)
Basophils Relative: 1 %
EOS ABS: 0.1 10*3/uL (ref 0–0.7)
EOS PCT: 3 %
HCT: 35.3 % (ref 35.0–47.0)
HEMOGLOBIN: 12.3 g/dL (ref 12.0–16.0)
LYMPHS PCT: 34 %
Lymphs Abs: 1.5 10*3/uL (ref 1.0–3.6)
MCH: 34.2 pg — ABNORMAL HIGH (ref 26.0–34.0)
MCHC: 34.8 g/dL (ref 32.0–36.0)
MCV: 98.3 fL (ref 80.0–100.0)
Monocytes Absolute: 0.3 10*3/uL (ref 0.2–0.9)
Monocytes Relative: 6 %
NEUTROS PCT: 56 %
Neutro Abs: 2.5 10*3/uL (ref 1.4–6.5)
PLATELETS: 268 10*3/uL (ref 150–440)
RBC: 3.59 MIL/uL — AB (ref 3.80–5.20)
RDW: 14.7 % — ABNORMAL HIGH (ref 11.5–14.5)
WBC: 4.5 10*3/uL (ref 3.6–11.0)

## 2016-10-11 ENCOUNTER — Inpatient Hospital Stay (HOSPITAL_BASED_OUTPATIENT_CLINIC_OR_DEPARTMENT_OTHER): Payer: Medicare HMO | Admitting: Internal Medicine

## 2016-10-11 ENCOUNTER — Inpatient Hospital Stay: Payer: Medicare HMO

## 2016-10-11 VITALS — BP 146/94 | HR 92 | Temp 97.8°F | Resp 18 | Ht 64.0 in | Wt 201.2 lb

## 2016-10-11 VITALS — BP 134/87 | HR 89

## 2016-10-11 DIAGNOSIS — C7A8 Other malignant neuroendocrine tumors: Secondary | ICD-10-CM

## 2016-10-11 DIAGNOSIS — C799 Secondary malignant neoplasm of unspecified site: Secondary | ICD-10-CM | POA: Diagnosis not present

## 2016-10-11 DIAGNOSIS — G629 Polyneuropathy, unspecified: Secondary | ICD-10-CM | POA: Diagnosis not present

## 2016-10-11 DIAGNOSIS — C182 Malignant neoplasm of ascending colon: Secondary | ICD-10-CM

## 2016-10-11 DIAGNOSIS — Z79899 Other long term (current) drug therapy: Secondary | ICD-10-CM | POA: Diagnosis not present

## 2016-10-11 DIAGNOSIS — I700 Atherosclerosis of aorta: Secondary | ICD-10-CM | POA: Diagnosis not present

## 2016-10-11 DIAGNOSIS — R590 Localized enlarged lymph nodes: Secondary | ICD-10-CM

## 2016-10-11 DIAGNOSIS — D1771 Benign lipomatous neoplasm of kidney: Secondary | ICD-10-CM | POA: Diagnosis not present

## 2016-10-11 DIAGNOSIS — E669 Obesity, unspecified: Secondary | ICD-10-CM | POA: Diagnosis not present

## 2016-10-11 DIAGNOSIS — C7B8 Other secondary neuroendocrine tumors: Secondary | ICD-10-CM | POA: Diagnosis not present

## 2016-10-11 DIAGNOSIS — Z7189 Other specified counseling: Secondary | ICD-10-CM

## 2016-10-11 DIAGNOSIS — I10 Essential (primary) hypertension: Secondary | ICD-10-CM

## 2016-10-11 DIAGNOSIS — Z5111 Encounter for antineoplastic chemotherapy: Secondary | ICD-10-CM

## 2016-10-11 DIAGNOSIS — C189 Malignant neoplasm of colon, unspecified: Secondary | ICD-10-CM

## 2016-10-11 DIAGNOSIS — R112 Nausea with vomiting, unspecified: Secondary | ICD-10-CM

## 2016-10-11 LAB — CBC WITH DIFFERENTIAL/PLATELET
BASOS ABS: 0 10*3/uL (ref 0–0.1)
Basophils Relative: 1 %
Eosinophils Absolute: 0.2 10*3/uL (ref 0–0.7)
Eosinophils Relative: 5 %
HEMATOCRIT: 34.2 % — AB (ref 35.0–47.0)
HEMOGLOBIN: 12.1 g/dL (ref 12.0–16.0)
LYMPHS PCT: 33 %
Lymphs Abs: 1.2 10*3/uL (ref 1.0–3.6)
MCH: 35.1 pg — ABNORMAL HIGH (ref 26.0–34.0)
MCHC: 35.5 g/dL (ref 32.0–36.0)
MCV: 99 fL (ref 80.0–100.0)
Monocytes Absolute: 0.4 10*3/uL (ref 0.2–0.9)
Monocytes Relative: 10 %
NEUTROS ABS: 1.8 10*3/uL (ref 1.4–6.5)
NEUTROS PCT: 51 %
Platelets: 225 10*3/uL (ref 150–440)
RBC: 3.45 MIL/uL — ABNORMAL LOW (ref 3.80–5.20)
RDW: 15.5 % — ABNORMAL HIGH (ref 11.5–14.5)
WBC: 3.6 10*3/uL (ref 3.6–11.0)

## 2016-10-11 LAB — COMPREHENSIVE METABOLIC PANEL
ALBUMIN: 3.8 g/dL (ref 3.5–5.0)
ALK PHOS: 90 U/L (ref 38–126)
ALT: 27 U/L (ref 14–54)
AST: 36 U/L (ref 15–41)
Anion gap: 5 (ref 5–15)
BILIRUBIN TOTAL: 0.6 mg/dL (ref 0.3–1.2)
BUN: 13 mg/dL (ref 6–20)
CALCIUM: 8.9 mg/dL (ref 8.9–10.3)
CO2: 23 mmol/L (ref 22–32)
CREATININE: 1.07 mg/dL — AB (ref 0.44–1.00)
Chloride: 109 mmol/L (ref 101–111)
GFR calc Af Amer: >60 mL/min (ref >60)
GFR calc non Af Amer: 53 mL/min — ABNORMAL LOW (ref >60)
GLUCOSE: 109 mg/dL — AB (ref 65–99)
POTASSIUM: 4.1 mmol/L (ref 3.5–5.1)
Sodium: 137 mmol/L (ref 135–145)
TOTAL PROTEIN: 7.7 g/dL (ref 6.5–8.1)

## 2016-10-11 LAB — MAGNESIUM: Magnesium: 1.9 mg/dL (ref 1.7–2.4)

## 2016-10-11 MED ORDER — SODIUM CHLORIDE 0.9% FLUSH
10.0000 mL | Freq: Once | INTRAVENOUS | Status: AC
  Administered 2016-10-11: 10 mL via INTRAVENOUS
  Filled 2016-10-11: qty 10

## 2016-10-11 MED ORDER — DEXTROSE 5 % IV SOLN
85.0000 mg/m2 | Freq: Once | INTRAVENOUS | Status: AC
  Administered 2016-10-11: 170 mg via INTRAVENOUS
  Filled 2016-10-11: qty 34

## 2016-10-11 MED ORDER — SODIUM CHLORIDE 0.9 % IV SOLN
Freq: Once | INTRAVENOUS | Status: AC
  Administered 2016-10-11: 10:00:00 via INTRAVENOUS
  Filled 2016-10-11: qty 5

## 2016-10-11 MED ORDER — PALONOSETRON HCL INJECTION 0.25 MG/5ML
0.2500 mg | Freq: Once | INTRAVENOUS | Status: AC
  Administered 2016-10-11: 0.25 mg via INTRAVENOUS
  Filled 2016-10-11: qty 5

## 2016-10-11 MED ORDER — SODIUM CHLORIDE 0.9 % IV SOLN
Freq: Once | INTRAVENOUS | Status: AC
  Administered 2016-10-11: 14:00:00 via INTRAVENOUS
  Filled 2016-10-11: qty 100

## 2016-10-11 MED ORDER — LEUCOVORIN CALCIUM INJECTION 350 MG
800.0000 mg | Freq: Once | INTRAMUSCULAR | Status: AC
  Administered 2016-10-11: 800 mg via INTRAVENOUS
  Filled 2016-10-11: qty 40

## 2016-10-11 MED ORDER — HEPARIN SOD (PORK) LOCK FLUSH 100 UNIT/ML IV SOLN
500.0000 [IU] | Freq: Once | INTRAVENOUS | Status: DC
Start: 2016-10-11 — End: 2016-10-11
  Filled 2016-10-11: qty 5

## 2016-10-11 MED ORDER — DEXTROSE 5 % IV SOLN
Freq: Once | INTRAVENOUS | Status: AC
  Administered 2016-10-11: 09:00:00 via INTRAVENOUS
  Filled 2016-10-11: qty 1000

## 2016-10-11 MED ORDER — SODIUM CHLORIDE 0.9 % IV SOLN
2400.0000 mg/m2 | INTRAVENOUS | Status: DC
  Administered 2016-10-11: 4850 mg via INTRAVENOUS
  Filled 2016-10-11: qty 97

## 2016-10-11 NOTE — Assessment & Plan Note (Addendum)
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   # Proceed with FOLFOX No. 3 today. [ continue to Hold Avastin. Last Avastin March 12th]. Labs today reviewed;  acceptable for treatment today.   # # Given the increasing size of the primary cecal mass- fortunately patient is asymptomatic at this time.  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    #  follow-up with me in 2 weeks/ labs/ no chemo.

## 2016-10-11 NOTE — Progress Notes (Signed)
[place] NOTE  [name], [name], [name], [name], RN as Registered Nurse [name], MD as Consulting Physician (Internal Medicine)  CHIEF COMPLAINTS/PURPOSE OF CONSULTATION:   Oncology History   # MARCH/APRIL 2017-NEUROENDOCRINE CA STAGE IV; [name]; [name]; [name]; [name]; [name]; [name]; [name]; July 14th  2017 CT- PR  # AUG 28th- FOLFIRI +Avastin; OCT 5th CT scan- PR.   # MARCH 20th- CT STABLE liver lesions; ? Enlarging cecal lesion [CEA rising]; March 26th- FOLFOX [last avastin march 12th 2018]  # right kidney lesion 2.6 x 1.6 cm ? Angiolipoma [followed by Urology in past]     Malignant neoplasm of ascending colon ([place])   09/23/2015 Initial Diagnosis    Malignant neoplasm of ascending colon ([place])       HISTORY OF PRESENTING ILLNESS:  Kathleen James 66 y.o.  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  In the interim patient was evaluated by Dr. Tollie Pizza from surgery with the plan for right hemicolectomy in approximately 2 weeks from now.  Also complains of mild jaw pain for the few days post-chemotherapy- then symptoms improve. Mild nausea postchemotherapy currently resolved. Denies any abdominal pain. He denies any constipation. Appetite is good. No pain. No swelling of the legs. Denies any tingling or numbness. No shortness of breath or chest pain. No cough.  ROS: A complete 10 point review of system is done which is negative except mentioned above in history of present  illness  MEDICAL HISTORY:  Past Medical History:  Diagnosis Date  . Angiolipoma of kidney    followed with serial CTs ,,  Dr. Jacqlyn Larsen  . Arthritis   . Cancer ([place])    colon  . Chemotherapy induced diarrhea   . Hammer toe   . Hypertension   . Neuro-endocrine carcinoma ([place])   . Obesity (BMI 30-39.9)     SURGICAL HISTORY: Past Surgical History:  Procedure Laterality Date  . ABDOMINAL HYSTERECTOMY  1995   endometriosis, heavy bleeding  . BUNIONECTOMY Bilateral   . CESAREAN SECTION  1985  . COLONOSCOPY WITH PROPOFOL N/A 10/09/2015   Procedure: COLONOSCOPY WITH PROPOFOL;  Surgeon: Robert Bellow, MD;  Location: [facility] ENDOSCOPY;  Service: Endoscopy;  Laterality: N/A;  . IR GENERIC HISTORICAL  04/07/2016   IR RADIOLOGIST EVAL & MGMT 04/07/2016 Arne Cleveland, MD [facility] INTERV RAD  . OOPHORECTOMY    . PORTACATH PLACEMENT Left 10/01/2015   Procedure: INSERTION PORT-A-CATH;  Surgeon: Robert Bellow, MD;  Location: [facility] ORS;  Service: General;  Laterality: Left;  . ROTATOR CUFF REPAIR Right 2008   right shoulder.  [place]     SOCIAL HISTORY: She lives at home with her family in [place]. 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 Social History   Social History  . Marital status: Married    Spouse name: N/A  . Number of children: N/A  . Years of education: N/A   Occupational History  . Not on file.   Social History Main Topics  . Smoking  status: Never Smoker  . Smokeless tobacco: Never Used  . Alcohol use 0.0 oz/week     Comment: occasionally  . Drug use: No  . Sexual activity: Not Currently   Other Topics Concern  . Not on file   Social History Narrative  . No narrative on file    FAMILY HISTORY: Family History  Problem Relation Age of Onset  . Mental illness Mother 62    bipolar, dementia  . Cancer Maternal Aunt 70    breast ca  . Hypertension Father   . Stroke Father 84    deceased  . COPD Father   . Heart disease  Sister     deceased    ALLERGIES:  is allergic to bee venom and morphine and related.  MEDICATIONS:  Current Outpatient Prescriptions  Medication Sig Dispense Refill  . ALPRAZolam (XANAX) 0.25 MG tablet Take 1 tablet (0.25 mg total) by mouth 2 (two) times daily as needed for anxiety or sleep. (Patient taking differently: Take 0.25 mg by mouth 2 (two) times daily as needed for anxiety or sleep. Patient scores the tablet in half-) 60 tablet 3  . amLODipine (NORVASC) 5 MG tablet TAKE ONE TABLET BY MOUTH EVERY DAY 30 tablet 3  . lidocaine-prilocaine (EMLA) cream Apply 1 application topically as needed. Apply to port a cath site 1 hour prior to chemotherapy treatments 30 g 6  . Multiple Vitamins-Minerals (MULTIVITAMIN ADULT PO) Take 1 tablet by mouth daily.     Marland Kitchen olmesartan (BENICAR) 40 MG tablet Take 1 tablet (40 mg total) by mouth daily. 30 tablet 2  . potassium chloride (K-DUR) 10 MEQ tablet Take 1 tablet (10 mEq total) by mouth 2 (two) times daily. 60 tablet 3  . diphenoxylate-atropine (LOMOTIL) 2.5-0.025 MG tablet TAKE 1 TABLET FOUR TIMES DAILY AS NEEDEDFOR DIARRHEA OR LOOSE STOOLS (Patient not taking: Reported on 10/11/2016) 30 tablet 3  . loperamide (IMODIUM A-D) 2 MG tablet Take 2 mg by mouth 4 (four) times daily as needed for diarrhea or loose stools.    Marland Kitchen neomycin (MYCIFRADIN) 500 MG tablet Take two (2) tablets at 6 pm and two (2) tablets at 11 pm the evening prior to surgery. (Patient not taking: Reported on 10/11/2016) 4 tablet 0  . ondansetron (ZOFRAN) 8 MG tablet Take 1 tablet (8 mg total) by mouth every 6 (six) hours as needed for nausea or vomiting. (Patient not taking: Reported on 08/09/2016) 30 tablet 3  . polyethylene glycol powder (GLYCOLAX/MIRALAX) powder 255 grams one bottle for colonoscopy prep (Patient not taking: Reported on 10/11/2016) 255 g 0  . prochlorperazine (COMPAZINE) 10 MG tablet Take 1 tablet (10 mg total) by mouth every 6 (six) hours as needed for nausea or vomiting.  (Patient not taking: Reported on 08/09/2016) 30 tablet 6   No current facility-administered medications for this visit.    Facility-Administered Medications Ordered in Other Visits  Medication Dose Route Frequency Provider Last Rate Last Dose  . fluorouracil (ADRUCIL) 4,850 mg in sodium chloride 0.9 % 53 mL chemo infusion  2,400 mg/m2 (Treatment Plan Recorded) Intravenous 1 day or 1 dose [name], MD      . heparin lock flush 100 unit/mL  500 Units Intravenous Once [name], MD      . heparin lock flush 100 unit/mL  500 Units Intravenous Once [name], MD      . sodium chloride 0.9 % 100 mL with calcium gluconate 1 g, magnesium sulfate 2 g infusion  Intravenous Once [name], MD   Stopped at 10/11/16 1433  . sodium chloride flush (NS) 0.9 % injection 10 mL  10 mL Intravenous PRN [name], MD   10 mL at 11/24/15 0836      .  PHYSICAL EXAMINATION: ECOG PERFORMANCE STATUS: 0 - Asymptomatic  Vitals:   10/11/16 0852  BP: (!) 146/94  Pulse: 92  Resp: 18  Temp: 97.8 F (36.6 C)   Filed Weights   10/11/16 0852  Weight: 201 lb 3.2 oz (91.3 kg)    GENERAL: Well-nourished well-developed; Alert, no distress and comfortable. She is with daughter.  EYES: no pallor or icterus OROPHARYNX: no thrush or ulceration; good dentition  NECK: supple, no masses felt LYMPH:  no palpable lymphadenopathy in the cervical, axillary or inguinal regions LUNGS: clear to auscultation and  No wheeze or crackles HEART/CVS: regular rate & rhythm and no murmurs; No lower extremity edema ABDOMEN: abdomen soft, non-tender and normal bowel sounds; positive for hepatomegaly. Musculoskeletal:no cyanosis of digits and no clubbing  PSYCH: alert & oriented x 3 with fluent speech NEURO: no focal motor/sensory deficits SKIN:  no rashes or significant lesions; Mediport is in place.   LABORATORY DATA:  I have reviewed the data as listed Lab Results  Component  Value Date   WBC 3.6 10/11/2016   HGB 12.1 10/11/2016   HCT 34.2 (L) 10/11/2016   MCV 99.0 10/11/2016   PLT 225 10/11/2016    Recent Labs  09/13/16 0935  09/27/16 0935 10/05/16 1305 10/11/16 0838  NA 136  < > 137 135 137  K 3.9  < > 3.7 4.2 4.1  CL 108  < > 107 103 109  CO2 24  < > 25 28 23   GLUCOSE 79  < > 100* 100* 109*  BUN 20  < > 15 16 13   CREATININE 1.01*  < > 0.89 0.98 1.07*  CALCIUM 8.8*  < > 9.0 9.3 8.9  GFRNONAA 57*  < > >60 59* 53*  GFRAA >60  < > >60 >60 >60  PROT 7.5  --  7.6  --  7.7  ALBUMIN 3.5  --  3.8  --  3.8  AST 24  --  30  --  36  ALT 19  --  23  --  27  ALKPHOS 80  --  79  --  90  BILITOT 0.4  --  0.4  --  0.6  < > = values in this interval not displayed.  RADIOGRAPHIC STUDIES: I have personally reviewed the radiological images as listed and agreed with the findings in the report. No results found.  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   # Proceed with FOLFOX No. 3 today. [ continue to Hold Avastin. Last Avastin March 12th]. Labs today reviewed;  acceptable for treatment today.   # # Given the increasing size of the primary cecal mass- fortunately patient is asymptomatic at this time.  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    #  follow-up with me in 2 weeks/ labs/ no chemo.      [name], MD 10/11/2016 1:46 PM

## 2016-10-12 LAB — CEA: CEA: 254.9 ng/mL — ABNORMAL HIGH (ref 0.0–4.7)

## 2016-10-13 ENCOUNTER — Inpatient Hospital Stay: Payer: Medicare HMO

## 2016-10-13 ENCOUNTER — Encounter: Payer: Self-pay | Admitting: *Deleted

## 2016-10-13 VITALS — BP 147/93 | HR 88 | Resp 20

## 2016-10-13 DIAGNOSIS — C799 Secondary malignant neoplasm of unspecified site: Secondary | ICD-10-CM

## 2016-10-13 DIAGNOSIS — C182 Malignant neoplasm of ascending colon: Secondary | ICD-10-CM

## 2016-10-13 DIAGNOSIS — Z5111 Encounter for antineoplastic chemotherapy: Secondary | ICD-10-CM | POA: Diagnosis not present

## 2016-10-13 DIAGNOSIS — C189 Malignant neoplasm of colon, unspecified: Secondary | ICD-10-CM

## 2016-10-13 MED ORDER — HEPARIN SOD (PORK) LOCK FLUSH 100 UNIT/ML IV SOLN
500.0000 [IU] | Freq: Once | INTRAVENOUS | Status: AC | PRN
  Administered 2016-10-13: 500 [IU]
  Filled 2016-10-13: qty 5

## 2016-10-13 MED ORDER — SODIUM CHLORIDE 0.9% FLUSH
10.0000 mL | INTRAVENOUS | Status: DC | PRN
  Administered 2016-10-13: 10 mL
  Filled 2016-10-13: qty 10

## 2016-10-19 ENCOUNTER — Other Ambulatory Visit: Payer: Self-pay | Admitting: Internal Medicine

## 2016-10-19 ENCOUNTER — Encounter
Admission: RE | Admit: 2016-10-19 | Discharge: 2016-10-19 | Disposition: A | Discharge status: Home / Self Care | Payer: Medicare HMO | Source: Ambulatory Visit | Attending: General Surgery | Admitting: General Surgery

## 2016-10-19 DIAGNOSIS — Z01818 Encounter for other preprocedural examination: Secondary | ICD-10-CM | POA: Insufficient documentation

## 2016-10-19 DIAGNOSIS — C182 Malignant neoplasm of ascending colon: Secondary | ICD-10-CM | POA: Insufficient documentation

## 2016-10-19 DIAGNOSIS — I10 Essential (primary) hypertension: Secondary | ICD-10-CM | POA: Insufficient documentation

## 2016-10-19 DIAGNOSIS — R9431 Abnormal electrocardiogram [ECG] [EKG]: Secondary | ICD-10-CM | POA: Insufficient documentation

## 2016-10-19 DIAGNOSIS — Z01812 Encounter for preprocedural laboratory examination: Secondary | ICD-10-CM | POA: Insufficient documentation

## 2016-10-19 HISTORY — DX: Malignant neoplasm of liver, not specified as primary or secondary: C22.9

## 2016-10-19 HISTORY — DX: Anxiety disorder, unspecified: F41.9

## 2016-10-19 NOTE — Telephone Encounter (Signed)
   Ref Range & Units 8d ago  Potassium 3.5 - 5.1 mmol/L 4.1

## 2016-10-19 NOTE — Patient Instructions (Signed)
  Your procedure is scheduled on: 10/26/16 Report to Day Surgery. [facility] SECOND FLOOR To find out your arrival time please call [phone] between 1PM - 3PM on 10/25/16.  Remember: Instructions that are not followed completely may result in serious medical risk, up to and including death, or upon the discretion of your surgeon and anesthesiologist your surgery may need to be rescheduled.    _X___ 1. Do not eat food or drink liquids after midnight. No gum chewing or hard candies.     _X___ 2. No Alcohol for 24 hours before or after surgery.   ____ 3. Do Not Smoke For 24 Hours Prior to Your Surgery.   ____ 4. Bring all medications with you on the day of surgery if instructed.    _X___ 5. Notify your doctor if there is any change in your medical condition     (cold, fever, infections).       Do not wear jewelry, make-up, hairpins, clips or nail polish.  Do not wear lotions, powders, or perfumes. You may wear deodorant.  Do not shave 48 hours prior to surgery. Men may shave face and neck.  Do not bring valuables to the hospital.    [facility] is not responsible for any belongings or valuables.               Contacts, dentures or bridgework may not be worn into surgery.  Leave your suitcase in the car. After surgery it may be brought to your room.  For patients admitted to the hospital, discharge time is determined by your                treatment team.   Patients discharged the day of surgery will not be allowed to drive home.   Please read over the following fact sheets that you were given:   Surgical Site Infection Prevention / MRSA _X___ Take these medicines the morning of surgery with A SIP OF WATER:    1. ALPRAZLOLAM  2. AMLODIPINE  3. BENICAR  4.  5.  6.  ____ Fleet Enema (as directed)   __X__ Use CHG Soap as directed  ____ Use inhalers on the day of surgery  ____ Stop metformin 2 days prior to surgery    ____ Take 1/2 of usual insulin dose the night before  surgery and none on the morning of surgery.   ____ Stop Coumadin/Plavix/aspirin on ____ Stop Anti-inflammatories on    ____ Stop supplements until after surgery.    ____ Bring C-Pap to the hospital.

## 2016-10-21 ENCOUNTER — Encounter
Admission: RE | Admit: 2016-10-21 | Discharge: 2016-10-21 | Disposition: A | Discharge status: Home / Self Care | Payer: Medicare HMO | Source: Ambulatory Visit | Attending: General Surgery | Admitting: General Surgery

## 2016-10-21 DIAGNOSIS — C182 Malignant neoplasm of ascending colon: Secondary | ICD-10-CM | POA: Diagnosis not present

## 2016-10-21 DIAGNOSIS — R9431 Abnormal electrocardiogram [ECG] [EKG]: Secondary | ICD-10-CM | POA: Diagnosis not present

## 2016-10-21 DIAGNOSIS — Z01812 Encounter for preprocedural laboratory examination: Secondary | ICD-10-CM | POA: Diagnosis not present

## 2016-10-21 DIAGNOSIS — I10 Essential (primary) hypertension: Secondary | ICD-10-CM | POA: Diagnosis not present

## 2016-10-21 DIAGNOSIS — Z01818 Encounter for other preprocedural examination: Secondary | ICD-10-CM | POA: Diagnosis not present

## 2016-10-21 LAB — SURGICAL PCR SCREEN
MRSA, PCR: NEGATIVE
STAPHYLOCOCCUS AUREUS: NEGATIVE

## 2016-10-21 NOTE — Pre-Procedure Instructions (Signed)
CALLED DR Kayleen Memos REGARDING ABNORMAL EKG-  DR Kayleen Memos COMPARED EKG WITH 2008 EKG-OK TO PROCEED PER DR Kayleen Memos

## 2016-10-25 ENCOUNTER — Inpatient Hospital Stay: Payer: Medicare HMO | Attending: Internal Medicine

## 2016-10-25 ENCOUNTER — Inpatient Hospital Stay (HOSPITAL_BASED_OUTPATIENT_CLINIC_OR_DEPARTMENT_OTHER): Payer: Medicare HMO | Admitting: Internal Medicine

## 2016-10-25 VITALS — BP 125/88 | HR 86 | Temp 96.1°F | Resp 16 | Wt 198.5 lb

## 2016-10-25 DIAGNOSIS — F419 Anxiety disorder, unspecified: Secondary | ICD-10-CM

## 2016-10-25 DIAGNOSIS — C7B8 Other secondary neuroendocrine tumors: Secondary | ICD-10-CM | POA: Diagnosis not present

## 2016-10-25 DIAGNOSIS — Z803 Family history of malignant neoplasm of breast: Secondary | ICD-10-CM

## 2016-10-25 DIAGNOSIS — Z5111 Encounter for antineoplastic chemotherapy: Secondary | ICD-10-CM | POA: Diagnosis not present

## 2016-10-25 DIAGNOSIS — D1771 Benign lipomatous neoplasm of kidney: Secondary | ICD-10-CM | POA: Insufficient documentation

## 2016-10-25 DIAGNOSIS — M129 Arthropathy, unspecified: Secondary | ICD-10-CM

## 2016-10-25 DIAGNOSIS — C7A1 Malignant poorly differentiated neuroendocrine tumors: Secondary | ICD-10-CM | POA: Insufficient documentation

## 2016-10-25 DIAGNOSIS — I10 Essential (primary) hypertension: Secondary | ICD-10-CM | POA: Diagnosis not present

## 2016-10-25 DIAGNOSIS — C182 Malignant neoplasm of ascending colon: Secondary | ICD-10-CM

## 2016-10-25 DIAGNOSIS — Z79899 Other long term (current) drug therapy: Secondary | ICD-10-CM

## 2016-10-25 DIAGNOSIS — E669 Obesity, unspecified: Secondary | ICD-10-CM | POA: Insufficient documentation

## 2016-10-25 DIAGNOSIS — R69 Illness, unspecified: Secondary | ICD-10-CM | POA: Diagnosis not present

## 2016-10-25 LAB — COMPREHENSIVE METABOLIC PANEL
ALBUMIN: 3.9 g/dL (ref 3.5–5.0)
ALK PHOS: 83 U/L (ref 38–126)
ALT: 26 U/L (ref 14–54)
ANION GAP: 4 — AB (ref 5–15)
AST: 29 U/L (ref 15–41)
BUN: 13 mg/dL (ref 6–20)
CALCIUM: 9.3 mg/dL (ref 8.9–10.3)
CHLORIDE: 108 mmol/L (ref 101–111)
CO2: 25 mmol/L (ref 22–32)
Creatinine, Ser: 0.97 mg/dL (ref 0.44–1.00)
GFR calc non Af Amer: 60 mL/min — ABNORMAL LOW (ref >60)
Glucose, Bld: 84 mg/dL (ref 65–99)
Potassium: 3.9 mmol/L (ref 3.5–5.1)
Sodium: 137 mmol/L (ref 135–145)
Total Bilirubin: 0.5 mg/dL (ref 0.3–1.2)
Total Protein: 8.2 g/dL — ABNORMAL HIGH (ref 6.5–8.1)

## 2016-10-25 LAB — CBC WITH DIFFERENTIAL/PLATELET
BASOS ABS: 0 10*3/uL (ref 0–0.1)
BASOS PCT: 1 %
Eosinophils Absolute: 0.1 10*3/uL (ref 0–0.7)
Eosinophils Relative: 4 %
HEMATOCRIT: 34.5 % — AB (ref 35.0–47.0)
HEMOGLOBIN: 12.3 g/dL (ref 12.0–16.0)
LYMPHS PCT: 29 %
Lymphs Abs: 1.2 10*3/uL (ref 1.0–3.6)
MCH: 35.3 pg — ABNORMAL HIGH (ref 26.0–34.0)
MCHC: 35.6 g/dL (ref 32.0–36.0)
MCV: 99 fL (ref 80.0–100.0)
Monocytes Absolute: 0.4 10*3/uL (ref 0.2–0.9)
Monocytes Relative: 10 %
NEUTROS ABS: 2.2 10*3/uL (ref 1.4–6.5)
NEUTROS PCT: 56 %
Platelets: 211 10*3/uL (ref 150–440)
RBC: 3.49 MIL/uL — ABNORMAL LOW (ref 3.80–5.20)
RDW: 16.4 % — AB (ref 11.5–14.5)
WBC: 4 10*3/uL (ref 3.6–11.0)

## 2016-10-25 LAB — MAGNESIUM: Magnesium: 1.9 mg/dL (ref 1.7–2.4)

## 2016-10-25 NOTE — Assessment & Plan Note (Addendum)
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  # s/p  FOLFOX No. 3 appx 2 weeks ago. Labs today reviewed; pt going for surgery tomorrow.   # will plan on ordering foundation One testing on primary tumor. Discussed with pt/family the rationale. Also discussed the role of y90 to liver lesions.   #  follow-up with me in 2 weeks-today by pathology-we'll order foundation one testing at the time/ labs/ no chemo.

## 2016-10-25 NOTE — Progress Notes (Signed)
[place] NOTE  [name], [name], MD (Internal Medicine) Bary Castilla, Forest Gleason, [name], RN as Registered Nurse [name], MD as Consulting Physician (Internal Medicine)  CHIEF COMPLAINTS/PURPOSE OF CONSULTATION:   Oncology History   # MARCH/APRIL 2017-NEUROENDOCRINE CA STAGE IV; [name]; [name]; [name]; [name]; [name]; [name]; [name]; July 14th  2017 CT- PR  # AUG 28th- FOLFIRI +Avastin; OCT 5th CT scan- PR.   # MARCH 20th- CT STABLE liver lesions; ? Enlarging cecal lesion [CEA rising]; March 26th- FOLFOX [last avastin march 12th 2018]  # right kidney lesion 2.6 x 1.6 cm ? Angiolipoma [followed by Urology in past]     Malignant neoplasm of ascending colon ([place])   09/23/2015 Initial Diagnosis    Malignant neoplasm of ascending colon ([place])       HISTORY OF PRESENTING ILLNESS:  Kathleen James 66 y.o.  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   Patient is awaiting surgery for the primary cecal cancer tomorrow.  Denies any significant nausea vomiting. Denies any significant diarrhea. She denies any constipation. Appetite is good. No pain. No swelling of the legs. Denies any tingling or numbness. No shortness of breath or chest pain. No cough.  ROS: A complete 10 point review of system is done which is negative except mentioned above in history of present illness  MEDICAL HISTORY:  Past Medical History:  Diagnosis Date  . Angiolipoma of kidney    followed with serial CTs ,,  Dr. Jacqlyn Larsen  . Anxiety   . Arthritis   .  Cancer ([place])    colon  . Chemotherapy induced diarrhea   . Hammer toe   . Hypertension   . Liver cancer ([place])   . Neuro-endocrine carcinoma ([place])   . Obesity (BMI 30-39.9)     SURGICAL HISTORY: Past Surgical History:  Procedure Laterality Date  . ABDOMINAL HYSTERECTOMY  1995   endometriosis, heavy bleeding  . BUNIONECTOMY Bilateral   . CESAREAN SECTION  1985  . COLONOSCOPY WITH PROPOFOL N/A 10/09/2015   Procedure: COLONOSCOPY WITH PROPOFOL;  Surgeon: Robert Bellow, MD;  Location: [facility] ENDOSCOPY;  Service: Endoscopy;  Laterality: N/A;  . IR GENERIC HISTORICAL  04/07/2016   IR RADIOLOGIST EVAL & MGMT 04/07/2016 Arne Cleveland, MD [facility] INTERV RAD  . OOPHORECTOMY    . PORTACATH PLACEMENT Left 10/01/2015   Procedure: INSERTION PORT-A-CATH;  Surgeon: Robert Bellow, MD;  Location: [facility] ORS;  Service: General;  Laterality: Left;  . ROTATOR CUFF REPAIR Right 2008   right shoulder.  [place]     SOCIAL HISTORY: She lives at home with her family in [place]. 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 Social History   Social History  . Marital status: Married    Spouse name: N/A  . Number of children: N/A  . Years of education: N/A   Occupational History  . Not on file.   Social History Main Topics  . Smoking status: Never Smoker  . Smokeless tobacco: Never Used  . Alcohol use 0.0 oz/week  Comment: occasionally  . Drug use: No  . Sexual activity: Not Currently   Other Topics Concern  . Not on file   Social History Narrative  . No narrative on file    FAMILY HISTORY: Family History  Problem Relation Age of Onset  . Mental illness Mother 57    bipolar, dementia  . Cancer Maternal Aunt 70    breast ca  . Hypertension Father   . Stroke Father 32    deceased  . COPD Father   . Heart disease Sister     deceased    ALLERGIES:  is allergic to bee venom and morphine and related.  MEDICATIONS:  Current Outpatient  Prescriptions  Medication Sig Dispense Refill  . ALPRAZolam (XANAX) 0.25 MG tablet TAKE 1 TABLET TWICE DAILY AS NEEDED FOR ANXIETY OR SLEEP 60 tablet 1  . amLODipine (NORVASC) 5 MG tablet TAKE ONE TABLET BY MOUTH EVERY DAY 30 tablet 3  . Biotin w/ Vitamins C & E (HAIR/SKIN/NAILS PO) Take 1 tablet by mouth daily.    . Chlorpheniramine Maleate (CHLOR-TABLETS PO) Take 1 tablet by mouth every evening.    . diphenoxylate-atropine (LOMOTIL) 2.5-0.025 MG tablet TAKE 1 TABLET FOUR TIMES DAILY AS NEEDEDFOR DIARRHEA OR LOOSE STOOLS 30 tablet 3  . lidocaine-prilocaine (EMLA) cream Apply 1 application topically as needed. Apply to port a cath site 1 hour prior to chemotherapy treatments 30 g 6  . loperamide (IMODIUM A-D) 2 MG tablet Take 2 mg by mouth 4 (four) times daily as needed for diarrhea or loose stools.    . naproxen sodium (ANAPROX) 220 MG tablet Take 440 mg by mouth 2 (two) times daily as needed.    . neomycin (MYCIFRADIN) 500 MG tablet Take two (2) tablets at 6 pm and two (2) tablets at 11 pm the evening prior to surgery. 4 tablet 0  . olmesartan (BENICAR) 40 MG tablet Take 1 tablet (40 mg total) by mouth daily. 30 tablet 2  . ondansetron (ZOFRAN) 8 MG tablet Take 1 tablet (8 mg total) by mouth every 6 (six) hours as needed for nausea or vomiting. (Patient taking differently: Take 8 mg by mouth every 6 (six) hours as needed for nausea or vomiting. ) 30 tablet 3  . polyethylene glycol powder (GLYCOLAX/MIRALAX) powder 255 grams one bottle for colonoscopy prep 255 g 0  . potassium chloride (K-DUR) 10 MEQ tablet TAKE ONE TABLET BY MOUTH TWICE DAILY 60 tablet 4  . prochlorperazine (COMPAZINE) 10 MG tablet Take 1 tablet (10 mg total) by mouth every 6 (six) hours as needed for nausea or vomiting. (Patient taking differently: Take 10 mg by mouth every 6 (six) hours as needed for nausea or vomiting. ) 30 tablet 6   No current facility-administered medications for this visit.    Facility-Administered  Medications Ordered in Other Visits  Medication Dose Route Frequency Provider Last Rate Last Dose  . heparin lock flush 100 unit/mL  500 Units Intravenous Once Charlaine Dalton R, MD      . sodium chloride flush (NS) 0.9 % injection 10 mL  10 mL Intravenous PRN [name], MD   10 mL at 11/24/15 0836      .  PHYSICAL EXAMINATION: ECOG PERFORMANCE STATUS: 0 - Asymptomatic  Vitals:   10/25/16 0840  BP: 125/88  Pulse: 86  Resp: 16  Temp: (!) 96.1 F (35.6 C)   Filed Weights   10/25/16 0840  Weight: 198 lb 8 oz (90 kg)    GENERAL:  Well-nourished well-developed; Alert, no distress and comfortable. She is with daughter.  EYES: no pallor or icterus OROPHARYNX: no thrush or ulceration; good dentition  NECK: supple, no masses felt LYMPH:  no palpable lymphadenopathy in the cervical, axillary or inguinal regions LUNGS: clear to auscultation and  No wheeze or crackles HEART/CVS: regular rate & rhythm and no murmurs; No lower extremity edema ABDOMEN: abdomen soft, non-tender and normal bowel sounds; positive for hepatomegaly. Musculoskeletal:no cyanosis of digits and no clubbing  PSYCH: alert & oriented x 3 with fluent speech NEURO: no focal motor/sensory deficits SKIN:  no rashes or significant lesions; Mediport is in place.   LABORATORY DATA:  I have reviewed the data as listed Lab Results  Component Value Date   WBC 4.0 10/25/2016   HGB 12.3 10/25/2016   HCT 34.5 (L) 10/25/2016   MCV 99.0 10/25/2016   PLT 211 10/25/2016    Recent Labs  09/27/16 0935 10/05/16 1305 10/11/16 0838 10/25/16 0819  NA 137 135 137 137  K 3.7 4.2 4.1 3.9  CL 107 103 109 108  CO2 25 28 23 25   GLUCOSE 100* 100* 109* 84  BUN 15 16 13 13   CREATININE 0.89 0.98 1.07* 0.97  CALCIUM 9.0 9.3 8.9 9.3  GFRNONAA >60 59* 53* 60*  GFRAA >60 >60 >60 >60  PROT 7.6  --  7.7 8.2*  ALBUMIN 3.8  --  3.8 3.9  AST 30  --  36 29  ALT 23  --  27 26  ALKPHOS 79  --  90 83  BILITOT 0.4  --   0.6 0.5    RADIOGRAPHIC STUDIES: I have personally reviewed the radiological images as listed and agreed with the findings in the report. No results found.  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  # s/p  FOLFOX No. 3 appx 2 weeks ago. Labs today reviewed; pt going for surgery tomorrow.   # will plan on ordering foundation One testing on primary tumor. Discussed with pt/family the rationale. Also discussed the role of y90 to liver lesions.   #  follow-up with me in 2 weeks-today by pathology-we'll order foundation one testing at the time/ labs/ no chemo.      [name], MD 10/25/2016 4:46 PM

## 2016-10-25 NOTE — Progress Notes (Signed)
Patient here today for follow up.  Patient states no new concerns today  

## 2016-10-26 ENCOUNTER — Inpatient Hospital Stay: Payer: Medicare HMO | Admitting: Anesthesiology

## 2016-10-26 ENCOUNTER — Inpatient Hospital Stay
Admission: RE | Admit: 2016-10-26 | Discharge: 2016-10-29 | DRG: 330 | Disposition: A | Discharge status: Home / Self Care | Payer: Medicare HMO | Source: Ambulatory Visit | Attending: General Surgery | Admitting: General Surgery

## 2016-10-26 ENCOUNTER — Encounter
Admission: RE | Disposition: A | Discharge status: Home / Self Care | Payer: Self-pay | Source: Ambulatory Visit | Attending: General Surgery

## 2016-10-26 DIAGNOSIS — C7A1 Malignant poorly differentiated neuroendocrine tumors: Secondary | ICD-10-CM | POA: Diagnosis not present

## 2016-10-26 DIAGNOSIS — Z79899 Other long term (current) drug therapy: Secondary | ICD-10-CM | POA: Diagnosis not present

## 2016-10-26 DIAGNOSIS — Z886 Allergy status to analgesic agent status: Secondary | ICD-10-CM

## 2016-10-26 DIAGNOSIS — C787 Secondary malignant neoplasm of liver and intrahepatic bile duct: Secondary | ICD-10-CM | POA: Diagnosis present

## 2016-10-26 DIAGNOSIS — J309 Allergic rhinitis, unspecified: Secondary | ICD-10-CM | POA: Diagnosis present

## 2016-10-26 DIAGNOSIS — M8580 Other specified disorders of bone density and structure, unspecified site: Secondary | ICD-10-CM | POA: Diagnosis present

## 2016-10-26 DIAGNOSIS — C189 Malignant neoplasm of colon, unspecified: Secondary | ICD-10-CM | POA: Diagnosis not present

## 2016-10-26 DIAGNOSIS — Z90710 Acquired absence of both cervix and uterus: Secondary | ICD-10-CM

## 2016-10-26 DIAGNOSIS — D1771 Benign lipomatous neoplasm of kidney: Secondary | ICD-10-CM | POA: Diagnosis present

## 2016-10-26 DIAGNOSIS — G4700 Insomnia, unspecified: Secondary | ICD-10-CM | POA: Diagnosis present

## 2016-10-26 DIAGNOSIS — C772 Secondary and unspecified malignant neoplasm of intra-abdominal lymph nodes: Secondary | ICD-10-CM | POA: Diagnosis not present

## 2016-10-26 DIAGNOSIS — F419 Anxiety disorder, unspecified: Secondary | ICD-10-CM | POA: Diagnosis present

## 2016-10-26 DIAGNOSIS — E669 Obesity, unspecified: Secondary | ICD-10-CM | POA: Diagnosis present

## 2016-10-26 DIAGNOSIS — Z6830 Body mass index (BMI) 30.0-30.9, adult: Secondary | ICD-10-CM

## 2016-10-26 DIAGNOSIS — I10 Essential (primary) hypertension: Secondary | ICD-10-CM | POA: Diagnosis present

## 2016-10-26 DIAGNOSIS — C182 Malignant neoplasm of ascending colon: Principal | ICD-10-CM | POA: Diagnosis present

## 2016-10-26 DIAGNOSIS — C7B8 Other secondary neuroendocrine tumors: Secondary | ICD-10-CM | POA: Diagnosis present

## 2016-10-26 DIAGNOSIS — Z91030 Bee allergy status: Secondary | ICD-10-CM

## 2016-10-26 DIAGNOSIS — Z885 Allergy status to narcotic agent status: Secondary | ICD-10-CM

## 2016-10-26 DIAGNOSIS — M1990 Unspecified osteoarthritis, unspecified site: Secondary | ICD-10-CM | POA: Diagnosis present

## 2016-10-26 DIAGNOSIS — R69 Illness, unspecified: Secondary | ICD-10-CM | POA: Diagnosis not present

## 2016-10-26 DIAGNOSIS — C180 Malignant neoplasm of cecum: Secondary | ICD-10-CM | POA: Diagnosis not present

## 2016-10-26 HISTORY — PX: LAPAROSCOPIC RIGHT COLECTOMY: SHX5925

## 2016-10-26 LAB — CEA: CEA: 300.7 ng/mL — ABNORMAL HIGH (ref 0.0–4.7)

## 2016-10-26 SURGERY — COLECTOMY, RIGHT, LAPAROSCOPIC
Anesthesia: General | Laterality: Right | Wound class: Clean Contaminated

## 2016-10-26 MED ORDER — ALVIMOPAN 12 MG PO CAPS
12.0000 mg | ORAL_CAPSULE | Freq: Two times a day (BID) | ORAL | Status: DC
  Administered 2016-10-26 – 2016-10-28 (×4): 12 mg via ORAL
  Filled 2016-10-26 (×4): qty 1

## 2016-10-26 MED ORDER — PROMETHAZINE HCL 25 MG/ML IJ SOLN
12.5000 mg | Freq: Four times a day (QID) | INTRAMUSCULAR | Status: DC | PRN
  Administered 2016-10-26: 12.5 mg via INTRAVENOUS
  Filled 2016-10-26: qty 1

## 2016-10-26 MED ORDER — LIDOCAINE HCL (CARDIAC) 20 MG/ML IV SOLN
INTRAVENOUS | Status: DC | PRN
  Administered 2016-10-26: 100 mg via INTRAVENOUS

## 2016-10-26 MED ORDER — MIDAZOLAM HCL 2 MG/2ML IJ SOLN
INTRAMUSCULAR | Status: AC
  Filled 2016-10-26: qty 2

## 2016-10-26 MED ORDER — PROPOFOL 10 MG/ML IV BOLUS
INTRAVENOUS | Status: AC
  Filled 2016-10-26: qty 40

## 2016-10-26 MED ORDER — OXYCODONE HCL 5 MG/5ML PO SOLN: 5.0000 mg | Freq: Once | ORAL | Status: DC | PRN

## 2016-10-26 MED ORDER — FENTANYL CITRATE (PF) 100 MCG/2ML IJ SOLN
INTRAMUSCULAR | Status: DC | PRN
  Administered 2016-10-26: 50 ug via INTRAVENOUS
  Administered 2016-10-26: 100 ug via INTRAVENOUS
  Administered 2016-10-26 (×2): 50 ug via INTRAVENOUS

## 2016-10-26 MED ORDER — ALVIMOPAN 12 MG PO CAPS
12.0000 mg | ORAL_CAPSULE | Freq: Once | ORAL | Status: AC
  Administered 2016-10-26: 12 mg via ORAL

## 2016-10-26 MED ORDER — SODIUM CHLORIDE 0.9 % IV SOLN: INTRAVENOUS | Status: DC | PRN

## 2016-10-26 MED ORDER — ALVIMOPAN 12 MG PO CAPS
ORAL_CAPSULE | ORAL | Status: AC
  Administered 2016-10-26: 12 mg via ORAL
  Filled 2016-10-26: qty 1

## 2016-10-26 MED ORDER — PROPOFOL 10 MG/ML IV BOLUS
INTRAVENOUS | Status: DC | PRN
  Administered 2016-10-26: 150 mg via INTRAVENOUS

## 2016-10-26 MED ORDER — PHENYLEPHRINE HCL 10 MG/ML IJ SOLN
INTRAMUSCULAR | Status: DC | PRN
  Administered 2016-10-26: 100 ug via INTRAVENOUS
  Administered 2016-10-26 (×2): 200 ug via INTRAVENOUS
  Administered 2016-10-26 (×2): 100 ug via INTRAVENOUS

## 2016-10-26 MED ORDER — MIDAZOLAM HCL 2 MG/2ML IJ SOLN
INTRAMUSCULAR | Status: DC | PRN
Start: 2016-10-26 — End: 2016-10-26
  Administered 2016-10-26: 2 mg via INTRAVENOUS

## 2016-10-26 MED ORDER — GLYCOPYRROLATE 0.2 MG/ML IJ SOLN
INTRAMUSCULAR | Status: AC
  Filled 2016-10-26: qty 1

## 2016-10-26 MED ORDER — PHENYLEPHRINE HCL 10 MG/ML IJ SOLN
INTRAMUSCULAR | Status: AC
  Filled 2016-10-26: qty 1

## 2016-10-26 MED ORDER — ACETAMINOPHEN 10 MG/ML IV SOLN
INTRAVENOUS | Status: AC
  Filled 2016-10-26: qty 100

## 2016-10-26 MED ORDER — HYDROMORPHONE HCL 1 MG/ML IJ SOLN
0.5000 mg | INTRAMUSCULAR | Status: DC | PRN
  Administered 2016-10-26: 0.5 mg via INTRAVENOUS
  Filled 2016-10-26: qty 0.5

## 2016-10-26 MED ORDER — ALPRAZOLAM 0.25 MG PO TABS: 0.2500 mg | ORAL_TABLET | Freq: Two times a day (BID) | ORAL | Status: DC | PRN

## 2016-10-26 MED ORDER — ONDANSETRON HCL 4 MG/2ML IJ SOLN
INTRAMUSCULAR | Status: DC | PRN
  Administered 2016-10-26: 4 mg via INTRAVENOUS

## 2016-10-26 MED ORDER — HYDROCODONE-ACETAMINOPHEN 5-325 MG PO TABS: 1.0000 | ORAL_TABLET | ORAL | Status: DC | PRN

## 2016-10-26 MED ORDER — BUPIVACAINE-EPINEPHRINE (PF) 0.5% -1:200000 IJ SOLN
INTRAMUSCULAR | Status: AC
  Filled 2016-10-26: qty 30

## 2016-10-26 MED ORDER — SODIUM CHLORIDE 0.9 % IV SOLN
1.0000 g | INTRAVENOUS | Status: AC
  Administered 2016-10-26: 1 g via INTRAVENOUS
  Filled 2016-10-26: qty 1

## 2016-10-26 MED ORDER — SUCCINYLCHOLINE CHLORIDE 20 MG/ML IJ SOLN
INTRAMUSCULAR | Status: AC
  Filled 2016-10-26: qty 1

## 2016-10-26 MED ORDER — OXYCODONE HCL 5 MG PO TABS: 5.0000 mg | ORAL_TABLET | Freq: Once | ORAL | Status: DC | PRN

## 2016-10-26 MED ORDER — ROCURONIUM BROMIDE 100 MG/10ML IV SOLN
INTRAVENOUS | Status: DC | PRN
  Administered 2016-10-26: 20 mg via INTRAVENOUS
  Administered 2016-10-26: 50 mg via INTRAVENOUS
  Administered 2016-10-26: 30 mg via INTRAVENOUS

## 2016-10-26 MED ORDER — BUPIVACAINE-EPINEPHRINE (PF) 0.5% -1:200000 IJ SOLN
INTRAMUSCULAR | Status: DC | PRN
  Administered 2016-10-26: 30 mL via PERINEURAL

## 2016-10-26 MED ORDER — LIDOCAINE HCL 2 % EX GEL
CUTANEOUS | Status: AC
  Filled 2016-10-26: qty 5

## 2016-10-26 MED ORDER — LIDOCAINE HCL (PF) 2 % IJ SOLN
INTRAMUSCULAR | Status: AC
Start: 2016-10-26 — End: 2016-10-26
  Filled 2016-10-26: qty 2

## 2016-10-26 MED ORDER — HEPARIN SODIUM (PORCINE) 5000 UNIT/ML IJ SOLN
INTRAMUSCULAR | Status: AC
  Administered 2016-10-26: 5000 [IU] via SUBCUTANEOUS
  Filled 2016-10-26: qty 1

## 2016-10-26 MED ORDER — LACTATED RINGERS IV SOLN: INTRAVENOUS | Status: DC | PRN

## 2016-10-26 MED ORDER — DEXTROSE IN LACTATED RINGERS 5 % IV SOLN
INTRAVENOUS | Status: DC
  Administered 2016-10-26 – 2016-10-27 (×3): via INTRAVENOUS

## 2016-10-26 MED ORDER — ONDANSETRON HCL 4 MG PO TABS: 8.0000 mg | ORAL_TABLET | Freq: Four times a day (QID) | ORAL | Status: DC | PRN

## 2016-10-26 MED ORDER — FAMOTIDINE 20 MG PO TABS
20.0000 mg | ORAL_TABLET | Freq: Once | ORAL | Status: AC
  Administered 2016-10-26: 20 mg via ORAL

## 2016-10-26 MED ORDER — FENTANYL CITRATE (PF) 100 MCG/2ML IJ SOLN
25.0000 ug | INTRAMUSCULAR | Status: DC | PRN
  Administered 2016-10-26 (×2): 25 ug via INTRAVENOUS

## 2016-10-26 MED ORDER — DEXAMETHASONE SODIUM PHOSPHATE 10 MG/ML IJ SOLN
INTRAMUSCULAR | Status: DC | PRN
  Administered 2016-10-26: 10 mg via INTRAVENOUS

## 2016-10-26 MED ORDER — DEXAMETHASONE SODIUM PHOSPHATE 10 MG/ML IJ SOLN
INTRAMUSCULAR | Status: AC
  Filled 2016-10-26: qty 1

## 2016-10-26 MED ORDER — EPHEDRINE SULFATE 50 MG/ML IJ SOLN
INTRAMUSCULAR | Status: AC
  Filled 2016-10-26: qty 1

## 2016-10-26 MED ORDER — AMLODIPINE BESYLATE 5 MG PO TABS
5.0000 mg | ORAL_TABLET | Freq: Every day | ORAL | Status: DC
  Administered 2016-10-27 – 2016-10-29 (×3): 5 mg via ORAL
  Filled 2016-10-26 (×3): qty 1

## 2016-10-26 MED ORDER — ENOXAPARIN SODIUM 40 MG/0.4ML ~~LOC~~ SOLN
40.0000 mg | SUBCUTANEOUS | Status: DC
  Administered 2016-10-27 – 2016-10-28 (×2): 40 mg via SUBCUTANEOUS
  Filled 2016-10-26 (×2): qty 0.4

## 2016-10-26 MED ORDER — ACETAMINOPHEN 10 MG/ML IV SOLN
INTRAVENOUS | Status: DC | PRN
  Administered 2016-10-26: 1000 mg via INTRAVENOUS

## 2016-10-26 MED ORDER — FENTANYL CITRATE (PF) 100 MCG/2ML IJ SOLN
INTRAMUSCULAR | Status: AC
  Administered 2016-10-26: 25 ug via INTRAVENOUS
  Filled 2016-10-26: qty 2

## 2016-10-26 MED ORDER — LACTATED RINGERS IV SOLN
INTRAVENOUS | Status: DC
  Administered 2016-10-26 (×2): via INTRAVENOUS

## 2016-10-26 MED ORDER — HEPARIN SODIUM (PORCINE) 5000 UNIT/ML IJ SOLN
5000.0000 [IU] | Freq: Once | INTRAMUSCULAR | Status: AC
  Administered 2016-10-26: 5000 [IU] via SUBCUTANEOUS

## 2016-10-26 MED ORDER — ACETAMINOPHEN 10 MG/ML IV SOLN
1000.0000 mg | Freq: Four times a day (QID) | INTRAVENOUS | Status: AC
  Administered 2016-10-26 – 2016-10-27 (×4): 1000 mg via INTRAVENOUS
  Filled 2016-10-26 (×4): qty 100

## 2016-10-26 MED ORDER — FENTANYL CITRATE (PF) 250 MCG/5ML IJ SOLN
INTRAMUSCULAR | Status: AC
  Filled 2016-10-26: qty 5

## 2016-10-26 MED ORDER — ROCURONIUM BROMIDE 50 MG/5ML IV SOLN
INTRAVENOUS | Status: AC
  Filled 2016-10-26: qty 1

## 2016-10-26 MED ORDER — FAMOTIDINE 20 MG PO TABS
ORAL_TABLET | ORAL | Status: AC
  Administered 2016-10-26: 20 mg via ORAL
  Filled 2016-10-26: qty 1

## 2016-10-26 MED ORDER — PROMETHAZINE HCL 25 MG/ML IJ SOLN
6.2500 mg | Freq: Four times a day (QID) | INTRAMUSCULAR | Status: DC | PRN
  Administered 2016-10-26: 6.25 mg via INTRAVENOUS
  Filled 2016-10-26: qty 1

## 2016-10-26 MED ORDER — ONDANSETRON HCL 4 MG/2ML IJ SOLN
INTRAMUSCULAR | Status: AC
  Filled 2016-10-26: qty 2

## 2016-10-26 SURGICAL SUPPLY — 74 items
APPLIER CLIP ROT 10 11.4 M/L (STAPLE)
BLADE SURG 10 STRL SS SAFETY (BLADE) ×2 IMPLANT
BLADE SURG 11 STRL SS SAFETY (MISCELLANEOUS) ×2 IMPLANT
CANISTER SUCT 1200ML W/VALVE (MISCELLANEOUS) ×2 IMPLANT
CANNULA DILATOR 10 W/SLV (CANNULA) ×2 IMPLANT
CATH TRAY 16F METER LATEX (MISCELLANEOUS) ×2 IMPLANT
CHLORAPREP W/TINT 26ML (MISCELLANEOUS) ×2 IMPLANT
CLIP APPLIE ROT 10 11.4 M/L (STAPLE) IMPLANT
COVER CLAMP SIL LG PBX B (MISCELLANEOUS) IMPLANT
DEVICE HAND ACCESS DEXTUS (MISCELLANEOUS) IMPLANT
DRAPE LAP W/FLUID (DRAPES) IMPLANT
DRAPE UNDER BUTTOCK W/FLU (DRAPES) IMPLANT
DRSG OPSITE POSTOP 4X10 (GAUZE/BANDAGES/DRESSINGS) ×2 IMPLANT
DRSG OPSITE POSTOP 4X8 (GAUZE/BANDAGES/DRESSINGS) ×2 IMPLANT
DRSG TEGADERM 2-3/8X2-3/4 SM (GAUZE/BANDAGES/DRESSINGS) ×6 IMPLANT
DRSG TEGADERM 4X4.75 (GAUZE/BANDAGES/DRESSINGS) ×2 IMPLANT
DRSG TELFA 3X8 NADH (GAUZE/BANDAGES/DRESSINGS) ×2 IMPLANT
ELECT BLADE 6.5 EXT (BLADE) ×2 IMPLANT
ELECT CAUTERY BLADE 6.4 (BLADE) ×2 IMPLANT
ELECT REM PT RETURN 9FT ADLT (ELECTROSURGICAL) ×2
ELECTRODE REM PT RTRN 9FT ADLT (ELECTROSURGICAL) ×1 IMPLANT
FILTER LAP SMOKE EVAC STRL (MISCELLANEOUS) ×2 IMPLANT
GLOVE BIO SURGEON STRL SZ7 (GLOVE) ×6 IMPLANT
GLOVE BIO SURGEON STRL SZ7.5 (GLOVE) ×6 IMPLANT
GLOVE INDICATOR 8.0 STRL GRN (GLOVE) ×4 IMPLANT
GOWN STRL REUS W/ TWL LRG LVL3 (GOWN DISPOSABLE) ×6 IMPLANT
GOWN STRL REUS W/TWL LRG LVL3 (GOWN DISPOSABLE) ×6
HANDLE YANKAUER SUCT BULB TIP (MISCELLANEOUS) ×2 IMPLANT
HOLDER FOLEY CATH W/STRAP (MISCELLANEOUS) ×2 IMPLANT
IRRIGATION STRYKERFLOW (MISCELLANEOUS) IMPLANT
IRRIGATOR STRYKERFLOW (MISCELLANEOUS)
IV LACTATED RINGERS 1000ML (IV SOLUTION) ×2 IMPLANT
KIT PINK PAD W/HEAD ARE REST (MISCELLANEOUS) ×2
KIT PINK PAD W/HEAD ARM REST (MISCELLANEOUS) ×1 IMPLANT
KIT RM TURNOVER STRD PROC AR (KITS) ×2 IMPLANT
LABEL OR SOLS (LABEL) ×2 IMPLANT
NDL INSUFF ACCESS 14 VERSASTEP (NEEDLE) ×2 IMPLANT
NS IRRIG 500ML POUR BTL (IV SOLUTION) ×2 IMPLANT
PACK COLON CLEAN CLOSURE (MISCELLANEOUS) ×2 IMPLANT
PACK LAP CHOLECYSTECTOMY (MISCELLANEOUS) ×2 IMPLANT
PAD DRESSING TELFA 3X8 NADH (GAUZE/BANDAGES/DRESSINGS) ×1 IMPLANT
PAD PREP 24X41 OB/GYN DISP (PERSONAL CARE ITEMS) ×2 IMPLANT
PENCIL ELECTRO HAND CTR (MISCELLANEOUS) ×2 IMPLANT
PROT DEXTUS HAND ACCESS (MISCELLANEOUS)
RELOAD PROXIMATE 75MM BLUE (ENDOMECHANICALS) ×2 IMPLANT
RELOAD STAPLE 75 3.8 BLU REG (ENDOMECHANICALS) ×1 IMPLANT
RETAINER VISCERA MED (MISCELLANEOUS) IMPLANT
RETRACTOR FIXED LENGTH SML (MISCELLANEOUS) IMPLANT
RETRACTOR WOUND ALXS 18CM MED (MISCELLANEOUS) ×1 IMPLANT
RTRCTR WOUND ALEXIS O 18CM MED (MISCELLANEOUS) ×2
SCISSORS METZENBAUM CVD 33 (INSTRUMENTS) ×2 IMPLANT
SEAL FOR SCOPE WARMER C3101 (MISCELLANEOUS) ×2 IMPLANT
SET YANKAUER POOLE SUCT (MISCELLANEOUS) ×2 IMPLANT
SHEARS HARMONIC ACE PLUS 36CM (ENDOMECHANICALS) ×2 IMPLANT
SPONGE LAP 18X18 5 PK (GAUZE/BANDAGES/DRESSINGS) ×8 IMPLANT
STAPLER PROXIMATE 75MM BLUE (STAPLE) ×2 IMPLANT
STRIP CLOSURE SKIN 1/2X4 (GAUZE/BANDAGES/DRESSINGS) ×2 IMPLANT
SUT PROLENE 0 CT 1 30 (SUTURE) ×8 IMPLANT
SUT SILK 2 0 (SUTURE) ×1
SUT SILK 2-0 30XBRD TIE 12 (SUTURE) ×1 IMPLANT
SUT SILK 3-0 (SUTURE) ×4 IMPLANT
SUT VIC AB 2-0 BRD 54 (SUTURE) ×4 IMPLANT
SUT VIC AB 2-0 CT1 27 (SUTURE) ×2
SUT VIC AB 2-0 CT1 TAPERPNT 27 (SUTURE) ×2 IMPLANT
SUT VIC AB 3-0 54X BRD REEL (SUTURE) ×2 IMPLANT
SUT VIC AB 3-0 BRD 54 (SUTURE) ×2
SUT VIC AB 3-0 SH 27 (SUTURE) ×2
SUT VIC AB 3-0 SH 27X BRD (SUTURE) ×2 IMPLANT
SUT VIC AB 4-0 FS2 27 (SUTURE) ×4 IMPLANT
TROCAR XCEL BLUNT TIP 100MML (ENDOMECHANICALS) ×2 IMPLANT
TROCAR XCEL NON-BLD 11X100MML (ENDOMECHANICALS) ×2 IMPLANT
TROCAR XCEL UNIV SLVE 11M 100M (ENDOMECHANICALS) ×4 IMPLANT
TUBING INSUF HEATED (TUBING) ×2 IMPLANT
WATER STERILE IRR 1000ML POUR (IV SOLUTION) ×2 IMPLANT

## 2016-10-26 NOTE — Progress Notes (Signed)
Patient up to the chair for 45 minutes

## 2016-10-26 NOTE — Transfer of Care (Signed)
Immediate Anesthesia Transfer of Care Note  Patient: Kathleen James  Procedure(s) Performed: Procedure(s): LAPAROSCOPIC RIGHT COLECTOMY (Right)  Patient Location: PACU  Anesthesia Type:General  Level of Consciousness: sedated  Airway & Oxygen Therapy: Patient Spontanous Breathing and Patient connected to face mask oxygen  Post-op Assessment: Report given to RN and Post -op Vital signs reviewed and stable  Post vital signs: Reviewed and stable  Last Vitals:  Vitals:   10/26/16 0824 10/26/16 1115  BP:  120/61  Pulse:  98  Resp:  14  Temp: 36.8 C 36.7 C    Last Pain:  Vitals:   10/26/16 0824  TempSrc: Tympanic         Complications: No apparent anesthesia complications

## 2016-10-26 NOTE — Anesthesia Procedure Notes (Addendum)
Procedure Name: Intubation Date/Time: 10/26/2016 8:41 AM Performed by: Doreen Salvage Pre-anesthesia Checklist: Patient identified, Patient being monitored, Timeout performed, Emergency Drugs available and Suction available Patient Re-evaluated:Patient Re-evaluated prior to inductionOxygen Delivery Method: Circle system utilized Preoxygenation: Pre-oxygenation with 100% oxygen Intubation Type: IV induction Ventilation: Mask ventilation without difficulty Laryngoscope Size: Mac, 3, Miller and 2 Grade View: Grade II Tube type: Oral Tube size: 7.0 mm Number of attempts: 2 Airway Equipment and Method: Stylet Placement Confirmation: ETT inserted through vocal cords under direct vision,  positive ETCO2 and breath sounds checked- equal and bilateral Secured at: 20 cm Tube secured with: Tape Dental Injury: Teeth and Oropharynx as per pre-operative assessment  Difficulty Due To: Difficult Airway- due to limited oral opening and Difficult Airway- due to reduced neck mobility

## 2016-10-26 NOTE — Op Note (Signed)
Preoperative diagnosis: Near obstructing tumor of the proximal cecum.  Postoperative diagnosis: Same.  Operative procedure: Laparoscopically assisted right hemicolectomy with ileotransverse colostomy.  Operating surgeon: Ollen Bowl, M.D.  Asst.: Arvilla Meres, RNFA  Anesthesia: Gen. endotracheal, Marcaine 0.5% with 1-200,000 epinephrine, 30 mL.  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 She is undergone chemotherapy with improvement in her liver lesions but slow progression in the size of the cecal lesion suggesting the potential for obstruction of the ileocecal valve. She was felt to be a candidate for right hemicolectomy.  The patient received Invanz prior to procedure. Entereg was administered prior to surgery. SCD stockings for DVT prevention. ChloraPrep skin cleansing prior to the surgery.  Operative note: With the patient under adequate general endotracheal anesthesia a Foley catheter was placed by the nurse. The abdomen was prepped with ChloraPrep and draped. She had a lower midline incision for that reason was elected to place a Hassan cannula a just above the umbilicus. The incision was made in the skin divided sharply. The remaining dissection was completed with electrocautery. The fascia was divided with cutting current. The peritoneum was grasped and entered. There was a single adhesion a below the incision and this was divided with Harmonic scalpel later in the procedure. A 0 Prolene suture was placed in a Hassan cannula anchored in the position. Pneumoperitoneum was established. No adhesions except the one at the umbilicus. An 11 mm XL port was placed in the epigastrium and a second port placed in the hypogastric area. The abdomen was free of gross metastatic disease. Several small nodules along both the right and left lobe of the liver. These right colon was mobilized by dividing the  white line of Toldt with the Harmonic scalpel. The omentum was freed from the transverse colon and the hepatic flexure rolled medially and inferiorly. The duodenum was visualized and protected. After the colon could be brought to the left side of the abdomen the abdomen was desufflated. An 8 cm incision was made above the umbilicus. Hemostasis was with electrocautery. The right colon was brought out through the wound having placed an Kent wound protector. A side-to-side functional end and anastomosis was completed after dividing mesenteric vessels with the right colic and right branch of the middle colic vessels controlled with 2-0 silk ties. Remaining vascular controls with a Harmonic scalpel. The distal ileum and the mid transverse colon were placed side-to-side with interrupted 3-0 silk sutures. Enterotomies were made and the GIA stapler fired. There were several bleeding points along the inferior aspect and required cautery and 3-0 Vicryl figure-of-eight sutures for hemostasis. After this was established a 90 application of the GIA 75 mm stapler completed the anastomosis. This palpated to 3 fingerbreadths. The mesentery of the transverse colon was anchored to the small bowel mesentery with a running 3-0 Vicryl suture. The abdomen was irrigated with 500 mL of warm saline. Good hemostasis was noted. Gowns close instruments and dressings were changed. The skin of the surgical sites was cleansed with Betadine for later instillation of Marcaine. The peritoneum was closed with a running 2-0 Vicryl suture. The fascia was closed with interrupted 0 Prolene figure-of-eight sutures. The adipose layer was closed with a running 2-0 Vicryl suture and the skin closed with a running 4-0 Vicryl septic suture. A honeycomb dressing was applied to the extraction site and Telfa and Tegaderm applied to the port sites.  The patient tolerated the procedure well  and was taken to recovery room in stable condition.

## 2016-10-26 NOTE — Progress Notes (Signed)
Blood pressure 97/60  Dr piscitello called  Blood pressure lower then pre op   No new orders

## 2016-10-26 NOTE — H&P (Signed)
No change in clinical history or exam.  

## 2016-10-26 NOTE — Anesthesia Post-op Follow-up Note (Cosign Needed)
Anesthesia QCDR form completed.        

## 2016-10-26 NOTE — Anesthesia Preprocedure Evaluation (Addendum)
Anesthesia Evaluation  Patient identified by MRN, date of birth, ID band Patient awake    Reviewed: Allergy & Precautions, H&P , NPO status , Patient's Chart, lab work & pertinent test results  History of Anesthesia Complications Negative for: history of anesthetic complications  Airway Mallampati: III  TM Distance: >3 FB Neck ROM: limited    Dental  (+) Chipped, Caps   Pulmonary neg pulmonary ROS, neg shortness of breath,    Pulmonary exam normal breath sounds clear to auscultation       Cardiovascular Exercise Tolerance: Good hypertension, (-) angina(-) Past MI and (-) DOE Normal cardiovascular exam Rhythm:regular Rate:Normal     Neuro/Psych PSYCHIATRIC DISORDERS Anxiety negative neurological ROS     GI/Hepatic negative GI ROS, Neg liver ROS, neg GERD  ,  Endo/Other  negative endocrine ROS  Renal/GU      Musculoskeletal  (+) Arthritis ,   Abdominal   Peds  Hematology negative hematology ROS (+)   Anesthesia Other Findings Signs and symptoms suggestive of sleep apnea   Patient reports no problems with oxycodone or fentanyl.  Patient reports that she has not been told to not take acetaminophen   Past Medical History: No date: Angiolipoma of kidney     Comment: followed with serial CTs ,,  Dr. Jacqlyn Larsen No date: Anxiety No date: Arthritis No date: Cancer [facility])     Comment: colon No date: Chemotherapy induced diarrhea No date: Hammer toe No date: Hypertension No date: Liver cancer ([place]) No date: Neuro-endocrine carcinoma ([facility]) No date: Obesity (BMI 30-39.9)  Past Surgical History: 1995: ABDOMINAL HYSTERECTOMY     Comment: endometriosis, heavy bleeding No date: BUNIONECTOMY Bilateral 1985: CESAREAN SECTION 10/09/2015: COLONOSCOPY WITH PROPOFOL N/A     Comment: Procedure: COLONOSCOPY WITH PROPOFOL;                Surgeon: Robert Bellow, MD;  Location: [facility]              ENDOSCOPY;  Service: Endoscopy;   Laterality:               N/A; 04/07/2016: IR GENERIC HISTORICAL     Comment: IR RADIOLOGIST EVAL & MGMT 04/07/2016 Arne Cleveland, MD [facility] INTERV RAD No date: OOPHORECTOMY 10/01/2015: PORTACATH PLACEMENT Left     Comment: Procedure: INSERTION PORT-A-CATH;  Surgeon:               Robert Bellow, MD;  Location: [facility] ORS;                Service: General;  Laterality: Left; 2008: ROTATOR CUFF REPAIR Right     Comment: right shoulder.  [place]   BMI    Body Mass Index:  32.96 kg/m      Reproductive/Obstetrics negative OB ROS                            Anesthesia Physical Anesthesia Plan  ASA: III  Anesthesia Plan: General ETT   Post-op Pain Management:    Induction: Intravenous  Airway Management Planned: Oral ETT  Additional Equipment:   Intra-op Plan:   Post-operative Plan: Extubation in OR  Informed Consent: I have reviewed the patients History and Physical, chart, labs and discussed the procedure including the risks, benefits and alternatives for the proposed anesthesia with the patient or authorized representative who has indicated his/her understanding and acceptance.  Dental Advisory Given  Plan Discussed with: Anesthesiologist, CRNA and Surgeon  Anesthesia Plan Comments: (Patient consented for risks of anesthesia including but not limited to:  - adverse reactions to medications - damage to teeth, lips or other oral mucosa - sore throat or hoarseness - Damage to heart, brain, lungs or loss of life  Patient voiced understanding.)        Anesthesia Quick Evaluation

## 2016-10-26 NOTE — Anesthesia Postprocedure Evaluation (Signed)
Anesthesia Post Note  Patient: Kathleen James  Procedure(s) Performed: Procedure(s) (LRB): LAPAROSCOPIC RIGHT COLECTOMY (Right)  Patient location during evaluation: PACU Anesthesia Type: General Level of consciousness: awake and alert Pain management: pain level controlled Vital Signs Assessment: post-procedure vital signs reviewed and stable Respiratory status: spontaneous breathing, nonlabored ventilation, respiratory function stable and patient connected to nasal cannula oxygen Cardiovascular status: blood pressure returned to baseline and stable Postop Assessment: no signs of nausea or vomiting Anesthetic complications: no     Last Vitals:  Vitals:   10/26/16 1156 10/26/16 1211  BP: (!) 99/59 107/62  Pulse: 84 78  Resp: 16 (!) 9  Temp:      Last Pain:  Vitals:   10/26/16 1211  TempSrc:   PainSc: 2                  Precious Haws Piscitello

## 2016-10-27 MED ORDER — SODIUM CHLORIDE 0.9% FLUSH: 10.0000 mL | INTRAVENOUS | Status: DC | PRN

## 2016-10-27 MED ORDER — LIDOCAINE-PRILOCAINE 2.5-2.5 % EX CREA
TOPICAL_CREAM | CUTANEOUS | Status: DC | PRN
  Filled 2016-10-27 (×3): qty 5

## 2016-10-27 MED ORDER — ACETAMINOPHEN 325 MG PO TABS
650.0000 mg | ORAL_TABLET | ORAL | Status: DC | PRN
  Administered 2016-10-27 – 2016-10-29 (×5): 650 mg via ORAL
  Filled 2016-10-27 (×2): qty 2
  Filled 2016-10-27: qty 1
  Filled 2016-10-27 (×3): qty 2

## 2016-10-27 MED ORDER — SODIUM CHLORIDE 0.9% FLUSH
10.0000 mL | Freq: Two times a day (BID) | INTRAVENOUS | Status: DC
  Administered 2016-10-27 – 2016-10-28 (×2): 10 mL
  Administered 2016-10-28: 20 mL

## 2016-10-27 NOTE — Plan of Care (Signed)
Problem: Activity: Goal: Ability to return to normal activity level will improve Outcome: Progressing Patient has ambulated in hallway multiple times and tolerated well

## 2016-10-27 NOTE — Progress Notes (Signed)
Afebrile. BP trending to baseline. Nausea finally passed about 10 PM last night. Lungs: Clear. Cardio: RR. ABD: Soft. Dressings: Dry. Extrem: Soft. Has yet to ambulate. U/O fine. Will d/c foley today.

## 2016-10-28 MED ORDER — IRBESARTAN 150 MG PO TABS
300.0000 mg | ORAL_TABLET | Freq: Every day | ORAL | Status: DC
  Administered 2016-10-28 – 2016-10-29 (×2): 300 mg via ORAL
  Filled 2016-10-28 (×2): qty 2

## 2016-10-28 NOTE — Progress Notes (Signed)
Patient ID: Kathleen James, female   DOB: Jan 27, 1951, 66 y.o.   MRN: [id number] No complaints except for some soreness in abdomen. No n/v. No BM or flatus yet. Has been up walking. Abdomen is soft good bowel sounds, incision is clean and intact. VSS Overall stable. Advance to full liquids.

## 2016-10-29 NOTE — Progress Notes (Signed)
Pt to be discharged per MD order. Implanted port de accessed. Instructions reviewed with pt and family. All questions answered. No scripts for pt. Will discharge in wheelchair.

## 2016-10-29 NOTE — Final Progress Note (Signed)
AVSS. Tolerating diet well. Ambulating well. Lungs: Clear. Cardio: RR. ABD: Soft. Wounds: Clean. Extrem: Soft. Reviewed path w/ patient and husband. F/U next week.

## 2016-10-29 NOTE — Care Management Important Message (Signed)
Important Message  Patient Details  Name: Kathleen James MRN: [id number] Date of Birth: December 19, 1950   Medicare Important Message Given:  Yes    Beverly Sessions, RN 10/29/2016, 10:03 AM

## 2016-11-01 ENCOUNTER — Other Ambulatory Visit: Payer: Self-pay | Admitting: General Surgery

## 2016-11-03 ENCOUNTER — Other Ambulatory Visit: Payer: Self-pay | Admitting: Anatomic Pathology & Clinical Pathology

## 2016-11-04 ENCOUNTER — Other Ambulatory Visit: Payer: Self-pay | Admitting: General Surgery

## 2016-11-04 ENCOUNTER — Ambulatory Visit (INDEPENDENT_AMBULATORY_CARE_PROVIDER_SITE_OTHER): Payer: Medicare HMO | Admitting: General Surgery

## 2016-11-04 ENCOUNTER — Encounter: Payer: Self-pay | Admitting: General Surgery

## 2016-11-04 VITALS — BP 138/84 | HR 78 | Resp 12 | Ht 65.0 in | Wt 195.0 lb

## 2016-11-04 DIAGNOSIS — C182 Malignant neoplasm of ascending colon: Secondary | ICD-10-CM

## 2016-11-04 NOTE — Progress Notes (Signed)
Patient ID: Kathleen James, female   DOB: [birth year], 66 y.o.   MRN: [id number]  Chief Complaint  Patient presents with  . Routine Post Op    HPI Kathleen James is a 66 y.o. female.  Here today for postoperative visit, right colectomy on 10-26-16, she states she is doing well. Denies any gastrointestinal issues, bowels are moving regular. She does notice a "pulling" sensation at the incision when she stand up. Bowels move 3-4 times in the morning which was her pre surgery routine. She is here with her daughter, Janit Cutter.  HPI  Past Medical History:  Diagnosis Date  . Angiolipoma of kidney    followed with serial CTs ,,  Dr. Jacqlyn Larsen  . Anxiety   . Arthritis   . 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 Chemotherapy induced diarrhea   . Hammer toe   . Hypertension   . Liver cancer ([place])   . Neuro-endocrine carcinoma ([place])   . Obesity (BMI 30-39.9)     Past Surgical History:  Procedure Laterality Date  . ABDOMINAL HYSTERECTOMY  1995   endometriosis, heavy bleeding  . BUNIONECTOMY Bilateral   . CESAREAN SECTION  1985  . COLONOSCOPY WITH PROPOFOL N/A 10/09/2015   Procedure: COLONOSCOPY WITH PROPOFOL;  Surgeon: Robert Bellow, MD;  Location[facility] ENDOSCOPY;  Service: Endoscopy;  Laterality: N/A;  . IR GENERIC HISTORICAL  04/07/2016   IR RADIOLOGIST EVAL & MGMT 04/07/2016 Arne Cleveland, MD [facility] INTERV RAD  . LAPAROSCOPIC RIGHT COLECTOMY Right 10/26/2016   Procedure: LAPAROSCOPIC RIGHT COLECTOMY;  Surgeon: Robert Bellow, MD;  Location: [facility] ORS;  Service: General;  Laterality: Right;  . OOPHORECTOMY    . PORTACATH PLACEMENT Left 10/01/2015   Procedure: INSERTION PORT-A-CATH;  Surgeon: Robert Bellow, MD;  Location: [facility] ORS;  Service: General;  Laterality: Left;  . ROTATOR CUFF REPAIR Right 2008   right shoulder.  [place]     Family History  Problem Relation Age of Onset  . Mental illness Mother 60        bipolar, dementia  . Cancer Maternal Aunt 70       breast ca  . Hypertension Father   . Stroke Father 27       deceased  . COPD Father   . Heart disease Sister        deceased    Social History Social History  Substance Use Topics  . Smoking status: Never Smoker  . Smokeless tobacco: Never Used  . Alcohol use 0.0 oz/week     Comment: occasionally    Allergies  Allergen Reactions  . Bee Venom   . Morphine And Related Nausea Only    Current Outpatient Prescriptions  Medication Sig Dispense Refill  . ALPRAZolam (XANAX) 0.25 MG tablet TAKE 1 TABLET TWICE DAILY AS NEEDED FOR ANXIETY OR SLEEP 60 tablet 1  . amLODipine (NORVASC) 5 MG tablet TAKE ONE TABLET BY MOUTH EVERY DAY 30 tablet 3  . Biotin w/ Vitamins C & E (HAIR/SKIN/NAILS PO) Take 1 tablet by mouth daily.    . Chlorpheniramine Maleate (CHLOR-TABLETS PO) Take 1 tablet by mouth every evening.    . diphenoxylate-atropine (LOMOTIL) 2.5-0.025 MG tablet TAKE 1 TABLET FOUR TIMES DAILY AS NEEDEDFOR DIARRHEA OR LOOSE STOOLS 30 tablet 3  . lidocaine-prilocaine (EMLA) cream Apply 1 application topically as needed. Apply to port a cath site 1 hour prior to chemotherapy treatments 30 g 6  . loperamide (  IMODIUM A-D) 2 MG tablet Take 2 mg by mouth 4 (four) times daily as needed for diarrhea or loose stools.    . naproxen sodium (ANAPROX) 220 MG tablet Take 440 mg by mouth 2 (two) times daily as needed.    Marland Kitchen olmesartan (BENICAR) 40 MG tablet Take 1 tablet (40 mg total) by mouth daily. 30 tablet 2  . ondansetron (ZOFRAN) 8 MG tablet Take 1 tablet (8 mg total) by mouth every 6 (six) hours as needed for nausea or vomiting. 30 tablet 3  . potassium chloride (K-DUR) 10 MEQ tablet TAKE ONE TABLET BY MOUTH TWICE DAILY 60 tablet 4  . prochlorperazine (COMPAZINE) 10 MG tablet Take 1 tablet (10 mg total) by mouth every 6 (six) hours as needed for nausea or vomiting. 30 tablet 6   No current facility-administered medications for this visit.     Facility-Administered Medications Ordered in Other Visits  Medication Dose Route Frequency Provider Last Rate Last Dose  . heparin lock flush 100 unit/mL  500 Units Intravenous Once Charlaine Dalton R, MD      . sodium chloride flush (NS) 0.9 % injection 10 mL  10 mL Intravenous PRN [name], MD   10 mL at 11/24/15 6237    Review of Systems Review of Systems  Constitutional: Negative.   Respiratory: Negative.   Cardiovascular: Negative.     Blood pressure 138/84, pulse 78, resp. rate 12, height '5\' 5"'  (1.651 m), weight 195 lb (88.5 kg).  Physical Exam Physical Exam  Constitutional: She is oriented to person, place, and time. She appears well-developed and well-nourished.  HENT:  Mouth/Throat: Oropharynx is clear and moist.  Eyes: Conjunctivae are normal. No scleral icterus.  Neck: Neck supple.  Cardiovascular: Normal rate, regular rhythm and normal heart sounds.   Pulmonary/Chest: Effort normal and breath sounds normal.  Abdominal: Soft. Normal appearance and bowel sounds are normal. There is no tenderness. No hernia.    incision healing well, mild irritation   Lymphadenopathy:    She has no cervical adenopathy.  Neurological: She is alert and oriented to person, place, and time.  Skin: Skin is warm and dry.  Psychiatric: Her behavior is normal.    Data Reviewed  CEA obtained the day of the patient's visit had fallen from 301-255 in 10 days.  Assessment     Doing well status post right colon resection for mixed tumor neuroendocrine/Signet cell adenocarcinoma.    Plan    Patient will follow-upon 11/08/2016 with the medical oncology service Likelihood of additional chemotherapy or consideration of embolization of her liver tumor will be discussed with her at that time.   Should there be a consideration to reinitiate Avastin therapy this should beheld for 30 days after surgery.    CBC, Met C and CEA today Follow up in one month.  HPI, Physical Exam,  Assessment and Plan have been scribed under the direction and in the presence of Robert Bellow, MD.  Karie Fetch, RN  I have completed the exam and reviewed the above documentation for accuracy and completeness.  I agree with the above.  Haematologist has been used and any errors in dictation or transcription are unintentional.  Hervey Ard, M.D., F.A.C.S.  Robert Bellow 11/06/2016, 8:28 AM

## 2016-11-04 NOTE — Patient Instructions (Signed)
The patient is aware to call back for any questions or concerns.  

## 2016-11-04 NOTE — Discharge Summary (Signed)
Physician Discharge Summary  Patient ID: Kathleen James MRN: [id number] DOB/AGE: [birth year] 66 y.o.  Admit date: 10/26/2016 Discharge date: 11/04/2016  Admission Diagnoses: Colon cancer  Discharge Diagnoses:  Active Problems:   Cancer of ascending colon [facility])   Discharged Condition: good  Hospital Course: Patient was admitted status post laparoscopically assisted right hemicolectomy for an enlarging mass near the ileocecal valve thought to present risk for obstruction. She was started on clear liquids the day of surgery and ambulated early and often. Her postoperative course was unremarkable. She was advanced to a full liquid then soft diet which she tolerated well. Spontaneous bowel movement postoperative day 2. Scant need for narcotic analgesics.  Consults: None  Significant Diagnostic Studies: labs: Pathology showed a mixed tumor with neuroendocrine and segments L features. 18/22 nodes positive.  Treatments: IV hydration  Discharge Exam: Blood pressure 126/81, pulse 85, temperature 98.1 F (36.7 C), temperature source Oral, resp. rate 18, height 5\' 4"  (1.626 m), weight 192 lb (87.1 kg), SpO2 100 %. General appearance: alert Resp: clear to auscultation bilaterally Cardio: regular rate and rhythm, S1, S2 normal, no murmur, click, rub or gallop GI: soft, non-tender; bowel sounds normal; no masses,  no organomegaly Incision/Wound: Wound clean without evidence of infection or induration. Extremities: No evidence of DVT.  Disposition: 01-Home or Self Care  Discharge Instructions    Diet - low sodium heart healthy    Complete by:  As directed    Discharge instructions    Complete by:  As directed    No driving until pain free.  Avoid lifting over 10 pounds.  Diet as tolerated.  Resume regular home medications including Benicar and Norvasc.  Use incentive spirometer frequently at home.  Walk frequently to regain your strength.   Increase activity slowly    Complete by:   As directed      Allergies as of 10/29/2016      Reactions   Bee Venom    Morphine And Related Nausea Only      Medication List    STOP taking these medications   neomycin 500 MG tablet Commonly known as:  MYCIFRADIN   polyethylene glycol powder powder Commonly known as:  GLYCOLAX/MIRALAX     TAKE these medications   ALPRAZolam 0.25 MG tablet Commonly known as:  XANAX TAKE 1 TABLET TWICE DAILY AS NEEDED FOR ANXIETY OR SLEEP   amLODipine 5 MG tablet Commonly known as:  NORVASC TAKE ONE TABLET BY MOUTH EVERY DAY   CHLOR-TABLETS PO Take 1 tablet by mouth every evening.   diphenoxylate-atropine 2.5-0.025 MG tablet Commonly known as:  LOMOTIL TAKE 1 TABLET FOUR TIMES DAILY AS NEEDEDFOR DIARRHEA OR LOOSE STOOLS   HAIR/SKIN/NAILS PO Take 1 tablet by mouth daily.   IMODIUM A-D 2 MG tablet Generic drug:  loperamide Take 2 mg by mouth 4 (four) times daily as needed for diarrhea or loose stools.   lidocaine-prilocaine cream Commonly known as:  EMLA Apply 1 application topically as needed. Apply to port a cath site 1 hour prior to chemotherapy treatments   naproxen sodium 220 MG tablet Commonly known as:  ANAPROX Take 440 mg by mouth 2 (two) times daily as needed.   olmesartan 40 MG tablet Commonly known as:  BENICAR Take 1 tablet (40 mg total) by mouth daily.   ondansetron 8 MG tablet Commonly known as:  ZOFRAN Take 1 tablet (8 mg total) by mouth every 6 (six) hours as needed for nausea or vomiting.   potassium chloride 10  MEQ tablet Commonly known as:  K-DUR TAKE ONE TABLET BY MOUTH TWICE DAILY   prochlorperazine 10 MG tablet Commonly known as:  COMPAZINE Take 1 tablet (10 mg total) by mouth every 6 (six) hours as needed for nausea or vomiting.      Follow-up Information    [name], Forest Gleason, MD. Go on 11/04/2016.   Specialties:  General Surgery, Radiology Why:  Thursday at 9:45am for hospital follow-up Contact information: [address] [place] [address] [phone]           Signed: Robert Bellow 11/04/2016, 8:40 AM

## 2016-11-05 LAB — COMPREHENSIVE METABOLIC PANEL
A/G RATIO: 1.1 — AB (ref 1.2–2.2)
ALBUMIN: 4.2 g/dL (ref 3.6–4.8)
ALK PHOS: 98 IU/L (ref 39–117)
ALT: 25 IU/L (ref 0–32)
AST: 26 IU/L (ref 0–40)
BILIRUBIN TOTAL: 0.3 mg/dL (ref 0.0–1.2)
BUN / CREAT RATIO: 14 (ref 12–28)
BUN: 14 mg/dL (ref 8–27)
CHLORIDE: 101 mmol/L (ref 96–106)
CO2: 22 mmol/L (ref 18–29)
Calcium: 9.8 mg/dL (ref 8.7–10.3)
Creatinine, Ser: 0.98 mg/dL (ref 0.57–1.00)
GFR calc Af Amer: 70 mL/min/{1.73_m2} (ref >59)
GFR calc non Af Amer: 60 mL/min/{1.73_m2} (ref >59)
GLOBULIN, TOTAL: 3.7 g/dL (ref 1.5–4.5)
GLUCOSE: 86 mg/dL (ref 65–99)
POTASSIUM: 4.6 mmol/L (ref 3.5–5.2)
SODIUM: 138 mmol/L (ref 134–144)
Total Protein: 7.9 g/dL (ref 6.0–8.5)

## 2016-11-05 LAB — CBC WITH DIFFERENTIAL/PLATELET
BASOS ABS: 0.1 10*3/uL (ref 0.0–0.2)
Basos: 1 %
EOS (ABSOLUTE): 0.2 10*3/uL (ref 0.0–0.4)
Eos: 3 %
HEMATOCRIT: 38 % (ref 34.0–46.6)
Hemoglobin: 12.5 g/dL (ref 11.1–15.9)
Immature Grans (Abs): 0 10*3/uL (ref 0.0–0.1)
Immature Granulocytes: 0 %
LYMPHS ABS: 1.6 10*3/uL (ref 0.7–3.1)
Lymphs: 26 %
MCH: 34.2 pg — AB (ref 26.6–33.0)
MCHC: 32.9 g/dL (ref 31.5–35.7)
MCV: 104 fL — AB (ref 79–97)
Monocytes Absolute: 0.4 10*3/uL (ref 0.1–0.9)
Monocytes: 6 %
NEUTROS ABS: 4 10*3/uL (ref 1.4–7.0)
Neutrophils: 64 %
Platelets: 386 10*3/uL — ABNORMAL HIGH (ref 150–379)
RBC: 3.66 x10E6/uL — ABNORMAL LOW (ref 3.77–5.28)
RDW: 17.2 % — ABNORMAL HIGH (ref 12.3–15.4)
WBC: 6.3 10*3/uL (ref 3.4–10.8)

## 2016-11-05 LAB — SURGICAL PATHOLOGY

## 2016-11-05 LAB — CEA: CEA: 254.9 ng/mL — ABNORMAL HIGH (ref 0.0–4.7)

## 2016-11-08 ENCOUNTER — Other Ambulatory Visit: Payer: Self-pay | Admitting: Internal Medicine

## 2016-11-08 ENCOUNTER — Inpatient Hospital Stay (HOSPITAL_BASED_OUTPATIENT_CLINIC_OR_DEPARTMENT_OTHER): Payer: Medicare HMO | Admitting: Internal Medicine

## 2016-11-08 ENCOUNTER — Encounter: Payer: Self-pay | Admitting: Internal Medicine

## 2016-11-08 ENCOUNTER — Inpatient Hospital Stay: Payer: Medicare HMO

## 2016-11-08 VITALS — BP 125/85 | HR 93 | Temp 97.0°F | Ht 64.0 in | Wt 197.1 lb

## 2016-11-08 DIAGNOSIS — M129 Arthropathy, unspecified: Secondary | ICD-10-CM

## 2016-11-08 DIAGNOSIS — D1771 Benign lipomatous neoplasm of kidney: Secondary | ICD-10-CM

## 2016-11-08 DIAGNOSIS — C189 Malignant neoplasm of colon, unspecified: Secondary | ICD-10-CM

## 2016-11-08 DIAGNOSIS — R69 Illness, unspecified: Secondary | ICD-10-CM | POA: Diagnosis not present

## 2016-11-08 DIAGNOSIS — F419 Anxiety disorder, unspecified: Secondary | ICD-10-CM | POA: Diagnosis not present

## 2016-11-08 DIAGNOSIS — C7A1 Malignant poorly differentiated neuroendocrine tumors: Secondary | ICD-10-CM

## 2016-11-08 DIAGNOSIS — Z803 Family history of malignant neoplasm of breast: Secondary | ICD-10-CM | POA: Diagnosis not present

## 2016-11-08 DIAGNOSIS — Z5111 Encounter for antineoplastic chemotherapy: Secondary | ICD-10-CM | POA: Diagnosis not present

## 2016-11-08 DIAGNOSIS — E669 Obesity, unspecified: Secondary | ICD-10-CM | POA: Diagnosis not present

## 2016-11-08 DIAGNOSIS — C182 Malignant neoplasm of ascending colon: Secondary | ICD-10-CM

## 2016-11-08 DIAGNOSIS — C787 Secondary malignant neoplasm of liver and intrahepatic bile duct: Principal | ICD-10-CM

## 2016-11-08 DIAGNOSIS — C7B8 Other secondary neuroendocrine tumors: Secondary | ICD-10-CM | POA: Diagnosis not present

## 2016-11-08 DIAGNOSIS — Z79899 Other long term (current) drug therapy: Secondary | ICD-10-CM | POA: Diagnosis not present

## 2016-11-08 DIAGNOSIS — I10 Essential (primary) hypertension: Secondary | ICD-10-CM | POA: Diagnosis not present

## 2016-11-08 NOTE — Progress Notes (Signed)
Patient here for follow up no change since last appointment.

## 2016-11-08 NOTE — Progress Notes (Signed)
[name] NOTE  [name], [name], MD (Internal Medicine) Bary Castilla, Forest Gleason, [name], RN as Registered Nurse [name], MD as Consulting Physician (Internal Medicine)  CHIEF COMPLAINTS/PURPOSE OF CONSULTATION:   Oncology History   # MARCH/APRIL 2017-NEUROENDOCRINE CA STAGE IV; [name]; [name]; [name]; [name]; [name]; [name]; [name]; July 14th  2017 CT- PR  # AUG 28th- FOLFIRI +Avastin; OCT 5th CT scan- PR.   # MARCH 20th- CT STABLE liver lesions; ? Enlarging cecal lesion [CEA rising]; March 26th- FOLFOX [last avastin march 12th 2018]  # right kidney lesion 2.6 x 1.6 cm ? Angiolipoma [followed by Urology in past]     Malignant neoplasm of ascending colon ([place])   09/23/2015 Initial Diagnosis    Malignant neoplasm of ascending colon ([place])       HISTORY OF PRESENTING ILLNESS:  Kathleen James 66 y.o.  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   In the interim patient underwent surgery for her primary cecal malignancy. She has had an uncomplicated recovery so far.  Complains of mild abdominal discomfort; otherwise overall improving. Appetite is good. Denies any significant nausea vomiting. Denies any significant diarrhea. She denies any constipation. Appetite is good. No pain. No swelling of the legs. Denies any tingling or numbness. No shortness of breath or chest pain. No cough.  ROS: A complete 10 point review of system is done which is negative except mentioned above in history of present illness  MEDICAL HISTORY:   Past Medical History:  Diagnosis Date  . Angiolipoma of kidney    followed with serial CTs ,,  Dr. Jacqlyn Larsen  . Anxiety   . Arthritis   . 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 Chemotherapy induced diarrhea   . Hammer toe   . Hypertension   . Liver cancer ([place])   . Neuro-endocrine carcinoma ([place])   . Obesity (BMI 30-39.9)     SURGICAL HISTORY: Past Surgical History:  Procedure Laterality Date  . ABDOMINAL HYSTERECTOMY  1995   endometriosis, heavy bleeding  . BUNIONECTOMY Bilateral   . CESAREAN SECTION  1985  . COLONOSCOPY WITH PROPOFOL N/A 10/09/2015   Procedure: COLONOSCOPY WITH PROPOFOL;  Surgeon: Robert Bellow, MD;  Location: [facility] ENDOSCOPY;  Service: Endoscopy;  Laterality: N/A;  . IR GENERIC HISTORICAL  04/07/2016   IR RADIOLOGIST EVAL & MGMT 04/07/2016 Arne Cleveland, MD [facility] INTERV RAD  . LAPAROSCOPIC RIGHT COLECTOMY Right 10/26/2016   Procedure: LAPAROSCOPIC RIGHT COLECTOMY;  Surgeon: Robert Bellow, MD;  Location: [facility] ORS;  Service: General;  Laterality: Right;  . OOPHORECTOMY    . PORTACATH PLACEMENT Left 10/01/2015   Procedure: INSERTION PORT-A-CATH;  Surgeon: Robert Bellow, MD;  Location: [facility] ORS;  Service: General;  Laterality: Left;  . ROTATOR CUFF REPAIR Right 2008   right shoulder.  [place]     SOCIAL HISTORY: She lives at home with her family in [place]. 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 Social History   Social History  .  Marital status: Married    Spouse name: N/A  . Number of children: N/A  . Years of education: N/A   Occupational History  . Not on file.   Social History Main Topics  . Smoking status: Never Smoker  . Smokeless tobacco: Never Used  . Alcohol use 0.0 oz/week     Comment: occasionally  . Drug use: No  . Sexual activity: Not Currently   Other Topics Concern  . Not on file   Social History Narrative  . No narrative  on file    FAMILY HISTORY: Family History  Problem Relation Age of Onset  . Mental illness Mother 63       bipolar, dementia  . Cancer Maternal Aunt 70       breast ca  . Hypertension Father   . Stroke Father 51       deceased  . COPD Father   . Heart disease Sister        deceased    ALLERGIES:  is allergic to bee venom and morphine and related.  MEDICATIONS:  Current Outpatient Prescriptions  Medication Sig Dispense Refill  . ALPRAZolam (XANAX) 0.25 MG tablet TAKE 1 TABLET TWICE DAILY AS NEEDED FOR ANXIETY OR SLEEP 60 tablet 1  . amLODipine (NORVASC) 5 MG tablet TAKE ONE TABLET BY MOUTH EVERY DAY 30 tablet 3  . Biotin w/ Vitamins C & E (HAIR/SKIN/NAILS PO) Take 1 tablet by mouth daily.    . Chlorpheniramine Maleate (CHLOR-TABLETS PO) Take 1 tablet by mouth every evening.    . diphenoxylate-atropine (LOMOTIL) 2.5-0.025 MG tablet TAKE 1 TABLET FOUR TIMES DAILY AS NEEDEDFOR DIARRHEA OR LOOSE STOOLS 30 tablet 3  . lidocaine-prilocaine (EMLA) cream Apply 1 application topically as needed. Apply to port a cath site 1 hour prior to chemotherapy treatments 30 g 6  . loperamide (IMODIUM A-D) 2 MG tablet Take 2 mg by mouth 4 (four) times daily as needed for diarrhea or loose stools.    . naproxen sodium (ANAPROX) 220 MG tablet Take 440 mg by mouth 2 (two) times daily as needed.    Marland Kitchen olmesartan (BENICAR) 40 MG tablet Take 1 tablet (40 mg total) by mouth daily. 30 tablet 2  . ondansetron (ZOFRAN) 8 MG tablet Take 1 tablet (8 mg total) by mouth every 6 (six) hours as needed for nausea or vomiting. 30 tablet 3  . potassium chloride (K-DUR) 10 MEQ tablet TAKE ONE TABLET BY MOUTH TWICE DAILY 60 tablet 4  . prochlorperazine (COMPAZINE) 10 MG tablet Take 1 tablet (10 mg total) by mouth every 6 (six) hours as needed for nausea or vomiting. 30 tablet 6   No current facility-administered medications for this visit.    Facility-Administered Medications Ordered in Other Visits  Medication Dose  Route Frequency Provider Last Rate Last Dose  . heparin lock flush 100 unit/mL  500 Units Intravenous Once Charlaine Dalton R, MD      . sodium chloride flush (NS) 0.9 % injection 10 mL  10 mL Intravenous PRN [name], MD   10 mL at 11/24/15 0836      .  PHYSICAL EXAMINATION: ECOG PERFORMANCE STATUS: 0 - Asymptomatic  Vitals:   11/08/16 0845  BP: 125/85  Pulse: 93  Temp: 97 F (36.1 C)   Filed Weights   11/08/16 0845  Weight: 197 lb 1.6 oz (89.4 kg)    GENERAL: Well-nourished well-developed; Alert, no distress and comfortable. She is with daughter.  EYES: no pallor or icterus OROPHARYNX:  no thrush or ulceration; good dentition  NECK: supple, no masses felt LYMPH:  no palpable lymphadenopathy in the cervical, axillary or inguinal regions LUNGS: clear to auscultation and  No wheeze or crackles HEART/CVS: regular rate & rhythm and no murmurs; No lower extremity edema ABDOMEN: abdomen soft, non-tender and normal bowel sounds; positive for hepatomegaly. Musculoskeletal:no cyanosis of digits and no clubbing  PSYCH: alert & oriented x 3 with fluent speech NEURO: no focal motor/sensory deficits SKIN:  no rashes or significant lesions; Mediport is in place.   LABORATORY DATA:  I have reviewed the data as listed Lab Results  Component Value Date   WBC 6.3 11/04/2016   HGB 12.3 10/25/2016   HCT 38.0 11/04/2016   MCV 104 (H) 11/04/2016   PLT 386 (H) 11/04/2016    Recent Labs  10/11/16 0838 10/25/16 0819 11/04/16 1009  NA 137 137 138  K 4.1 3.9 4.6  CL 109 108 101  CO2 '23 25 22  ' GLUCOSE 109* 84 86  BUN '13 13 14  ' CREATININE 1.07* 0.97 0.98  CALCIUM 8.9 9.3 9.8  GFRNONAA 53* 60* 60  GFRAA >60 >60 70  PROT 7.7 8.2* 7.9  ALBUMIN 3.8 3.9 4.2  AST 36 29 26  ALT '27 26 25  ' ALKPHOS 90 83 98  BILITOT 0.6 0.5 0.3    RADIOGRAPHIC STUDIES: I have personally reviewed the radiological images as listed and agreed with the findings in the report. No results  found.  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 Pathology reviewed with the family. Ordered Foundation 1 testing. Discussed with Dr. Reuel Derby.   # proceed with cycle #4 next week [pt pref]. We'll recheck labs next week; CEA. Continue to hold Avastin [up to a month post surgery]  # Discussed regarding Y90- for the liver lesions. Patient previously met with IR. I left a message with IR today.  # chemo FOLFOX/labs on 5/30 [wed]; follow up with X-MD in 6/18; see me on July 2nd/labs; chemo.   CC: Dr.Hassell.     [name], MD 11/08/2016 5:17 PM

## 2016-11-08 NOTE — Assessment & Plan Note (Addendum)
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 Pathology reviewed with the family. Ordered Foundation 1 testing. Discussed with Dr. Reuel Derby.   # proceed with cycle #4 next week [pt pref]. We'll recheck labs next week; CEA. Continue to hold Avastin [up to a month post surgery]  # Discussed regarding Y90- for the liver lesions. Patient previously met with IR. I left a message with IR today.  # chemo FOLFOX/labs on 5/30 [wed]; follow up with X-MD in 6/18; see me on July 2nd/labs; chemo.   CC: Dr.Hassell.

## 2016-11-09 ENCOUNTER — Other Ambulatory Visit: Payer: Self-pay | Admitting: Interventional Radiology

## 2016-11-09 ENCOUNTER — Telehealth: Payer: Self-pay | Admitting: Internal Medicine

## 2016-11-09 ENCOUNTER — Other Ambulatory Visit (HOSPITAL_COMMUNITY): Payer: Self-pay | Admitting: Interventional Radiology

## 2016-11-09 DIAGNOSIS — C189 Malignant neoplasm of colon, unspecified: Secondary | ICD-10-CM

## 2016-11-09 DIAGNOSIS — C787 Secondary malignant neoplasm of liver and intrahepatic bile duct: Principal | ICD-10-CM

## 2016-11-09 NOTE — Telephone Encounter (Signed)
Spoke to Dr. Vernard Gambles; IR- regarding planning for Y90 for the liver lesions. He agrees to reevaluate the patient as soon as possible; for mapping- and treatment purposes. His office will coordinate with [place]- as the patient will start FOLFOX next week. [HOLD AVASTIN]. Pt will follow up with Dr.Finnegan when I am on vacation [starting June 1st].   Almyra Free- please send a new referral to [facility].

## 2016-11-10 ENCOUNTER — Encounter: Payer: Self-pay | Admitting: *Deleted

## 2016-11-10 ENCOUNTER — Other Ambulatory Visit: Payer: Self-pay | Admitting: *Deleted

## 2016-11-10 DIAGNOSIS — C189 Malignant neoplasm of colon, unspecified: Secondary | ICD-10-CM

## 2016-11-10 DIAGNOSIS — C799 Secondary malignant neoplasm of unspecified site: Principal | ICD-10-CM

## 2016-11-10 NOTE — Telephone Encounter (Signed)
Ref has been sent

## 2016-11-11 ENCOUNTER — Other Ambulatory Visit (HOSPITAL_COMMUNITY): Payer: Self-pay | Admitting: Interventional Radiology

## 2016-11-11 ENCOUNTER — Other Ambulatory Visit: Payer: Self-pay | Admitting: Interventional Radiology

## 2016-11-11 DIAGNOSIS — C189 Malignant neoplasm of colon, unspecified: Secondary | ICD-10-CM

## 2016-11-12 ENCOUNTER — Other Ambulatory Visit: Payer: Self-pay | Admitting: Interventional Radiology

## 2016-11-12 DIAGNOSIS — C189 Malignant neoplasm of colon, unspecified: Secondary | ICD-10-CM

## 2016-11-16 ENCOUNTER — Encounter: Payer: Self-pay | Admitting: Internal Medicine

## 2016-11-17 ENCOUNTER — Inpatient Hospital Stay: Payer: Medicare HMO

## 2016-11-17 VITALS — Temp 96.5°F | Resp 18

## 2016-11-17 DIAGNOSIS — C799 Secondary malignant neoplasm of unspecified site: Secondary | ICD-10-CM | POA: Diagnosis not present

## 2016-11-17 DIAGNOSIS — C182 Malignant neoplasm of ascending colon: Secondary | ICD-10-CM

## 2016-11-17 DIAGNOSIS — Z5111 Encounter for antineoplastic chemotherapy: Secondary | ICD-10-CM

## 2016-11-17 DIAGNOSIS — C189 Malignant neoplasm of colon, unspecified: Secondary | ICD-10-CM

## 2016-11-17 DIAGNOSIS — C7A8 Other malignant neuroendocrine tumors: Secondary | ICD-10-CM | POA: Diagnosis not present

## 2016-11-17 LAB — COMPREHENSIVE METABOLIC PANEL
ALT: 18 U/L (ref 14–54)
AST: 28 U/L (ref 15–41)
Albumin: 3.8 g/dL (ref 3.5–5.0)
Alkaline Phosphatase: 72 U/L (ref 38–126)
Anion gap: 6 (ref 5–15)
BUN: 12 mg/dL (ref 6–20)
CO2: 25 mmol/L (ref 22–32)
Calcium: 9.2 mg/dL (ref 8.9–10.3)
Chloride: 108 mmol/L (ref 101–111)
Creatinine, Ser: 0.95 mg/dL (ref 0.44–1.00)
GFR calc Af Amer: >60 mL/min (ref >60)
GFR calc non Af Amer: >60 mL/min (ref >60)
GLUCOSE: 82 mg/dL (ref 65–99)
POTASSIUM: 3.8 mmol/L (ref 3.5–5.1)
Sodium: 139 mmol/L (ref 135–145)
Total Bilirubin: 0.5 mg/dL (ref 0.3–1.2)
Total Protein: 7.5 g/dL (ref 6.5–8.1)

## 2016-11-17 LAB — CBC WITH DIFFERENTIAL/PLATELET
BASOS PCT: 1 %
Basophils Absolute: 0 10*3/uL (ref 0–0.1)
EOS PCT: 5 %
Eosinophils Absolute: 0.2 10*3/uL (ref 0–0.7)
HCT: 33.2 % — ABNORMAL LOW (ref 35.0–47.0)
Hemoglobin: 11.6 g/dL — ABNORMAL LOW (ref 12.0–16.0)
Lymphocytes Relative: 31 %
Lymphs Abs: 1.2 10*3/uL (ref 1.0–3.6)
MCH: 35.5 pg — ABNORMAL HIGH (ref 26.0–34.0)
MCHC: 35 g/dL (ref 32.0–36.0)
MCV: 101.5 fL — ABNORMAL HIGH (ref 80.0–100.0)
MONO ABS: 0.3 10*3/uL (ref 0.2–0.9)
Monocytes Relative: 8 %
NEUTROS ABS: 2.2 10*3/uL (ref 1.4–6.5)
Neutrophils Relative %: 55 %
PLATELETS: 318 10*3/uL (ref 150–440)
RBC: 3.27 MIL/uL — AB (ref 3.80–5.20)
RDW: 17 % — ABNORMAL HIGH (ref 11.5–14.5)
WBC: 4 10*3/uL (ref 3.6–11.0)

## 2016-11-17 MED ORDER — SODIUM CHLORIDE 0.9 % IV SOLN
INTRAVENOUS | Status: DC
  Administered 2016-11-17: 10:00:00 via INTRAVENOUS
  Filled 2016-11-17: qty 1000

## 2016-11-17 MED ORDER — SODIUM CHLORIDE 0.9 % IV SOLN
2400.0000 mg/m2 | INTRAVENOUS | Status: DC
  Administered 2016-11-17: 4850 mg via INTRAVENOUS
  Filled 2016-11-17: qty 97

## 2016-11-17 MED ORDER — LEUCOVORIN CALCIUM INJECTION 350 MG
800.0000 mg | Freq: Once | INTRAVENOUS | Status: AC
  Administered 2016-11-17: 800 mg via INTRAVENOUS
  Filled 2016-11-17: qty 40

## 2016-11-17 MED ORDER — DEXTROSE 5 % IV SOLN
85.0000 mg/m2 | Freq: Once | INTRAVENOUS | Status: AC
  Administered 2016-11-17: 170 mg via INTRAVENOUS
  Filled 2016-11-17: qty 34

## 2016-11-17 MED ORDER — MAGNESIUM SULFATE 50 % IJ SOLN
Freq: Once | INTRAMUSCULAR | Status: AC
  Administered 2016-11-17: 13:00:00 via INTRAVENOUS
  Filled 2016-11-17: qty 100

## 2016-11-17 MED ORDER — PALONOSETRON HCL INJECTION 0.25 MG/5ML
0.2500 mg | Freq: Once | INTRAVENOUS | Status: AC
Start: 2016-11-17 — End: 2016-11-17
  Administered 2016-11-17: 0.25 mg via INTRAVENOUS
  Filled 2016-11-17: qty 5

## 2016-11-17 MED ORDER — SODIUM CHLORIDE 0.9 % IV SOLN
Freq: Once | INTRAVENOUS | Status: AC
  Administered 2016-11-17: 10:00:00 via INTRAVENOUS
  Filled 2016-11-17: qty 5

## 2016-11-17 MED ORDER — SODIUM CHLORIDE 0.9% FLUSH
10.0000 mL | INTRAVENOUS | Status: DC | PRN
  Administered 2016-11-17: 10 mL
  Filled 2016-11-17: qty 10

## 2016-11-17 MED ORDER — DEXTROSE 5 % IV SOLN
Freq: Once | INTRAVENOUS | Status: AC
  Administered 2016-11-17: 10:00:00 via INTRAVENOUS
  Filled 2016-11-17: qty 1000

## 2016-11-18 ENCOUNTER — Other Ambulatory Visit: Payer: Self-pay | Admitting: Internal Medicine

## 2016-11-18 LAB — CEA: CEA: 268.6 ng/mL — ABNORMAL HIGH (ref 0.0–4.7)

## 2016-11-19 ENCOUNTER — Inpatient Hospital Stay: Payer: Medicare HMO | Attending: Internal Medicine

## 2016-11-19 ENCOUNTER — Ambulatory Visit
Admission: RE | Admit: 2016-11-19 | Discharge: 2016-11-19 | Disposition: A | Discharge status: Home / Self Care | Payer: Medicare HMO | Source: Ambulatory Visit | Attending: Interventional Radiology | Admitting: Interventional Radiology

## 2016-11-19 ENCOUNTER — Other Ambulatory Visit: Payer: Self-pay | Admitting: General Surgery

## 2016-11-19 ENCOUNTER — Encounter: Payer: Self-pay | Admitting: *Deleted

## 2016-11-19 ENCOUNTER — Other Ambulatory Visit: Payer: Self-pay | Admitting: Student

## 2016-11-19 VITALS — BP 135/86 | HR 77 | Resp 18

## 2016-11-19 DIAGNOSIS — D1771 Benign lipomatous neoplasm of kidney: Secondary | ICD-10-CM | POA: Insufficient documentation

## 2016-11-19 DIAGNOSIS — C189 Malignant neoplasm of colon, unspecified: Secondary | ICD-10-CM | POA: Diagnosis present

## 2016-11-19 DIAGNOSIS — C799 Secondary malignant neoplasm of unspecified site: Secondary | ICD-10-CM

## 2016-11-19 DIAGNOSIS — C787 Secondary malignant neoplasm of liver and intrahepatic bile duct: Secondary | ICD-10-CM | POA: Diagnosis present

## 2016-11-19 DIAGNOSIS — M5136 Other intervertebral disc degeneration, lumbar region: Secondary | ICD-10-CM | POA: Insufficient documentation

## 2016-11-19 DIAGNOSIS — Z5111 Encounter for antineoplastic chemotherapy: Secondary | ICD-10-CM

## 2016-11-19 DIAGNOSIS — C229 Malignant neoplasm of liver, not specified as primary or secondary: Secondary | ICD-10-CM | POA: Diagnosis not present

## 2016-11-19 DIAGNOSIS — C182 Malignant neoplasm of ascending colon: Secondary | ICD-10-CM | POA: Insufficient documentation

## 2016-11-19 MED ORDER — IOPAMIDOL (ISOVUE-370) INJECTION 76%
100.0000 mL | Freq: Once | INTRAVENOUS | Status: AC | PRN
  Administered 2016-11-19: 100 mL via INTRAVENOUS

## 2016-11-19 MED ORDER — HEPARIN SOD (PORK) LOCK FLUSH 100 UNIT/ML IV SOLN
500.0000 [IU] | Freq: Once | INTRAVENOUS | Status: AC | PRN
  Administered 2016-11-19: 500 [IU]
  Filled 2016-11-19: qty 5

## 2016-11-19 MED ORDER — SODIUM CHLORIDE 0.9% FLUSH
10.0000 mL | INTRAVENOUS | Status: DC | PRN
  Administered 2016-11-19: 10 mL
  Filled 2016-11-19: qty 10

## 2016-11-19 NOTE — Telephone Encounter (Signed)
Last OV with PCP 3/17 please advise to refill?

## 2016-11-19 NOTE — Telephone Encounter (Signed)
Mychart message sent.

## 2016-11-19 NOTE — Telephone Encounter (Signed)
Has been under the care of oncology.  Labs reviewed,  Will refill needs appt.

## 2016-11-22 ENCOUNTER — Other Ambulatory Visit: Payer: Self-pay | Admitting: Interventional Radiology

## 2016-11-22 ENCOUNTER — Ambulatory Visit (HOSPITAL_COMMUNITY)
Admission: RE | Admit: 2016-11-22 | Discharge: 2016-11-22 | Disposition: A | Discharge status: Home / Self Care | Payer: Medicare HMO | Source: Ambulatory Visit | Attending: Interventional Radiology | Admitting: Interventional Radiology

## 2016-11-22 ENCOUNTER — Encounter (HOSPITAL_COMMUNITY): Payer: Self-pay

## 2016-11-22 ENCOUNTER — Ambulatory Visit: Payer: Medicare HMO

## 2016-11-22 ENCOUNTER — Encounter (HOSPITAL_COMMUNITY)
Admission: RE | Admit: 2016-11-22 | Discharge: 2016-11-22 | Disposition: A | Discharge status: Home / Self Care | Payer: Medicare HMO | Source: Ambulatory Visit | Attending: Interventional Radiology | Admitting: Interventional Radiology

## 2016-11-22 DIAGNOSIS — F419 Anxiety disorder, unspecified: Secondary | ICD-10-CM | POA: Diagnosis not present

## 2016-11-22 DIAGNOSIS — C189 Malignant neoplasm of colon, unspecified: Secondary | ICD-10-CM

## 2016-11-22 DIAGNOSIS — E669 Obesity, unspecified: Secondary | ICD-10-CM | POA: Diagnosis not present

## 2016-11-22 DIAGNOSIS — I10 Essential (primary) hypertension: Secondary | ICD-10-CM | POA: Insufficient documentation

## 2016-11-22 DIAGNOSIS — Z885 Allergy status to narcotic agent status: Secondary | ICD-10-CM | POA: Diagnosis not present

## 2016-11-22 DIAGNOSIS — C785 Secondary malignant neoplasm of large intestine and rectum: Secondary | ICD-10-CM | POA: Insufficient documentation

## 2016-11-22 DIAGNOSIS — M1990 Unspecified osteoarthritis, unspecified site: Secondary | ICD-10-CM | POA: Diagnosis not present

## 2016-11-22 DIAGNOSIS — C787 Secondary malignant neoplasm of liver and intrahepatic bile duct: Secondary | ICD-10-CM | POA: Diagnosis not present

## 2016-11-22 DIAGNOSIS — R69 Illness, unspecified: Secondary | ICD-10-CM | POA: Diagnosis not present

## 2016-11-22 HISTORY — PX: IR US GUIDE VASC ACCESS RIGHT: IMG2390

## 2016-11-22 HISTORY — PX: IR ANGIOGRAM VISCERAL SELECTIVE: IMG657

## 2016-11-22 HISTORY — PX: IR ANGIOGRAM SELECTIVE EACH ADDITIONAL VESSEL: IMG667

## 2016-11-22 HISTORY — PX: IR EMBO ARTERIAL NOT HEMORR HEMANG INC GUIDE ROADMAPPING: IMG5448

## 2016-11-22 LAB — CBC WITH DIFFERENTIAL/PLATELET
BASOS ABS: 0 10*3/uL (ref 0.0–0.1)
BASOS PCT: 0 %
Eosinophils Absolute: 0.1 10*3/uL (ref 0.0–0.7)
Eosinophils Relative: 4 %
HEMATOCRIT: 34 % — AB (ref 36.0–46.0)
HEMOGLOBIN: 11.1 g/dL — AB (ref 12.0–15.0)
LYMPHS PCT: 31 %
Lymphs Abs: 1 10*3/uL (ref 0.7–4.0)
MCH: 33.2 pg (ref 26.0–34.0)
MCHC: 32.6 g/dL (ref 30.0–36.0)
MCV: 101.8 fL — ABNORMAL HIGH (ref 78.0–100.0)
MONOS PCT: 1 %
Monocytes Absolute: 0 10*3/uL — ABNORMAL LOW (ref 0.1–1.0)
NEUTROS PCT: 64 %
Neutro Abs: 2.1 10*3/uL (ref 1.7–7.7)
Platelets: 259 10*3/uL (ref 150–400)
RBC: 3.34 MIL/uL — ABNORMAL LOW (ref 3.87–5.11)
RDW: 15.3 % (ref 11.5–15.5)
WBC: 3.4 10*3/uL — ABNORMAL LOW (ref 4.0–10.5)

## 2016-11-22 LAB — COMPREHENSIVE METABOLIC PANEL
ALBUMIN: 3.7 g/dL (ref 3.5–5.0)
ALT: 21 U/L (ref 14–54)
AST: 27 U/L (ref 15–41)
Alkaline Phosphatase: 73 U/L (ref 38–126)
Anion gap: 4 — ABNORMAL LOW (ref 5–15)
BUN: 17 mg/dL (ref 6–20)
CHLORIDE: 108 mmol/L (ref 101–111)
CO2: 25 mmol/L (ref 22–32)
CREATININE: 0.9 mg/dL (ref 0.44–1.00)
Calcium: 8.9 mg/dL (ref 8.9–10.3)
GFR calc Af Amer: >60 mL/min (ref >60)
Glucose, Bld: 94 mg/dL (ref 65–99)
POTASSIUM: 4.4 mmol/L (ref 3.5–5.1)
SODIUM: 137 mmol/L (ref 135–145)
Total Bilirubin: 0.4 mg/dL (ref 0.3–1.2)
Total Protein: 7.4 g/dL (ref 6.5–8.1)

## 2016-11-22 LAB — PROTIME-INR
INR: 0.95
PROTHROMBIN TIME: 12.7 s (ref 11.4–15.2)

## 2016-11-22 MED ORDER — IOPAMIDOL (ISOVUE-300) INJECTION 61%
INTRAVENOUS | Status: AC
  Filled 2016-11-22: qty 300

## 2016-11-22 MED ORDER — IOPAMIDOL (ISOVUE-300) INJECTION 61%
100.0000 mL | Freq: Once | INTRAVENOUS | Status: AC | PRN
  Administered 2016-11-22: 36 mL via INTRA_ARTERIAL

## 2016-11-22 MED ORDER — PIPERACILLIN-TAZOBACTAM 3.375 G IVPB
3.3750 g | INTRAVENOUS | Status: AC
  Administered 2016-11-22: 3.375 g via INTRAVENOUS

## 2016-11-22 MED ORDER — LIDOCAINE HCL 1 % IJ SOLN
INTRAMUSCULAR | Status: AC | PRN
  Administered 2016-11-22: 5 mL via INTRADERMAL

## 2016-11-22 MED ORDER — SODIUM CHLORIDE 0.9 % IV SOLN
INTRAVENOUS | Status: DC
  Administered 2016-11-22: 09:00:00 via INTRAVENOUS

## 2016-11-22 MED ORDER — HEPARIN SOD (PORK) LOCK FLUSH 100 UNIT/ML IV SOLN
500.0000 [IU] | INTRAVENOUS | Status: DC | PRN
  Filled 2016-11-22: qty 5

## 2016-11-22 MED ORDER — FENTANYL CITRATE (PF) 100 MCG/2ML IJ SOLN
INTRAMUSCULAR | Status: AC | PRN
  Administered 2016-11-22 (×3): 50 ug via INTRAVENOUS

## 2016-11-22 MED ORDER — ONDANSETRON HCL 4 MG/2ML IJ SOLN: 8.0000 mg | Freq: Once | INTRAMUSCULAR | Status: DC

## 2016-11-22 MED ORDER — MIDAZOLAM HCL 2 MG/2ML IJ SOLN
INTRAMUSCULAR | Status: AC | PRN
  Administered 2016-11-22 (×5): 1 mg via INTRAVENOUS

## 2016-11-22 MED ORDER — LIDOCAINE HCL 1 % IJ SOLN
INTRAMUSCULAR | Status: AC
  Filled 2016-11-22: qty 20

## 2016-11-22 MED ORDER — FENTANYL CITRATE (PF) 100 MCG/2ML IJ SOLN
INTRAMUSCULAR | Status: AC
  Filled 2016-11-22: qty 4

## 2016-11-22 MED ORDER — PIPERACILLIN-TAZOBACTAM 3.375 G IVPB
INTRAVENOUS | Status: AC
  Administered 2016-11-22: 3.375 g via INTRAVENOUS
  Filled 2016-11-22: qty 50

## 2016-11-22 MED ORDER — PANTOPRAZOLE SODIUM 40 MG IV SOLR: 40.0000 mg | INTRAVENOUS | Status: DC

## 2016-11-22 MED ORDER — IOPAMIDOL (ISOVUE-300) INJECTION 61%
100.0000 mL | Freq: Once | INTRAVENOUS | Status: AC | PRN
  Administered 2016-11-22: 40 mL via INTRA_ARTERIAL

## 2016-11-22 MED ORDER — TECHNETIUM TO 99M ALBUMIN AGGREGATED: 5.1000 | Freq: Once | INTRAVENOUS | Status: DC | PRN

## 2016-11-22 MED ORDER — MIDAZOLAM HCL 2 MG/2ML IJ SOLN
INTRAMUSCULAR | Status: AC
  Filled 2016-11-22: qty 6

## 2016-11-22 NOTE — H&P (Signed)
Chief Complaint: Patient was seen in consultation today for pre Y90 roadmapping for liver metastasis  Referring Physician(s): Dr. Charlaine Dalton  Supervising Physician: Arne Cleveland  Patient Status: [facility] - Out-pt  History of Present Illness: Kathleen James is a 66 y.o. female with metastatic colonic neuroendocrine carcinoma.  Patient was seen in consultation with Dr. Vernard Gambles on 04/07/2016.   The patient initially presented for ultrasound on 09/09/2015 with a history of right upper quadrant abdominal pain postprandial. Ultrasound suggested hepatic metastatic disease.  Follow-up CT confirmed multiple liver metastases measure up to 8.4 cm, as well as an ascending colon mass with right ileocolic adenopathy.  Liver core biopsy 09/26/2015 demonstrated metastatic neuroendocrine carcinoma. A follow-up colon Biopsy matched the liver pathology.  PET CT 10/06/2015 demonstrated hypermetabolic activity in the colonic lesion, ileocolic adenopathy, and liver lesions. No evidence of extra abdominal metastatic disease.  At that time, Y90 was offered as concurrent treatment for long-term management of metastatic disease should she become treatment-refractory.  Patient has been followed by Dr. Rogue Bussing who consulted IR for proceeding with Y90 at this time.   Her most recent CTA Abdomen from 6/1 shows: VASCULAR 1. Conventional hepatic arterial supply with identification of the right gastric artery arising from the left lateral segmental hepatic Artery.  NON-VASCULAR 1. Interval progression of hepatic metastatic disease with dominant hepatic lesion now measuring 6 cm in diameter, previously, 4.8 cm, and additional subcapsular lesion at the junction of the right and left lobe of the liver now measuring 1.5 cm, previously, 1.3 cm. No definitive new hepatic lesions. 2. Previously noted right lower quadrant mesenteric adenopathy is not imaged on the present examination however there has  been interval increase in size of dominant aorto caval lymph node, currently measuring 0.8 cm in diameter, previously, 0.5 cm. Continued attention on follow-up is recommended. 3. 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  Patient presents for procedure today.  She has been in her usual state of health.  She has been NPO.  She does not take blood thinners.   Past Medical History:  Diagnosis Date  . Angiolipoma of kidney    followed with serial CTs ,,  Dr. Jacqlyn Larsen  . Anxiety   . Arthritis   . 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 Chemotherapy induced diarrhea   . Hammer toe   . Hypertension   . Liver cancer ([place])   . Neuro-endocrine carcinoma ([place])   . Obesity (BMI 30-39.9)     Past Surgical History:  Procedure Laterality Date  . ABDOMINAL HYSTERECTOMY  1995   endometriosis, heavy bleeding  . BUNIONECTOMY Bilateral   . CESAREAN SECTION  1985  . COLONOSCOPY WITH PROPOFOL N/A 10/09/2015   Procedure: COLONOSCOPY WITH PROPOFOL;  Surgeon: Robert Bellow, MD;  Location: [facility] ENDOSCOPY;  Service: Endoscopy;  Laterality: N/A;  . IR GENERIC HISTORICAL  04/07/2016   IR RADIOLOGIST EVAL & MGMT 04/07/2016 Arne Cleveland, MD [facility] INTERV RAD  . LAPAROSCOPIC RIGHT COLECTOMY Right 10/26/2016   Procedure: LAPAROSCOPIC RIGHT COLECTOMY;  Surgeon: Robert Bellow, MD;  Location: [facility] ORS;  Service: General;  Laterality: Right;  . OOPHORECTOMY    . PORTACATH PLACEMENT Left 10/01/2015   Procedure: INSERTION PORT-A-CATH;  Surgeon: Robert Bellow, MD;  Location: [facility] ORS;  Service: General;  Laterality: Left;  . ROTATOR CUFF REPAIR Right 2008   right shoulder.  [place]     Allergies: Bee venom; Dilaudid [hydromorphone hcl]; and Morphine and related  Medications: Prior to Admission medications   Medication Sig Start Date End Date Taking? Authorizing Provider  ALPRAZolam (XANAX) 0.25 MG tablet TAKE 1 TABLET TWICE DAILY AS NEEDED FOR ANXIETY OR  SLEEP 10/21/16  Yes [name], MD  amLODipine (NORVASC) 5 MG tablet TAKE ONE TABLET BY MOUTH EVERY DAY 11/19/16  Yes [name], MD  Biotin w/ Vitamins C & E (HAIR/SKIN/NAILS PO) Take 1 tablet by mouth daily.   Yes [provider]  Chlorpheniramine Maleate (CHLOR-TABLETS PO) Take 1 tablet by mouth every evening.   Yes [provider]  diphenoxylate-atropine (LOMOTIL) 2.5-0.025 MG tablet TAKE 1 TABLET FOUR TIMES DAILY AS NEEDEDFOR DIARRHEA OR LOOSE STOOLS 08/17/16  Yes [name], MD  loperamide (IMODIUM A-D) 2 MG tablet Take 2 mg by mouth 4 (four) times daily as needed for diarrhea or loose stools.   Yes [provider]  naproxen sodium (ANAPROX) 220 MG tablet Take 440 mg by mouth 2 (two) times daily as needed.   Yes [provider]  olmesartan (BENICAR) 40 MG tablet Take 1 tablet (40 mg total) by mouth daily. 04/30/16  Yes [name], MD  olmesartan (BENICAR) 40 MG tablet TAKE ONE TABLET BY MOUTH EVERY DAY 11/19/16  Yes [name], MD  ondansetron (ZOFRAN) 8 MG tablet Take 1 tablet (8 mg total) by mouth every 6 (six) hours as needed for nausea or vomiting. 12/19/15  Yes [name], MD  potassium chloride (K-DUR) 10 MEQ tablet TAKE ONE TABLET BY MOUTH TWICE DAILY 10/19/16  Yes [name], MD  prochlorperazine (COMPAZINE) 10 MG tablet Take 1 tablet (10 mg total) by mouth every 6 (six) hours as needed for nausea or vomiting. 10/07/15  Yes [name], MD  lidocaine-prilocaine (EMLA) cream Apply 1 application topically as needed. Apply to port a cath site 1 hour prior to chemotherapy treatments 08/30/16   [name], MD     Family History  Problem Relation Age of Onset  . Mental illness Mother 62       bipolar, dementia  . Cancer Maternal Aunt 70       breast ca  . Hypertension Father   . Stroke Father 58       deceased  . COPD Father   . Heart disease Sister        deceased    Social History    Social History  . Marital status: Married    Spouse name: N/A  . Number of children: N/A  . Years of education: N/A   Social History Main Topics  . Smoking status: Never Smoker  . Smokeless tobacco: Never Used  . Alcohol use 0.0 oz/week     Comment: occasionally  . Drug use: No  . Sexual activity: Not Currently   Other Topics Concern  . None   Social History Narrative  . None    Review of Systems  Constitutional: Negative for fatigue and fever.  Respiratory: Negative for cough and shortness of breath.   Cardiovascular: Negative for chest pain.  Gastrointestinal: Negative for abdominal pain.  Psychiatric/Behavioral: Negative for behavioral problems and confusion.    Vital Signs: BP (!) 133/94 (BP Location: Right Arm)   Pulse 92   Temp 97.5 F (36.4 C) (Oral)   Resp 16   SpO2 100%   Physical Exam  Constitutional: She is oriented to person, place, and time. She appears well-developed.  Cardiovascular: Normal rate, regular rhythm and normal heart sounds.  Pulmonary/Chest: Effort normal and breath sounds normal. No respiratory distress.  Neurological: She is alert and oriented to person, place, and time.  Skin: Skin is warm and dry.  Psychiatric: She has a normal mood and affect. Her behavior is normal. Judgment and thought content normal.  Nursing note and vitals reviewed.   Mallampati Score:  MD Evaluation Airway: WNL Heart: WNL Abdomen: WNL Chest/ Lungs: WNL ASA  Classification: 3 Mallampati/Airway Score: Three  Imaging: Ct Angio Abdomen W &/or Wo Contrast  Result Date: 11/19/2016 CLINICAL DATA:  History of metastatic colon cancer.  Pre Y 90 EXAM: CT ANGIOGRAPHY ABDOMEN TECHNIQUE: Multidetector CT imaging of the abdomen was performed using the standard protocol during bolus administration of intravenous contrast. Multiplanar reconstructed images and MIPs were obtained and reviewed to evaluate the vascular anatomy. CONTRAST:  100 cc Isovue 370 COMPARISON:   CT abdomen pelvis - 09/07/2016; 07/15/2016 ; 09/17/2015 FINDINGS: VASCULAR Aorta: Minimal amount of eccentric mixed calcified and noncalcified atherosclerotic plaque within a tortuous but normal caliber abdominal aorta, not resulting in hemodynamically significant stenosis. No abdominal aortic dissection or periaortic stranding. Celiac: Widely patent without hemodynamically significant stenosis. Conventional branching pattern. The right gastric artery arises from the left lateral segmental hepatic artery (representative images 54 through 64, series 4; coronal image 59, series 7). SMA: Widely patent without hemodynamically significant narrowing. Conventional branching pattern. The distal tributaries of the SMA are widely patent without discrete intraluminal filling defect to suggest distal embolism. Renals: Solitary bilaterally. Both renal artery's are widely patent without hemodynamically significant stenosis. No vessel irregularity to suggest FMD. IMA: Remains patent throughout its imaged course. Veins: IVC appears widely patent. Review of the MIP images confirms the above findings. NON-VASCULAR Lower chest: Limited visualization of lower thorax demonstrates minimal dependent subpleural ground-glass atelectasis. No discrete focal airspace opacities. No pleural effusion. Normal heart size. Minimal calcifications within the mitral valve annulus. No pericardial effusion. Hepatobiliary: Normal hepatic contour. No ascites. The portal vein remains widely patent. There is a large (approximately 6.0 x 3.6 cm) heterogeneously enhancing mass within the subcapsular aspect of the right lobe of the liver, increased in size compared to the 09/07/2016 examination, previously, 4.8 x 3.3 cm. There is an approximately 1.5 x 1.0 cm hypoattenuating lesion within the subcapsular aspect of the junction of the medial segment of the left lobe of the liver and anterior segment of the right lobe of the liver (image 25, series 11), increased  in size compared to the 08/2016 examination, previously, 1.3 x 1.1 cm. There is a punctate (approximately 0.9 cm) hypoattenuating lesion within the dome of the right lobe of the liver (best seen on coronal image 44, series 3) which is unchanged compared to the 08/2016 examination though decreased in size compared to the 08/2015 and thus favored to represent an additional hepatic metastasis. Additionally, there is an ill-defined approximately 1.2 x 0.5 cm hypoattenuating lesion within the medial segment of the left lobe of the liver (axial image 19, series 11, coronal image 42, series 13), unchanged compared to the 09/07/2016 Ill-defined approximately 1.7 cm peripherally enhancing suspected hemangioma within the caudal aspect the right lobe of the liver (image 40, series 11) is unchanged. Approximately 1.2 cm hypoattenuating lesions within the left lobe of the liver (images 19 and 21, series 11) are unchanged and favored to represent hepatic cysts. Additional smaller punctate hypoattenuating hepatic lesions are too small to accurately characterize though favored to represent additional hepatic cysts. Pancreas: Normal appearance of the pancreas Spleen: Normal appearance of the  spleen. Note is made of a small splenule. Adrenals/Urinary Tract: There is symmetric enhancement and excretion of the bilateral kidneys. 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 No discrete left-sided renal lesions. Normal appearance of bilateral adrenal glands. Stomach/Bowel: Post right hemicolectomy. No evidence of enteric obstruction. No pneumoperitoneum, pneumatosis or portal venous gas. Lymphatic: Previously noted right lower quadrant mesenteric adenopathy is not imaged on the present examination. Minimal increase in size of dominant aortocaval lymph node, currently measuring 0.8 cm in greatest short axis diameter (image 29, series 11), previously, 0.5 cm. Other:  Well-healed midline ventral abdominal incision without hernia. Musculoskeletal: No acute or aggressive osseous abnormalities. Mild-to-moderate multilevel lumbar spine DDD, worse at L4-L5 with disc space height loss, endplate irregularity and sclerosis. There is partial ossification of the L1-L2 intervertebral disc. IMPRESSION: VASCULAR 1. Conventional hepatic arterial supply with identification of the right gastric artery arising from the left lateral segmental hepatic artery. NON-VASCULAR 1. Interval progression of hepatic metastatic disease with dominant hepatic lesion now measuring 6 cm in diameter, previously, 4.8 cm, and additional subcapsular lesion at the junction of the right and left lobe of the liver now measuring 1.5 cm, previously, 1.3 cm. No definitive new hepatic lesions. 2. Previously noted right lower quadrant mesenteric adenopathy is not imaged on the present examination however there has been interval increase in size of dominant aorto caval lymph node, currently measuring 0.8 cm in diameter, previously, 0.5 cm. Continued attention on follow-up is recommended. 3. 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 Electronically Signed   By: Sandi Mariscal M.D.   On: 11/19/2016 15:16    Labs:  CBC:  Recent Labs  10/11/16 0838 10/25/16 0819 11/04/16 1009 11/17/16 0829 11/22/16 0843  WBC 3.6 4.0 6.3 4.0 3.4*  HGB 12.1 12.3  --  11.6* 11.1*  HCT 34.2* 34.5* 38.0 33.2* 34.0*  PLT 225 211 386* 318 259    COAGS:  Recent Labs  11/22/16 0843  INR 0.95    BMP:  Recent Labs  10/25/16 0819 11/04/16 1009 11/17/16 0829 11/22/16 0843  NA 137 138 139 137  K 3.9 4.6 3.8 4.4  CL 108 101 108 108  CO2 '25 22 25 25  ' GLUCOSE 84 86 82 94  BUN '13 14 12 17  ' CALCIUM 9.3 9.8 9.2 8.9  CREATININE 0.97 0.98 0.95 0.90  GFRNONAA 60* 60 >60 >60  GFRAA >60 70 >60 >60    LIVER FUNCTION TESTS:  Recent Labs  10/25/16 0819 11/04/16 1009 11/17/16 0829 11/22/16 0843  BILITOT 0.5 0.3 0.5 0.4  AST '29 26  28 27  ' ALT '26 25 18 21  ' ALKPHOS 83 98 72 73  PROT 8.2* 7.9 7.5 7.4  ALBUMIN 3.9 4.2 3.8 3.7    TUMOR MARKERS:  Recent Labs  10/11/16 0838 10/25/16 0819 11/04/16 1009 11/17/16 0829  CEA 254.9* 300.7* 254.9* 268.6*    Assessment and Plan: Patient with history of metastatic colonic neuroendocrine cancer with liver lesions presents for Y90 roadmapping procedure.  Patient previously met with Dr. Vernard Gambles 04/07/16.  At that time, discussion regarding  need for a preliminary mapping angiogram with possible embolization of any at-risk mesenteric branches, the procedure of transarterial Y 90 radio embolization, pathophysiology of radio embolization in the liver, possible side effects, need for continued surveillance, hepatic radiosensitivity, and concerns regarding nontarget embolization were held. Patient underwent CTA Abdomen 11/19/16.  She presents for procedure today.  She has been NPO.  She does not take blood thinners.  She has been in her usual state of health. Risks and benefits discussed with the patient including, but not limited to bleeding, infection, vascular injury, post procedural pain, nausea, vomiting and fatigue, contrast induced renal failure, liver failure, radiation injury to the bowel, radiation induced cholecystitis, neutropenia and possible need for additional procedures. All of the patient's questions were answered, patient is agreeable to proceed. Consent signed and in chart.  Thank you for this interesting consult.  I greatly enjoyed meeting KINNLEY PAULSON and look forward to participating in their care.  A copy of this report was sent to the requesting provider on this date.  Electronically Signed: Docia Barrier, PA 11/22/2016, 10:14 AM   I spent a total of    15 Minutes in face to face in clinical consultation, greater than 50% of which was counseling/coordinating care for Y90 roadmapping, metastatic colonic neuroendocrine tumor.

## 2016-11-22 NOTE — Procedures (Signed)
Mesenteric and hepatic arteriogram Gastroduodenal artery embolization MAA intra-arterial test bolus   No complication No blood loss. See complete dictation in [facility].  Dillard Cannon MD Pager [phone] Main # [phone]

## 2016-11-22 NOTE — Discharge Instructions (Signed)
Hepatic Artery Radioembolization, Care After This sheet gives you information about how to care for yourself after your procedure. Your health care provider may also give you more specific instructions. If you have problems or questions, contact your health care provider. What can I expect after the procedure? After the procedure, it is common to have:  A slight fever for 1-2 weeks. If your fever gets worse, tell your health care provider.  Fatigue.  Loss of appetite. This should gradually improve after about 1 week.  Abdominal pain on your right side.  Soreness and tenderness in your groin area where the needle and catheter were placed (puncture site).  Follow these instructions at home: Puncture site care  Follow instructions from your health care provider about how to take care of the puncture site. Make sure you: ? Wash your hands with soap and water before you change your bandage (dressing). If soap and water are not available, use hand sanitizer. ? Change your dressing as told by your health care provider. ? Leave stitches (sutures), skin glue, or adhesive strips in place. These skin closures may need to stay in place for 2 weeks or longer. If adhesive strip edges start to loosen and curl up, you may trim the loose edges. Do not remove adhesive strips completely unless your health care provider tells you to do that.  Check your puncture site every day for signs of infection. Check for: ? More redness, swelling, or pain. ? More fluid or blood. ? Warmth. ? Pus or a bad smell. Activity  Rest and return to your normal activities as told by your health care provider. Ask your health care provider what activities are safe for you.  Do not drive for 24 hours after the procedure if you were given a medicine to help you relax (sedative).  Do not lift anything that is heavier than 10 lb (4.5 kg) until your health care provider says that it is safe. Medicines  Take over-the-counter  and prescription medicines only as told by your health care provider.  Do not drive or use heavy machinery while taking prescription pain medicine. Radiation precautions  For up to a week after your procedure, there will be a small amount of radioactivity near your liver. This is not especially dangerous to other people. However, you should follow these precautions for 7 days: ? Do not come in close contact with people. ? Do not sleep in the same bed as someone else. ? Do not hold children or babies. ? Do not have contact with pregnant women. General instructions   To prevent or treat constipation while you are taking prescription pain medicine, your health care provider may recommend that you: ? Drink enough fluid to keep your urine clear or pale yellow. ? Take over-the-counter or prescription medicines. ? Eat foods that are high in fiber, such as fresh fruits and vegetables, whole grains, and beans. ? Limit foods that are high in fat and processed sugars, such as fried and sweet foods.  Eat frequent small meals until your appetite returns. Follow instructions from your health care provider about eating or drinking restrictions.  Do not take baths, swim, or use a hot tub until your health care provider approves. You may take showers. Wash your puncture site with mild soap and water and pat the area dry.  Wear compression stockings as told by your health care provider. These stockings help to prevent blood clots and reduce swelling in your legs.  Keep all follow-up visits  as told by your health care provider. This is important. You may need to have blood tests and imaging tests done. Contact a health care provider if:  You have more redness, swelling, or pain around your puncture site.  You have more fluid or blood coming from your puncture site.  Your puncture site feels warm to the touch.  You have pus or a bad smell coming from your puncture site.  You have pain that: ? Gets  worse. ? Does not get better with medicine. ? Feels like very bad heartburn. ? Is in the middle of your abdomen, above your belly button.  Your skin and the white parts of your eyes turn yellow (jaundice).  The color of your urine changes to [place] brown.  The color of your stool changes to light yellow.  Your abdominal measurement (girth) increases in a short period of time.  You gain more than 5 lb (2.3 kg) in a short period of time. Get help right away if:  You have a fever that lasts longer than 2 weeks or is higher than what your health care provider told you to expect.  You develop any of the following in your legs: ? Pain. ? Swelling. ? Skin that is cold or pale or turns blue.  You have chest pain.  You have blood in your vomit, saliva, or stool.  You have trouble breathing. This information is not intended to replace advice given to you by your health care provider. Make sure you discuss any questions you have with your health care provider. Document Released: 06/12/2013 Document Revised: 03/06/2016 Document Reviewed: 03/06/2016 Elsevier Interactive Patient Education  2018 [place].   Moderate Conscious Sedation, Adult, Care After These instructions provide you with information about caring for yourself after your procedure. Your health care provider may also give you more specific instructions. Your treatment has been planned according to current medical practices, but problems sometimes occur. Call your health care provider if you have any problems or questions after your procedure. What can I expect after the procedure? After your procedure, it is common:  To feel sleepy for several hours.  To feel clumsy and have poor balance for several hours.  To have poor judgment for several hours.  To vomit if you eat too soon.  Follow these instructions at home: For at least 24 hours after the procedure:   Do not: ? Participate in activities where you could fall  or become injured. ? Drive. ? Use heavy machinery. ? Drink alcohol. ? Take sleeping pills or medicines that cause drowsiness. ? Make important decisions or sign legal documents. ? Take care of children on your own.  Rest. Eating and drinking  Follow the diet recommended by your health care provider.  If you vomit: ? Drink water, juice, or soup when you can drink without vomiting. ? Make sure you have little or no nausea before eating solid foods. General instructions  Have a responsible adult stay with you until you are awake and alert.  Take over-the-counter and prescription medicines only as told by your health care provider.  If you smoke, do not smoke without supervision.  Keep all follow-up visits as told by your health care provider. This is important. Contact a health care provider if:  You keep feeling nauseous or you keep vomiting.  You feel light-headed.  You develop a rash.  You have a fever. Get help right away if:  You have trouble breathing. This information is not intended  to replace advice given to you by your health care provider. Make sure you discuss any questions you have with your health care provider. Document Released: 03/28/2013 Document Revised: 11/10/2015 Document Reviewed: 09/27/2015 Elsevier Interactive Patient Education  2018 [place] Radioembolization Discharge Instructions  You have been given a radioactive material during your procedure.  While it is safe for you to be discharged home from the hospital, you need to proceed directly home.    Do not use public transportation, including air travel, lasting more than 2 hours for 1 week.  Avoid crowded public places for 1 week.  Adult visitors should try to avoid close contact with you for 1 week.    Children and pregnant females should not visit or have close contact with you for 1 week.  Items that you touch are not radioactive.  Do not sleep in the same bed as  your partner for 1 week, and a condom should be used for sexual activity during the first 24 hours.  Your blood may be radioactive and caution should be used if any bleeding occurs during the recovery period.  Body fluids may be radioactive for 24 hours.  Wash your hands after voiding.  Men should sit to urinate.  Dispose of any soiled materials (flush down toilet or place in trash at home) during the first day.  Drink 6 to 8 glasses of fluids per day for 5 days to hydrate yourself.  If you need to see a doctor during the first week, you must let them know that you were treated with yttrium-90 microspheres, and will be slightly radioactive.  They can call Interventional Radiology [phone] with any questions.

## 2016-11-23 ENCOUNTER — Other Ambulatory Visit: Payer: Self-pay

## 2016-11-29 ENCOUNTER — Encounter: Payer: Self-pay | Admitting: Internal Medicine

## 2016-11-29 DIAGNOSIS — C799 Secondary malignant neoplasm of unspecified site: Secondary | ICD-10-CM | POA: Diagnosis not present

## 2016-11-29 DIAGNOSIS — C182 Malignant neoplasm of ascending colon: Secondary | ICD-10-CM | POA: Diagnosis not present

## 2016-12-06 ENCOUNTER — Other Ambulatory Visit: Payer: Self-pay | Admitting: Radiology

## 2016-12-06 ENCOUNTER — Ambulatory Visit: Payer: Self-pay | Admitting: Oncology

## 2016-12-06 ENCOUNTER — Other Ambulatory Visit: Payer: Self-pay

## 2016-12-06 ENCOUNTER — Ambulatory Visit: Payer: Self-pay

## 2016-12-06 ENCOUNTER — Encounter: Payer: Self-pay | Admitting: Internal Medicine

## 2016-12-07 ENCOUNTER — Telehealth: Payer: Self-pay | Admitting: *Deleted

## 2016-12-07 ENCOUNTER — Other Ambulatory Visit: Payer: Self-pay | Admitting: Interventional Radiology

## 2016-12-07 ENCOUNTER — Encounter (HOSPITAL_COMMUNITY)
Admission: RE | Admit: 2016-12-07 | Discharge: 2016-12-07 | Disposition: A | Discharge status: Home / Self Care | Payer: Medicare HMO | Source: Ambulatory Visit | Attending: Interventional Radiology | Admitting: Interventional Radiology

## 2016-12-07 ENCOUNTER — Encounter (HOSPITAL_COMMUNITY): Payer: Self-pay

## 2016-12-07 ENCOUNTER — Ambulatory Visit (HOSPITAL_COMMUNITY)
Admission: RE | Admit: 2016-12-07 | Discharge: 2016-12-07 | Disposition: A | Discharge status: Home / Self Care | Payer: Medicare HMO | Source: Ambulatory Visit | Attending: Interventional Radiology | Admitting: Interventional Radiology

## 2016-12-07 DIAGNOSIS — E669 Obesity, unspecified: Secondary | ICD-10-CM | POA: Diagnosis not present

## 2016-12-07 DIAGNOSIS — M1990 Unspecified osteoarthritis, unspecified site: Secondary | ICD-10-CM | POA: Diagnosis not present

## 2016-12-07 DIAGNOSIS — C189 Malignant neoplasm of colon, unspecified: Secondary | ICD-10-CM | POA: Insufficient documentation

## 2016-12-07 DIAGNOSIS — F419 Anxiety disorder, unspecified: Secondary | ICD-10-CM | POA: Diagnosis not present

## 2016-12-07 DIAGNOSIS — I10 Essential (primary) hypertension: Secondary | ICD-10-CM | POA: Insufficient documentation

## 2016-12-07 DIAGNOSIS — Z885 Allergy status to narcotic agent status: Secondary | ICD-10-CM | POA: Diagnosis not present

## 2016-12-07 DIAGNOSIS — R69 Illness, unspecified: Secondary | ICD-10-CM | POA: Diagnosis not present

## 2016-12-07 DIAGNOSIS — C785 Secondary malignant neoplasm of large intestine and rectum: Secondary | ICD-10-CM | POA: Insufficient documentation

## 2016-12-07 DIAGNOSIS — C787 Secondary malignant neoplasm of liver and intrahepatic bile duct: Secondary | ICD-10-CM | POA: Diagnosis not present

## 2016-12-07 HISTORY — PX: IR EMBO TUMOR ORGAN ISCHEMIA INFARCT INC GUIDE ROADMAPPING: IMG5449

## 2016-12-07 HISTORY — PX: IR ANGIOGRAM SELECTIVE EACH ADDITIONAL VESSEL: IMG667

## 2016-12-07 HISTORY — PX: IR ANGIOGRAM VISCERAL SELECTIVE: IMG657

## 2016-12-07 HISTORY — PX: IR US GUIDE VASC ACCESS RIGHT: IMG2390

## 2016-12-07 LAB — CBC WITH DIFFERENTIAL/PLATELET
Basophils Absolute: 0 10*3/uL (ref 0.0–0.1)
Basophils Relative: 1 %
EOS PCT: 3 %
Eosinophils Absolute: 0.1 10*3/uL (ref 0.0–0.7)
HEMATOCRIT: 33.7 % — AB (ref 36.0–46.0)
Hemoglobin: 11.4 g/dL — ABNORMAL LOW (ref 12.0–15.0)
Lymphocytes Relative: 40 %
Lymphs Abs: 1.3 10*3/uL (ref 0.7–4.0)
MCH: 34.5 pg — AB (ref 26.0–34.0)
MCHC: 33.8 g/dL (ref 30.0–36.0)
MCV: 102.1 fL — AB (ref 78.0–100.0)
MONO ABS: 0.5 10*3/uL (ref 0.1–1.0)
Monocytes Relative: 16 %
NEUTROS ABS: 1.2 10*3/uL — AB (ref 1.7–7.7)
Neutrophils Relative %: 40 %
PLATELETS: 331 10*3/uL (ref 150–400)
RBC: 3.3 MIL/uL — ABNORMAL LOW (ref 3.87–5.11)
RDW: 15.4 % (ref 11.5–15.5)
WBC: 3.1 10*3/uL — ABNORMAL LOW (ref 4.0–10.5)

## 2016-12-07 LAB — COMPREHENSIVE METABOLIC PANEL
ALT: 19 U/L (ref 14–54)
AST: 25 U/L (ref 15–41)
Albumin: 4.1 g/dL (ref 3.5–5.0)
Alkaline Phosphatase: 80 U/L (ref 38–126)
Anion gap: 8 (ref 5–15)
BILIRUBIN TOTAL: 0.4 mg/dL (ref 0.3–1.2)
BUN: 17 mg/dL (ref 6–20)
CHLORIDE: 108 mmol/L (ref 101–111)
CO2: 23 mmol/L (ref 22–32)
CREATININE: 0.92 mg/dL (ref 0.44–1.00)
Calcium: 9.3 mg/dL (ref 8.9–10.3)
Glucose, Bld: 89 mg/dL (ref 65–99)
POTASSIUM: 4.2 mmol/L (ref 3.5–5.1)
Sodium: 139 mmol/L (ref 135–145)
TOTAL PROTEIN: 8.2 g/dL — AB (ref 6.5–8.1)

## 2016-12-07 LAB — PROTIME-INR
INR: 0.91
PROTHROMBIN TIME: 12.2 s (ref 11.4–15.2)

## 2016-12-07 LAB — APTT: aPTT: 29 seconds (ref 24–36)

## 2016-12-07 MED ORDER — PIPERACILLIN-TAZOBACTAM 3.375 G IVPB
3.3750 g | INTRAVENOUS | Status: AC
  Administered 2016-12-07: 3.375 g via INTRAVENOUS

## 2016-12-07 MED ORDER — LIDOCAINE HCL 1 % IJ SOLN
INTRAMUSCULAR | Status: AC
  Filled 2016-12-07: qty 20

## 2016-12-07 MED ORDER — IOPAMIDOL (ISOVUE-300) INJECTION 61%
100.0000 mL | Freq: Once | INTRAVENOUS | Status: AC | PRN
  Administered 2016-12-07: 30 mL via INTRA_ARTERIAL
  Administered 2016-12-07: 12:00:00 via INTRA_ARTERIAL

## 2016-12-07 MED ORDER — IOPAMIDOL (ISOVUE-300) INJECTION 61%
INTRAVENOUS | Status: AC
  Administered 2016-12-07: 20 mL via INTRA_ARTERIAL
  Filled 2016-12-07: qty 50

## 2016-12-07 MED ORDER — SODIUM CHLORIDE 0.9 % IV SOLN
INTRAVENOUS | Status: DC
  Administered 2016-12-07: 09:00:00 via INTRAVENOUS

## 2016-12-07 MED ORDER — MIDAZOLAM HCL 2 MG/2ML IJ SOLN
INTRAMUSCULAR | Status: AC | PRN
  Administered 2016-12-07 (×3): 1 mg via INTRAVENOUS

## 2016-12-07 MED ORDER — MIDAZOLAM HCL 2 MG/2ML IJ SOLN
INTRAMUSCULAR | Status: AC
  Filled 2016-12-07: qty 8

## 2016-12-07 MED ORDER — PANTOPRAZOLE SODIUM 40 MG IV SOLR
40.0000 mg | INTRAVENOUS | Status: AC
  Administered 2016-12-07: 40 mg via INTRAVENOUS
  Filled 2016-12-07: qty 40

## 2016-12-07 MED ORDER — ONDANSETRON HCL 4 MG/2ML IJ SOLN
4.0000 mg | INTRAMUSCULAR | Status: AC
  Administered 2016-12-07: 4 mg via INTRAVENOUS
  Filled 2016-12-07: qty 2

## 2016-12-07 MED ORDER — ALTEPLASE 2 MG IJ SOLR
2.0000 mg | Freq: Once | INTRAMUSCULAR | Status: AC
  Administered 2016-12-07: 2 mg
  Filled 2016-12-07 (×2): qty 2

## 2016-12-07 MED ORDER — IOPAMIDOL (ISOVUE-300) INJECTION 61%
INTRAVENOUS | Status: AC
  Administered 2016-12-07: 30 mL via INTRA_ARTERIAL
  Filled 2016-12-07: qty 100

## 2016-12-07 MED ORDER — IOPAMIDOL (ISOVUE-300) INJECTION 61%
50.0000 mL | Freq: Once | INTRAVENOUS | Status: AC | PRN
  Administered 2016-12-07: 20 mL via INTRA_ARTERIAL

## 2016-12-07 MED ORDER — YTTRIUM 90 INJECTION
30.8000 | INJECTION | Freq: Once | INTRAVENOUS | Status: AC
  Administered 2016-12-07: 30.8 via INTRA_ARTERIAL

## 2016-12-07 MED ORDER — PIPERACILLIN-TAZOBACTAM 3.375 G IVPB
INTRAVENOUS | Status: AC
  Filled 2016-12-07: qty 50

## 2016-12-07 MED ORDER — HEPARIN SOD (PORK) LOCK FLUSH 100 UNIT/ML IV SOLN
500.0000 [IU] | INTRAVENOUS | Status: AC | PRN
Start: 2016-12-07 — End: 2016-12-07
  Administered 2016-12-07: 500 [IU]

## 2016-12-07 MED ORDER — KETOROLAC TROMETHAMINE 30 MG/ML IJ SOLN
30.0000 mg | INTRAMUSCULAR | Status: AC
  Administered 2016-12-07: 30 mg via INTRAVENOUS
  Filled 2016-12-07: qty 1

## 2016-12-07 MED ORDER — DEXAMETHASONE SODIUM PHOSPHATE 10 MG/ML IJ SOLN
20.0000 mg | INTRAMUSCULAR | Status: AC
  Administered 2016-12-07: 20 mg via INTRAVENOUS
  Filled 2016-12-07: qty 2

## 2016-12-07 MED ORDER — FENTANYL CITRATE (PF) 100 MCG/2ML IJ SOLN
INTRAMUSCULAR | Status: AC
  Filled 2016-12-07: qty 8

## 2016-12-07 MED ORDER — FENTANYL CITRATE (PF) 100 MCG/2ML IJ SOLN
INTRAMUSCULAR | Status: AC | PRN
  Administered 2016-12-07 (×2): 25 ug via INTRAVENOUS
  Administered 2016-12-07: 50 ug via INTRAVENOUS

## 2016-12-07 NOTE — Sedation Documentation (Signed)
Patient is resting comfortably. 

## 2016-12-07 NOTE — Procedures (Signed)
Right hepatic arterial Y90 radioembolization  No complication No blood loss. See complete dictation in [facility].   Dillard Cannon MD Main # [phone] Pager  [phone]

## 2016-12-07 NOTE — Telephone Encounter (Signed)
If the cancer center has difficulty access the port and getting a blood supply she can notify us and we will arrange for a dye study to confirm port patency.

## 2016-12-07 NOTE — Telephone Encounter (Signed)
Patient called to let you know that she had a procedure today ([facility]) and when the doctors went to draw blood from the port site, they could not get any blood. Patient did state to call her on her cell phone first and if not able to reach her there to try her home number.

## 2016-12-07 NOTE — Sedation Documentation (Signed)
NucMed MD at bedside  

## 2016-12-07 NOTE — Discharge Instructions (Signed)
Moderate Conscious Sedation, Adult, Care After These instructions provide you with information about caring for yourself after your procedure. Your health care provider may also give you more specific instructions. Your treatment has been planned according to current medical practices, but problems sometimes occur. Call your health care provider if you have any problems or questions after your procedure. What can I expect after the procedure? After your procedure, it is common:  To feel sleepy for several hours.  To feel clumsy and have poor balance for several hours.  To have poor judgment for several hours.  To vomit if you eat too soon.  Follow these instructions at home: For at least 24 hours after the procedure:   Do not: ? Participate in activities where you could fall or become injured. ? Drive. ? Use heavy machinery. ? Drink alcohol. ? Take sleeping pills or medicines that cause drowsiness. ? Make important decisions or sign legal documents. ? Take care of children on your own.  Rest. Eating and drinking  Follow the diet recommended by your health care provider.  If you vomit: ? Drink water, juice, or soup when you can drink without vomiting. ? Make sure you have little or no nausea before eating solid foods. General instructions  Have a responsible adult stay with you until you are awake and alert.  Take over-the-counter and prescription medicines only as told by your health care provider.  If you smoke, do not smoke without supervision.  Keep all follow-up visits as told by your health care provider. This is important. Contact a health care provider if:  You keep feeling nauseous or you keep vomiting.  You feel light-headed.  You develop a rash.  You have a fever. Get help right away if:  You have trouble breathing. This information is not intended to replace advice given to you by your health care provider. Make sure you discuss any questions you  have with your health care provider. Document Released: 03/28/2013 Document Revised: 11/10/2015 Document Reviewed: 09/27/2015 Elsevier Interactive Patient Education  2018 [employer].   Hepatic Artery Radioembolization, Care After This sheet gives you information about how to care for yourself after your procedure. Your health care provider may also give you more specific instructions. If you have problems or questions, contact your health care provider. What can I expect after the procedure? After the procedure, it is common to have:  A slight fever for 1-2 weeks. If your fever gets worse, tell your health care provider.  Fatigue.  Loss of appetite. This should gradually improve after about 1 week.  Abdominal pain on your right side.  Soreness and tenderness in your groin area where the needle and catheter were placed (puncture site).  Follow these instructions at home: Puncture site care  Follow instructions from your health care provider about how to take care of the puncture site. Make sure you: ? Wash your hands with soap and water before you change your bandage (dressing). If soap and water are not available, use hand sanitizer. ? Change your dressing as told by your health care provider. ? Leave stitches (sutures), skin glue, or adhesive strips in place. These skin closures may need to stay in place for 2 weeks or longer. If adhesive strip edges start to loosen and curl up, you may trim the loose edges. Do not remove adhesive strips completely unless your health care provider tells you to do that.  Check your puncture site every day for signs of infection. Check  for: ? More redness, swelling, or pain. ? More fluid or blood. ? Warmth. ? Pus or a bad smell. Activity  Rest and return to your normal activities as told by your health care provider. Ask your health care provider what activities are safe for you.  Do not drive for 24 hours after the procedure if you were given  a medicine to help you relax (sedative).  Do not lift anything that is heavier than 10 lb (4.5 kg) until your health care provider says that it is safe. Medicines  Take over-the-counter and prescription medicines only as told by your health care provider.  Do not drive or use heavy machinery while taking prescription pain medicine. Radiation precautions  For up to a week after your procedure, there will be a small amount of radioactivity near your liver. This is not especially dangerous to other people. However, you should follow these precautions for 7 days: ? Do not come in close contact with people. ? Do not sleep in the same bed as someone else. ? Do not hold children or babies. ? Do not have contact with pregnant women. General instructions   To prevent or treat constipation while you are taking prescription pain medicine, your health care provider may recommend that you: ? Drink enough fluid to keep your urine clear or pale yellow. ? Take over-the-counter or prescription medicines. ? Eat foods that are high in fiber, such as fresh fruits and vegetables, whole grains, and beans. ? Limit foods that are high in fat and processed sugars, such as fried and sweet foods.  Eat frequent small meals until your appetite returns. Follow instructions from your health care provider about eating or drinking restrictions.  Do not take baths, swim, or use a hot tub until your health care provider approves. You may take showers. Wash your puncture site with mild soap and water and pat the area dry.  Wear compression stockings as told by your health care provider. These stockings help to prevent blood clots and reduce swelling in your legs.  Keep all follow-up visits as told by your health care provider. This is important. You may need to have blood tests and imaging tests done. Contact a health care provider if:  You have more redness, swelling, or pain around your puncture site.  You have  more fluid or blood coming from your puncture site.  Your puncture site feels warm to the touch.  You have pus or a bad smell coming from your puncture site.  You have pain that: ? Gets worse. ? Does not get better with medicine. ? Feels like very bad heartburn. ? Is in the middle of your abdomen, above your belly button.  Your skin and the white parts of your eyes turn yellow (jaundice).  The color of your urine changes to dark brown.  The color of your stool changes to light yellow.  Your abdominal measurement (girth) increases in a short period of time.  You gain more than 5 lb (2.3 kg) in a short period of time. Get help right away if:  You have a fever that lasts longer than 2 weeks or is higher than what your health care provider told you to expect.  You develop any of the following in your legs: ? Pain. ? Swelling. ? Skin that is cold or pale or turns blue.  You have chest pain.  You have blood in your vomit, saliva, or stool.  You have trouble breathing. This information is not  intended to replace advice given to you by your health care provider. Make sure you discuss any questions you have with your health care provider. Document Released: 06/12/2013 Document Revised: 03/06/2016 Document Reviewed: 03/06/2016 Elsevier Interactive Patient Education  2018 [place] Radioembolization Discharge Instructions  You have been given a radioactive material during your procedure.  While it is safe for you to be discharged home from the hospital, you need to proceed directly home.    Do not use public transportation, including air travel, lasting more than 2 hours for 1 week.  Avoid crowded public places for 1 week.  Adult visitors should try to avoid close contact with you for 1 week.    Children and pregnant females should not visit or have close contact with you for 1 week.  Items that you touch are not radioactive.  Do not sleep in the same bed  as your partner for 1 week, and a condom should be used for sexual activity during the first 24 hours.  Your blood may be radioactive and caution should be used if any bleeding occurs during the recovery period.  Body fluids may be radioactive for 24 hours.  Wash your hands after voiding.  Men should sit to urinate.  Dispose of any soiled materials (flush down toilet or place in trash at home) during the first day.  Drink 6 to 8 glasses of fluids per day for 5 days to hydrate yourself.  If you need to see a doctor during the first week, you must let them know that you were treated with yttrium-90 microspheres, and will be slightly radioactive.  They can call Interventional Radiology [phone] with any questions.

## 2016-12-07 NOTE — H&P (Signed)
Referring Physician(s): [id number],O  Supervising Physician: Arne Cleveland  Patient Status:  [facility]  Chief Complaint:  Metastatic colon cancer to liver  Subjective: Patient familiar to IR service from prior right hepatic lobe lesion biopsy in 2017, consultation with Dr. Vernard Gambles on 04/07/16 and pre Y-90 hepatic roadmapping hepatic/visceral arteriography on 11/22/16. 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 Past Medical History:  Diagnosis Date  . Angiolipoma of kidney    followed with serial CTs ,,  Dr. Jacqlyn Larsen  . Anxiety   . Arthritis   . 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 Chemotherapy induced diarrhea   . Hammer toe   . Hypertension   . Liver cancer ([place])   . Neuro-endocrine carcinoma ([place])   . Obesity (BMI 30-39.9)    Past Surgical History:  Procedure Laterality Date  . ABDOMINAL HYSTERECTOMY  1995   endometriosis, heavy bleeding  . BUNIONECTOMY Bilateral   . CESAREAN SECTION  1985  . COLONOSCOPY WITH PROPOFOL N/A 10/09/2015   Procedure: COLONOSCOPY WITH PROPOFOL;  Surgeon: Robert Bellow, MD;  Location: [facility] ENDOSCOPY;  Service: Endoscopy;  Laterality: N/A;  . IR ANGIOGRAM SELECTIVE EACH ADDITIONAL VESSEL  11/22/2016  . IR ANGIOGRAM SELECTIVE EACH ADDITIONAL VESSEL  11/22/2016  . IR ANGIOGRAM SELECTIVE EACH ADDITIONAL VESSEL  11/22/2016  . IR ANGIOGRAM VISCERAL SELECTIVE  11/22/2016  . IR EMBO ARTERIAL NOT HEMORR HEMANG INC GUIDE ROADMAPPING  11/22/2016  . IR GENERIC HISTORICAL  04/07/2016   IR RADIOLOGIST EVAL & MGMT 04/07/2016 Arne Cleveland, MD [facility] INTERV RAD  . IR US GUIDE VASC ACCESS RIGHT  11/22/2016  . LAPAROSCOPIC RIGHT COLECTOMY Right 10/26/2016   Procedure: LAPAROSCOPIC RIGHT  COLECTOMY;  Surgeon: Robert Bellow, MD;  Location: [facility] ORS;  Service: General;  Laterality: Right;  . OOPHORECTOMY    . PORTACATH PLACEMENT Left 10/01/2015   Procedure: INSERTION PORT-A-CATH;  Surgeon: Robert Bellow, MD;  Location: [facility] ORS;  Service: General;  Laterality: Left;  . ROTATOR CUFF REPAIR Right 2008   right shoulder.  [place]       Allergies: Bee venom; Dilaudid [hydromorphone hcl]; and Morphine and related  Medications: Prior to Admission medications   Medication Sig Start Date End Date Taking? Authorizing Provider  ALPRAZolam (XANAX) 0.25 MG tablet TAKE 1 TABLET TWICE DAILY AS NEEDED FOR ANXIETY OR SLEEP 10/21/16  Yes [name], MD  amLODipine (NORVASC) 5 MG tablet TAKE ONE TABLET BY MOUTH EVERY DAY 11/19/16  Yes [name], MD  Biotin w/ Vitamins C & E (HAIR/SKIN/NAILS PO) Take 1 tablet by mouth daily.   Yes [provider]  Chlorpheniramine Maleate (CHLOR-TABLETS PO) Take 1 tablet by mouth every evening.   Yes [provider]  lidocaine-prilocaine (EMLA) cream Apply 1 application topically as needed. Apply to port a cath site 1 hour prior to chemotherapy treatments 08/30/16  Yes [name], MD  loperamide (IMODIUM A-D) 2 MG tablet Take 2 mg by mouth 4 (four) times daily as needed for diarrhea or loose stools.   Yes [provider]  naproxen sodium (ANAPROX) 220 MG tablet Take 440 mg by mouth 2 (two) times daily as needed.   Yes [provider]  olmesartan (BENICAR) 40 MG tablet Take 1 tablet (40 mg total) by  mouth daily. 04/30/16  Yes [name], MD  olmesartan (BENICAR) 40 MG tablet TAKE ONE TABLET BY MOUTH EVERY DAY 11/19/16  Yes [name], MD  potassium chloride (K-DUR) 10 MEQ tablet TAKE ONE TABLET BY MOUTH TWICE DAILY 10/19/16  Yes [name], MD  diphenoxylate-atropine (LOMOTIL) 2.5-0.025 MG tablet TAKE 1 TABLET FOUR TIMES DAILY AS NEEDEDFOR DIARRHEA OR LOOSE STOOLS 08/17/16   [name], MD  ondansetron (ZOFRAN) 8 MG tablet Take 1 tablet (8 mg total) by mouth every 6 (six) hours as needed for nausea or vomiting. 12/19/15   [name], MD  prochlorperazine (COMPAZINE) 10 MG tablet Take 1 tablet (10 mg total) by mouth every 6 (six) hours as needed for nausea or vomiting. 10/07/15   [name], MD     Vital Signs: BP 126/84 (BP Location: Right Arm)   Pulse 82   Temp 97.8 F (36.6 C) (Oral)   Resp 18   SpO2 100%   Physical Exam Awake, alert. Chest with few left basilar crackles, right clear. Heart with regular rate and rhythm. Abdomen soft, positive bowel sounds, nontender. No significant lower exemity edema, intact distal pulses.  Imaging: No results found.  Labs:  CBC:  Recent Labs  10/25/16 0819 11/04/16 1009 11/17/16 0829 11/22/16 0843  WBC 4.0 6.3 4.0 3.4*  HGB 12.3 12.5 11.6* 11.1*  HCT 34.5* 38.0 33.2* 34.0*  PLT 211 386* 318 259    COAGS:  Recent Labs  11/22/16 0843  INR 0.95    BMP:  Recent Labs  10/25/16 0819 11/04/16 1009 11/17/16 0829 11/22/16 0843  NA 137 138 139 137  K 3.9 4.6 3.8 4.4  CL 108 101 108 108  CO2 25 22 25 25   GLUCOSE 84 86 82 94  BUN 13 14 12 17   CALCIUM 9.3 9.8 9.2 8.9  CREATININE 0.97 0.98 0.95 0.90  GFRNONAA 60* 60 >60 >60  GFRAA >60 70 >60 >60    LIVER FUNCTION TESTS:  Recent Labs  10/25/16 0819 11/04/16 1009 11/17/16 0829 11/22/16 0843  BILITOT 0.5 0.3 0.5 0.4  AST 29 26 28 27   ALT 26 25 18 21   ALKPHOS 83 98 72 73  PROT 8.2* 7.9 7.5 7.4  ALBUMIN 3.9 4.2 3.8 3.7    Assessment and Plan: Patient with history of 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 Vernard Gambles on 04/07/16 and deemed an appropriate candidate for Y 90 hepatic radioembolization.She presents today for the procedure. Details/risks of procedure, including but not limited to, internal bleeding, infection, contrast nephropathy, nontarget embolization discussed with  patient and family with their understanding and consent. Labs pending.    Electronically Signed: D. Rowe Robert, PA-C 12/07/2016, 8:37 AM   I spent a total of 25 minutes at the the patient's bedside AND on the patient's hospital floor or unit, greater than 50% of which was counseling/coordinating care for hepatic/visceral arteriogram with Y 90 hepatic radioembolization

## 2016-12-07 NOTE — Sedation Documentation (Signed)
Patient is resting comfortably in a supine position.  

## 2016-12-08 ENCOUNTER — Telehealth: Payer: Self-pay | Admitting: *Deleted

## 2016-12-08 ENCOUNTER — Other Ambulatory Visit: Payer: Self-pay | Admitting: *Deleted

## 2016-12-08 DIAGNOSIS — T82599A Other mechanical complication of unspecified cardiac and vascular devices and implants, initial encounter: Secondary | ICD-10-CM

## 2016-12-08 NOTE — Telephone Encounter (Signed)
I talked with the patient and she wants to talk to Dr Bary Castilla more tomorrow at her follow up appointment. She did state that the IV team used TPA yesterday and that they still could not get blood, it flushes great. Next chemo is 12-29-16.

## 2016-12-08 NOTE — Telephone Encounter (Signed)
Patient notified and verbalizes understanding.

## 2016-12-08 NOTE — Telephone Encounter (Signed)
-----   Message from Robert Bellow, MD sent at 12/08/2016  4:16 PM EDT ----- Please arrange for the patient to have a PA CXR prior to tomorrow AM appointment: RE: Port malfunction. Thanks.

## 2016-12-09 ENCOUNTER — Ambulatory Visit
Admission: RE | Admit: 2016-12-09 | Discharge: 2016-12-09 | Disposition: A | Discharge status: Home / Self Care | Payer: Medicare HMO | Source: Ambulatory Visit | Attending: General Surgery | Admitting: General Surgery

## 2016-12-09 ENCOUNTER — Ambulatory Visit (INDEPENDENT_AMBULATORY_CARE_PROVIDER_SITE_OTHER): Payer: Medicare HMO | Admitting: General Surgery

## 2016-12-09 VITALS — BP 122/78 | HR 82 | Resp 12 | Ht 65.0 in | Wt 204.0 lb

## 2016-12-09 DIAGNOSIS — C189 Malignant neoplasm of colon, unspecified: Secondary | ICD-10-CM | POA: Diagnosis not present

## 2016-12-09 DIAGNOSIS — C799 Secondary malignant neoplasm of unspecified site: Secondary | ICD-10-CM

## 2016-12-09 DIAGNOSIS — Z95828 Presence of other vascular implants and grafts: Secondary | ICD-10-CM | POA: Diagnosis not present

## 2016-12-09 DIAGNOSIS — T82599A Other mechanical complication of unspecified cardiac and vascular devices and implants, initial encounter: Secondary | ICD-10-CM

## 2016-12-09 DIAGNOSIS — T82898A Other specified complication of vascular prosthetic devices, implants and grafts, initial encounter: Secondary | ICD-10-CM | POA: Diagnosis not present

## 2016-12-09 NOTE — Patient Instructions (Signed)
Return as needed

## 2016-12-09 NOTE — Progress Notes (Signed)
Patient ID: Kathleen James, female   DOB: 03/17/1951, 66 y.o.   MRN: [id number]  Chief Complaint  Patient presents with  . Follow-up    HPI Kathleen James is a 66 y.o. female  Here today for postoperative visit, right colectomy on 10-26-16, she states she is doing well. Patient had a right hepatic arterial Y90 radioembolization on 12/07/2016. Patient tolerated procedure well. HPI  Past Medical History:  Diagnosis Date  . Angiolipoma of kidney    followed with serial CTs ,,  Dr. Jacqlyn Larsen  . Anxiety   . Arthritis   . 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 Chemotherapy induced diarrhea   . Hammer toe   . Hypertension   . Liver cancer ([place])   . Neuro-endocrine carcinoma ([place])   . Obesity (BMI 30-39.9)     Past Surgical History:  Procedure Laterality Date  . ABDOMINAL HYSTERECTOMY  1995   endometriosis, heavy bleeding  . BUNIONECTOMY Bilateral   . CESAREAN SECTION  1985  . COLONOSCOPY WITH PROPOFOL N/A 10/09/2015   Procedure: COLONOSCOPY WITH PROPOFOL;  Surgeon: Robert Bellow, MD;  Location: [facility] ENDOSCOPY;  Service: Endoscopy;  Laterality: N/A;  . IR ANGIOGRAM SELECTIVE EACH ADDITIONAL VESSEL  11/22/2016  . IR ANGIOGRAM SELECTIVE EACH ADDITIONAL VESSEL  11/22/2016  . IR ANGIOGRAM SELECTIVE EACH ADDITIONAL VESSEL  11/22/2016  . IR ANGIOGRAM SELECTIVE EACH ADDITIONAL VESSEL  12/07/2016  . IR ANGIOGRAM SELECTIVE EACH ADDITIONAL VESSEL  12/07/2016  . IR ANGIOGRAM VISCERAL SELECTIVE  11/22/2016  . IR ANGIOGRAM VISCERAL SELECTIVE  12/07/2016  . IR EMBO ARTERIAL NOT HEMORR HEMANG INC GUIDE ROADMAPPING  11/22/2016  . IR EMBO TUMOR ORGAN ISCHEMIA INFARCT INC GUIDE ROADMAPPING  12/07/2016  . IR GENERIC HISTORICAL  04/07/2016   IR RADIOLOGIST EVAL & MGMT 04/07/2016 Arne Cleveland, MD [facility] INTERV RAD  . IR US GUIDE VASC ACCESS RIGHT  11/22/2016  . IR US GUIDE VASC ACCESS RIGHT  12/07/2016  . LAPAROSCOPIC RIGHT COLECTOMY Right 10/26/2016   Procedure: LAPAROSCOPIC RIGHT COLECTOMY;  Surgeon: Robert Bellow, MD;  Location: [facility] ORS;  Service: General;  Laterality: Right;  . OOPHORECTOMY    . PORTACATH PLACEMENT Left 10/01/2015   Procedure: INSERTION PORT-A-CATH;  Surgeon: Robert Bellow, MD;  Location: [facility] ORS;  Service: General;  Laterality: Left;  . ROTATOR CUFF REPAIR Right 2008   right shoulder.  [place]     Family History  Problem Relation Age of Onset  . Mental illness Mother 61       bipolar, dementia  . Cancer Maternal Aunt 70       breast ca  . Hypertension Father   . Stroke Father 42       deceased  . COPD Father   . Heart disease Sister        deceased    Social History Social History  Substance Use Topics  . Smoking status: Never Smoker  . Smokeless tobacco: Never Used  . Alcohol use 0.0 oz/week     Comment: occasionally    Allergies  Allergen Reactions  . Bee Venom   . Dilaudid [Hydromorphone Hcl] Nausea And Vomiting  . Morphine And Related Nausea Only    Current Outpatient Prescriptions  Medication Sig Dispense Refill  . ALPRAZolam (XANAX) 0.25 MG tablet TAKE 1 TABLET TWICE DAILY AS NEEDED FOR ANXIETY OR SLEEP 60 tablet 1  . amLODipine (NORVASC) 5 MG tablet TAKE ONE TABLET BY MOUTH EVERY DAY  30 tablet 2  . Biotin w/ Vitamins C & E (HAIR/SKIN/NAILS PO) Take 1 tablet by mouth daily.    . Chlorpheniramine Maleate (CHLOR-TABLETS PO) Take 1 tablet by mouth every evening.    . diphenoxylate-atropine (LOMOTIL) 2.5-0.025 MG tablet TAKE 1 TABLET FOUR TIMES DAILY AS NEEDEDFOR DIARRHEA OR LOOSE STOOLS 30 tablet 3  . lidocaine-prilocaine (EMLA) cream Apply 1 application topically as needed. Apply to port a cath site 1 hour prior to chemotherapy treatments 30 g 6  . loperamide (IMODIUM A-D) 2 MG tablet Take 2 mg by mouth 4 (four) times daily as needed for diarrhea or loose stools.    . naproxen sodium (ANAPROX) 220 MG tablet Take 440 mg by mouth 2 (two) times daily as needed.    Marland Kitchen olmesartan  (BENICAR) 40 MG tablet Take 1 tablet (40 mg total) by mouth daily. 30 tablet 2  . olmesartan (BENICAR) 40 MG tablet TAKE ONE TABLET BY MOUTH EVERY DAY 30 tablet 2  . ondansetron (ZOFRAN) 8 MG tablet Take 1 tablet (8 mg total) by mouth every 6 (six) hours as needed for nausea or vomiting. 30 tablet 3  . potassium chloride (K-DUR) 10 MEQ tablet TAKE ONE TABLET BY MOUTH TWICE DAILY 60 tablet 4  . prochlorperazine (COMPAZINE) 10 MG tablet Take 1 tablet (10 mg total) by mouth every 6 (six) hours as needed for nausea or vomiting. 30 tablet 6   No current facility-administered medications for this visit.    Facility-Administered Medications Ordered in Other Visits  Medication Dose Route Frequency Provider Last Rate Last Dose  . heparin lock flush 100 unit/mL  500 Units Intravenous Once Charlaine Dalton R, MD      . sodium chloride flush (NS) 0.9 % injection 10 mL  10 mL Intravenous PRN [name], MD   10 mL at 11/24/15 3557    Review of Systems Review of Systems  Constitutional: Negative.   Respiratory: Negative.   Cardiovascular: Negative.     Blood pressure 122/78, pulse 82, resp. rate 12, height 5\' 5"  (1.651 m), weight 204 lb (92.5 kg).  Physical Exam Physical Exam  Constitutional: She is oriented to person, place, and time. She appears well-developed and well-nourished.  Eyes: Conjunctivae are normal. No scleral icterus.  Neck: Neck supple.  Cardiovascular: Normal rate, regular rhythm and normal heart sounds.   Pulmonary/Chest: Effort normal and breath sounds normal.    Abdominal:    Lymphadenopathy:    She has no cervical adenopathy.  Neurological: She is alert and oriented to person, place, and time.  Skin: Skin is warm and dry.    Data Reviewed The patient reported access issues with her port with inability to withdraw blood.  The port site was prepped with ChloraPrep and accessed in standard fashion. It was easily aspirated with free flow of blood and then  irrigated with injectable saline.   Assessment    Doing well post right hemicolectomy.  Status post yttrium embolization of her liver tumor  No evidence of port dysfunction.    Plan         Patient to return as needed.   HPI, Physical Exam, Assessment and Plan have been scribed under the direction and in the presence of Hervey Ard, MD.  Gaspar Cola, CMA  I have completed the exam and reviewed the above documentation for accuracy and completeness.  I agree with the above.  Haematologist has been used and any errors in dictation or transcription are unintentional.  Hervey Ard,  M.D., F.A.C.S.   Robert Bellow 12/10/2016, 6:12 AM

## 2016-12-15 ENCOUNTER — Telehealth: Payer: Self-pay | Admitting: *Deleted

## 2016-12-15 NOTE — Telephone Encounter (Signed)
Dr. Valentina Lucks called and left vm for Dr. Rogue Bussing to return his phone call at [phone] "regarding new findings"

## 2016-12-29 ENCOUNTER — Inpatient Hospital Stay: Payer: Medicare HMO | Attending: Internal Medicine | Admitting: *Deleted

## 2016-12-29 ENCOUNTER — Inpatient Hospital Stay: Payer: Medicare HMO

## 2016-12-29 ENCOUNTER — Inpatient Hospital Stay (HOSPITAL_BASED_OUTPATIENT_CLINIC_OR_DEPARTMENT_OTHER): Payer: Medicare HMO | Admitting: Internal Medicine

## 2016-12-29 VITALS — BP 132/88 | HR 84 | Temp 96.0°F | Resp 16 | Wt 201.5 lb

## 2016-12-29 DIAGNOSIS — E669 Obesity, unspecified: Secondary | ICD-10-CM | POA: Insufficient documentation

## 2016-12-29 DIAGNOSIS — F419 Anxiety disorder, unspecified: Secondary | ICD-10-CM | POA: Diagnosis not present

## 2016-12-29 DIAGNOSIS — D1771 Benign lipomatous neoplasm of kidney: Secondary | ICD-10-CM | POA: Diagnosis not present

## 2016-12-29 DIAGNOSIS — R110 Nausea: Secondary | ICD-10-CM

## 2016-12-29 DIAGNOSIS — M129 Arthropathy, unspecified: Secondary | ICD-10-CM

## 2016-12-29 DIAGNOSIS — I10 Essential (primary) hypertension: Secondary | ICD-10-CM | POA: Insufficient documentation

## 2016-12-29 DIAGNOSIS — C787 Secondary malignant neoplasm of liver and intrahepatic bile duct: Secondary | ICD-10-CM

## 2016-12-29 DIAGNOSIS — R5383 Other fatigue: Secondary | ICD-10-CM | POA: Insufficient documentation

## 2016-12-29 DIAGNOSIS — R69 Illness, unspecified: Secondary | ICD-10-CM | POA: Diagnosis not present

## 2016-12-29 DIAGNOSIS — Z5111 Encounter for antineoplastic chemotherapy: Secondary | ICD-10-CM | POA: Insufficient documentation

## 2016-12-29 DIAGNOSIS — C182 Malignant neoplasm of ascending colon: Secondary | ICD-10-CM

## 2016-12-29 DIAGNOSIS — R300 Dysuria: Secondary | ICD-10-CM

## 2016-12-29 DIAGNOSIS — C799 Secondary malignant neoplasm of unspecified site: Secondary | ICD-10-CM

## 2016-12-29 DIAGNOSIS — Z79899 Other long term (current) drug therapy: Secondary | ICD-10-CM

## 2016-12-29 DIAGNOSIS — C189 Malignant neoplasm of colon, unspecified: Secondary | ICD-10-CM

## 2016-12-29 LAB — CBC WITH DIFFERENTIAL/PLATELET
Basophils Absolute: 0 10*3/uL (ref 0–0.1)
Basophils Relative: 1 %
Eosinophils Absolute: 0.1 10*3/uL (ref 0–0.7)
Eosinophils Relative: 3 %
HEMATOCRIT: 33.7 % — AB (ref 35.0–47.0)
Hemoglobin: 12 g/dL (ref 12.0–16.0)
LYMPHS ABS: 0.5 10*3/uL — AB (ref 1.0–3.6)
LYMPHS PCT: 10 %
MCH: 35.9 pg — AB (ref 26.0–34.0)
MCHC: 35.4 g/dL (ref 32.0–36.0)
MCV: 101.4 fL — AB (ref 80.0–100.0)
MONOS PCT: 9 %
Monocytes Absolute: 0.4 10*3/uL (ref 0.2–0.9)
NEUTROS ABS: 3.5 10*3/uL (ref 1.4–6.5)
Neutrophils Relative %: 77 %
Platelets: 236 10*3/uL (ref 150–440)
RBC: 3.33 MIL/uL — ABNORMAL LOW (ref 3.80–5.20)
RDW: 14.3 % (ref 11.5–14.5)
WBC: 4.6 10*3/uL (ref 3.6–11.0)

## 2016-12-29 LAB — COMPREHENSIVE METABOLIC PANEL
ALT: 24 U/L (ref 14–54)
ANION GAP: 8 (ref 5–15)
AST: 32 U/L (ref 15–41)
Albumin: 3.7 g/dL (ref 3.5–5.0)
Alkaline Phosphatase: 95 U/L (ref 38–126)
BILIRUBIN TOTAL: 0.5 mg/dL (ref 0.3–1.2)
BUN: 17 mg/dL (ref 6–20)
CALCIUM: 8.9 mg/dL (ref 8.9–10.3)
CO2: 23 mmol/L (ref 22–32)
Chloride: 105 mmol/L (ref 101–111)
Creatinine, Ser: 0.96 mg/dL (ref 0.44–1.00)
GFR calc Af Amer: >60 mL/min (ref >60)
Glucose, Bld: 99 mg/dL (ref 65–99)
POTASSIUM: 3.7 mmol/L (ref 3.5–5.1)
Sodium: 136 mmol/L (ref 135–145)
Total Protein: 7.8 g/dL (ref 6.5–8.1)

## 2016-12-29 LAB — URINALYSIS, COMPLETE (UACMP) WITH MICROSCOPIC
Bilirubin Urine: NEGATIVE
GLUCOSE, UA: NEGATIVE mg/dL
Ketones, ur: NEGATIVE mg/dL
Nitrite: NEGATIVE
Protein, ur: 100 mg/dL — AB
SPECIFIC GRAVITY, URINE: 1.021 (ref 1.005–1.030)
pH: 5 (ref 5.0–8.0)

## 2016-12-29 MED ORDER — HEPARIN SOD (PORK) LOCK FLUSH 100 UNIT/ML IV SOLN: 500.0000 [IU] | Freq: Once | INTRAVENOUS | Status: DC | PRN

## 2016-12-29 MED ORDER — LORAZEPAM 2 MG/ML IJ SOLN
1.0000 mg | Freq: Once | INTRAMUSCULAR | Status: AC
  Administered 2016-12-29: 1 mg via INTRAVENOUS

## 2016-12-29 MED ORDER — DEXTROSE 5 % IV SOLN
85.0000 mg/m2 | Freq: Once | INTRAVENOUS | Status: AC
  Administered 2016-12-29: 170 mg via INTRAVENOUS
  Filled 2016-12-29: qty 34

## 2016-12-29 MED ORDER — SODIUM CHLORIDE 0.9 % IV SOLN
Freq: Once | INTRAVENOUS | Status: AC
  Administered 2016-12-29: 12:00:00 via INTRAVENOUS
  Filled 2016-12-29: qty 5

## 2016-12-29 MED ORDER — DEXTROSE 5 % IV SOLN
800.0000 mg | Freq: Once | INTRAVENOUS | Status: AC
  Administered 2016-12-29: 800 mg via INTRAVENOUS
  Filled 2016-12-29: qty 25

## 2016-12-29 MED ORDER — SODIUM CHLORIDE 0.9% FLUSH
10.0000 mL | INTRAVENOUS | Status: DC | PRN
  Filled 2016-12-29: qty 10

## 2016-12-29 MED ORDER — SODIUM CHLORIDE 0.9 % IV SOLN
2400.0000 mg/m2 | INTRAVENOUS | Status: DC
  Administered 2016-12-29: 4850 mg via INTRAVENOUS
  Filled 2016-12-29: qty 97

## 2016-12-29 MED ORDER — PALONOSETRON HCL INJECTION 0.25 MG/5ML
0.2500 mg | Freq: Once | INTRAVENOUS | Status: AC
  Administered 2016-12-29: 0.25 mg via INTRAVENOUS

## 2016-12-29 MED ORDER — DEXTROSE 5 % IV SOLN
Freq: Once | INTRAVENOUS | Status: AC
Start: 2016-12-29 — End: 2016-12-29
  Administered 2016-12-29: 12:00:00 via INTRAVENOUS
  Filled 2016-12-29: qty 1000

## 2016-12-29 MED ORDER — LORAZEPAM 2 MG/ML IJ SOLN
INTRAMUSCULAR | Status: AC
  Filled 2016-12-29: qty 1

## 2016-12-29 NOTE — Progress Notes (Signed)
Pt finished with oxalipatin, started complaing of nausea. Per Dr Rogue Bussing give 1mg  Ativan IV

## 2016-12-29 NOTE — Progress Notes (Signed)
[place] NOTE  [name], [name], MD (Internal Medicine) Bary Castilla, Forest Gleason, [name], RN as Registered Nurse [name], MD as Consulting Physician (Internal Medicine)  CHIEF COMPLAINTS/PURPOSE OF CONSULTATION:   Oncology History   # MARCH/APRIL 2017-NEUROENDOCRINE CA STAGE IV; [name]; [name]; [name]; [name]; [name]; [name]; [name]; July 14th  2017 CT- PR  # AUG 28th- FOLFIRI +Avastin; OCT 5th CT scan- PR.   # MARCH 20th- CT STABLE liver lesions; ? Enlarging cecal lesion [CEA rising]; March 26th- FOLFOX [last avastin march 12th 2018];   # resection of primary tumor [Dr.Byrnett]; y90- June 19th 2018.  # July 11th-FOLFOX   # [facility]- B-RAF [id number]- MUTATED [May 6734]  # right kidney lesion 2.6 x 1.6 cm ? Angiolipoma [followed by Urology in past]     Malignant neoplasm of ascending colon ([place])     HISTORY OF PRESENTING ILLNESS:  Kathleen James 66 y.o.  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   In the interim patient underwent surgery for her primary cecal malignancy. She has had an uncomplicated recovery so far.  Patient also underwent Y 90 radial embolization to the liver lesions on June 19. Recovery has been uneventful. Patient complains of mild intermittent nausea no vomiting  Denies any significant diarrhea. She denies any constipation. Appetite is good. No pain. No swelling of the legs. Denies any tingling or numbness. No shortness of breath or chest pain. No cough.  ROS: A complete 10 point review of  system is done which is negative except mentioned above in history of present illness  MEDICAL HISTORY:  Past Medical History:  Diagnosis Date  . Angiolipoma of kidney    followed with serial CTs ,,  Dr. Jacqlyn Larsen  . Anxiety   . Arthritis   . 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 Chemotherapy induced diarrhea   . Hammer toe   . Hypertension   . Liver cancer ([place])   . Neuro-endocrine carcinoma ([place])   . Obesity (BMI 30-39.9)     SURGICAL HISTORY: Past Surgical History:  Procedure Laterality Date  . ABDOMINAL HYSTERECTOMY  1995   endometriosis, heavy bleeding  . BUNIONECTOMY Bilateral   . CESAREAN SECTION  1985  . COLONOSCOPY WITH PROPOFOL N/A 10/09/2015   Procedure: COLONOSCOPY WITH PROPOFOL;  Surgeon: Robert Bellow, MD;  Location: [facility] ENDOSCOPY;  Service: Endoscopy;  Laterality: N/A;  . IR ANGIOGRAM SELECTIVE EACH ADDITIONAL VESSEL  11/22/2016  . IR ANGIOGRAM SELECTIVE EACH ADDITIONAL VESSEL  11/22/2016  . IR ANGIOGRAM SELECTIVE EACH ADDITIONAL VESSEL  11/22/2016  . IR ANGIOGRAM SELECTIVE EACH ADDITIONAL VESSEL  12/07/2016  . IR ANGIOGRAM SELECTIVE EACH ADDITIONAL VESSEL  12/07/2016  . IR ANGIOGRAM VISCERAL SELECTIVE  11/22/2016  . IR ANGIOGRAM VISCERAL SELECTIVE  12/07/2016  . IR EMBO ARTERIAL NOT HEMORR HEMANG INC GUIDE ROADMAPPING  11/22/2016  . IR EMBO TUMOR ORGAN ISCHEMIA INFARCT INC GUIDE ROADMAPPING  12/07/2016  . IR GENERIC HISTORICAL  04/07/2016   IR RADIOLOGIST EVAL & MGMT 04/07/2016 Arne Cleveland, MD [facility] INTERV  RAD  . IR US GUIDE VASC ACCESS RIGHT  11/22/2016  . IR US GUIDE VASC ACCESS RIGHT  12/07/2016  . LAPAROSCOPIC RIGHT COLECTOMY Right 10/26/2016   Procedure: LAPAROSCOPIC RIGHT COLECTOMY;  Surgeon: Robert Bellow, MD;  Location: [facility] ORS;  Service: General;  Laterality: Right;  . OOPHORECTOMY    . PORTACATH PLACEMENT Left 10/01/2015   Procedure: INSERTION PORT-A-CATH;  Surgeon: Robert Bellow, MD;  Location:  [facility] ORS;  Service: General;  Laterality: Left;  . ROTATOR CUFF REPAIR Right 2008   right shoulder.  [place]     SOCIAL HISTORY: She lives at home with her family in [place]. 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 Social History   Social History  . Marital status: Married    Spouse name: N/A  . Number of children: N/A  . Years of education: N/A   Occupational History  . Not on file.   Social History Main Topics  . Smoking status: Never Smoker  . Smokeless tobacco: Never Used  . Alcohol use 0.0 oz/week     Comment: occasionally  . Drug use: No  . Sexual activity: Not Currently   Other Topics Concern  . Not on file   Social History Narrative  . No narrative on file    FAMILY HISTORY: Family History  Problem Relation Age of Onset  . Mental illness Mother 39       bipolar, dementia  . Cancer Maternal Aunt 70       breast ca  . Hypertension Father   . Stroke Father 27       deceased  . COPD Father   . Heart disease Sister        deceased    ALLERGIES:  is allergic to bee venom; dilaudid [hydromorphone hcl]; and morphine and related.  MEDICATIONS:  Current Outpatient Prescriptions  Medication Sig Dispense Refill  . ALPRAZolam (XANAX) 0.25 MG tablet TAKE 1 TABLET TWICE DAILY AS NEEDED FOR ANXIETY OR SLEEP 60 tablet 1  . amLODipine (NORVASC) 5 MG tablet TAKE ONE TABLET BY MOUTH EVERY DAY 30 tablet 2  . Biotin w/ Vitamins C & E (HAIR/SKIN/NAILS PO) Take 1 tablet by mouth daily.    . Chlorpheniramine Maleate (CHLOR-TABLETS PO) Take 1 tablet by mouth every evening.    . diphenoxylate-atropine (LOMOTIL) 2.5-0.025 MG tablet TAKE 1 TABLET FOUR TIMES DAILY AS NEEDEDFOR DIARRHEA OR LOOSE STOOLS 30 tablet 3  . lidocaine-prilocaine (EMLA) cream Apply 1 application topically as needed. Apply to port a cath site 1 hour prior to chemotherapy treatments 30 g 6  . loperamide (IMODIUM A-D) 2 MG tablet Take 2 mg by mouth 4 (four) times  daily as needed for diarrhea or loose stools.    . naproxen sodium (ANAPROX) 220 MG tablet Take 440 mg by mouth 2 (two) times daily as needed.    Marland Kitchen olmesartan (BENICAR) 40 MG tablet Take 1 tablet (40 mg total) by mouth daily. 30 tablet 2  . olmesartan (BENICAR) 40 MG tablet TAKE ONE TABLET BY MOUTH EVERY DAY 30 tablet 2  . ondansetron (ZOFRAN) 8 MG tablet Take 1 tablet (8 mg total) by mouth every 6 (six) hours as needed for nausea or vomiting. 30 tablet 3  . potassium chloride (K-DUR) 10 MEQ tablet TAKE ONE TABLET BY MOUTH TWICE DAILY 60 tablet 4  . prochlorperazine (COMPAZINE) 10 MG tablet Take 1 tablet (10 mg total) by mouth every 6 (six) hours  as needed for nausea or vomiting. 30 tablet 6   No current facility-administered medications for this visit.    Facility-Administered Medications Ordered in Other Visits  Medication Dose Route Frequency Provider Last Rate Last Dose  . fluorouracil (ADRUCIL) 4,850 mg in sodium chloride 0.9 % 53 mL chemo infusion  2,400 mg/m2 (Treatment Plan Recorded) Intravenous 1 day or 1 dose Charlaine Dalton R, MD      . heparin lock flush 100 unit/mL  500 Units Intravenous Once Charlaine Dalton R, MD      . heparin lock flush 100 unit/mL  500 Units Intracatheter Once PRN Charlaine Dalton R, MD      . leucovorin 800 mg in dextrose 5 % 250 mL infusion  800 mg Intravenous Once [name], MD 145 mL/hr at 12/29/16 1238 800 mg at 12/29/16 1238  . oxaliplatin (ELOXATIN) 170 mg in dextrose 5 % 500 mL chemo infusion  85 mg/m2 (Treatment Plan Recorded) Intravenous Once [name], MD 267 mL/hr at 12/29/16 1239 170 mg at 12/29/16 1239  . sodium chloride flush (NS) 0.9 % injection 10 mL  10 mL Intravenous PRN [name], MD   10 mL at 11/24/15 0836  . sodium chloride flush (NS) 0.9 % injection 10 mL  10 mL Intracatheter PRN [name], MD          .  PHYSICAL EXAMINATION: ECOG PERFORMANCE STATUS: 0 -  Asymptomatic  Vitals:   12/29/16 1036  BP: 132/88  Pulse: 84  Resp: 16  Temp: (!) 96 F (35.6 C)   Filed Weights   12/29/16 1036  Weight: 201 lb 8 oz (91.4 kg)    GENERAL: Well-nourished well-developed; Alert, no distress and comfortable. She is with daughter.  EYES: no pallor or icterus OROPHARYNX: no thrush or ulceration; good dentition  NECK: supple, no masses felt LYMPH:  no palpable lymphadenopathy in the cervical, axillary or inguinal regions LUNGS: clear to auscultation and  No wheeze or crackles HEART/CVS: regular rate & rhythm and no murmurs; No lower extremity edema ABDOMEN: abdomen soft, non-tender and normal bowel sounds; positive for hepatomegaly. Musculoskeletal:no cyanosis of digits and no clubbing  PSYCH: alert & oriented x 3 with fluent speech NEURO: no focal motor/sensory deficits SKIN:  no rashes or significant lesions; Mediport is in place.   LABORATORY DATA:  I have reviewed the data as listed Lab Results  Component Value Date   WBC 4.6 12/29/2016   HGB 12.0 12/29/2016   HCT 33.7 (L) 12/29/2016   MCV 101.4 (H) 12/29/2016   PLT 236 12/29/2016    Recent Labs  11/22/16 0843 12/07/16 0855 12/29/16 0945  NA 137 139 136  K 4.4 4.2 3.7  CL 108 108 105  CO2 25 23 23   GLUCOSE 94 89 99  BUN 17 17 17   CREATININE 0.90 0.92 0.96  CALCIUM 8.9 9.3 8.9  GFRNONAA >60 >60 >60  GFRAA >60 >60 >60  PROT 7.4 8.2* 7.8  ALBUMIN 3.7 4.1 3.7  AST 27 25 32  ALT 21 19 24   ALKPHOS 73 80 95  BILITOT 0.4 0.4 0.5    RADIOGRAPHIC STUDIES: I have personally reviewed the radiological images as listed and agreed with the findings in the report. Dg Chest 1 View  Result Date: 12/09/2016 CLINICAL DATA:  Colon carcinoma.  Malfunctioning port catheter EXAM: CHEST 1 VIEW COMPARISON:  October 01, 2015 FINDINGS: Port-A-Cath tip is in superior vena cava. No pneumothorax. Lungs clear. Heart size and pulmonary vascular normal.  No adenopathy. No bone lesions. IMPRESSION:  Port-A-Cath tip in superior vena cava. No edema or consolidation. No mass or adenopathy evident. Electronically Signed   By: Lowella Grip III M.D.   On: 12/09/2016 09:24   Nm Liver Img Spect  Result Date: 12/07/2016 CLINICAL DATA:  Metastatic colorectal carcinoma. Unresectable liver metastasis. Yttrium 90 radio embolization RIGHT hepatic lobe. First therapy. EXAM: NUCLEAR MEDICINE SPECIAL MED RAD PHYSICS CONS; NUCLEAR MEDICINE RADIO PHARM THERAPY INTRA ARTERIAL; NUCLEAR MEDICINE TREATMENT PROCEDURE; NUCLEAR MEDICINE LIVER SCAN TECHNIQUE: In conjunction with the interventional radiologist a Y- Microsphere dose was calculated utilizing body surface area formulation. Calculated dose equal 31.4 mCi. Pre therapy MAA liver SPECT scan and CTA were evaluated. Utilizing a microcatheter system, the hepatic artery was selected and Y-90 microspheres were delivered in fractionated aliquots. Radiopharmaceutical was delivered by the interventional radiologist and nuclear radiologist. The patient tolerated procedure well. No adverse effects were noted. Bremsstrahlung planar and SPECT imaging of the abdomen following intrahepatic arterial delivery of Y-90 microsphere was performed. RADIOPHARMACEUTICALS:  01.7 millicuries yttrium 90 microspheres COMPARISON:  MAA scan 11/22/2016, CT scan 11/29/2016 FINDINGS: Y - 90 microspheres therapy as above. First therapy the right hepatic lobe. Bremsstrahlung planar and SPECT imaging of the abdomen following intrahepatic arterial delivery of Y-81microsphere demonstrates radioactivity localized to the RIGHT hepatic lobe. No evidence of extrahepatic activity. IMPRESSION: Successful Y - 90 microsphere delivery for treatment of unresectable liver metastasis. First therapy to the RIGHT lobe. Bremssstrahlung scan demonstrates activity localized to RIGHT hepatic lobe with no extrahepatic activity identified. Electronically Signed   By: Suzy Bouchard M.D.   On: 12/07/2016 12:30   Ir  Angiogram Visceral Selective  Result Date: 12/07/2016 CLINICAL DATA:  Metastatic MIXED ADENONEUROENDOCRINE CARCINOMA of the colon to the liver. See previous consultation report. EXAM: ULTRASOUND GUIDANCE FOR VASCULAR ACCESS SELECTIVE RIGHT HEPATIC ARTERY PERIPHERAL CATHETERIZATION, PRETREATMENT ANGIOGRAM, Y 90 RADIOEMBOLIZATION THERAPY AND POST EMBOLIZATION ANGIOGRAMS TECHNIQUE: Informed consent was obtained from the patient following explanation of the procedure, risks, benefits and alternatives. The patient understands, agrees and consents for the procedure. All questions were addressed. A time out was performed. As antibiotic prophylaxis, Zosyn 3.375 g was ordered pre-procedure and administered intravenously within one hour of incision. Maximal barrier sterile technique utilized including caps, mask, sterile gowns, sterile gloves, large sterile drape, hand hygiene, and betadine. Intravenous Fentanyl and Versed were administered as conscious sedation during continuous monitoring of the patient's level of consciousness and physiological / cardiorespiratory status by the radiology RN, with a total moderate sedation time of 43 minutes. Under sterile conditions and local anesthesia, a 21 gauge micropuncture set ultrasound guided access performed of the right common femoral artery. Over a a Benson guide wire, 5-French sheath was inserted. A 5- [place] Chung catheter was utilized to select the celiac origin. Arteriogram performed. The catheter was advanced into the common hepatic artery with the aid of an angled roadrunner hydrophilic guidewire. Arteriogram performed, confirming durable coil occlusion of the GDA. A coaxial high-flow micro catheter was advanced over a 0.016 guidewire into the right hepatic artery. Selective hepatic pretreatment angiogram was performed. This confirmed good antegrade flow into distal hepatic branches. From this location, the Y 90 dose which had been calculated by the nuclear medicine  physician was instilled into the right hepatic artery. Following embolization, a post embolization angiogram confirms preserved patency and antegrade flow in the right hepatic artery. The micro catheter and a 5-F catheter were removed safely. Hemostasis was obtained at the right common femoral artery access site  with a 5 [place] Exoseal device after confirmatory femoral arteriogram. No immediate complication. Distal pedal pulses remained normal. The patient tolerated the treatment well. COMPLICATIONS: None immediate FLUOROSCOPY TIME:  3.9 minutes, 742 uGym2 DAP IMPRESSION: 1. Technically successful right hepatic artery Y 90 radioembolization for hepatic adenoneuroendocrine colon carcinoma metastases. A separate nuclear medicine report will be generated to document dosing and post treatment Bremsstrahlung scan. Electronically Signed   By: Lucrezia Europe M.D.   On: 12/07/2016 13:03   Ir Angiogram Selective Each Additional Vessel  Result Date: 12/07/2016 CLINICAL DATA:  Metastatic MIXED ADENONEUROENDOCRINE CARCINOMA of the colon to the liver. See previous consultation report. EXAM: ULTRASOUND GUIDANCE FOR VASCULAR ACCESS SELECTIVE RIGHT HEPATIC ARTERY PERIPHERAL CATHETERIZATION, PRETREATMENT ANGIOGRAM, Y 90 RADIOEMBOLIZATION THERAPY AND POST EMBOLIZATION ANGIOGRAMS TECHNIQUE: Informed consent was obtained from the patient following explanation of the procedure, risks, benefits and alternatives. The patient understands, agrees and consents for the procedure. All questions were addressed. A time out was performed. As antibiotic prophylaxis, Zosyn 3.375 g was ordered pre-procedure and administered intravenously within one hour of incision. Maximal barrier sterile technique utilized including caps, mask, sterile gowns, sterile gloves, large sterile drape, hand hygiene, and betadine. Intravenous Fentanyl and Versed were administered as conscious sedation during continuous monitoring of the patient's level of consciousness  and physiological / cardiorespiratory status by the radiology RN, with a total moderate sedation time of 43 minutes. Under sterile conditions and local anesthesia, a 21 gauge micropuncture set ultrasound guided access performed of the right common femoral artery. Over a a Benson guide wire, 5-French sheath was inserted. A 5- [place] Chung catheter was utilized to select the celiac origin. Arteriogram performed. The catheter was advanced into the common hepatic artery with the aid of an angled roadrunner hydrophilic guidewire. Arteriogram performed, confirming durable coil occlusion of the GDA. A coaxial high-flow micro catheter was advanced over a 0.016 guidewire into the right hepatic artery. Selective hepatic pretreatment angiogram was performed. This confirmed good antegrade flow into distal hepatic branches. From this location, the Y 90 dose which had been calculated by the nuclear medicine physician was instilled into the right hepatic artery. Following embolization, a post embolization angiogram confirms preserved patency and antegrade flow in the right hepatic artery. The micro catheter and a 5-F catheter were removed safely. Hemostasis was obtained at the right common femoral artery access site with a 5 [place] Exoseal device after confirmatory femoral arteriogram. No immediate complication. Distal pedal pulses remained normal. The patient tolerated the treatment well. COMPLICATIONS: None immediate FLUOROSCOPY TIME:  3.9 minutes, 742 uGym2 DAP IMPRESSION: 1. Technically successful right hepatic artery Y 90 radioembolization for hepatic adenoneuroendocrine colon carcinoma metastases. A separate nuclear medicine report will be generated to document dosing and post treatment Bremsstrahlung scan. Electronically Signed   By: Lucrezia Europe M.D.   On: 12/07/2016 13:03   Ir Angiogram Selective Each Additional Vessel  Result Date: 12/07/2016 CLINICAL DATA:  Metastatic MIXED ADENONEUROENDOCRINE CARCINOMA of the colon  to the liver. See previous consultation report. EXAM: ULTRASOUND GUIDANCE FOR VASCULAR ACCESS SELECTIVE RIGHT HEPATIC ARTERY PERIPHERAL CATHETERIZATION, PRETREATMENT ANGIOGRAM, Y 90 RADIOEMBOLIZATION THERAPY AND POST EMBOLIZATION ANGIOGRAMS TECHNIQUE: Informed consent was obtained from the patient following explanation of the procedure, risks, benefits and alternatives. The patient understands, agrees and consents for the procedure. All questions were addressed. A time out was performed. As antibiotic prophylaxis, Zosyn 3.375 g was ordered pre-procedure and administered intravenously within one hour of incision. Maximal barrier sterile technique utilized including caps, mask, sterile gowns, sterile  gloves, large sterile drape, hand hygiene, and betadine. Intravenous Fentanyl and Versed were administered as conscious sedation during continuous monitoring of the patient's level of consciousness and physiological / cardiorespiratory status by the radiology RN, with a total moderate sedation time of 43 minutes. Under sterile conditions and local anesthesia, a 21 gauge micropuncture set ultrasound guided access performed of the right common femoral artery. Over a a Benson guide wire, 5-French sheath was inserted. A 5- [place] Chung catheter was utilized to select the celiac origin. Arteriogram performed. The catheter was advanced into the common hepatic artery with the aid of an angled roadrunner hydrophilic guidewire. Arteriogram performed, confirming durable coil occlusion of the GDA. A coaxial high-flow micro catheter was advanced over a 0.016 guidewire into the right hepatic artery. Selective hepatic pretreatment angiogram was performed. This confirmed good antegrade flow into distal hepatic branches. From this location, the Y 90 dose which had been calculated by the nuclear medicine physician was instilled into the right hepatic artery. Following embolization, a post embolization angiogram confirms preserved patency  and antegrade flow in the right hepatic artery. The micro catheter and a 5-F catheter were removed safely. Hemostasis was obtained at the right common femoral artery access site with a 5 [place] Exoseal device after confirmatory femoral arteriogram. No immediate complication. Distal pedal pulses remained normal. The patient tolerated the treatment well. COMPLICATIONS: None immediate FLUOROSCOPY TIME:  3.9 minutes, 742 uGym2 DAP IMPRESSION: 1. Technically successful right hepatic artery Y 90 radioembolization for hepatic adenoneuroendocrine colon carcinoma metastases. A separate nuclear medicine report will be generated to document dosing and post treatment Bremsstrahlung scan. Electronically Signed   By: Lucrezia Europe M.D.   On: 12/07/2016 13:03   Nm Special Med Rad Physics Cons  Result Date: 12/07/2016 CLINICAL DATA:  Metastatic colorectal carcinoma. Unresectable liver metastasis. Yttrium 90 radio embolization RIGHT hepatic lobe. First therapy. EXAM: NUCLEAR MEDICINE SPECIAL MED RAD PHYSICS CONS; NUCLEAR MEDICINE RADIO PHARM THERAPY INTRA ARTERIAL; NUCLEAR MEDICINE TREATMENT PROCEDURE; NUCLEAR MEDICINE LIVER SCAN TECHNIQUE: In conjunction with the interventional radiologist a Y- Microsphere dose was calculated utilizing body surface area formulation. Calculated dose equal 31.4 mCi. Pre therapy MAA liver SPECT scan and CTA were evaluated. Utilizing a microcatheter system, the hepatic artery was selected and Y-90 microspheres were delivered in fractionated aliquots. Radiopharmaceutical was delivered by the interventional radiologist and nuclear radiologist. The patient tolerated procedure well. No adverse effects were noted. Bremsstrahlung planar and SPECT imaging of the abdomen following intrahepatic arterial delivery of Y-90 microsphere was performed. RADIOPHARMACEUTICALS:  74.0 millicuries yttrium 90 microspheres COMPARISON:  MAA scan 11/22/2016, CT scan 11/29/2016 FINDINGS: Y - 90 microspheres therapy as above.  First therapy the right hepatic lobe. Bremsstrahlung planar and SPECT imaging of the abdomen following intrahepatic arterial delivery of Y-62microsphere demonstrates radioactivity localized to the RIGHT hepatic lobe. No evidence of extrahepatic activity. IMPRESSION: Successful Y - 90 microsphere delivery for treatment of unresectable liver metastasis. First therapy to the RIGHT lobe. Bremssstrahlung scan demonstrates activity localized to RIGHT hepatic lobe with no extrahepatic activity identified. Electronically Signed   By: Suzy Bouchard M.D.   On: 12/07/2016 12:30   Nm Special Treatment Procedure  Result Date: 12/07/2016 CLINICAL DATA:  Metastatic colorectal carcinoma. Unresectable liver metastasis. Yttrium 90 radio embolization RIGHT hepatic lobe. First therapy. EXAM: NUCLEAR MEDICINE SPECIAL MED RAD PHYSICS CONS; NUCLEAR MEDICINE RADIO PHARM THERAPY INTRA ARTERIAL; NUCLEAR MEDICINE TREATMENT PROCEDURE; NUCLEAR MEDICINE LIVER SCAN TECHNIQUE: In conjunction with the interventional radiologist a Y- Microsphere dose was calculated utilizing body  surface area formulation. Calculated dose equal 31.4 mCi. Pre therapy MAA liver SPECT scan and CTA were evaluated. Utilizing a microcatheter system, the hepatic artery was selected and Y-90 microspheres were delivered in fractionated aliquots. Radiopharmaceutical was delivered by the interventional radiologist and nuclear radiologist. The patient tolerated procedure well. No adverse effects were noted. Bremsstrahlung planar and SPECT imaging of the abdomen following intrahepatic arterial delivery of Y-90 microsphere was performed. RADIOPHARMACEUTICALS:  02.5 millicuries yttrium 90 microspheres COMPARISON:  MAA scan 11/22/2016, CT scan 11/29/2016 FINDINGS: Y - 90 microspheres therapy as above. First therapy the right hepatic lobe. Bremsstrahlung planar and SPECT imaging of the abdomen following intrahepatic arterial delivery of Y-69microsphere demonstrates  radioactivity localized to the RIGHT hepatic lobe. No evidence of extrahepatic activity. IMPRESSION: Successful Y - 90 microsphere delivery for treatment of unresectable liver metastasis. First therapy to the RIGHT lobe. Bremssstrahlung scan demonstrates activity localized to RIGHT hepatic lobe with no extrahepatic activity identified. Electronically Signed   By: Suzy Bouchard M.D.   On: 12/07/2016 12:30   Ir US Guide Vasc Access Right  Result Date: 12/07/2016 CLINICAL DATA:  Metastatic MIXED ADENONEUROENDOCRINE CARCINOMA of the colon to the liver. See previous consultation report. EXAM: ULTRASOUND GUIDANCE FOR VASCULAR ACCESS SELECTIVE RIGHT HEPATIC ARTERY PERIPHERAL CATHETERIZATION, PRETREATMENT ANGIOGRAM, Y 90 RADIOEMBOLIZATION THERAPY AND POST EMBOLIZATION ANGIOGRAMS TECHNIQUE: Informed consent was obtained from the patient following explanation of the procedure, risks, benefits and alternatives. The patient understands, agrees and consents for the procedure. All questions were addressed. A time out was performed. As antibiotic prophylaxis, Zosyn 3.375 g was ordered pre-procedure and administered intravenously within one hour of incision. Maximal barrier sterile technique utilized including caps, mask, sterile gowns, sterile gloves, large sterile drape, hand hygiene, and betadine. Intravenous Fentanyl and Versed were administered as conscious sedation during continuous monitoring of the patient's level of consciousness and physiological / cardiorespiratory status by the radiology RN, with a total moderate sedation time of 43 minutes. Under sterile conditions and local anesthesia, a 21 gauge micropuncture set ultrasound guided access performed of the right common femoral artery. Over a a Benson guide wire, 5-French sheath was inserted. A 5- [place] Chung catheter was utilized to select the celiac origin. Arteriogram performed. The catheter was advanced into the common hepatic artery with the aid of an  angled roadrunner hydrophilic guidewire. Arteriogram performed, confirming durable coil occlusion of the GDA. A coaxial high-flow micro catheter was advanced over a 0.016 guidewire into the right hepatic artery. Selective hepatic pretreatment angiogram was performed. This confirmed good antegrade flow into distal hepatic branches. From this location, the Y 90 dose which had been calculated by the nuclear medicine physician was instilled into the right hepatic artery. Following embolization, a post embolization angiogram confirms preserved patency and antegrade flow in the right hepatic artery. The micro catheter and a 5-F catheter were removed safely. Hemostasis was obtained at the right common femoral artery access site with a 5 [place] Exoseal device after confirmatory femoral arteriogram. No immediate complication. Distal pedal pulses remained normal. The patient tolerated the treatment well. COMPLICATIONS: None immediate FLUOROSCOPY TIME:  3.9 minutes, 742 uGym2 DAP IMPRESSION: 1. Technically successful right hepatic artery Y 90 radioembolization for hepatic adenoneuroendocrine colon carcinoma metastases. A separate nuclear medicine report will be generated to document dosing and post treatment Bremsstrahlung scan. Electronically Signed   By: Lucrezia Europe M.D.   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 See previous consultation report. EXAM: ULTRASOUND GUIDANCE FOR VASCULAR ACCESS SELECTIVE RIGHT HEPATIC ARTERY PERIPHERAL CATHETERIZATION, PRETREATMENT ANGIOGRAM, Y 90 RADIOEMBOLIZATION THERAPY AND POST EMBOLIZATION ANGIOGRAMS TECHNIQUE: Informed consent was obtained from the patient following explanation of the procedure, risks, benefits and alternatives. The patient understands, agrees and consents for the procedure. All questions were addressed. A time out  was performed. As antibiotic prophylaxis, Zosyn 3.375 g was ordered pre-procedure and administered intravenously within one hour of incision. Maximal barrier sterile technique utilized including caps, mask, sterile gowns, sterile gloves, large sterile drape, hand hygiene, and betadine. Intravenous Fentanyl and Versed were administered as conscious sedation during continuous monitoring of the patient's level of consciousness and physiological / cardiorespiratory status by the radiology RN, with a total moderate sedation time of 43 minutes. Under sterile conditions and local anesthesia, a 21 gauge micropuncture set ultrasound guided access performed of the right common femoral artery. Over a a Benson guide wire, 5-French sheath was inserted. A 5- [place] Chung catheter was utilized to select the celiac origin. Arteriogram performed. The catheter was advanced into the common hepatic artery with the aid of an angled roadrunner hydrophilic guidewire. Arteriogram performed, confirming durable coil occlusion of the GDA. A coaxial high-flow micro catheter was advanced over a 0.016 guidewire into the right hepatic artery. Selective hepatic pretreatment angiogram was performed. This confirmed good antegrade flow into distal hepatic branches. From this location, the Y 90 dose which had been calculated by the nuclear medicine physician was instilled into the right hepatic artery. Following embolization, a post embolization angiogram confirms preserved patency and antegrade flow in the right hepatic artery. The micro catheter and a 5-F catheter were removed safely. Hemostasis was obtained at the right common femoral artery access site with a 5 [place] Exoseal device after confirmatory femoral arteriogram. No immediate complication. Distal pedal pulses remained normal. The patient tolerated the treatment well. COMPLICATIONS: None immediate FLUOROSCOPY TIME:  3.9 minutes, 742 uGym2 DAP IMPRESSION: 1. Technically successful right  hepatic artery Y 90 radioembolization for hepatic adenoneuroendocrine colon carcinoma metastases. A separate nuclear medicine report will be generated to document dosing and post treatment Bremsstrahlung scan. Electronically Signed   By: Lucrezia Europe M.D.   On: 12/07/2016 13:03   Nm Radio Pharm Therapy Intraarterial  Result Date: 12/07/2016 CLINICAL DATA:  Metastatic colorectal carcinoma. Unresectable liver metastasis. Yttrium 90 radio embolization RIGHT hepatic lobe. First therapy. EXAM: NUCLEAR MEDICINE SPECIAL MED RAD PHYSICS CONS; NUCLEAR MEDICINE RADIO PHARM THERAPY INTRA ARTERIAL; NUCLEAR MEDICINE TREATMENT PROCEDURE; NUCLEAR MEDICINE LIVER SCAN TECHNIQUE: In conjunction with the interventional radiologist a Y- Microsphere dose was calculated utilizing body surface area formulation. Calculated dose equal 31.4 mCi. Pre therapy MAA liver SPECT scan and CTA were evaluated. Utilizing a microcatheter system, the hepatic artery was selected and Y-90 microspheres were delivered in fractionated aliquots. Radiopharmaceutical was delivered by the interventional radiologist and nuclear radiologist. The patient tolerated procedure well. No adverse effects were noted. Bremsstrahlung planar and SPECT imaging of the abdomen following intrahepatic arterial delivery of Y-90 microsphere was performed. RADIOPHARMACEUTICALS:  16.9 millicuries yttrium 90 microspheres COMPARISON:  MAA scan 11/22/2016, CT scan 11/29/2016 FINDINGS: Y - 90 microspheres therapy as above. First therapy the right hepatic lobe. Bremsstrahlung planar and SPECT imaging of the abdomen following intrahepatic arterial delivery of Y-9microsphere demonstrates radioactivity localized to the RIGHT hepatic lobe. No evidence of extrahepatic activity. IMPRESSION: Successful Y - 90 microsphere delivery for treatment of unresectable liver metastasis. First therapy to the  RIGHT lobe. Bremssstrahlung scan demonstrates activity localized to RIGHT hepatic lobe with no  extrahepatic activity identified. Electronically Signed   By: Suzy Bouchard M.D.   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 We will start Avastin next cycle.  # Discussed with Dr. Vernard Gambles- recommend repeating a CT scan 2- 3 months post treatment; we'll plan to get enough August/September  # Dysuria- check QA culture  # check CEA today; follow up in 2 weeks/4 weeks/labs.     [name], MD 12/29/2016 2:12 PM

## 2016-12-29 NOTE — Assessment & Plan Note (Addendum)
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 We will start Avastin next cycle.  # Discussed with Dr. Vernard Gambles- recommend repeating a CT scan 2- 3 months post treatment; we'll plan to get enough August/September  # Dysuria- check QA culture  # check CEA today; follow up in 2 weeks/4 weeks/labs.

## 2016-12-29 NOTE — Progress Notes (Signed)
Patient here today for follow up.  Patient c/o dysuria  

## 2016-12-30 ENCOUNTER — Telehealth: Payer: Self-pay | Admitting: Internal Medicine

## 2016-12-30 ENCOUNTER — Other Ambulatory Visit: Payer: Self-pay | Admitting: *Deleted

## 2016-12-30 DIAGNOSIS — R970 Elevated carcinoembryonic antigen [CEA]: Secondary | ICD-10-CM

## 2016-12-30 DIAGNOSIS — C799 Secondary malignant neoplasm of unspecified site: Principal | ICD-10-CM

## 2016-12-30 DIAGNOSIS — R319 Hematuria, unspecified: Secondary | ICD-10-CM

## 2016-12-30 DIAGNOSIS — N390 Urinary tract infection, site not specified: Secondary | ICD-10-CM

## 2016-12-30 DIAGNOSIS — C189 Malignant neoplasm of colon, unspecified: Secondary | ICD-10-CM

## 2016-12-30 LAB — URINE CULTURE

## 2016-12-30 LAB — CEA: CEA: 638.7 ng/mL — ABNORMAL HIGH (ref 0.0–4.7)

## 2016-12-30 MED ORDER — CIPROFLOXACIN HCL 500 MG PO TABS: 500.0000 mg | ORAL_TABLET | Freq: Two times a day (BID) | ORAL | 0 refills | Status: DC

## 2016-12-30 NOTE — Telephone Encounter (Signed)
Labs added.

## 2016-12-30 NOTE — Telephone Encounter (Signed)
Spoke to the patient regarding the elevated CEA; plan to monitor closely. Plan to get CT of the abdomen and pelvis middle of August 2018 as per IR.   H/J- please add CEA to her labs at the next visit. Thx

## 2016-12-31 ENCOUNTER — Other Ambulatory Visit: Payer: Self-pay | Admitting: Internal Medicine

## 2016-12-31 ENCOUNTER — Inpatient Hospital Stay: Payer: Medicare HMO

## 2016-12-31 VITALS — BP 133/85 | HR 76 | Temp 96.0°F | Resp 18

## 2016-12-31 DIAGNOSIS — C799 Secondary malignant neoplasm of unspecified site: Principal | ICD-10-CM

## 2016-12-31 DIAGNOSIS — M129 Arthropathy, unspecified: Secondary | ICD-10-CM | POA: Diagnosis not present

## 2016-12-31 DIAGNOSIS — Z5111 Encounter for antineoplastic chemotherapy: Secondary | ICD-10-CM

## 2016-12-31 DIAGNOSIS — R112 Nausea with vomiting, unspecified: Secondary | ICD-10-CM

## 2016-12-31 DIAGNOSIS — Z95828 Presence of other vascular implants and grafts: Secondary | ICD-10-CM

## 2016-12-31 DIAGNOSIS — I10 Essential (primary) hypertension: Secondary | ICD-10-CM | POA: Diagnosis not present

## 2016-12-31 DIAGNOSIS — T451X5A Adverse effect of antineoplastic and immunosuppressive drugs, initial encounter: Secondary | ICD-10-CM

## 2016-12-31 DIAGNOSIS — R5383 Other fatigue: Secondary | ICD-10-CM | POA: Diagnosis not present

## 2016-12-31 DIAGNOSIS — R300 Dysuria: Secondary | ICD-10-CM | POA: Diagnosis not present

## 2016-12-31 DIAGNOSIS — C787 Secondary malignant neoplasm of liver and intrahepatic bile duct: Secondary | ICD-10-CM | POA: Diagnosis not present

## 2016-12-31 DIAGNOSIS — D1771 Benign lipomatous neoplasm of kidney: Secondary | ICD-10-CM | POA: Diagnosis not present

## 2016-12-31 DIAGNOSIS — R69 Illness, unspecified: Secondary | ICD-10-CM | POA: Diagnosis not present

## 2016-12-31 DIAGNOSIS — R110 Nausea: Secondary | ICD-10-CM | POA: Diagnosis not present

## 2016-12-31 DIAGNOSIS — C182 Malignant neoplasm of ascending colon: Secondary | ICD-10-CM | POA: Diagnosis not present

## 2016-12-31 DIAGNOSIS — C189 Malignant neoplasm of colon, unspecified: Secondary | ICD-10-CM

## 2016-12-31 DIAGNOSIS — C801 Malignant (primary) neoplasm, unspecified: Secondary | ICD-10-CM

## 2016-12-31 MED ORDER — HEPARIN SOD (PORK) LOCK FLUSH 100 UNIT/ML IV SOLN
500.0000 [IU] | Freq: Once | INTRAVENOUS | Status: AC
  Administered 2016-12-31: 500 [IU] via INTRAVENOUS
  Filled 2016-12-31: qty 5

## 2016-12-31 MED ORDER — SODIUM CHLORIDE 0.9% FLUSH
10.0000 mL | INTRAVENOUS | Status: DC | PRN
  Administered 2016-12-31: 10 mL via INTRAVENOUS
  Filled 2016-12-31: qty 10

## 2017-01-12 ENCOUNTER — Inpatient Hospital Stay: Payer: Medicare HMO

## 2017-01-12 ENCOUNTER — Inpatient Hospital Stay (HOSPITAL_BASED_OUTPATIENT_CLINIC_OR_DEPARTMENT_OTHER): Payer: Medicare HMO | Admitting: Internal Medicine

## 2017-01-12 VITALS — BP 144/86 | HR 81 | Temp 97.6°F | Resp 20 | Ht 65.0 in | Wt 203.0 lb

## 2017-01-12 DIAGNOSIS — R110 Nausea: Secondary | ICD-10-CM

## 2017-01-12 DIAGNOSIS — I10 Essential (primary) hypertension: Secondary | ICD-10-CM

## 2017-01-12 DIAGNOSIS — R970 Elevated carcinoembryonic antigen [CEA]: Secondary | ICD-10-CM

## 2017-01-12 DIAGNOSIS — F419 Anxiety disorder, unspecified: Secondary | ICD-10-CM | POA: Diagnosis not present

## 2017-01-12 DIAGNOSIS — Z5111 Encounter for antineoplastic chemotherapy: Secondary | ICD-10-CM

## 2017-01-12 DIAGNOSIS — C189 Malignant neoplasm of colon, unspecified: Secondary | ICD-10-CM

## 2017-01-12 DIAGNOSIS — Z79899 Other long term (current) drug therapy: Secondary | ICD-10-CM

## 2017-01-12 DIAGNOSIS — C182 Malignant neoplasm of ascending colon: Secondary | ICD-10-CM | POA: Diagnosis not present

## 2017-01-12 DIAGNOSIS — D1771 Benign lipomatous neoplasm of kidney: Secondary | ICD-10-CM | POA: Diagnosis not present

## 2017-01-12 DIAGNOSIS — R5383 Other fatigue: Secondary | ICD-10-CM | POA: Diagnosis not present

## 2017-01-12 DIAGNOSIS — C787 Secondary malignant neoplasm of liver and intrahepatic bile duct: Secondary | ICD-10-CM

## 2017-01-12 DIAGNOSIS — M129 Arthropathy, unspecified: Secondary | ICD-10-CM | POA: Diagnosis not present

## 2017-01-12 DIAGNOSIS — E669 Obesity, unspecified: Secondary | ICD-10-CM | POA: Diagnosis not present

## 2017-01-12 DIAGNOSIS — C799 Secondary malignant neoplasm of unspecified site: Secondary | ICD-10-CM

## 2017-01-12 DIAGNOSIS — R300 Dysuria: Secondary | ICD-10-CM | POA: Diagnosis not present

## 2017-01-12 DIAGNOSIS — R69 Illness, unspecified: Secondary | ICD-10-CM | POA: Diagnosis not present

## 2017-01-12 LAB — COMPREHENSIVE METABOLIC PANEL
ALK PHOS: 101 U/L (ref 38–126)
ALT: 32 U/L (ref 14–54)
AST: 41 U/L (ref 15–41)
Albumin: 3.8 g/dL (ref 3.5–5.0)
Anion gap: 4 — ABNORMAL LOW (ref 5–15)
BUN: 11 mg/dL (ref 6–20)
CALCIUM: 9.1 mg/dL (ref 8.9–10.3)
CO2: 23 mmol/L (ref 22–32)
CREATININE: 0.95 mg/dL (ref 0.44–1.00)
Chloride: 107 mmol/L (ref 101–111)
GFR calc non Af Amer: >60 mL/min (ref >60)
Glucose, Bld: 97 mg/dL (ref 65–99)
Potassium: 4 mmol/L (ref 3.5–5.1)
SODIUM: 134 mmol/L — AB (ref 135–145)
Total Bilirubin: 0.5 mg/dL (ref 0.3–1.2)
Total Protein: 7.9 g/dL (ref 6.5–8.1)

## 2017-01-12 LAB — CBC WITH DIFFERENTIAL/PLATELET
BASOS ABS: 0 10*3/uL (ref 0–0.1)
Basophils Relative: 1 %
EOS ABS: 0.1 10*3/uL (ref 0–0.7)
Eosinophils Relative: 3 %
HCT: 33.9 % — ABNORMAL LOW (ref 35.0–47.0)
HEMOGLOBIN: 11.8 g/dL — AB (ref 12.0–16.0)
LYMPHS ABS: 0.4 10*3/uL — AB (ref 1.0–3.6)
LYMPHS PCT: 16 %
MCH: 35.5 pg — AB (ref 26.0–34.0)
MCHC: 34.8 g/dL (ref 32.0–36.0)
MCV: 102 fL — AB (ref 80.0–100.0)
Monocytes Absolute: 0.3 10*3/uL (ref 0.2–0.9)
Monocytes Relative: 13 %
NEUTROS PCT: 67 %
Neutro Abs: 1.8 10*3/uL (ref 1.4–6.5)
Platelets: 150 10*3/uL (ref 150–440)
RBC: 3.32 MIL/uL — AB (ref 3.80–5.20)
RDW: 14.2 % (ref 11.5–14.5)
WBC: 2.7 10*3/uL — AB (ref 3.6–11.0)

## 2017-01-12 LAB — URINALYSIS, COMPLETE (UACMP) WITH MICROSCOPIC
Bacteria, UA: NONE SEEN
Bilirubin Urine: NEGATIVE
GLUCOSE, UA: NEGATIVE mg/dL
Ketones, ur: NEGATIVE mg/dL
Leukocytes, UA: NEGATIVE
NITRITE: NEGATIVE
PH: 5 (ref 5.0–8.0)
PROTEIN: NEGATIVE mg/dL
Specific Gravity, Urine: 1.012 (ref 1.005–1.030)

## 2017-01-12 MED ORDER — DEXTROSE 5 % IV SOLN
Freq: Once | INTRAVENOUS | Status: AC
  Administered 2017-01-12: 14:00:00 via INTRAVENOUS
  Filled 2017-01-12: qty 1000

## 2017-01-12 MED ORDER — SODIUM CHLORIDE 0.9 % IV SOLN
2400.0000 mg/m2 | INTRAVENOUS | Status: DC
  Administered 2017-01-12: 4850 mg via INTRAVENOUS
  Filled 2017-01-12: qty 97

## 2017-01-12 MED ORDER — SODIUM CHLORIDE 0.9% FLUSH
10.0000 mL | INTRAVENOUS | Status: DC | PRN
  Administered 2017-01-12: 10 mL via INTRAVENOUS
  Filled 2017-01-12: qty 10

## 2017-01-12 MED ORDER — SODIUM CHLORIDE 0.9 % IV SOLN
5.0000 mg/kg | Freq: Once | INTRAVENOUS | Status: AC
  Administered 2017-01-12: 450 mg via INTRAVENOUS
  Filled 2017-01-12: qty 16

## 2017-01-12 MED ORDER — SODIUM CHLORIDE 0.9 % IV SOLN
Freq: Once | INTRAVENOUS | Status: AC
  Administered 2017-01-12: 12:00:00 via INTRAVENOUS
  Filled 2017-01-12: qty 100

## 2017-01-12 MED ORDER — FOSAPREPITANT DIMEGLUMINE INJECTION 150 MG
Freq: Once | INTRAVENOUS | Status: AC
  Administered 2017-01-12: 13:00:00 via INTRAVENOUS
  Filled 2017-01-12: qty 5

## 2017-01-12 MED ORDER — PALONOSETRON HCL INJECTION 0.25 MG/5ML
0.2500 mg | Freq: Once | INTRAVENOUS | Status: AC
  Administered 2017-01-12: 0.25 mg via INTRAVENOUS
  Filled 2017-01-12: qty 5

## 2017-01-12 MED ORDER — OXALIPLATIN CHEMO INJECTION 100 MG/20ML
85.0000 mg/m2 | Freq: Once | INTRAVENOUS | Status: AC
  Administered 2017-01-12: 170 mg via INTRAVENOUS
  Filled 2017-01-12: qty 34

## 2017-01-12 MED ORDER — DEXTROSE 5 % IV SOLN
800.0000 mg | Freq: Once | INTRAVENOUS | Status: AC
  Administered 2017-01-12: 800 mg via INTRAVENOUS
  Filled 2017-01-12: qty 40

## 2017-01-12 NOTE — Assessment & Plan Note (Addendum)
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 Patient tolerated chemotherapy fairly well.   # I discussed my concerns with the patient regarding the rising CEA ~600; however she has not had enough systemic therapy at least since May 2018 [given the primary colon surgery; Y 90]  # Proceed with FOLFOX plus Avastin chemo today; Labs today reviewed;  acceptable for treatment today.   # Will plan to get a CT scan with contrast- in end of aug to mid sep post y90 to evaluate the response to treatment.  # follow up in 2 weeks/4 weeks/labs.

## 2017-01-12 NOTE — Progress Notes (Signed)
[facility] NOTE  [name], [name], MD (Internal Medicine) Bary Castilla, Forest Gleason, [name], RN as Registered Nurse [name], MD as Consulting Physician (Internal Medicine)  CHIEF COMPLAINTS/PURPOSE OF CONSULTATION:   Oncology History   # MARCH/APRIL 2017-NEUROENDOCRINE CA STAGE IV; [name]; [name]; [name]; [name]; [name]; [name]; [name]; July 14th  2017 CT- PR  # AUG 28th- FOLFIRI +Avastin; OCT 5th CT scan- PR.   # MARCH 20th- CT STABLE liver lesions; ? Enlarging cecal lesion [CEA rising]; March 26th- FOLFOX [last avastin march 12th 2018];   # resection of primary tumor [Dr.Byrnett]; y90- June 19th 2018.  # July 11th-FOLFOX   # [facility]- B-RAF V600E- MUTATED [May 1245]  # right kidney lesion 2.6 x 1.6 cm ? Angiolipoma [followed by Urology in past]     Malignant neoplasm of ascending colon ([place])     HISTORY OF PRESENTING ILLNESS:  Kathleen James 66 y.o.  female with neuroendocrine carcinoma of colon primary with metastases to the liver; Status post resection of her primary colon tumor; and also Y90 to liver lesions [June 19th]- has been restarted back on systemic therapy with FOLFOX.  Patient admits to mild fatigue postchemotherapy. Denies any significant diarrhea. She denies any constipation. Appetite is good. No pain. No swelling of the legs. Denies any tingling or numbness. No shortness of breath or chest pain. No cough.  ROS: A complete 10 point review of system is done which is negative except mentioned above in history of present illness  MEDICAL HISTORY:  Past Medical History:   Diagnosis Date  . Angiolipoma of kidney    followed with serial CTs ,,  Dr. Jacqlyn Larsen  . Anxiety   . Arthritis   . Cancer ([place])    colon, MIXED ADENONEUROENDOCRINE CARCINOMA INVOLVING CECUM AND ILEOCECAL   . Chemotherapy induced diarrhea   . Hammer toe   . Hypertension   . Liver cancer ([place])   . Neuro-endocrine carcinoma ([place])   . Obesity (BMI 30-39.9)     SURGICAL HISTORY: Past Surgical History:  Procedure Laterality Date  . ABDOMINAL HYSTERECTOMY  1995   endometriosis, heavy bleeding  . BUNIONECTOMY Bilateral   . CESAREAN SECTION  1985  . COLONOSCOPY WITH PROPOFOL N/A 10/09/2015   Procedure: COLONOSCOPY WITH PROPOFOL;  Surgeon: Robert Bellow, MD;  Location[facility] ENDOSCOPY;  Service: Endoscopy;  Laterality: N/A;  . IR ANGIOGRAM SELECTIVE EACH ADDITIONAL VESSEL  11/22/2016  . IR ANGIOGRAM SELECTIVE EACH ADDITIONAL VESSEL  11/22/2016  . IR ANGIOGRAM SELECTIVE EACH ADDITIONAL VESSEL  11/22/2016  . IR ANGIOGRAM SELECTIVE EACH ADDITIONAL VESSEL  12/07/2016  . IR ANGIOGRAM SELECTIVE EACH ADDITIONAL VESSEL  12/07/2016  . IR ANGIOGRAM VISCERAL SELECTIVE  11/22/2016  . IR ANGIOGRAM VISCERAL SELECTIVE  12/07/2016  . IR EMBO ARTERIAL NOT HEMORR HEMANG INC GUIDE ROADMAPPING  11/22/2016  . IR EMBO TUMOR ORGAN ISCHEMIA INFARCT INC GUIDE ROADMAPPING  12/07/2016  . IR GENERIC HISTORICAL  04/07/2016   IR RADIOLOGIST EVAL & MGMT 04/07/2016 Arne Cleveland, MD [facility] INTERV RAD  . IR US GUIDE VASC ACCESS RIGHT  11/22/2016  . IR US GUIDE VASC ACCESS RIGHT  12/07/2016  . LAPAROSCOPIC  RIGHT COLECTOMY Right 10/26/2016   Procedure: LAPAROSCOPIC RIGHT COLECTOMY;  Surgeon: Robert Bellow, MD;  Location: [facility] ORS;  Service: General;  Laterality: Right;  . OOPHORECTOMY    . PORTACATH PLACEMENT Left 10/01/2015   Procedure: INSERTION PORT-A-CATH;  Surgeon: Robert Bellow, MD;  Location: [facility] ORS;  Service: General;  Laterality: Left;  . ROTATOR CUFF REPAIR Right 2008   right shoulder.  [place]     SOCIAL HISTORY:  She lives at home with her family in [place]. 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 Social History   Social History  . Marital status: Married    Spouse name: N/A  . Number of children: N/A  . Years of education: N/A   Occupational History  . Not on file.   Social History Main Topics  . Smoking status: Never Smoker  . Smokeless tobacco: Never Used  . Alcohol use 0.0 oz/week     Comment: occasionally  . Drug use: No  . Sexual activity: Not Currently   Other Topics Concern  . Not on file   Social History Narrative  . No narrative on file    FAMILY HISTORY: Family History  Problem Relation Age of Onset  . Mental illness Mother 6       bipolar, dementia  . Cancer Maternal Aunt 70       breast ca  . Hypertension Father   . Stroke Father 98       deceased  . COPD Father   . Heart disease Sister        deceased    ALLERGIES:  is allergic to bee venom; dilaudid [hydromorphone hcl]; and morphine and related.  MEDICATIONS:  Current Outpatient Prescriptions  Medication Sig Dispense Refill  . ALPRAZolam (XANAX) 0.25 MG tablet TAKE 1 TABLET TWICE DAILY AS NEEDED FOR ANXIETY OR SLEEP 60 tablet 1  . amLODipine (NORVASC) 5 MG tablet TAKE ONE TABLET BY MOUTH EVERY DAY 30 tablet 2  . Biotin w/ Vitamins C & E (HAIR/SKIN/NAILS PO) Take 1 tablet by mouth daily.    . Chlorpheniramine Maleate (CHLOR-TABLETS PO) Take 1 tablet by mouth every evening.    . lidocaine-prilocaine (EMLA) cream Apply 1 application topically as needed. Apply to port a cath site 1 hour prior to chemotherapy treatments 30 g 6  . olmesartan (BENICAR) 40 MG tablet TAKE ONE TABLET BY MOUTH EVERY DAY 30 tablet 2  . ondansetron (ZOFRAN) 8 MG tablet TAKE 1 TABLET EVERY 8 HOURS AS NEEDED FOR NAUSEA AND VOMITING 40 tablet 2  . potassium chloride (K-DUR) 10 MEQ tablet TAKE ONE TABLET BY MOUTH TWICE DAILY 60 tablet 4  . prochlorperazine (COMPAZINE) 10 MG tablet TAKE ONE  TABLET BY MOUTH EVERY 6 HOURS AS NEEDED FOR NAUSEA OR VOMITING 30 tablet 2  . diphenoxylate-atropine (LOMOTIL) 2.5-0.025 MG tablet TAKE 1 TABLET FOUR TIMES DAILY AS NEEDEDFOR DIARRHEA OR LOOSE STOOLS (Patient not taking: Reported on 01/12/2017) 30 tablet 3  . loperamide (IMODIUM A-D) 2 MG tablet Take 2 mg by mouth 4 (four) times daily as needed for diarrhea or loose stools.    . naproxen sodium (ANAPROX) 220 MG tablet Take 440 mg by mouth 2 (two) times daily as needed.     No current facility-administered medications for this visit.    Facility-Administered Medications Ordered in Other Visits  Medication Dose Route Frequency Provider Last Rate Last Dose  . fluorouracil (ADRUCIL) 4,850 mg in sodium chloride 0.9 %  53 mL chemo infusion  2,400 mg/m2 (Treatment Plan Recorded) Intravenous 1 day or 1 dose Charlaine Dalton R, MD   4,850 mg at 01/12/17 1641  . heparin lock flush 100 unit/mL  500 Units Intravenous Once Charlaine Dalton R, MD      . sodium chloride flush (NS) 0.9 % injection 10 mL  10 mL Intravenous PRN [name], MD   10 mL at 11/24/15 0836  . sodium chloride flush (NS) 0.9 % injection 10 mL  10 mL Intravenous PRN [name], MD   10 mL at 01/12/17 0929      .  PHYSICAL EXAMINATION: ECOG PERFORMANCE STATUS: 0 - Asymptomatic  Vitals:   01/12/17 0949  BP: (!) 144/86  Pulse: 81  Resp: 20  Temp: 97.6 F (36.4 C)   Filed Weights   01/12/17 0949  Weight: 203 lb (92.1 kg)    GENERAL: Well-nourished well-developed; Alert, no distress and comfortable.Alone.  EYES: no pallor or icterus OROPHARYNX: no thrush or ulceration; good dentition  NECK: supple, no masses felt LYMPH:  no palpable lymphadenopathy in the cervical, axillary or inguinal regions LUNGS: clear to auscultation and  No wheeze or crackles HEART/CVS: regular rate & rhythm and no murmurs; No lower extremity edema ABDOMEN: abdomen soft, non-tender and normal bowel sounds; positive for  hepatomegaly. Musculoskeletal:no cyanosis of digits and no clubbing  PSYCH: alert & oriented x 3 with fluent speech NEURO: no focal motor/sensory deficits SKIN:  no rashes or significant lesions; Mediport is in place.   LABORATORY DATA:  I have reviewed the data as listed Lab Results  Component Value Date   WBC 2.7 (L) 01/12/2017   HGB 11.8 (L) 01/12/2017   HCT 33.9 (L) 01/12/2017   MCV 102.0 (H) 01/12/2017   PLT 150 01/12/2017    Recent Labs  12/07/16 0855 12/29/16 0945 01/12/17 0929  NA 139 136 134*  K 4.2 3.7 4.0  CL 108 105 107  CO2 23 23 23   GLUCOSE 89 99 97  BUN 17 17 11   CREATININE 0.92 0.96 0.95  CALCIUM 9.3 8.9 9.1  GFRNONAA >60 >60 >60  GFRAA >60 >60 >60  PROT 8.2* 7.8 7.9  ALBUMIN 4.1 3.7 3.8  AST 25 32 41  ALT 19 24 32  ALKPHOS 80 95 101  BILITOT 0.4 0.5 0.5    RADIOGRAPHIC STUDIES: I have personally reviewed the radiological images as listed and agreed with the findings in the report. No results found.  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 Patient tolerated chemotherapy fairly well.   # I discussed my concerns with the patient regarding the rising CEA ~600; however she has not had enough systemic therapy at least since May 2018 [given the primary colon surgery; Y 90]  # Proceed with FOLFOX plus Avastin chemo today; Labs today reviewed;  acceptable for treatment today.   # Will plan to get a CT scan with contrast- in end of aug to mid sep post y90 to evaluate the response to treatment.  # follow up in 2 weeks/4 weeks/labs.    [name], MD 01/12/2017 5:04 PM

## 2017-01-13 ENCOUNTER — Encounter: Payer: Self-pay | Admitting: *Deleted

## 2017-01-13 LAB — CEA: CEA1: 521.8 ng/mL — AB (ref 0.0–4.7)

## 2017-01-14 ENCOUNTER — Inpatient Hospital Stay: Payer: Medicare HMO

## 2017-01-14 DIAGNOSIS — C189 Malignant neoplasm of colon, unspecified: Secondary | ICD-10-CM

## 2017-01-14 DIAGNOSIS — M129 Arthropathy, unspecified: Secondary | ICD-10-CM | POA: Diagnosis not present

## 2017-01-14 DIAGNOSIS — C182 Malignant neoplasm of ascending colon: Secondary | ICD-10-CM

## 2017-01-14 DIAGNOSIS — R5383 Other fatigue: Secondary | ICD-10-CM | POA: Diagnosis not present

## 2017-01-14 DIAGNOSIS — R300 Dysuria: Secondary | ICD-10-CM | POA: Diagnosis not present

## 2017-01-14 DIAGNOSIS — C799 Secondary malignant neoplasm of unspecified site: Secondary | ICD-10-CM

## 2017-01-14 DIAGNOSIS — Z5111 Encounter for antineoplastic chemotherapy: Secondary | ICD-10-CM | POA: Diagnosis not present

## 2017-01-14 DIAGNOSIS — R110 Nausea: Secondary | ICD-10-CM | POA: Diagnosis not present

## 2017-01-14 DIAGNOSIS — D1771 Benign lipomatous neoplasm of kidney: Secondary | ICD-10-CM | POA: Diagnosis not present

## 2017-01-14 DIAGNOSIS — R69 Illness, unspecified: Secondary | ICD-10-CM | POA: Diagnosis not present

## 2017-01-14 DIAGNOSIS — C787 Secondary malignant neoplasm of liver and intrahepatic bile duct: Secondary | ICD-10-CM | POA: Diagnosis not present

## 2017-01-14 DIAGNOSIS — I10 Essential (primary) hypertension: Secondary | ICD-10-CM | POA: Diagnosis not present

## 2017-01-14 MED ORDER — HEPARIN SOD (PORK) LOCK FLUSH 100 UNIT/ML IV SOLN
500.0000 [IU] | Freq: Once | INTRAVENOUS | Status: AC | PRN
Start: 2017-01-14 — End: 2017-01-14
  Administered 2017-01-14: 500 [IU]

## 2017-01-14 MED ORDER — SODIUM CHLORIDE 0.9% FLUSH
10.0000 mL | INTRAVENOUS | Status: DC | PRN
  Administered 2017-01-14: 10 mL
  Filled 2017-01-14: qty 10

## 2017-01-26 ENCOUNTER — Inpatient Hospital Stay (HOSPITAL_BASED_OUTPATIENT_CLINIC_OR_DEPARTMENT_OTHER): Payer: Medicare HMO | Admitting: Internal Medicine

## 2017-01-26 ENCOUNTER — Inpatient Hospital Stay: Payer: Medicare HMO | Attending: Internal Medicine

## 2017-01-26 ENCOUNTER — Inpatient Hospital Stay: Payer: Medicare HMO

## 2017-01-26 VITALS — BP 139/95 | HR 84 | Temp 97.6°F | Resp 18 | Ht 65.0 in | Wt 202.2 lb

## 2017-01-26 DIAGNOSIS — Z5111 Encounter for antineoplastic chemotherapy: Secondary | ICD-10-CM | POA: Diagnosis not present

## 2017-01-26 DIAGNOSIS — R5383 Other fatigue: Secondary | ICD-10-CM | POA: Insufficient documentation

## 2017-01-26 DIAGNOSIS — Z79899 Other long term (current) drug therapy: Secondary | ICD-10-CM

## 2017-01-26 DIAGNOSIS — E669 Obesity, unspecified: Secondary | ICD-10-CM | POA: Insufficient documentation

## 2017-01-26 DIAGNOSIS — C787 Secondary malignant neoplasm of liver and intrahepatic bile duct: Secondary | ICD-10-CM

## 2017-01-26 DIAGNOSIS — F419 Anxiety disorder, unspecified: Secondary | ICD-10-CM | POA: Diagnosis not present

## 2017-01-26 DIAGNOSIS — Z7689 Persons encountering health services in other specified circumstances: Secondary | ICD-10-CM | POA: Insufficient documentation

## 2017-01-26 DIAGNOSIS — C182 Malignant neoplasm of ascending colon: Secondary | ICD-10-CM | POA: Insufficient documentation

## 2017-01-26 DIAGNOSIS — R112 Nausea with vomiting, unspecified: Secondary | ICD-10-CM

## 2017-01-26 DIAGNOSIS — C799 Secondary malignant neoplasm of unspecified site: Secondary | ICD-10-CM

## 2017-01-26 DIAGNOSIS — C189 Malignant neoplasm of colon, unspecified: Secondary | ICD-10-CM

## 2017-01-26 DIAGNOSIS — R970 Elevated carcinoembryonic antigen [CEA]: Secondary | ICD-10-CM

## 2017-01-26 DIAGNOSIS — Z5112 Encounter for antineoplastic immunotherapy: Secondary | ICD-10-CM | POA: Insufficient documentation

## 2017-01-26 DIAGNOSIS — C779 Secondary and unspecified malignant neoplasm of lymph node, unspecified: Secondary | ICD-10-CM | POA: Diagnosis not present

## 2017-01-26 DIAGNOSIS — I10 Essential (primary) hypertension: Secondary | ICD-10-CM | POA: Insufficient documentation

## 2017-01-26 DIAGNOSIS — R69 Illness, unspecified: Secondary | ICD-10-CM | POA: Diagnosis not present

## 2017-01-26 DIAGNOSIS — D701 Agranulocytosis secondary to cancer chemotherapy: Secondary | ICD-10-CM | POA: Insufficient documentation

## 2017-01-26 DIAGNOSIS — T451X5A Adverse effect of antineoplastic and immunosuppressive drugs, initial encounter: Secondary | ICD-10-CM

## 2017-01-26 LAB — CBC WITH DIFFERENTIAL/PLATELET
BASOS ABS: 0 10*3/uL (ref 0–0.1)
BASOS PCT: 2 %
Eosinophils Absolute: 0.1 10*3/uL (ref 0–0.7)
Eosinophils Relative: 4 %
HEMATOCRIT: 34 % — AB (ref 35.0–47.0)
Hemoglobin: 11.9 g/dL — ABNORMAL LOW (ref 12.0–16.0)
Lymphocytes Relative: 26 %
Lymphs Abs: 0.5 10*3/uL — ABNORMAL LOW (ref 1.0–3.6)
MCH: 35.3 pg — ABNORMAL HIGH (ref 26.0–34.0)
MCHC: 35 g/dL (ref 32.0–36.0)
MCV: 101 fL — ABNORMAL HIGH (ref 80.0–100.0)
MONO ABS: 0.4 10*3/uL (ref 0.2–0.9)
Monocytes Relative: 17 %
NEUTROS ABS: 1.1 10*3/uL — AB (ref 1.4–6.5)
NEUTROS PCT: 51 %
Platelets: 148 10*3/uL — ABNORMAL LOW (ref 150–440)
RBC: 3.36 MIL/uL — ABNORMAL LOW (ref 3.80–5.20)
RDW: 14.4 % (ref 11.5–14.5)
WBC: 2.1 10*3/uL — ABNORMAL LOW (ref 3.6–11.0)

## 2017-01-26 LAB — COMPREHENSIVE METABOLIC PANEL
ALBUMIN: 3.5 g/dL (ref 3.5–5.0)
ALT: 43 U/L (ref 14–54)
AST: 48 U/L — AB (ref 15–41)
Alkaline Phosphatase: 122 U/L (ref 38–126)
Anion gap: 6 (ref 5–15)
BILIRUBIN TOTAL: 0.3 mg/dL (ref 0.3–1.2)
BUN: 14 mg/dL (ref 6–20)
CHLORIDE: 104 mmol/L (ref 101–111)
CO2: 23 mmol/L (ref 22–32)
Calcium: 8.7 mg/dL — ABNORMAL LOW (ref 8.9–10.3)
Creatinine, Ser: 0.86 mg/dL (ref 0.44–1.00)
GFR calc Af Amer: >60 mL/min (ref >60)
GFR calc non Af Amer: >60 mL/min (ref >60)
GLUCOSE: 83 mg/dL (ref 65–99)
POTASSIUM: 3.9 mmol/L (ref 3.5–5.1)
Sodium: 133 mmol/L — ABNORMAL LOW (ref 135–145)
Total Protein: 7.4 g/dL (ref 6.5–8.1)

## 2017-01-26 LAB — URINALYSIS, COMPLETE (UACMP) WITH MICROSCOPIC
BACTERIA UA: NONE SEEN
Bilirubin Urine: NEGATIVE
Glucose, UA: NEGATIVE mg/dL
KETONES UR: NEGATIVE mg/dL
Leukocytes, UA: NEGATIVE
Nitrite: NEGATIVE
PROTEIN: NEGATIVE mg/dL
Specific Gravity, Urine: 1.008 (ref 1.005–1.030)
pH: 5 (ref 5.0–8.0)

## 2017-01-26 MED ORDER — SODIUM CHLORIDE 0.9 % IV SOLN
5.0000 mg/kg | Freq: Once | INTRAVENOUS | Status: AC
  Administered 2017-01-26: 450 mg via INTRAVENOUS
  Filled 2017-01-26: qty 16

## 2017-01-26 MED ORDER — SODIUM CHLORIDE 0.9% FLUSH
10.0000 mL | INTRAVENOUS | Status: DC | PRN
  Filled 2017-01-26: qty 10

## 2017-01-26 MED ORDER — HEPARIN SOD (PORK) LOCK FLUSH 100 UNIT/ML IV SOLN
500.0000 [IU] | Freq: Once | INTRAVENOUS | Status: DC | PRN
  Filled 2017-01-26: qty 5

## 2017-01-26 MED ORDER — SODIUM CHLORIDE 0.9 % IV SOLN
Freq: Once | INTRAVENOUS | Status: AC
  Administered 2017-01-26: 12:00:00 via INTRAVENOUS
  Filled 2017-01-26: qty 100

## 2017-01-26 MED ORDER — DEXTROSE 5 % IV SOLN
Freq: Once | INTRAVENOUS | Status: AC
  Administered 2017-01-26: 14:00:00 via INTRAVENOUS
  Filled 2017-01-26: qty 1000

## 2017-01-26 MED ORDER — ONDANSETRON HCL 8 MG PO TABS: 8.0000 mg | ORAL_TABLET | Freq: Three times a day (TID) | ORAL | 3 refills | Status: DC | PRN

## 2017-01-26 MED ORDER — SODIUM CHLORIDE 0.9 % IV SOLN
Freq: Once | INTRAVENOUS | Status: AC
  Administered 2017-01-26: 13:00:00 via INTRAVENOUS
  Filled 2017-01-26: qty 5

## 2017-01-26 MED ORDER — LEUCOVORIN CALCIUM INJECTION 350 MG
800.0000 mg | Freq: Once | INTRAMUSCULAR | Status: AC
  Administered 2017-01-26: 800 mg via INTRAVENOUS
  Filled 2017-01-26: qty 40

## 2017-01-26 MED ORDER — OXALIPLATIN CHEMO INJECTION 100 MG/20ML
85.0000 mg/m2 | Freq: Once | INTRAVENOUS | Status: AC
  Administered 2017-01-26: 170 mg via INTRAVENOUS
  Filled 2017-01-26: qty 34

## 2017-01-26 MED ORDER — PALONOSETRON HCL INJECTION 0.25 MG/5ML
0.2500 mg | Freq: Once | INTRAVENOUS | Status: AC
  Administered 2017-01-26: 0.25 mg via INTRAVENOUS
  Filled 2017-01-26: qty 5

## 2017-01-26 MED ORDER — SODIUM CHLORIDE 0.9 % IV SOLN
Freq: Once | INTRAVENOUS | Status: AC
  Administered 2017-01-26: 12:00:00 via INTRAVENOUS
  Filled 2017-01-26: qty 1000

## 2017-01-26 MED ORDER — SODIUM CHLORIDE 0.9 % IV SOLN
2400.0000 mg/m2 | INTRAVENOUS | Status: DC
  Administered 2017-01-26: 4850 mg via INTRAVENOUS
  Filled 2017-01-26: qty 97

## 2017-01-26 NOTE — Progress Notes (Signed)
[place] NOTE  [name], [name], MD (Internal Medicine) Bary Castilla, Forest Gleason, [name], RN as Registered Nurse [name], MD as Consulting Physician (Internal Medicine)  CHIEF COMPLAINTS/PURPOSE OF CONSULTATION:   Oncology History   # MARCH/APRIL 2017-NEUROENDOCRINE CA STAGE IV; [name]; [name]; [name]; [name]; [name]; [name]; [name]; July 14th  2017 CT- PR  # AUG 28th- FOLFIRI +Avastin; OCT 5th CT scan- PR.   # MARCH 20th- CT STABLE liver lesions; ? Enlarging cecal lesion [CEA rising]; March 26th- FOLFOX [last avastin march 12th 2018];   # resection of primary tumor [Dr.Byrnett]; y90- June 19th 2018.  # July 11th-FOLFOX   # FOUNDATION ONE- B-RAF V600E- MUTATED [May 6226]  # right kidney lesion 2.6 x 1.6 cm ? Angiolipoma [followed by Urology in past]     Malignant neoplasm of ascending colon ([place])     HISTORY OF PRESENTING ILLNESS:  Kathleen James 66 y.o.  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  Patient complains of increasing fatigue postchemotherapy.  Appetite is good. No pain. No swelling of the legs. Denies any tingling or numbness. No shortness of breath or chest pain. No cough.  ROS: A complete 10 point review of system is done which is negative except mentioned above in history of present illness  MEDICAL HISTORY:  Past Medical History:  Diagnosis Date  . Angiolipoma of kidney     followed with serial CTs ,,  Dr. Jacqlyn Larsen  . Anxiety   . Arthritis   . 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 Chemotherapy induced diarrhea   . Hammer toe   . Hypertension   . Liver cancer ([place])   . Neuro-endocrine carcinoma ([place])   . Obesity (BMI 30-39.9)     SURGICAL HISTORY: Past Surgical History:  Procedure Laterality Date  . ABDOMINAL HYSTERECTOMY  1995   endometriosis, heavy bleeding  . BUNIONECTOMY Bilateral   . CESAREAN SECTION  1985  . COLONOSCOPY WITH PROPOFOL N/A 10/09/2015   Procedure: COLONOSCOPY WITH PROPOFOL;  Surgeon: Robert Bellow, MD;  [facility] END[facility];  Service: Endoscopy;  Laterality: N/A;  . IR ANGIOGRAM SELECTIVE EACH ADDITIONAL VESSEL  11/22/2016  . IR ANGIOGRAM SELECTIVE EACH ADDITIONAL VESSEL  11/22/2016  . IR ANGIOGRAM SELECTIVE EACH ADDITIONAL VESSEL  11/22/2016  . IR ANGIOGRAM SELECTIVE EACH ADDITIONAL VESSEL  12/07/2016  . IR ANGIOGRAM SELECTIVE EACH ADDITIONAL VESSEL  12/07/2016  . IR ANGIOGRAM VISCERAL SELECTIVE  11/22/2016  . IR ANGIOGRAM VISCERAL SELECTIVE  12/07/2016  . IR EMBO ARTERIAL NOT HEMORR HEMANG INC GUIDE ROADMAPPING  11/22/2016  . IR EMBO TUMOR ORGAN ISCHEMIA INFARCT INC GUIDE ROADMAPPING  12/07/2016  . IR GENERIC HISTORICAL  04/07/2016   IR RADIOLOGIST EVAL & MGMT 04/07/2016 Arne Cleveland, MD [facility] INTERV RAD  . IR US GUIDE VASC ACCESS RIGHT  11/22/2016  . IR US GUIDE VASC ACCESS RIGHT  12/07/2016  . LAPAROSCOPIC RIGHT COLECTOMY Right 10/26/2016  Procedure: LAPAROSCOPIC RIGHT COLECTOMY;  Surgeon: Robert Bellow, MD;  Location: [facility] ORS;  Service: General;  Laterality: Right;  . OOPHORECTOMY    . PORTACATH PLACEMENT Left 10/01/2015   Procedure: INSERTION PORT-A-CATH;  Surgeon: Robert Bellow, MD;  Location: [facility] ORS;  Service: General;  Laterality: Left;  . ROTATOR CUFF REPAIR Right 2008   right shoulder.  [place]     SOCIAL HISTORY: She lives at home with her family in [place]. 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 Social History   Social History  . Marital status: Married    Spouse name: N/A  . Number of children: N/A  . Years of education: N/A   Occupational History  . Not on file.   Social History Main Topics  . Smoking status: Never Smoker  . Smokeless tobacco: Never Used  . Alcohol use 0.0 oz/week     Comment: occasionally  . Drug use: No  . Sexual activity: Not Currently   Other Topics Concern  . Not on file   Social History Narrative  . No narrative on file    FAMILY HISTORY: Family History  Problem Relation Age of Onset  . Mental illness Mother 67       bipolar, dementia  . Cancer Maternal Aunt 70       breast ca  . Hypertension Father   . Stroke Father 14       deceased  . COPD Father   . Heart disease Sister        deceased    ALLERGIES:  is allergic to bee venom; dilaudid [hydromorphone hcl]; and morphine and related.  MEDICATIONS:  Current Outpatient Prescriptions  Medication Sig Dispense Refill  . ALPRAZolam (XANAX) 0.25 MG tablet TAKE 1 TABLET TWICE DAILY AS NEEDED FOR ANXIETY OR SLEEP 60 tablet 1  . amLODipine (NORVASC) 5 MG tablet TAKE ONE TABLET BY MOUTH EVERY DAY 30 tablet 2  . Biotin w/ Vitamins C & E (HAIR/SKIN/NAILS PO) Take 1 tablet by mouth daily.    . Chlorpheniramine Maleate (CHLOR-TABLETS PO) Take 1 tablet by mouth every evening.    . lidocaine-prilocaine (EMLA) cream Apply 1 application topically as needed. Apply to port a cath site 1 hour prior to chemotherapy treatments 30 g 6  . loperamide (IMODIUM A-D) 2 MG tablet Take 2 mg by mouth 4 (four) times daily as needed for diarrhea or loose stools.    Marland Kitchen olmesartan (BENICAR) 40 MG tablet TAKE ONE TABLET BY MOUTH EVERY DAY 30 tablet 2  . ondansetron (ZOFRAN) 8 MG tablet Take 1 tablet (8 mg total) by mouth every 8 (eight) hours as needed for nausea or vomiting. 40 tablet 3  . potassium chloride (K-DUR) 10 MEQ tablet  TAKE ONE TABLET BY MOUTH TWICE DAILY 60 tablet 4  . prochlorperazine (COMPAZINE) 10 MG tablet TAKE ONE TABLET BY MOUTH EVERY 6 HOURS AS NEEDED FOR NAUSEA OR VOMITING 30 tablet 2  . diphenoxylate-atropine (LOMOTIL) 2.5-0.025 MG tablet TAKE 1 TABLET FOUR TIMES DAILY AS NEEDEDFOR DIARRHEA OR LOOSE STOOLS (Patient not taking: Reported on 01/12/2017) 30 tablet 3  . naproxen sodium (ANAPROX) 220 MG tablet Take 440 mg by mouth 2 (two) times daily as needed.     No current facility-administered medications for this visit.    Facility-Administered Medications Ordered in Other Visits  Medication Dose Route Frequency Provider Last Rate Last Dose  . heparin lock flush 100 unit/mL  500 Units  Intravenous Once Charlaine Dalton R, MD      . sodium chloride flush (NS) 0.9 % injection 10 mL  10 mL Intravenous PRN [name], MD   10 mL at 11/24/15 0836      .  PHYSICAL EXAMINATION: ECOG PERFORMANCE STATUS: 0 - Asymptomatic  Vitals:   01/26/17 0953 01/26/17 0955  BP: (!) 147/105 (!) 139/95  Pulse: 86 84  Resp: 18   Temp: 97.6 F (36.4 C)    Filed Weights   01/26/17 0953  Weight: 202 lb 3.2 oz (91.7 kg)    GENERAL: Well-nourished well-developed; Alert, no distress and comfortable. Accompanied by her daughter.  EYES: no pallor or icterus OROPHARYNX: no thrush or ulceration; good dentition  NECK: supple, no masses felt LYMPH:  no palpable lymphadenopathy in the cervical, axillary or inguinal regions LUNGS: clear to auscultation and  No wheeze or crackles HEART/CVS: regular rate & rhythm and no murmurs; No lower extremity edema ABDOMEN: abdomen soft, non-tender and normal bowel sounds; positive for hepatomegaly. Musculoskeletal:no cyanosis of digits and no clubbing  PSYCH: alert & oriented x 3 with fluent speech NEURO: no focal motor/sensory deficits SKIN:  no rashes or significant lesions; Mediport is in place.   LABORATORY DATA:  I have reviewed the data as listed Lab Results   Component Value Date   WBC 2.1 (L) 01/26/2017   HGB 11.9 (L) 01/26/2017   HCT 34.0 (L) 01/26/2017   MCV 101.0 (H) 01/26/2017   PLT 148 (L) 01/26/2017    Recent Labs  12/29/16 0945 01/12/17 0929 01/26/17 0929  NA 136 134* 133*  K 3.7 4.0 3.9  CL 105 107 104  CO2 23 23 23   GLUCOSE 99 97 83  BUN 17 11 14   CREATININE 0.96 0.95 0.86  CALCIUM 8.9 9.1 8.7*  GFRNONAA >60 >60 >60  GFRAA >60 >60 >60  PROT 7.8 7.9 7.4  ALBUMIN 3.7 3.8 3.5  AST 32 41 48*  ALT 24 32 43  ALKPHOS 95 101 122  BILITOT 0.5 0.5 0.3    RADIOGRAPHIC STUDIES: I have personally reviewed the radiological images as listed and agreed with the findings in the report. No results found.  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 Patient tolerated chemotherapy fairly well.   # Proceed with FOLFOX plus Avastin chemo today; Labs today reviewed;  acceptable for treatment today. Total white count 2.1 and ANC is 1.1. We will order CT scan at next visit.     # fatigue- sec to cumulative effects of chemo.   # follow up in 2 weeks/4 weeks/labs/CEA. Granix next week [13th thru 16th ]; plan scan in early Sep 2018.     [name], MD 01/30/2017 10:30 PM

## 2017-01-26 NOTE — Progress Notes (Signed)
1.1 today. Per Dr Rogue Bussing may proceed with treatment today.

## 2017-01-26 NOTE — Assessment & Plan Note (Addendum)
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 Patient tolerated chemotherapy fairly well.   # Proceed with FOLFOX plus Avastin chemo today; Labs today reviewed;  acceptable for treatment today. Total white count 2.1 and ANC is 1.1. We will order CT scan at next visit.     # fatigue- sec to cumulative effects of chemo.   # follow up in 2 weeks/4 weeks/labs/CEA. Granix next week [13th thru 16th ]; plan scan in early Sep 2018.

## 2017-01-27 ENCOUNTER — Encounter: Payer: Self-pay | Admitting: *Deleted

## 2017-01-27 LAB — CEA: CEA: 398.7 ng/mL — ABNORMAL HIGH (ref 0.0–4.7)

## 2017-01-27 NOTE — Progress Notes (Signed)
Per pt request, prior auth submitted for zofran for patient Kathleen James via [email or web address] - (Clinical Key: [id number]) - [id number]

## 2017-01-28 ENCOUNTER — Inpatient Hospital Stay: Payer: Medicare HMO

## 2017-01-28 VITALS — BP 124/84 | HR 82 | Temp 97.0°F | Resp 18

## 2017-01-28 DIAGNOSIS — C189 Malignant neoplasm of colon, unspecified: Secondary | ICD-10-CM

## 2017-01-28 DIAGNOSIS — Z5111 Encounter for antineoplastic chemotherapy: Secondary | ICD-10-CM

## 2017-01-28 DIAGNOSIS — C799 Secondary malignant neoplasm of unspecified site: Secondary | ICD-10-CM

## 2017-01-28 DIAGNOSIS — C182 Malignant neoplasm of ascending colon: Secondary | ICD-10-CM

## 2017-01-28 MED ORDER — HEPARIN SOD (PORK) LOCK FLUSH 100 UNIT/ML IV SOLN
500.0000 [IU] | Freq: Once | INTRAVENOUS | Status: AC | PRN
  Administered 2017-01-28: 500 [IU]
  Filled 2017-01-28: qty 5

## 2017-01-28 MED ORDER — SODIUM CHLORIDE 0.9% FLUSH
10.0000 mL | INTRAVENOUS | Status: DC | PRN
  Administered 2017-01-28: 10 mL
  Filled 2017-01-28: qty 10

## 2017-01-31 ENCOUNTER — Inpatient Hospital Stay: Payer: Medicare HMO

## 2017-01-31 DIAGNOSIS — C182 Malignant neoplasm of ascending colon: Secondary | ICD-10-CM

## 2017-01-31 DIAGNOSIS — Z5111 Encounter for antineoplastic chemotherapy: Secondary | ICD-10-CM | POA: Diagnosis not present

## 2017-01-31 MED ORDER — TBO-FILGRASTIM 480 MCG/0.8ML ~~LOC~~ SOSY
480.0000 ug | PREFILLED_SYRINGE | Freq: Once | SUBCUTANEOUS | Status: AC
  Administered 2017-01-31: 480 ug via SUBCUTANEOUS
  Filled 2017-01-31: qty 0.8

## 2017-02-01 ENCOUNTER — Inpatient Hospital Stay: Payer: Medicare HMO

## 2017-02-01 DIAGNOSIS — Z5111 Encounter for antineoplastic chemotherapy: Secondary | ICD-10-CM | POA: Diagnosis not present

## 2017-02-01 DIAGNOSIS — C182 Malignant neoplasm of ascending colon: Secondary | ICD-10-CM

## 2017-02-01 MED ORDER — TBO-FILGRASTIM 480 MCG/0.8ML ~~LOC~~ SOSY
480.0000 ug | PREFILLED_SYRINGE | Freq: Once | SUBCUTANEOUS | Status: AC
  Administered 2017-02-01: 480 ug via SUBCUTANEOUS
  Filled 2017-02-01: qty 0.8

## 2017-02-02 ENCOUNTER — Inpatient Hospital Stay: Payer: Medicare HMO

## 2017-02-02 DIAGNOSIS — C182 Malignant neoplasm of ascending colon: Secondary | ICD-10-CM

## 2017-02-02 DIAGNOSIS — Z5111 Encounter for antineoplastic chemotherapy: Secondary | ICD-10-CM | POA: Diagnosis not present

## 2017-02-02 MED ORDER — TBO-FILGRASTIM 480 MCG/0.8ML ~~LOC~~ SOSY
480.0000 ug | PREFILLED_SYRINGE | Freq: Once | SUBCUTANEOUS | Status: AC
  Administered 2017-02-02: 480 ug via SUBCUTANEOUS
  Filled 2017-02-02: qty 0.8

## 2017-02-09 ENCOUNTER — Inpatient Hospital Stay (HOSPITAL_BASED_OUTPATIENT_CLINIC_OR_DEPARTMENT_OTHER): Payer: Medicare HMO | Admitting: Internal Medicine

## 2017-02-09 ENCOUNTER — Inpatient Hospital Stay: Payer: Medicare HMO

## 2017-02-09 ENCOUNTER — Other Ambulatory Visit: Payer: Self-pay | Admitting: Internal Medicine

## 2017-02-09 VITALS — BP 145/80 | HR 97 | Temp 97.4°F | Resp 16 | Wt 204.2 lb

## 2017-02-09 DIAGNOSIS — C799 Secondary malignant neoplasm of unspecified site: Secondary | ICD-10-CM

## 2017-02-09 DIAGNOSIS — C182 Malignant neoplasm of ascending colon: Secondary | ICD-10-CM

## 2017-02-09 DIAGNOSIS — F419 Anxiety disorder, unspecified: Secondary | ICD-10-CM | POA: Diagnosis not present

## 2017-02-09 DIAGNOSIS — E669 Obesity, unspecified: Secondary | ICD-10-CM

## 2017-02-09 DIAGNOSIS — Z5111 Encounter for antineoplastic chemotherapy: Secondary | ICD-10-CM

## 2017-02-09 DIAGNOSIS — Z79899 Other long term (current) drug therapy: Secondary | ICD-10-CM

## 2017-02-09 DIAGNOSIS — Z7689 Persons encountering health services in other specified circumstances: Secondary | ICD-10-CM | POA: Diagnosis not present

## 2017-02-09 DIAGNOSIS — I10 Essential (primary) hypertension: Secondary | ICD-10-CM | POA: Diagnosis not present

## 2017-02-09 DIAGNOSIS — C787 Secondary malignant neoplasm of liver and intrahepatic bile duct: Secondary | ICD-10-CM

## 2017-02-09 DIAGNOSIS — R5383 Other fatigue: Secondary | ICD-10-CM | POA: Diagnosis not present

## 2017-02-09 DIAGNOSIS — D701 Agranulocytosis secondary to cancer chemotherapy: Secondary | ICD-10-CM | POA: Diagnosis not present

## 2017-02-09 DIAGNOSIS — R1084 Generalized abdominal pain: Secondary | ICD-10-CM

## 2017-02-09 DIAGNOSIS — C779 Secondary and unspecified malignant neoplasm of lymph node, unspecified: Secondary | ICD-10-CM

## 2017-02-09 DIAGNOSIS — C189 Malignant neoplasm of colon, unspecified: Secondary | ICD-10-CM

## 2017-02-09 DIAGNOSIS — R970 Elevated carcinoembryonic antigen [CEA]: Secondary | ICD-10-CM

## 2017-02-09 DIAGNOSIS — R69 Illness, unspecified: Secondary | ICD-10-CM | POA: Diagnosis not present

## 2017-02-09 LAB — URINALYSIS, COMPLETE (UACMP) WITH MICROSCOPIC
Bilirubin Urine: NEGATIVE
GLUCOSE, UA: NEGATIVE mg/dL
KETONES UR: NEGATIVE mg/dL
LEUKOCYTES UA: NEGATIVE
NITRITE: NEGATIVE
PH: 5 (ref 5.0–8.0)
PROTEIN: NEGATIVE mg/dL
SPECIFIC GRAVITY, URINE: 1.018 (ref 1.005–1.030)

## 2017-02-09 LAB — CBC WITH DIFFERENTIAL/PLATELET
Basophils Absolute: 0 10*3/uL (ref 0–0.1)
Basophils Relative: 1 %
EOS ABS: 0.1 10*3/uL (ref 0–0.7)
EOS PCT: 3 %
HCT: 34.7 % — ABNORMAL LOW (ref 35.0–47.0)
Hemoglobin: 12.2 g/dL (ref 12.0–16.0)
LYMPHS ABS: 0.6 10*3/uL — AB (ref 1.0–3.6)
Lymphocytes Relative: 28 %
MCH: 35.4 pg — AB (ref 26.0–34.0)
MCHC: 35.2 g/dL (ref 32.0–36.0)
MCV: 100.6 fL — ABNORMAL HIGH (ref 80.0–100.0)
MONO ABS: 0.3 10*3/uL (ref 0.2–0.9)
MONOS PCT: 15 %
Neutro Abs: 1.1 10*3/uL — ABNORMAL LOW (ref 1.4–6.5)
Neutrophils Relative %: 53 %
PLATELETS: 120 10*3/uL — AB (ref 150–440)
RBC: 3.45 MIL/uL — ABNORMAL LOW (ref 3.80–5.20)
RDW: 15.3 % — ABNORMAL HIGH (ref 11.5–14.5)
WBC: 2.1 10*3/uL — ABNORMAL LOW (ref 3.6–11.0)

## 2017-02-09 LAB — COMPREHENSIVE METABOLIC PANEL
ALK PHOS: 129 U/L — AB (ref 38–126)
ALT: 35 U/L (ref 14–54)
ANION GAP: 9 (ref 5–15)
AST: 47 U/L — ABNORMAL HIGH (ref 15–41)
Albumin: 3.8 g/dL (ref 3.5–5.0)
BUN: 12 mg/dL (ref 6–20)
CALCIUM: 9.2 mg/dL (ref 8.9–10.3)
CO2: 21 mmol/L — AB (ref 22–32)
Chloride: 106 mmol/L (ref 101–111)
Creatinine, Ser: 0.97 mg/dL (ref 0.44–1.00)
GFR calc non Af Amer: 60 mL/min — ABNORMAL LOW (ref >60)
Glucose, Bld: 105 mg/dL — ABNORMAL HIGH (ref 65–99)
Potassium: 3.8 mmol/L (ref 3.5–5.1)
SODIUM: 136 mmol/L (ref 135–145)
Total Bilirubin: 0.6 mg/dL (ref 0.3–1.2)
Total Protein: 7.8 g/dL (ref 6.5–8.1)

## 2017-02-09 MED ORDER — LEUCOVORIN CALCIUM INJECTION 350 MG
800.0000 mg | Freq: Once | INTRAVENOUS | Status: AC
  Administered 2017-02-09: 800 mg via INTRAVENOUS
  Filled 2017-02-09: qty 25

## 2017-02-09 MED ORDER — SODIUM CHLORIDE 0.9% FLUSH
10.0000 mL | INTRAVENOUS | Status: DC | PRN
  Administered 2017-02-09: 10 mL
  Filled 2017-02-09: qty 10

## 2017-02-09 MED ORDER — DEXTROSE 5 % IV SOLN
Freq: Once | INTRAVENOUS | Status: AC
  Administered 2017-02-09: 13:00:00 via INTRAVENOUS
  Filled 2017-02-09: qty 1000

## 2017-02-09 MED ORDER — PALONOSETRON HCL INJECTION 0.25 MG/5ML
0.2500 mg | Freq: Once | INTRAVENOUS | Status: AC
  Administered 2017-02-09: 0.25 mg via INTRAVENOUS
  Filled 2017-02-09: qty 5

## 2017-02-09 MED ORDER — SODIUM CHLORIDE 0.9 % IV SOLN
Freq: Once | INTRAVENOUS | Status: AC
  Administered 2017-02-09: 11:00:00 via INTRAVENOUS
  Filled 2017-02-09: qty 100

## 2017-02-09 MED ORDER — SODIUM CHLORIDE 0.9 % IV SOLN
5.0000 mg/kg | Freq: Once | INTRAVENOUS | Status: AC
  Administered 2017-02-09: 450 mg via INTRAVENOUS
  Filled 2017-02-09: qty 16

## 2017-02-09 MED ORDER — FOSAPREPITANT DIMEGLUMINE INJECTION 150 MG
Freq: Once | INTRAVENOUS | Status: AC
  Administered 2017-02-09: 12:00:00 via INTRAVENOUS
  Filled 2017-02-09: qty 5

## 2017-02-09 MED ORDER — SODIUM CHLORIDE 0.9 % IV SOLN
2400.0000 mg/m2 | INTRAVENOUS | Status: DC
  Administered 2017-02-09: 4850 mg via INTRAVENOUS
  Filled 2017-02-09: qty 97

## 2017-02-09 MED ORDER — OXALIPLATIN CHEMO INJECTION 100 MG/20ML
85.0000 mg/m2 | Freq: Once | INTRAVENOUS | Status: AC
  Administered 2017-02-09: 170 mg via INTRAVENOUS
  Filled 2017-02-09: qty 34

## 2017-02-09 MED ORDER — SODIUM CHLORIDE 0.9 % IV SOLN
INTRAVENOUS | Status: DC
  Administered 2017-02-09: 11:00:00 via INTRAVENOUS
  Filled 2017-02-09: qty 1000

## 2017-02-09 MED ORDER — LORAZEPAM 2 MG/ML IJ SOLN
1.0000 mg | Freq: Once | INTRAMUSCULAR | Status: AC
  Administered 2017-02-09: 1 mg via INTRAVENOUS
  Filled 2017-02-09: qty 1

## 2017-02-09 MED ORDER — HEPARIN SOD (PORK) LOCK FLUSH 100 UNIT/ML IV SOLN
500.0000 [IU] | Freq: Once | INTRAVENOUS | Status: DC | PRN
  Filled 2017-02-09: qty 5

## 2017-02-09 NOTE — Progress Notes (Signed)
ANC 1.1, Ok per Dr B to proceed with tx today.

## 2017-02-09 NOTE — Assessment & Plan Note (Addendum)
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 Patient tolerated chemotherapy fairly well. CEA improving.  # Proceed with FOLFOX plus Avastin chemo today; Labs today reviewed;  acceptable for treatment today. Total white count 2.1 and ANC is 1.1.    #Chemotherapy-induced neutropenia- plan Granix. Post chemotherapy.  # follow up in sep 10th/labs-chemo/labs/CEA. Granix next week [27th thru 29th ];CT scan prior.   Addendum: Growth factor-Neulasta/On pro would be given as prophylaxis for chemotherapy-induced neutropenia to prevent febrile neutropenias.

## 2017-02-09 NOTE — Progress Notes (Signed)
[place] NOTE  [name], [name], MD (Internal Medicine) Bary Castilla, Forest Gleason, [name], RN as Registered Nurse [name], MD as Consulting Physician (Internal Medicine)  CHIEF COMPLAINTS/PURPOSE OF CONSULTATION:   Oncology History   # MARCH/APRIL 2017-NEUROENDOCRINE CA STAGE IV; [name]; [name]; [name]; [name]; [name]; [name]; [name]; July 14th  2017 CT- PR  # AUG 28th- FOLFIRI +Avastin; OCT 5th CT scan- PR.   # MARCH 20th- CT STABLE liver lesions; ? Enlarging cecal lesion [CEA rising]; March 26th- FOLFOX [last avastin march 12th 2018];   # resection of primary tumor [Dr.Byrnett]; y90- June 19th 2018.  # July 11th-FOLFOX   # FOUNDATION ONE- B-RAF V600E- MUTATED [May 8338]  # right kidney lesion 2.6 x 1.6 cm ? Angiolipoma [followed by Urology in past]     Malignant neoplasm of ascending colon ([place])     HISTORY OF PRESENTING ILLNESS:  Kathleen James 66 y.o.  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  Patient states her blood pressures are better controlled at home. Continues to have fatigue postchemotherapy. Otherwise denies any significant tingling and numbness. Denies any shortness of breath. Denies any cough. Denies any headaches. No epistaxis.  ROS: A complete 10 point review of system is done which is negative except mentioned above in history of present illness  MEDICAL HISTORY:  Past Medical  History:  Diagnosis Date  . Angiolipoma of kidney    followed with serial CTs ,,  Dr. Jacqlyn Larsen  . Anxiety   . Arthritis   . 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 Chemotherapy induced diarrhea   . Hammer toe   . Hypertension   . Liver cancer ([place])   . Neuro-endocrine carcinoma ([place])   . Obesity (BMI 30-39.9)     SURGICAL HISTORY: Past Surgical History:  Procedure Laterality Date  . ABDOMINAL HYSTERECTOMY  1995   endometriosis, heavy bleeding  . BUNIONECTOMY Bilateral   . CESAREAN SECTION  1985  . COLONOSCOPY WITH PROPOFOL N/A 10/09/2015   Procedure: COLONOSCOPY WITH PROPOFOL;  Surgeon: Robert Bellow, MD;  [facility]: [facility] ENDOSCOPY;  Service: Endoscopy;  Laterality: N/A;  . IR ANGIOGRAM SELECTIVE EACH ADDITIONAL VESSEL  11/22/2016  . IR ANGIOGRAM SELECTIVE EACH ADDITIONAL VESSEL  11/22/2016  . IR ANGIOGRAM SELECTIVE EACH ADDITIONAL VESSEL  11/22/2016  . IR ANGIOGRAM SELECTIVE EACH ADDITIONAL VESSEL  12/07/2016  . IR ANGIOGRAM SELECTIVE EACH ADDITIONAL VESSEL  12/07/2016  . IR ANGIOGRAM VISCERAL SELECTIVE  11/22/2016  . IR ANGIOGRAM VISCERAL SELECTIVE  12/07/2016  . IR EMBO ARTERIAL NOT HEMORR HEMANG INC GUIDE ROADMAPPING  11/22/2016  . IR EMBO TUMOR ORGAN ISCHEMIA INFARCT INC GUIDE ROADMAPPING  12/07/2016  . IR GENERIC HISTORICAL  04/07/2016   IR RADIOLOGIST EVAL & MGMT 04/07/2016 Arne Cleveland, MD [facility] INTERV RAD  . IR US GUIDE VASC ACCESS RIGHT  11/22/2016  . IR US GUIDE VASC ACCESS RIGHT  12/07/2016  . LAPAROSCOPIC RIGHT  COLECTOMY Right 10/26/2016   Procedure: LAPAROSCOPIC RIGHT COLECTOMY;  Surgeon: Robert Bellow, MD;  Location: [facility] ORS;  Service: General;  Laterality: Right;  . OOPHORECTOMY    . PORTACATH PLACEMENT Left 10/01/2015   Procedure: INSERTION PORT-A-CATH;  Surgeon: Robert Bellow, MD;  Location: [facility] ORS;  Service: General;  Laterality: Left;  . ROTATOR CUFF REPAIR Right 2008   right shoulder.  [place]     SOCIAL  HISTORY: She lives at home with her family in [place]. 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 Social History   Social History  . Marital status: Married    Spouse name: N/A  . Number of children: N/A  . Years of education: N/A   Occupational History  . Not on file.   Social History Main Topics  . Smoking status: Never Smoker  . Smokeless tobacco: Never Used  . Alcohol use 0.0 oz/week     Comment: occasionally  . Drug use: No  . Sexual activity: Not Currently   Other Topics Concern  . Not on file   Social History Narrative  . No narrative on file    FAMILY HISTORY: Family History  Problem Relation Age of Onset  . Mental illness Mother 37       bipolar, dementia  . Cancer Maternal Aunt 70       breast ca  . Hypertension Father   . Stroke Father 26       deceased  . COPD Father   . Heart disease Sister        deceased    ALLERGIES:  is allergic to bee venom; dilaudid [hydromorphone hcl]; and morphine and related.  MEDICATIONS:  Current Outpatient Prescriptions  Medication Sig Dispense Refill  . ALPRAZolam (XANAX) 0.25 MG tablet TAKE 1 TABLET TWICE DAILY AS NEEDED FOR ANXIETY OR SLEEP 60 tablet 1  . amLODipine (NORVASC) 5 MG tablet TAKE ONE TABLET BY MOUTH EVERY DAY 30 tablet 2  . Biotin w/ Vitamins C & E (HAIR/SKIN/NAILS PO) Take 1 tablet by mouth daily.    . Chlorpheniramine Maleate (CHLOR-TABLETS PO) Take 1 tablet by mouth every evening.    . diphenoxylate-atropine (LOMOTIL) 2.5-0.025 MG tablet TAKE 1 TABLET FOUR TIMES DAILY AS NEEDEDFOR DIARRHEA OR LOOSE STOOLS 30 tablet 3  . lidocaine-prilocaine (EMLA) cream Apply 1 application topically as needed. Apply to port a cath site 1 hour prior to chemotherapy treatments 30 g 6  . loperamide (IMODIUM A-D) 2 MG tablet Take 2 mg by mouth 4 (four) times daily as needed for diarrhea or loose stools.    . naproxen sodium (ANAPROX) 220 MG tablet Take 440 mg by mouth 2 (two) times  daily as needed.    Marland Kitchen olmesartan (BENICAR) 40 MG tablet TAKE ONE TABLET BY MOUTH EVERY DAY 30 tablet 2  . ondansetron (ZOFRAN) 8 MG tablet Take 1 tablet (8 mg total) by mouth every 8 (eight) hours as needed for nausea or vomiting. 40 tablet 3  . potassium chloride (K-DUR) 10 MEQ tablet TAKE ONE TABLET BY MOUTH TWICE DAILY 60 tablet 4  . prochlorperazine (COMPAZINE) 10 MG tablet TAKE ONE TABLET BY MOUTH EVERY 6 HOURS AS NEEDED FOR NAUSEA OR VOMITING 30 tablet 2   No current facility-administered medications for this visit.    Facility-Administered Medications Ordered in Other Visits  Medication Dose Route Frequency Provider Last Rate Last Dose  . heparin lock flush 100 unit/mL  500 Units Intravenous  Once Charlaine Dalton R, MD      . sodium chloride flush (NS) 0.9 % injection 10 mL  10 mL Intravenous PRN [name], MD   10 mL at 11/24/15 0836      .  PHYSICAL EXAMINATION: ECOG PERFORMANCE STATUS: 0 - Asymptomatic  Vitals:   02/09/17 0931  BP: (!) 145/80  Pulse: 97  Resp: 16  Temp: (!) 97.4 F (36.3 C)   Filed Weights   02/09/17 0931  Weight: 204 lb 3.2 oz (92.6 kg)    GENERAL: Well-nourished well-developed; Alert, no distress and comfortable. Accompanied by her daughter.  EYES: no pallor or icterus OROPHARYNX: no thrush or ulceration; good dentition  NECK: supple, no masses felt LYMPH:  no palpable lymphadenopathy in the cervical, axillary or inguinal regions LUNGS: clear to auscultation and  No wheeze or crackles HEART/CVS: regular rate & rhythm and no murmurs; No lower extremity edema ABDOMEN: abdomen soft, non-tender and normal bowel sounds; positive for hepatomegaly. Musculoskeletal:no cyanosis of digits and no clubbing  PSYCH: alert & oriented x 3 with fluent speech NEURO: no focal motor/sensory deficits SKIN:  no rashes or significant lesions; Mediport is in place.   LABORATORY DATA:  I have reviewed the data as listed Lab Results  Component Value  Date   WBC 2.1 (L) 02/09/2017   HGB 12.2 02/09/2017   HCT 34.7 (L) 02/09/2017   MCV 100.6 (H) 02/09/2017   PLT 120 (L) 02/09/2017    Recent Labs  01/12/17 0929 01/26/17 0929 02/09/17 0902  NA 134* 133* 136  K 4.0 3.9 3.8  CL 107 104 106  CO2 23 23 21*  GLUCOSE 97 83 105*  BUN 11 14 12   CREATININE 0.95 0.86 0.97  CALCIUM 9.1 8.7* 9.2  GFRNONAA >60 >60 60*  GFRAA >60 >60 >60  PROT 7.9 7.4 7.8  ALBUMIN 3.8 3.5 3.8  AST 41 48* 47*  ALT 32 43 35  ALKPHOS 101 122 129*  BILITOT 0.5 0.3 0.6   Results for SADAE, ARRAZOLA (MRN [id number]) as of 02/09/2017 23:23  Ref. Range 09/23/2015 15:04 11/03/2015 08:34 12/10/2015 08:51 12/24/2015 08:50 01/07/2016 09:07 02/02/2016 09:03 03/01/2016 11:00 03/29/2016 09:06 04/26/2016 10:31 05/24/2016 09:50 06/28/2016 08:32 08/09/2016 09:18 08/30/2016 10:13 09/13/2016 09:35 09/27/2016 09:35 10/11/2016 08:38 10/25/2016 08:19 11/04/2016 10:09 11/17/2016 08:29 12/29/2016 11:32 01/12/2017 09:29 01/26/2017 09:31  CEA Latest Ref Range: 0.0 - 4.7 ng/mL 538.7 (H) 860.5 (H) 1,167.0 (H) 658.6 (H) 415.6 (H) 242.1 (H) 173.6 (H) 118.8 (H) 100.6 (H) 91.5 (H) 94.5 (H) 141.5 (H) 181.2 (H) 197.3 (H) 215.3 (H) 254.9 (H) 300.7 (H) 254.9 (H) 268.6 (H)     CEA Latest Ref Range: 0.0 - 4.7 ng/mL                    638.7 (H) 521.8 (H) 398.7 (H)   RADIOGRAPHIC STUDIES: I have personally reviewed the radiological images as listed and agreed with the findings in the report. No results found.  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 Patient tolerated chemotherapy fairly well. CEA improving.  # Proceed with FOLFOX plus Avastin chemo today; Labs today reviewed;  acceptable for treatment today. Total white count 2.1 and ANC is 1.1.    #Chemotherapy-induced neutropenia- plan Granix. Post chemotherapy.  #  follow up in sep 10th/labs-chemo/labs/CEA. Granix next week [27th  thru 29th ];CT scan prior.     [name], MD 02/09/2017 11:23 PM

## 2017-02-10 LAB — CEA: CEA1: 386.8 ng/mL — AB (ref 0.0–4.7)

## 2017-02-11 ENCOUNTER — Inpatient Hospital Stay: Payer: Medicare HMO

## 2017-02-11 VITALS — BP 124/70 | HR 82 | Temp 97.5°F | Resp 18

## 2017-02-11 DIAGNOSIS — C189 Malignant neoplasm of colon, unspecified: Secondary | ICD-10-CM

## 2017-02-11 DIAGNOSIS — Z5111 Encounter for antineoplastic chemotherapy: Secondary | ICD-10-CM | POA: Diagnosis not present

## 2017-02-11 DIAGNOSIS — C799 Secondary malignant neoplasm of unspecified site: Secondary | ICD-10-CM

## 2017-02-11 DIAGNOSIS — C182 Malignant neoplasm of ascending colon: Secondary | ICD-10-CM

## 2017-02-11 MED ORDER — SODIUM CHLORIDE 0.9% FLUSH
10.0000 mL | INTRAVENOUS | Status: DC | PRN
Start: 2017-02-11 — End: 2017-02-11
  Administered 2017-02-11: 10 mL
  Filled 2017-02-11: qty 10

## 2017-02-11 MED ORDER — HEPARIN SOD (PORK) LOCK FLUSH 100 UNIT/ML IV SOLN
500.0000 [IU] | Freq: Once | INTRAVENOUS | Status: AC | PRN
  Administered 2017-02-11: 500 [IU]
  Filled 2017-02-11: qty 5

## 2017-02-14 ENCOUNTER — Inpatient Hospital Stay: Payer: Medicare HMO

## 2017-02-14 DIAGNOSIS — C182 Malignant neoplasm of ascending colon: Secondary | ICD-10-CM

## 2017-02-14 DIAGNOSIS — Z5111 Encounter for antineoplastic chemotherapy: Secondary | ICD-10-CM | POA: Diagnosis not present

## 2017-02-14 MED ORDER — TBO-FILGRASTIM 480 MCG/0.8ML ~~LOC~~ SOSY
480.0000 ug | PREFILLED_SYRINGE | Freq: Once | SUBCUTANEOUS | Status: AC
  Administered 2017-02-14: 480 ug via SUBCUTANEOUS
  Filled 2017-02-14: qty 0.8

## 2017-02-15 ENCOUNTER — Other Ambulatory Visit: Payer: Self-pay | Admitting: Internal Medicine

## 2017-02-15 ENCOUNTER — Inpatient Hospital Stay: Payer: Medicare HMO

## 2017-02-15 DIAGNOSIS — Z5111 Encounter for antineoplastic chemotherapy: Secondary | ICD-10-CM | POA: Diagnosis not present

## 2017-02-15 DIAGNOSIS — C182 Malignant neoplasm of ascending colon: Secondary | ICD-10-CM

## 2017-02-15 MED ORDER — TBO-FILGRASTIM 480 MCG/0.8ML ~~LOC~~ SOSY
480.0000 ug | PREFILLED_SYRINGE | Freq: Once | SUBCUTANEOUS | Status: AC
  Administered 2017-02-15: 480 ug via SUBCUTANEOUS
  Filled 2017-02-15: qty 0.8

## 2017-02-16 ENCOUNTER — Inpatient Hospital Stay: Payer: Medicare HMO

## 2017-02-16 DIAGNOSIS — C182 Malignant neoplasm of ascending colon: Secondary | ICD-10-CM

## 2017-02-16 DIAGNOSIS — Z5111 Encounter for antineoplastic chemotherapy: Secondary | ICD-10-CM | POA: Diagnosis not present

## 2017-02-16 MED ORDER — TBO-FILGRASTIM 480 MCG/0.8ML ~~LOC~~ SOSY
480.0000 ug | PREFILLED_SYRINGE | Freq: Once | SUBCUTANEOUS | Status: AC
  Administered 2017-02-16: 480 ug via SUBCUTANEOUS
  Filled 2017-02-16: qty 0.8

## 2017-02-21 ENCOUNTER — Encounter: Payer: Self-pay | Admitting: Internal Medicine

## 2017-02-24 ENCOUNTER — Ambulatory Visit: Payer: Medicare HMO

## 2017-02-28 ENCOUNTER — Inpatient Hospital Stay: Payer: Medicare HMO

## 2017-02-28 ENCOUNTER — Inpatient Hospital Stay (HOSPITAL_BASED_OUTPATIENT_CLINIC_OR_DEPARTMENT_OTHER): Payer: Medicare HMO | Admitting: Internal Medicine

## 2017-02-28 ENCOUNTER — Inpatient Hospital Stay: Payer: Medicare HMO | Attending: Internal Medicine

## 2017-02-28 ENCOUNTER — Ambulatory Visit
Admission: RE | Admit: 2017-02-28 | Discharge: 2017-02-28 | Disposition: A | Discharge status: Home / Self Care | Payer: Medicare HMO | Source: Ambulatory Visit | Attending: Internal Medicine | Admitting: Internal Medicine

## 2017-02-28 ENCOUNTER — Ambulatory Visit: Admission: RE | Admit: 2017-02-28 | Payer: Medicare HMO | Source: Ambulatory Visit

## 2017-02-28 VITALS — Temp 97.3°F | Resp 18

## 2017-02-28 VITALS — BP 134/88 | HR 74 | Wt 205.8 lb

## 2017-02-28 DIAGNOSIS — C182 Malignant neoplasm of ascending colon: Secondary | ICD-10-CM

## 2017-02-28 DIAGNOSIS — Z5111 Encounter for antineoplastic chemotherapy: Secondary | ICD-10-CM | POA: Insufficient documentation

## 2017-02-28 DIAGNOSIS — C7B8 Other secondary neuroendocrine tumors: Secondary | ICD-10-CM | POA: Diagnosis not present

## 2017-02-28 DIAGNOSIS — C7A1 Malignant poorly differentiated neuroendocrine tumors: Secondary | ICD-10-CM | POA: Diagnosis not present

## 2017-02-28 DIAGNOSIS — Z5112 Encounter for antineoplastic immunotherapy: Secondary | ICD-10-CM | POA: Diagnosis not present

## 2017-02-28 DIAGNOSIS — D1771 Benign lipomatous neoplasm of kidney: Secondary | ICD-10-CM

## 2017-02-28 DIAGNOSIS — M129 Arthropathy, unspecified: Secondary | ICD-10-CM | POA: Diagnosis not present

## 2017-02-28 DIAGNOSIS — R5383 Other fatigue: Secondary | ICD-10-CM

## 2017-02-28 DIAGNOSIS — R1084 Generalized abdominal pain: Secondary | ICD-10-CM | POA: Diagnosis not present

## 2017-02-28 DIAGNOSIS — R970 Elevated carcinoembryonic antigen [CEA]: Secondary | ICD-10-CM

## 2017-02-28 DIAGNOSIS — Z7689 Persons encountering health services in other specified circumstances: Secondary | ICD-10-CM | POA: Insufficient documentation

## 2017-02-28 DIAGNOSIS — Z803 Family history of malignant neoplasm of breast: Secondary | ICD-10-CM

## 2017-02-28 DIAGNOSIS — N289 Disorder of kidney and ureter, unspecified: Secondary | ICD-10-CM | POA: Insufficient documentation

## 2017-02-28 DIAGNOSIS — D1800 Hemangioma unspecified site: Secondary | ICD-10-CM | POA: Insufficient documentation

## 2017-02-28 DIAGNOSIS — R197 Diarrhea, unspecified: Secondary | ICD-10-CM | POA: Diagnosis not present

## 2017-02-28 DIAGNOSIS — R599 Enlarged lymph nodes, unspecified: Secondary | ICD-10-CM

## 2017-02-28 DIAGNOSIS — Z79899 Other long term (current) drug therapy: Secondary | ICD-10-CM | POA: Diagnosis not present

## 2017-02-28 DIAGNOSIS — F419 Anxiety disorder, unspecified: Secondary | ICD-10-CM

## 2017-02-28 DIAGNOSIS — C189 Malignant neoplasm of colon, unspecified: Secondary | ICD-10-CM

## 2017-02-28 DIAGNOSIS — I700 Atherosclerosis of aorta: Secondary | ICD-10-CM

## 2017-02-28 DIAGNOSIS — C799 Secondary malignant neoplasm of unspecified site: Secondary | ICD-10-CM

## 2017-02-28 DIAGNOSIS — D701 Agranulocytosis secondary to cancer chemotherapy: Secondary | ICD-10-CM | POA: Insufficient documentation

## 2017-02-28 DIAGNOSIS — D696 Thrombocytopenia, unspecified: Secondary | ICD-10-CM | POA: Insufficient documentation

## 2017-02-28 DIAGNOSIS — R69 Illness, unspecified: Secondary | ICD-10-CM | POA: Diagnosis not present

## 2017-02-28 DIAGNOSIS — E669 Obesity, unspecified: Secondary | ICD-10-CM | POA: Diagnosis not present

## 2017-02-28 DIAGNOSIS — C787 Secondary malignant neoplasm of liver and intrahepatic bile duct: Secondary | ICD-10-CM | POA: Diagnosis not present

## 2017-02-28 LAB — URINALYSIS, COMPLETE (UACMP) WITH MICROSCOPIC
Bacteria, UA: NONE SEEN
Bilirubin Urine: NEGATIVE
GLUCOSE, UA: NEGATIVE mg/dL
Ketones, ur: NEGATIVE mg/dL
LEUKOCYTES UA: NEGATIVE
Nitrite: NEGATIVE
PH: 6 (ref 5.0–8.0)
Protein, ur: NEGATIVE mg/dL
RBC / HPF: NONE SEEN RBC/hpf (ref 0–5)
Specific Gravity, Urine: 1.033 — ABNORMAL HIGH (ref 1.005–1.030)

## 2017-02-28 LAB — COMPREHENSIVE METABOLIC PANEL
ALBUMIN: 3.8 g/dL (ref 3.5–5.0)
ALK PHOS: 141 U/L — AB (ref 38–126)
ALT: 38 U/L (ref 14–54)
ANION GAP: 7 (ref 5–15)
AST: 51 U/L — ABNORMAL HIGH (ref 15–41)
BILIRUBIN TOTAL: 0.5 mg/dL (ref 0.3–1.2)
BUN: 12 mg/dL (ref 6–20)
CALCIUM: 9.2 mg/dL (ref 8.9–10.3)
CO2: 24 mmol/L (ref 22–32)
Chloride: 102 mmol/L (ref 101–111)
Creatinine, Ser: 1 mg/dL (ref 0.44–1.00)
GFR calc Af Amer: >60 mL/min (ref >60)
GFR, EST NON AFRICAN AMERICAN: 57 mL/min — AB (ref >60)
GLUCOSE: 86 mg/dL (ref 65–99)
POTASSIUM: 4.3 mmol/L (ref 3.5–5.1)
Sodium: 133 mmol/L — ABNORMAL LOW (ref 135–145)
TOTAL PROTEIN: 7.8 g/dL (ref 6.5–8.1)

## 2017-02-28 LAB — CBC WITH DIFFERENTIAL/PLATELET
BASOS PCT: 1 %
Basophils Absolute: 0 10*3/uL (ref 0–0.1)
Eosinophils Absolute: 0.1 10*3/uL (ref 0–0.7)
Eosinophils Relative: 2 %
HEMATOCRIT: 34 % — AB (ref 35.0–47.0)
HEMOGLOBIN: 12 g/dL (ref 12.0–16.0)
LYMPHS ABS: 0.6 10*3/uL — AB (ref 1.0–3.6)
LYMPHS PCT: 18 %
MCH: 35.8 pg — ABNORMAL HIGH (ref 26.0–34.0)
MCHC: 35.3 g/dL (ref 32.0–36.0)
MCV: 101.5 fL — AB (ref 80.0–100.0)
MONO ABS: 0.4 10*3/uL (ref 0.2–0.9)
MONOS PCT: 13 %
Neutro Abs: 2.2 10*3/uL (ref 1.4–6.5)
Neutrophils Relative %: 66 %
Platelets: 216 10*3/uL (ref 150–440)
RBC: 3.35 MIL/uL — ABNORMAL LOW (ref 3.80–5.20)
RDW: 17.6 % — AB (ref 11.5–14.5)
WBC: 3.4 10*3/uL — AB (ref 3.6–11.0)

## 2017-02-28 MED ORDER — SODIUM CHLORIDE 0.9 % IV SOLN
2400.0000 mg/m2 | INTRAVENOUS | Status: DC
  Administered 2017-02-28: 4850 mg via INTRAVENOUS
  Filled 2017-02-28: qty 97

## 2017-02-28 MED ORDER — DEXTROSE 5 % IV SOLN
Freq: Once | INTRAVENOUS | Status: AC
  Administered 2017-02-28: 13:00:00 via INTRAVENOUS
  Filled 2017-02-28: qty 1000

## 2017-02-28 MED ORDER — SODIUM CHLORIDE 0.9 % IV SOLN
5.0000 mg/kg | Freq: Once | INTRAVENOUS | Status: AC
  Administered 2017-02-28: 450 mg via INTRAVENOUS
  Filled 2017-02-28: qty 16

## 2017-02-28 MED ORDER — SODIUM CHLORIDE 0.9 % IV SOLN
Freq: Once | INTRAVENOUS | Status: AC
  Administered 2017-02-28: 11:00:00 via INTRAVENOUS
  Filled 2017-02-28: qty 1000

## 2017-02-28 MED ORDER — SODIUM CHLORIDE 0.9 % IV SOLN
Freq: Once | INTRAVENOUS | Status: AC
  Administered 2017-02-28: 11:00:00 via INTRAVENOUS
  Filled 2017-02-28: qty 5

## 2017-02-28 MED ORDER — SODIUM CHLORIDE 0.9 % IV SOLN
Freq: Once | INTRAVENOUS | Status: AC
  Administered 2017-02-28: 11:00:00 via INTRAVENOUS
  Filled 2017-02-28: qty 100

## 2017-02-28 MED ORDER — LEUCOVORIN CALCIUM INJECTION 350 MG
800.0000 mg | Freq: Once | INTRAVENOUS | Status: AC
  Administered 2017-02-28: 800 mg via INTRAVENOUS
  Filled 2017-02-28: qty 35

## 2017-02-28 MED ORDER — PALONOSETRON HCL INJECTION 0.25 MG/5ML
0.2500 mg | Freq: Once | INTRAVENOUS | Status: AC
  Administered 2017-02-28: 0.25 mg via INTRAVENOUS
  Filled 2017-02-28: qty 5

## 2017-02-28 MED ORDER — IOPAMIDOL (ISOVUE-300) INJECTION 61%
100.0000 mL | Freq: Once | INTRAVENOUS | Status: AC | PRN
Start: 2017-02-28 — End: 2017-02-28
  Administered 2017-02-28: 100 mL via INTRAVENOUS

## 2017-02-28 MED ORDER — LORAZEPAM 2 MG/ML IJ SOLN
1.0000 mg | Freq: Once | INTRAMUSCULAR | Status: AC
  Administered 2017-02-28: 1 mg via INTRAVENOUS
  Filled 2017-02-28: qty 1

## 2017-02-28 MED ORDER — OXALIPLATIN CHEMO INJECTION 100 MG/20ML
85.0000 mg/m2 | Freq: Once | INTRAVENOUS | Status: AC
  Administered 2017-02-28: 170 mg via INTRAVENOUS
  Filled 2017-02-28: qty 34

## 2017-02-28 NOTE — Progress Notes (Signed)
[place] [facility] NOTE  [name], [name], MD (Internal Medicine) Bary Castilla, Forest Gleason, [name], RN as Registered Nurse [name], MD as Consulting Physician (Internal Medicine)  CHIEF COMPLAINTS/PURPOSE OF CONSULTATION:   Oncology History   # MARCH/APRIL 2017-NEUROENDOCRINE CA STAGE IV; [name]; [name]; [name]; [name]; [name]; [name]; [name]; July 14th  2017 CT- PR  # AUG 28th- FOLFIRI +Avastin; OCT 5th CT scan- PR.   # MARCH 20th- CT STABLE liver lesions; ? Enlarging cecal lesion [CEA rising]; March 26th- FOLFOX [last avastin march 12th 2018];   # resection of primary tumor [Dr.Byrnett]; y90- June 19th 2018.  # July 11th-FOLFOX   # [facility]- B-RAF V600E- MUTATED [May 2297]  # right kidney lesion 2.6 x 1.6 cm ? Angiolipoma [followed by Urology in past]     Malignant neoplasm of ascending colon ([place])     HISTORY OF PRESENTING ILLNESS:  Kathleen James 66 y.o.  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   Patient had CT scans done this morning.  She had a good vacation to the beach last week.  Continues to have fatigue postchemotherapy- tolerable. Otherwise denies any significant tingling and numbness. Denies any shortness of breath. Denies any cough. Denies any headaches.   ROS: A complete 10 point review of system is done which is negative except mentioned above in history of present illness  MEDICAL HISTORY:  Past Medical History:  Diagnosis Date  . Angiolipoma of  kidney    followed with serial CTs ,,  Dr. Jacqlyn Larsen  . Anxiety   . Arthritis   . 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 Chemotherapy induced diarrhea   . Hammer toe   . Hypertension   . Liver cancer ([place])   . Neuro-endocrine carcinoma ([place])   . Obesity (BMI 30-39.9)     SURGICAL HISTORY: Past Surgical History:  Procedure Laterality Date  . ABDOMINAL HYSTERECTOMY  1995   endometriosis, heavy bleeding  . BUNIONECTOMY Bilateral   . CESAREAN SECTION  1985  . COLONOSCOPY WITH PROPOFOL N/A 10/09/2015   Procedure: COLONOSCOPY WITH PROPOFOL;  Surgeon: Robert Bellow, MD;  Location: [facility] ENDOSCOPY;  Service: Endoscopy;  Laterality: N/A;  . IR ANGIOGRAM SELECTIVE EACH ADDITIONAL VESSEL  11/22/2016  . IR ANGIOGRAM SELECTIVE EACH ADDITIONAL VESSEL  11/22/2016  . IR ANGIOGRAM SELECTIVE EACH ADDITIONAL VESSEL  11/22/2016  . IR ANGIOGRAM SELECTIVE EACH ADDITIONAL VESSEL  12/07/2016  . IR ANGIOGRAM SELECTIVE EACH ADDITIONAL VESSEL  12/07/2016  . IR ANGIOGRAM VISCERAL SELECTIVE  11/22/2016  . IR ANGIOGRAM VISCERAL SELECTIVE  12/07/2016  . IR EMBO ARTERIAL NOT HEMORR HEMANG INC GUIDE ROADMAPPING  11/22/2016  . IR EMBO TUMOR ORGAN ISCHEMIA INFARCT INC GUIDE ROADMAPPING  12/07/2016  . IR GENERIC HISTORICAL  04/07/2016   IR RADIOLOGIST EVAL & MGMT 04/07/2016 Arne [place], MD [facility] INTERV RAD  . IR US GUIDE VASC ACCESS RIGHT  11/22/2016  . IR US GUIDE VASC ACCESS RIGHT  12/07/2016  . LAPAROSCOPIC RIGHT COLECTOMY Right  10/26/2016   Procedure: LAPAROSCOPIC RIGHT COLECTOMY;  Surgeon: Robert Bellow, MD;  Location: [facility] ORS;  Service: General;  Laterality: Right;  . OOPHORECTOMY    . PORTACATH PLACEMENT Left 10/01/2015   Procedure: INSERTION PORT-A-CATH;  Surgeon: Robert Bellow, MD;  Location: [facility] ORS;  Service: General;  Laterality: Left;  . ROTATOR CUFF REPAIR Right 2008   right shoulder.  [place]     SOCIAL HISTORY: She lives at home with her family in  [place]. 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 Social History   Social History  . Marital status: Married    Spouse name: N/A  . Number of children: N/A  . Years of education: N/A   Occupational History  . Not on file.   Social History Main Topics  . Smoking status: Never Smoker  . Smokeless tobacco: Never Used  . Alcohol use 0.0 oz/week     Comment: occasionally  . Drug use: No  . Sexual activity: Not Currently   Other Topics Concern  . Not on file   Social History Narrative  . No narrative on file    FAMILY HISTORY: Family History  Problem Relation Age of Onset  . Mental illness Mother 34       bipolar, dementia  . Cancer Maternal Aunt 70       breast ca  . Hypertension Father   . Stroke Father 71       deceased  . COPD Father   . Heart disease Sister        deceased    ALLERGIES:  is allergic to bee venom; dilaudid [hydromorphone hcl]; and morphine and related.  MEDICATIONS:  Current Outpatient Prescriptions  Medication Sig Dispense Refill  . ALPRAZolam (XANAX) 0.25 MG tablet TAKE 1 TABLET TWICE DAILY AS NEEDED FOR ANXIETY OR SLEEP 60 tablet 1  . amLODipine (NORVASC) 5 MG tablet TAKE ONE TABLET BY MOUTH EVERY DAY 30 tablet 2  . Biotin w/ Vitamins C & E (HAIR/SKIN/NAILS PO) Take 1 tablet by mouth daily.    . Chlorpheniramine Maleate (CHLOR-TABLETS PO) Take 1 tablet by mouth every evening.    . lidocaine-prilocaine (EMLA) cream Apply 1 application topically as needed. Apply to port a cath site 1 hour prior to chemotherapy treatments 30 g 6  . loperamide (IMODIUM A-D) 2 MG tablet Take 2 mg by mouth 4 (four) times daily as needed for diarrhea or loose stools.    . naproxen sodium (ANAPROX) 220 MG tablet Take 440 mg by mouth 2 (two) times daily as needed.    Marland Kitchen olmesartan (BENICAR) 40 MG tablet TAKE ONE TABLET BY MOUTH EVERY DAY 30 tablet 2  . ondansetron (ZOFRAN) 8 MG tablet Take 1 tablet (8 mg total) by mouth every 8  (eight) hours as needed for nausea or vomiting. 40 tablet 3  . potassium chloride (K-DUR) 10 MEQ tablet TAKE ONE TABLET BY MOUTH TWICE DAILY 60 tablet 4  . diphenoxylate-atropine (LOMOTIL) 2.5-0.025 MG tablet TAKE 1 TABLET FOUR TIMES DAILY AS NEEDEDFOR DIARRHEA OR LOOSE STOOLS (Patient not taking: Reported on 02/28/2017) 30 tablet 3  . prochlorperazine (COMPAZINE) 10 MG tablet TAKE ONE TABLET BY MOUTH EVERY 6 HOURS AS NEEDED FOR NAUSEA OR VOMITING (Patient not taking: Reported on 02/28/2017) 30 tablet 2   No current facility-administered medications for this visit.    Facility-Administered Medications Ordered in Other Visits  Medication Dose Route Frequency Provider Last Rate Last Dose  .  bevacizumab (AVASTIN) 450 mg in sodium chloride 0.9 % 100 mL chemo infusion  5 mg/kg (Treatment Plan Recorded) Intravenous Once [name], MD 236 mL/hr at 02/28/17 1240 450 mg at 02/28/17 1240  . dextrose 5 % solution   Intravenous Once Charlaine Dalton R, MD      . fluorouracil (ADRUCIL) 4,850 mg in sodium chloride 0.9 % 53 mL chemo infusion  2,400 mg/m2 (Treatment Plan Recorded) Intravenous 1 day or 1 dose Charlaine Dalton R, MD      . heparin lock flush 100 unit/mL  500 Units Intravenous Once Charlaine Dalton R, MD      . leucovorin 800 mg in dextrose 5 % 250 mL infusion  800 mg Intravenous Once Charlaine Dalton R, MD      . oxaliplatin (ELOXATIN) 170 mg in dextrose 5 % 500 mL chemo infusion  85 mg/m2 (Treatment Plan Recorded) Intravenous Once Charlaine Dalton R, MD      . sodium chloride flush (NS) 0.9 % injection 10 mL  10 mL Intravenous PRN [name], MD   10 mL at 11/24/15 0836      .  PHYSICAL EXAMINATION: ECOG PERFORMANCE STATUS: 0 - Asymptomatic  Vitals:   02/28/17 0921  BP: 134/88  Pulse: 74   Filed Weights   02/28/17 0921  Weight: 205 lb 12.8 oz (93.4 kg)    GENERAL: Well-nourished well-developed; Alert, no distress and comfortable. Accompanied by  her daughter.  EYES: no pallor or icterus OROPHARYNX: no thrush or ulceration; good dentition  NECK: supple, no masses felt LYMPH:  no palpable lymphadenopathy in the cervical, axillary or inguinal regions LUNGS: clear to auscultation and  No wheeze or crackles HEART/CVS: regular rate & rhythm and no murmurs; No lower extremity edema ABDOMEN: abdomen soft, non-tender and normal bowel sounds; positive for hepatomegaly. Musculoskeletal:no cyanosis of digits and no clubbing  PSYCH: alert & oriented x 3 with fluent speech NEURO: no focal motor/sensory deficits SKIN:  no rashes or significant lesions; Mediport is in place.   LABORATORY DATA:  I have reviewed the data as listed Lab Results  Component Value Date   WBC 3.4 (L) 02/28/2017   HGB 12.0 02/28/2017   HCT 34.0 (L) 02/28/2017   MCV 101.5 (H) 02/28/2017   PLT 216 02/28/2017    Recent Labs  01/26/17 0929 02/09/17 0902 02/28/17 0904  NA 133* 136 133*  K 3.9 3.8 4.3  CL 104 106 102  CO2 23 21* 24  GLUCOSE 83 105* 86  BUN 14 12 12   CREATININE 0.86 0.97 1.00  CALCIUM 8.7* 9.2 9.2  GFRNONAA >60 60* 57*  GFRAA >60 >60 >60  PROT 7.4 7.8 7.8  ALBUMIN 3.5 3.8 3.8  AST 48* 47* 51*  ALT 43 35 38  ALKPHOS 122 129* 141*  BILITOT 0.3 0.6 0.5   Results for TAMERIA, PATTI (MRN [id number]) as of 02/09/2017 23:23  Ref. Range 09/23/2015 15:04 11/03/2015 08:34 12/10/2015 08:51 12/24/2015 08:50 01/07/2016 09:07 02/02/2016 09:03 03/01/2016 11:00 03/29/2016 09:06 04/26/2016 10:31 05/24/2016 09:50 06/28/2016 08:32 08/09/2016 09:18 08/30/2016 10:13 09/13/2016 09:35 09/27/2016 09:35 10/11/2016 08:38 10/25/2016 08:19 11/04/2016 10:09 11/17/2016 08:29 12/29/2016 11:32 01/12/2017 09:29 01/26/2017 09:31  CEA Latest Ref Range: 0.0 - 4.7 ng/mL 538.7 (H) 860.5 (H) 1,167.0 (H) 658.6 (H) 415.6 (H) 242.1 (H) 173.6 (H) 118.8 (H) 100.6 (H) 91.5 (H) 94.5 (H) 141.5 (H) 181.2 (H) 197.3 (H) 215.3 (H) 254.9 (H) 300.7 (H) 254.9 (H) 268.6 (H)     CEA Latest Ref Range: 0.0 -  4.7 ng/mL                     638.7 (H) 521.8 (H) 398.7 (H)   RADIOGRAPHIC STUDIES: I have personally reviewed the radiological images as listed and agreed with the findings in the report. Ct Chest W Contrast  Result Date: 02/28/2017 CLINICAL DATA:  Restaging colon cancer status post partial right colectomy 10/26/2016. Y-90 therapy 12/07/2016. EXAM: CT CHEST, ABDOMEN, AND PELVIS WITH CONTRAST TECHNIQUE: Multidetector CT imaging of the chest, abdomen and pelvis was performed following the standard protocol during bolus administration of intravenous contrast. CONTRAST:  189mL ISOVUE-300 IOPAMIDOL (ISOVUE-300) INJECTION 61% COMPARISON:  PET-CT 10/06/2015.  Abdominopelvic CT 11/19/2016. FINDINGS: CT CHEST FINDINGS Cardiovascular: No significant vascular findings. Left subclavian Port-A-Cath extends to the lower SVC level. There are calcifications of the mitral annulus. The heart size is normal. There is no pericardial effusion. Mediastinum/Nodes: There are no enlarged mediastinal, hilar or axillary lymph nodes. The thyroid gland, trachea and esophagus demonstrate no significant findings. Lungs/Pleura: There is no pleural effusion. The lungs are clear. Musculoskeletal/Chest wall: No chest wall mass or suspicious osseous findings. CT ABDOMEN AND PELVIS FINDINGS Hepatobiliary: The dominant metastasis in the dome of the right hepatic lobe has decreased in size and attenuation, consistent with partial treatment. This measures 4.3 x 3.0 cm on image 49 of series 2 (previously 6.0 x 3.6 cm). Other suspected metastases on the prior studies have also decreased in size. No new or enlarging lesions are seen. There are several hepatic cysts as well as a 14 mm hemangioma inferiorly in the right lobe (image 71). No evidence of gallstones, gallbladder wall thickening or biliary dilatation. Pancreas: Unremarkable. No pancreatic ductal dilatation or surrounding inflammatory changes. Spleen: Normal in size without focal abnormality.  Adrenals/Urinary Tract: Both adrenal glands appear normal. There is a grossly stable angiomyolipoma involving the posterior aspect of the mid right kidney, measuring 2.5 x 1.5 cm on image 66. No new or enlarging renal lesions are identified. There is no evidence of urinary tract calculus or hydronephrosis. The bladder appears unremarkable. Stomach/Bowel: No evidence of bowel wall thickening, distention or surrounding inflammatory change. Stable postsurgical changes from right hemicolectomy and anastomosis. Vascular/Lymphatic: Further slight enlargement in an aortocaval node measuring 9 mm on image 66 (previously 8 mm). Small mesenteric lymph nodes are stable. No acute vascular findings are seen. There is mild aortic atherosclerosis. Previously embolized gastroduodenal artery noted. Reproductive: Hysterectomy.  No evidence of adnexal mass. Other: Stable postsurgical changes in the anterior abdominal wall. No ascites or peritoneal nodularity. Musculoskeletal: No acute or significant osseous findings. Stable lumbar spine degenerative changes and small sclerotic lesion in the L1 vertebral body. IMPRESSION: 1. No significant findings in the chest. 2. Partially treated multifocal hepatic metastatic disease. In addition, there are underlying cysts and a hemangioma. 3. Further slight enlargement of a borderline aortocaval node. No other signs of progressive metastatic disease. 4. Stable right renal angiomyolipoma. Electronically Signed   By: Richardean Sale M.D.   On: 02/28/2017 09:52   Ct Abdomen Pelvis W Contrast  Result Date: 02/28/2017 CLINICAL DATA:  Restaging colon cancer status post partial right colectomy 10/26/2016. Y-90 therapy 12/07/2016. EXAM: CT CHEST, ABDOMEN, AND PELVIS WITH CONTRAST TECHNIQUE: Multidetector CT imaging of the chest, abdomen and pelvis was performed following the standard protocol during bolus administration of intravenous contrast. CONTRAST:  137mL ISOVUE-300 IOPAMIDOL (ISOVUE-300)  INJECTION 61% COMPARISON:  PET-CT 10/06/2015.  Abdominopelvic CT 11/19/2016. FINDINGS: CT CHEST FINDINGS Cardiovascular: No significant vascular findings. Left  subclavian Port-A-Cath extends to the lower SVC level. There are calcifications of the mitral annulus. The heart size is normal. There is no pericardial effusion. Mediastinum/Nodes: There are no enlarged mediastinal, hilar or axillary lymph nodes. The thyroid gland, trachea and esophagus demonstrate no significant findings. Lungs/Pleura: There is no pleural effusion. The lungs are clear. Musculoskeletal/Chest wall: No chest wall mass or suspicious osseous findings. CT ABDOMEN AND PELVIS FINDINGS Hepatobiliary: The dominant metastasis in the dome of the right hepatic lobe has decreased in size and attenuation, consistent with partial treatment. This measures 4.3 x 3.0 cm on image 49 of series 2 (previously 6.0 x 3.6 cm). Other suspected metastases on the prior studies have also decreased in size. No new or enlarging lesions are seen. There are several hepatic cysts as well as a 14 mm hemangioma inferiorly in the right lobe (image 71). No evidence of gallstones, gallbladder wall thickening or biliary dilatation. Pancreas: Unremarkable. No pancreatic ductal dilatation or surrounding inflammatory changes. Spleen: Normal in size without focal abnormality. Adrenals/Urinary Tract: Both adrenal glands appear normal. There is a grossly stable angiomyolipoma involving the posterior aspect of the mid right kidney, measuring 2.5 x 1.5 cm on image 66. No new or enlarging renal lesions are identified. There is no evidence of urinary tract calculus or hydronephrosis. The bladder appears unremarkable. Stomach/Bowel: No evidence of bowel wall thickening, distention or surrounding inflammatory change. Stable postsurgical changes from right hemicolectomy and anastomosis. Vascular/Lymphatic: Further slight enlargement in an aortocaval node measuring 9 mm on image 66 (previously  8 mm). Small mesenteric lymph nodes are stable. No acute vascular findings are seen. There is mild aortic atherosclerosis. Previously embolized gastroduodenal artery noted. Reproductive: Hysterectomy.  No evidence of adnexal mass. Other: Stable postsurgical changes in the anterior abdominal wall. No ascites or peritoneal nodularity. Musculoskeletal: No acute or significant osseous findings. Stable lumbar spine degenerative changes and small sclerotic lesion in the L1 vertebral body. IMPRESSION: 1. No significant findings in the chest. 2. Partially treated multifocal hepatic metastatic disease. In addition, there are underlying cysts and a hemangioma. 3. Further slight enlargement of a borderline aortocaval node. No other signs of progressive metastatic disease. 4. Stable right renal angiomyolipoma. Electronically Signed   By: Richardean Sale M.D.   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 Patient tolerated chemotherapy fairly well. CEA improving. CT Sep 10th- improved metastatic lesions to the liver; slight increase in retroperitoneal lymph nodes from 8-9 mm in size. Otherwise no lesions noted in the chest  # Proceed with FOLFOX plus Avastin chemo today; Labs today reviewed;  acceptable for treatment today. Total white count 3.4 and ANC is 2.1    # Nasuea- improvde with Ativan.  #Chemotherapy-induced neutropenia- plan onpro.   # Renal lesion- likely angiomyolipoma; stable monitor for now.   # follow up in 2 weeks/labs/CEA; UA.  # I reviewed the blood work- with the patient in detail; also reviewed the imaging independently [as summarized above]; and with the patient in detail.       [name], MD 02/28/2017 12:41 PM

## 2017-02-28 NOTE — Progress Notes (Signed)
Patient is here today for a follow up. Patient states no new concerns today.  

## 2017-02-28 NOTE — Progress Notes (Signed)
Mg 2gm and Ca 1gm ordered. Called MD to verify patient should receive as Ca level is 9.2 and no Mg was checked. 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

## 2017-02-28 NOTE — Assessment & Plan Note (Addendum)
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 Patient tolerated chemotherapy fairly well. CEA improving. CT Sep 10th- improved metastatic lesions to the liver; slight increase in retroperitoneal lymph nodes from 8-9 mm in size. Otherwise no lesions noted in the chest  # Proceed with FOLFOX plus Avastin chemo today; Labs today reviewed;  acceptable for treatment today. Total white count 3.4 and ANC is 2.1    # Nasuea- improvde with Ativan.  #Chemotherapy-induced neutropenia- plan onpro.   # Renal lesion- likely angiomyolipoma; stable monitor for now.   # follow up in 2 weeks/labs/CEA; UA.  # I reviewed the blood work- with the patient in detail; also reviewed the imaging independently [as summarized above]; and with the patient in detail.

## 2017-03-01 LAB — CEA: CEA: 422.2 ng/mL — ABNORMAL HIGH (ref 0.0–4.7)

## 2017-03-02 ENCOUNTER — Inpatient Hospital Stay: Payer: Medicare HMO

## 2017-03-02 VITALS — BP 148/90 | HR 89 | Temp 98.5°F | Resp 20

## 2017-03-02 DIAGNOSIS — D701 Agranulocytosis secondary to cancer chemotherapy: Secondary | ICD-10-CM | POA: Diagnosis not present

## 2017-03-02 DIAGNOSIS — Z5111 Encounter for antineoplastic chemotherapy: Secondary | ICD-10-CM | POA: Diagnosis not present

## 2017-03-02 DIAGNOSIS — R5383 Other fatigue: Secondary | ICD-10-CM | POA: Diagnosis not present

## 2017-03-02 DIAGNOSIS — N289 Disorder of kidney and ureter, unspecified: Secondary | ICD-10-CM | POA: Diagnosis not present

## 2017-03-02 DIAGNOSIS — C799 Secondary malignant neoplasm of unspecified site: Secondary | ICD-10-CM

## 2017-03-02 DIAGNOSIS — C7A1 Malignant poorly differentiated neuroendocrine tumors: Secondary | ICD-10-CM | POA: Diagnosis not present

## 2017-03-02 DIAGNOSIS — Z7689 Persons encountering health services in other specified circumstances: Secondary | ICD-10-CM | POA: Diagnosis not present

## 2017-03-02 DIAGNOSIS — Z5112 Encounter for antineoplastic immunotherapy: Secondary | ICD-10-CM | POA: Diagnosis not present

## 2017-03-02 DIAGNOSIS — C7B8 Other secondary neuroendocrine tumors: Secondary | ICD-10-CM | POA: Diagnosis not present

## 2017-03-02 DIAGNOSIS — C182 Malignant neoplasm of ascending colon: Secondary | ICD-10-CM

## 2017-03-02 DIAGNOSIS — R599 Enlarged lymph nodes, unspecified: Secondary | ICD-10-CM | POA: Diagnosis not present

## 2017-03-02 DIAGNOSIS — D696 Thrombocytopenia, unspecified: Secondary | ICD-10-CM | POA: Diagnosis not present

## 2017-03-02 DIAGNOSIS — C189 Malignant neoplasm of colon, unspecified: Secondary | ICD-10-CM

## 2017-03-02 MED ORDER — PEGFILGRASTIM 6 MG/0.6ML ~~LOC~~ PSKT
6.0000 mg | PREFILLED_SYRINGE | Freq: Once | SUBCUTANEOUS | Status: AC
  Administered 2017-03-02: 6 mg via SUBCUTANEOUS

## 2017-03-02 MED ORDER — HEPARIN SOD (PORK) LOCK FLUSH 100 UNIT/ML IV SOLN
500.0000 [IU] | Freq: Once | INTRAVENOUS | Status: AC | PRN
  Administered 2017-03-02: 500 [IU]
  Filled 2017-03-02: qty 5

## 2017-03-02 MED ORDER — SODIUM CHLORIDE 0.9% FLUSH
10.0000 mL | INTRAVENOUS | Status: DC | PRN
  Administered 2017-03-02: 10 mL
  Filled 2017-03-02: qty 10

## 2017-03-14 ENCOUNTER — Inpatient Hospital Stay: Payer: Medicare HMO

## 2017-03-14 ENCOUNTER — Inpatient Hospital Stay (HOSPITAL_BASED_OUTPATIENT_CLINIC_OR_DEPARTMENT_OTHER): Payer: Medicare HMO | Admitting: Internal Medicine

## 2017-03-14 VITALS — BP 139/97 | HR 82 | Temp 98.6°F | Resp 16 | Wt 203.0 lb

## 2017-03-14 DIAGNOSIS — I700 Atherosclerosis of aorta: Secondary | ICD-10-CM | POA: Diagnosis not present

## 2017-03-14 DIAGNOSIS — R197 Diarrhea, unspecified: Secondary | ICD-10-CM

## 2017-03-14 DIAGNOSIS — C7A1 Malignant poorly differentiated neuroendocrine tumors: Secondary | ICD-10-CM | POA: Diagnosis not present

## 2017-03-14 DIAGNOSIS — Z5112 Encounter for antineoplastic immunotherapy: Secondary | ICD-10-CM | POA: Diagnosis not present

## 2017-03-14 DIAGNOSIS — R599 Enlarged lymph nodes, unspecified: Secondary | ICD-10-CM

## 2017-03-14 DIAGNOSIS — Z7689 Persons encountering health services in other specified circumstances: Secondary | ICD-10-CM | POA: Diagnosis not present

## 2017-03-14 DIAGNOSIS — R5383 Other fatigue: Secondary | ICD-10-CM | POA: Diagnosis not present

## 2017-03-14 DIAGNOSIS — C7B8 Other secondary neuroendocrine tumors: Secondary | ICD-10-CM

## 2017-03-14 DIAGNOSIS — F419 Anxiety disorder, unspecified: Secondary | ICD-10-CM | POA: Diagnosis not present

## 2017-03-14 DIAGNOSIS — E669 Obesity, unspecified: Secondary | ICD-10-CM | POA: Diagnosis not present

## 2017-03-14 DIAGNOSIS — N289 Disorder of kidney and ureter, unspecified: Secondary | ICD-10-CM | POA: Diagnosis not present

## 2017-03-14 DIAGNOSIS — M129 Arthropathy, unspecified: Secondary | ICD-10-CM

## 2017-03-14 DIAGNOSIS — D696 Thrombocytopenia, unspecified: Secondary | ICD-10-CM | POA: Diagnosis not present

## 2017-03-14 DIAGNOSIS — C182 Malignant neoplasm of ascending colon: Secondary | ICD-10-CM

## 2017-03-14 DIAGNOSIS — Z79899 Other long term (current) drug therapy: Secondary | ICD-10-CM

## 2017-03-14 DIAGNOSIS — R69 Illness, unspecified: Secondary | ICD-10-CM | POA: Diagnosis not present

## 2017-03-14 DIAGNOSIS — D1800 Hemangioma unspecified site: Secondary | ICD-10-CM

## 2017-03-14 DIAGNOSIS — C799 Secondary malignant neoplasm of unspecified site: Secondary | ICD-10-CM

## 2017-03-14 DIAGNOSIS — C189 Malignant neoplasm of colon, unspecified: Secondary | ICD-10-CM

## 2017-03-14 DIAGNOSIS — D1771 Benign lipomatous neoplasm of kidney: Secondary | ICD-10-CM

## 2017-03-14 DIAGNOSIS — Z803 Family history of malignant neoplasm of breast: Secondary | ICD-10-CM

## 2017-03-14 DIAGNOSIS — Z5111 Encounter for antineoplastic chemotherapy: Secondary | ICD-10-CM | POA: Diagnosis not present

## 2017-03-14 DIAGNOSIS — D701 Agranulocytosis secondary to cancer chemotherapy: Secondary | ICD-10-CM | POA: Diagnosis not present

## 2017-03-14 LAB — URINALYSIS, COMPLETE (UACMP) WITH MICROSCOPIC
BILIRUBIN URINE: NEGATIVE
Bacteria, UA: NONE SEEN
GLUCOSE, UA: NEGATIVE mg/dL
KETONES UR: NEGATIVE mg/dL
LEUKOCYTES UA: NEGATIVE
NITRITE: NEGATIVE
PH: 5 (ref 5.0–8.0)
Protein, ur: NEGATIVE mg/dL
SPECIFIC GRAVITY, URINE: 1.024 (ref 1.005–1.030)

## 2017-03-14 LAB — CBC WITH DIFFERENTIAL/PLATELET
BASOS ABS: 0 10*3/uL (ref 0–0.1)
BASOS PCT: 1 %
EOS PCT: 2 %
Eosinophils Absolute: 0.1 10*3/uL (ref 0–0.7)
HCT: 34.6 % — ABNORMAL LOW (ref 35.0–47.0)
Hemoglobin: 12 g/dL (ref 12.0–16.0)
LYMPHS PCT: 12 %
Lymphs Abs: 0.8 10*3/uL — ABNORMAL LOW (ref 1.0–3.6)
MCH: 35.9 pg — ABNORMAL HIGH (ref 26.0–34.0)
MCHC: 34.7 g/dL (ref 32.0–36.0)
MCV: 103.5 fL — AB (ref 80.0–100.0)
Monocytes Absolute: 0.4 10*3/uL (ref 0.2–0.9)
Monocytes Relative: 7 %
Neutro Abs: 5.4 10*3/uL (ref 1.4–6.5)
Neutrophils Relative %: 78 %
PLATELETS: 109 10*3/uL — AB (ref 150–440)
RBC: 3.34 MIL/uL — AB (ref 3.80–5.20)
RDW: 19 % — ABNORMAL HIGH (ref 11.5–14.5)
WBC: 6.9 10*3/uL (ref 3.6–11.0)

## 2017-03-14 LAB — COMPREHENSIVE METABOLIC PANEL
ALT: 32 U/L (ref 14–54)
AST: 47 U/L — ABNORMAL HIGH (ref 15–41)
Albumin: 3.7 g/dL (ref 3.5–5.0)
Alkaline Phosphatase: 176 U/L — ABNORMAL HIGH (ref 38–126)
Anion gap: 5 (ref 5–15)
BILIRUBIN TOTAL: 0.5 mg/dL (ref 0.3–1.2)
BUN: 15 mg/dL (ref 6–20)
CO2: 23 mmol/L (ref 22–32)
CREATININE: 0.89 mg/dL (ref 0.44–1.00)
Calcium: 8.8 mg/dL — ABNORMAL LOW (ref 8.9–10.3)
Chloride: 108 mmol/L (ref 101–111)
Glucose, Bld: 99 mg/dL (ref 65–99)
Potassium: 4 mmol/L (ref 3.5–5.1)
Sodium: 136 mmol/L (ref 135–145)
TOTAL PROTEIN: 7.6 g/dL (ref 6.5–8.1)

## 2017-03-14 MED ORDER — SODIUM CHLORIDE 0.9 % IV SOLN
Freq: Once | INTRAVENOUS | Status: AC
  Administered 2017-03-14: 12:00:00 via INTRAVENOUS
  Filled 2017-03-14: qty 100

## 2017-03-14 MED ORDER — OXALIPLATIN CHEMO INJECTION 100 MG/20ML
85.0000 mg/m2 | Freq: Once | INTRAVENOUS | Status: AC
  Administered 2017-03-14: 170 mg via INTRAVENOUS
  Filled 2017-03-14: qty 20

## 2017-03-14 MED ORDER — HEPARIN SOD (PORK) LOCK FLUSH 100 UNIT/ML IV SOLN: 500.0000 [IU] | Freq: Once | INTRAVENOUS | Status: DC | PRN

## 2017-03-14 MED ORDER — LEUCOVORIN CALCIUM INJECTION 350 MG
800.0000 mg | Freq: Once | INTRAVENOUS | Status: AC
  Administered 2017-03-14: 800 mg via INTRAVENOUS
  Filled 2017-03-14: qty 35

## 2017-03-14 MED ORDER — SODIUM CHLORIDE 0.9 % IV SOLN
Freq: Once | INTRAVENOUS | Status: AC
  Administered 2017-03-14: 14:00:00 via INTRAVENOUS
  Filled 2017-03-14: qty 5

## 2017-03-14 MED ORDER — SODIUM CHLORIDE 0.9 % IV SOLN
5.0000 mg/kg | Freq: Once | INTRAVENOUS | Status: AC
  Administered 2017-03-14: 450 mg via INTRAVENOUS
  Filled 2017-03-14: qty 16

## 2017-03-14 MED ORDER — DEXTROSE 5 % IV SOLN
Freq: Once | INTRAVENOUS | Status: AC
  Administered 2017-03-14: 15:00:00 via INTRAVENOUS
  Filled 2017-03-14: qty 1000

## 2017-03-14 MED ORDER — SODIUM CHLORIDE 0.9 % IV SOLN
INTRAVENOUS | Status: DC
  Administered 2017-03-14: 12:00:00 via INTRAVENOUS
  Filled 2017-03-14: qty 1000

## 2017-03-14 MED ORDER — PALONOSETRON HCL INJECTION 0.25 MG/5ML
0.2500 mg | Freq: Once | INTRAVENOUS | Status: AC
  Administered 2017-03-14: 0.25 mg via INTRAVENOUS
  Filled 2017-03-14: qty 5

## 2017-03-14 MED ORDER — SODIUM CHLORIDE 0.9 % IV SOLN
2400.0000 mg/m2 | INTRAVENOUS | Status: DC
  Administered 2017-03-14: 4850 mg via INTRAVENOUS
  Filled 2017-03-14: qty 97

## 2017-03-14 MED ORDER — SODIUM CHLORIDE 0.9% FLUSH
10.0000 mL | INTRAVENOUS | Status: DC | PRN
  Filled 2017-03-14: qty 10

## 2017-03-14 MED ORDER — LORAZEPAM 2 MG/ML IJ SOLN
1.0000 mg | Freq: Once | INTRAMUSCULAR | Status: AC
  Administered 2017-03-14: 1 mg via INTRAVENOUS
  Filled 2017-03-14: qty 1

## 2017-03-14 NOTE — Assessment & Plan Note (Addendum)
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  # On palliative FOLFOX. Patient tolerated chemotherapy fairly well. CT Sep 10th- improved metastatic lesions to the liver; slight increase in retroperitoneal lymph nodes from 8-9 mm in size. Otherwise no lesions noted in the chest. CEA slightly rising.   # Proceed with FOLFOX plus Avastin chemo today; Labs today reviewed;  acceptable for treatment today. Total white count 6.9 and ANC is 5.4.   # Discussed with patient and daughter regarding the slightly rising CEA; awaiting CEA from today. If this continues to rise would consider changing treatments- IRI+VEC+Vemu- is a consideration.  # Continue Neulasta/onpro- to prevent chemotherapy-induced neutropenia.   # Nasuea- improved with Ativan.  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   # Elevated Alkaline phos- 176; monitor for now. No evidence of bone metastases on the recent imaging.  # Thrombocytopenia- G-1 109- sec to ox- monitor for now.   # Chemotherapy-induced neutropenia- plan onpro.   # Renal lesion- likely angiomyolipoma; stable monitor for now.   # follow up in 2 weeks/labs/CEA; UA.

## 2017-03-14 NOTE — Progress Notes (Signed)
[place] NOTE  [name], [name], MD (Internal Medicine) Bary Castilla, Forest Gleason, [name], RN as Registered Nurse [name], MD as Consulting Physician (Internal Medicine)  CHIEF COMPLAINTS/PURPOSE OF CONSULTATION:   Oncology History   # MARCH/APRIL 2017-NEUROENDOCRINE CA STAGE IV; [name]; [name]; [name]; [name]; [name]; [name]; [name]; July 14th  2017 CT- PR  # AUG 28th- FOLFIRI +Avastin; OCT 5th CT scan- PR.   # MARCH 20th- CT STABLE liver lesions; ? Enlarging cecal lesion [CEA rising]; March 26th- FOLFOX [last avastin march 12th 2018];   # resection of primary tumor [Dr.Byrnett]; y90- June 19th 2018.  # July 11th-FOLFOX   # [facility]- B-RAF V600E- MUTATED [May 5400]  # right kidney lesion 2.6 x 1.6 cm ? Angiolipoma [followed by Urology in past]     Malignant neoplasm of ascending colon ([place])     HISTORY OF PRESENTING ILLNESS:  Kathleen James 66 y.o.  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   Patient had trauma to her left eye- went a chair hit her face. No bleeding. Only a bruise.  Patient complains of mild-to-moderate tingling and numbness in the hands and feet. This is not interrupting her daily lifestyle. Denies any shortness of breath. Denies any cough. Denies any headaches. She is concerned about the rising CEA.  ROS: A complete 10 point review of system is done which is negative except mentioned above in history of present illness  MEDICAL HISTORY:   Past Medical History:  Diagnosis Date  . Angiolipoma of kidney    followed with serial CTs ,,  Dr. Jacqlyn Larsen  . Anxiety   . Arthritis   . 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 Chemotherapy induced diarrhea   . Hammer toe   . Hypertension   . Liver cancer ([place])   . Neuro-endocrine carcinoma ([place])   . Obesity (BMI 30-39.9)     SURGICAL HISTORY: Past Surgical History:  Procedure Laterality Date  . ABDOMINAL HYSTERECTOMY  1995   endometriosis, heavy bleeding  . BUNIONECTOMY Bilateral   . CESAREAN SECTION  1985  . COLONOSCOPY WITH PROPOFOL N/A 10/09/2015   Procedure: COLONOSCOPY WITH PROPOFOL;  Surgeon: Robert Bellow, MD;  Location[facility] ENDOSCOPY;  Service: Endoscopy;  Laterality: N/A;  . IR ANGIOGRAM SELECTIVE EACH ADDITIONAL VESSEL  11/22/2016  . IR ANGIOGRAM SELECTIVE EACH ADDITIONAL VESSEL  11/22/2016  . IR ANGIOGRAM SELECTIVE EACH ADDITIONAL VESSEL  11/22/2016  . IR ANGIOGRAM SELECTIVE EACH ADDITIONAL VESSEL  12/07/2016  . IR ANGIOGRAM SELECTIVE EACH ADDITIONAL VESSEL  12/07/2016  . IR ANGIOGRAM VISCERAL SELECTIVE  11/22/2016  . IR ANGIOGRAM VISCERAL SELECTIVE  12/07/2016  . IR EMBO ARTERIAL NOT HEMORR HEMANG INC GUIDE ROADMAPPING  11/22/2016  . IR EMBO TUMOR ORGAN ISCHEMIA INFARCT INC GUIDE ROADMAPPING  12/07/2016  . IR GENERIC HISTORICAL  04/07/2016   IR RADIOLOGIST EVAL & MGMT 04/07/2016 Arne Cleveland, MD [facility] INTERV RAD  . IR US GUIDE VASC ACCESS RIGHT  11/22/2016  . IR US  GUIDE VASC ACCESS RIGHT  12/07/2016  . LAPAROSCOPIC RIGHT COLECTOMY Right 10/26/2016   Procedure: LAPAROSCOPIC RIGHT COLECTOMY;  Surgeon: Robert Bellow, MD;  Location: [facility] ORS;  Service: General;  Laterality: Right;  . OOPHORECTOMY    . PORTACATH PLACEMENT Left 10/01/2015   Procedure: INSERTION PORT-A-CATH;  Surgeon: Robert Bellow, MD;  Location: [facility] ORS;  Service: General;  Laterality: Left;  . ROTATOR CUFF REPAIR Right 2008   right shoulder.  [place]      SOCIAL HISTORY: She lives at home with her family in [place]. 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 Social History   Social History  . Marital status: Married    Spouse name: N/A  . Number of children: N/A  . Years of education: N/A   Occupational History  . Not on file.   Social History Main Topics  . Smoking status: Never Smoker  . Smokeless tobacco: Never Used  . Alcohol use 0.0 oz/week     Comment: occasionally  . Drug use: No  . Sexual activity: Not Currently   Other Topics Concern  . Not on file   Social History Narrative  . No narrative on file    FAMILY HISTORY: Family History  Problem Relation Age of Onset  . Mental illness Mother 15       bipolar, dementia  . Cancer Maternal Aunt 70       breast ca  . Hypertension Father   . Stroke Father 34       deceased  . COPD Father   . Heart disease Sister        deceased    ALLERGIES:  is allergic to bee venom; dilaudid [hydromorphone hcl]; and morphine and related.  MEDICATIONS:  Current Outpatient Prescriptions  Medication Sig Dispense Refill  . ALPRAZolam (XANAX) 0.25 MG tablet TAKE 1 TABLET TWICE DAILY AS NEEDED FOR ANXIETY OR SLEEP 60 tablet 1  . amLODipine (NORVASC) 5 MG tablet TAKE ONE TABLET BY MOUTH EVERY DAY 30 tablet 2  . Biotin w/ Vitamins C & E (HAIR/SKIN/NAILS PO) Take 1 tablet by mouth daily.    . Chlorpheniramine Maleate (CHLOR-TABLETS PO) Take 1 tablet by mouth every evening.    . diphenoxylate-atropine (LOMOTIL) 2.5-0.025 MG tablet TAKE 1 TABLET FOUR TIMES DAILY AS NEEDEDFOR DIARRHEA OR LOOSE STOOLS 30 tablet 3  . lidocaine-prilocaine (EMLA) cream Apply 1 application topically as needed. Apply to port a cath site 1 hour prior to chemotherapy treatments 30 g 6  . loperamide (IMODIUM A-D) 2 MG tablet Take 2 mg by mouth 4 (four) times daily as needed for diarrhea or loose stools.    . naproxen sodium (ANAPROX) 220 MG tablet Take 440 mg by mouth 2  (two) times daily as needed.    Marland Kitchen olmesartan (BENICAR) 40 MG tablet TAKE ONE TABLET BY MOUTH EVERY DAY 30 tablet 2  . ondansetron (ZOFRAN) 8 MG tablet Take 1 tablet (8 mg total) by mouth every 8 (eight) hours as needed for nausea or vomiting. 40 tablet 3  . potassium chloride (K-DUR) 10 MEQ tablet TAKE ONE TABLET BY MOUTH TWICE DAILY 60 tablet 4  . prochlorperazine (COMPAZINE) 10 MG tablet TAKE ONE TABLET BY MOUTH EVERY 6 HOURS AS NEEDED FOR NAUSEA OR VOMITING 30 tablet 2   No current facility-administered medications for this visit.    Facility-Administered Medications Ordered in Other Visits  Medication Dose Route Frequency Provider Last Rate Last Dose  .  0.9 %  sodium chloride infusion   Intravenous Continuous [name], MD 10 mL/hr at 03/14/17 1150    . bevacizumab (AVASTIN) 450 mg in sodium chloride 0.9 % 100 mL chemo infusion  5 mg/kg (Treatment Plan Recorded) Intravenous Once Charlaine Dalton R, MD      . dextrose 5 % solution   Intravenous Once Charlaine Dalton R, MD      . fluorouracil (ADRUCIL) 4,850 mg in sodium chloride 0.9 % 53 mL chemo infusion  2,400 mg/m2 (Treatment Plan Recorded) Intravenous 1 day or 1 dose Charlaine Dalton R, MD      . fosaprepitant (EMEND) 150 mg, dexamethasone (DECADRON) 12 mg in sodium chloride 0.9 % 145 mL IVPB   Intravenous Once Charlaine Dalton R, MD      . heparin lock flush 100 unit/mL  500 Units Intravenous Once Charlaine Dalton R, MD      . heparin lock flush 100 unit/mL  500 Units Intracatheter Once PRN Charlaine Dalton R, MD      . leucovorin 800 mg in dextrose 5 % 250 mL infusion  800 mg Intravenous Once Charlaine Dalton R, MD      . oxaliplatin (ELOXATIN) 170 mg in dextrose 5 % 500 mL chemo infusion  85 mg/m2 (Treatment Plan Recorded) Intravenous Once Charlaine Dalton R, MD      . sodium chloride flush (NS) 0.9 % injection 10 mL  10 mL Intravenous PRN [name], MD   10 mL at 11/24/15 0836  . sodium  chloride flush (NS) 0.9 % injection 10 mL  10 mL Intracatheter PRN [name], MD          .  PHYSICAL EXAMINATION: ECOG PERFORMANCE STATUS: 0 - Asymptomatic  Vitals:   03/14/17 1039  BP: (!) 139/97  Pulse: 82  Resp: 16  Temp: 98.6 F (37 C)   Filed Weights   03/14/17 1039  Weight: 203 lb (92.1 kg)    GENERAL: Well-nourished well-developed; Alert, no distress and comfortable. Accompanied by her daughter.  EYES: no pallor or icterus OROPHARYNX: no thrush or ulceration; good dentition  NECK: supple, no masses felt LYMPH:  no palpable lymphadenopathy in the cervical, axillary or inguinal regions LUNGS: clear to auscultation and  No wheeze or crackles HEART/CVS: regular rate & rhythm and no murmurs; No lower extremity edema ABDOMEN: abdomen soft, non-tender and normal bowel sounds; positive for hepatomegaly. Musculoskeletal:no cyanosis of digits and no clubbing  PSYCH: alert & oriented x 3 with fluent speech NEURO: no focal motor/sensory deficits SKIN:  no rashes or significant lesions; Mediport is in place.   LABORATORY DATA:  I have reviewed the data as listed Lab Results  Component Value Date   WBC 6.9 03/14/2017   HGB 12.0 03/14/2017   HCT 34.6 (L) 03/14/2017   MCV 103.5 (H) 03/14/2017   PLT 109 (L) 03/14/2017    Recent Labs  02/09/17 0902 02/28/17 0904 03/14/17 1019  NA 136 133* 136  K 3.8 4.3 4.0  CL 106 102 108  CO2 21* 24 23  GLUCOSE 105* 86 99  BUN 12 12 15   CREATININE 0.97 1.00 0.89  CALCIUM 9.2 9.2 8.8*  GFRNONAA 60* 57* >60  GFRAA >60 >60 >60  PROT 7.8 7.8 7.6  ALBUMIN 3.8 3.8 3.7  AST 47* 51* 47*  ALT 35 38 32  ALKPHOS 129* 141* 176*  BILITOT 0.6 0.5 0.5   Results for DORENDA, PFANNENSTIEL (MRN [id number]) as of 02/09/2017 23:23  Ref. Range  09/23/2015 15:04 11/03/2015 08:34 12/10/2015 08:51 12/24/2015 08:50 01/07/2016 09:07 02/02/2016 09:03 03/01/2016 11:00 03/29/2016 09:06 04/26/2016 10:31 05/24/2016 09:50 06/28/2016 08:32 08/09/2016 09:18  08/30/2016 10:13 09/13/2016 09:35 09/27/2016 09:35 10/11/2016 08:38 10/25/2016 08:19 11/04/2016 10:09 11/17/2016 08:29 12/29/2016 11:32 01/12/2017 09:29 01/26/2017 09:31  CEA Latest Ref Range: 0.0 - 4.7 ng/mL 538.7 (H) 860.5 (H) 1,167.0 (H) 658.6 (H) 415.6 (H) 242.1 (H) 173.6 (H) 118.8 (H) 100.6 (H) 91.5 (H) 94.5 (H) 141.5 (H) 181.2 (H) 197.3 (H) 215.3 (H) 254.9 (H) 300.7 (H) 254.9 (H) 268.6 (H)     CEA Latest Ref Range: 0.0 - 4.7 ng/mL                    638.7 (H) 521.8 (H) 398.7 (H)   RADIOGRAPHIC STUDIES: I have personally reviewed the radiological images as listed and agreed with the findings in the report. Ct Chest W Contrast  Result Date: 02/28/2017 CLINICAL DATA:  Restaging colon cancer status post partial right colectomy 10/26/2016. Y-90 therapy 12/07/2016. EXAM: CT CHEST, ABDOMEN, AND PELVIS WITH CONTRAST TECHNIQUE: Multidetector CT imaging of the chest, abdomen and pelvis was performed following the standard protocol during bolus administration of intravenous contrast. CONTRAST:  137mL ISOVUE-300 IOPAMIDOL (ISOVUE-300) INJECTION 61% COMPARISON:  PET-CT 10/06/2015.  Abdominopelvic CT 11/19/2016. FINDINGS: CT CHEST FINDINGS Cardiovascular: No significant vascular findings. Left subclavian Port-A-Cath extends to the lower SVC level. There are calcifications of the mitral annulus. The heart size is normal. There is no pericardial effusion. Mediastinum/Nodes: There are no enlarged mediastinal, hilar or axillary lymph nodes. The thyroid gland, trachea and esophagus demonstrate no significant findings. Lungs/Pleura: There is no pleural effusion. The lungs are clear. Musculoskeletal/Chest wall: No chest wall mass or suspicious osseous findings. CT ABDOMEN AND PELVIS FINDINGS Hepatobiliary: The dominant metastasis in the dome of the right hepatic lobe has decreased in size and attenuation, consistent with partial treatment. This measures 4.3 x 3.0 cm on image 49 of series 2 (previously 6.0 x 3.6 cm). Other suspected  metastases on the prior studies have also decreased in size. No new or enlarging lesions are seen. There are several hepatic cysts as well as a 14 mm hemangioma inferiorly in the right lobe (image 71). No evidence of gallstones, gallbladder wall thickening or biliary dilatation. Pancreas: Unremarkable. No pancreatic ductal dilatation or surrounding inflammatory changes. Spleen: Normal in size without focal abnormality. Adrenals/Urinary Tract: Both adrenal glands appear normal. There is a grossly stable angiomyolipoma involving the posterior aspect of the mid right kidney, measuring 2.5 x 1.5 cm on image 66. No new or enlarging renal lesions are identified. There is no evidence of urinary tract calculus or hydronephrosis. The bladder appears unremarkable. Stomach/Bowel: No evidence of bowel wall thickening, distention or surrounding inflammatory change. Stable postsurgical changes from right hemicolectomy and anastomosis. Vascular/Lymphatic: Further slight enlargement in an aortocaval node measuring 9 mm on image 66 (previously 8 mm). Small mesenteric lymph nodes are stable. No acute vascular findings are seen. There is mild aortic atherosclerosis. Previously embolized gastroduodenal artery noted. Reproductive: Hysterectomy.  No evidence of adnexal mass. Other: Stable postsurgical changes in the anterior abdominal wall. No ascites or peritoneal nodularity. Musculoskeletal: No acute or significant osseous findings. Stable lumbar spine degenerative changes and small sclerotic lesion in the L1 vertebral body. IMPRESSION: 1. No significant findings in the chest. 2. Partially treated multifocal hepatic metastatic disease. In addition, there are underlying cysts and a hemangioma. 3. Further slight enlargement of a borderline aortocaval node. No other signs of progressive metastatic disease. 4.  Stable right renal angiomyolipoma. Electronically Signed   By: Richardean Sale M.D.   On: 02/28/2017 09:52   Ct Abdomen Pelvis W  Contrast  Result Date: 02/28/2017 CLINICAL DATA:  Restaging colon cancer status post partial right colectomy 10/26/2016. Y-90 therapy 12/07/2016. EXAM: CT CHEST, ABDOMEN, AND PELVIS WITH CONTRAST TECHNIQUE: Multidetector CT imaging of the chest, abdomen and pelvis was performed following the standard protocol during bolus administration of intravenous contrast. CONTRAST:  129mL ISOVUE-300 IOPAMIDOL (ISOVUE-300) INJECTION 61% COMPARISON:  PET-CT 10/06/2015.  Abdominopelvic CT 11/19/2016. FINDINGS: CT CHEST FINDINGS Cardiovascular: No significant vascular findings. Left subclavian Port-A-Cath extends to the lower SVC level. There are calcifications of the mitral annulus. The heart size is normal. There is no pericardial effusion. Mediastinum/Nodes: There are no enlarged mediastinal, hilar or axillary lymph nodes. The thyroid gland, trachea and esophagus demonstrate no significant findings. Lungs/Pleura: There is no pleural effusion. The lungs are clear. Musculoskeletal/Chest wall: No chest wall mass or suspicious osseous findings. CT ABDOMEN AND PELVIS FINDINGS Hepatobiliary: The dominant metastasis in the dome of the right hepatic lobe has decreased in size and attenuation, consistent with partial treatment. This measures 4.3 x 3.0 cm on image 49 of series 2 (previously 6.0 x 3.6 cm). Other suspected metastases on the prior studies have also decreased in size. No new or enlarging lesions are seen. There are several hepatic cysts as well as a 14 mm hemangioma inferiorly in the right lobe (image 71). No evidence of gallstones, gallbladder wall thickening or biliary dilatation. Pancreas: Unremarkable. No pancreatic ductal dilatation or surrounding inflammatory changes. Spleen: Normal in size without focal abnormality. Adrenals/Urinary Tract: Both adrenal glands appear normal. There is a grossly stable angiomyolipoma involving the posterior aspect of the mid right kidney, measuring 2.5 x 1.5 cm on image 66. No new or  enlarging renal lesions are identified. There is no evidence of urinary tract calculus or hydronephrosis. The bladder appears unremarkable. Stomach/Bowel: No evidence of bowel wall thickening, distention or surrounding inflammatory change. Stable postsurgical changes from right hemicolectomy and anastomosis. Vascular/Lymphatic: Further slight enlargement in an aortocaval node measuring 9 mm on image 66 (previously 8 mm). Small mesenteric lymph nodes are stable. No acute vascular findings are seen. There is mild aortic atherosclerosis. Previously embolized gastroduodenal artery noted. Reproductive: Hysterectomy.  No evidence of adnexal mass. Other: Stable postsurgical changes in the anterior abdominal wall. No ascites or peritoneal nodularity. Musculoskeletal: No acute or significant osseous findings. Stable lumbar spine degenerative changes and small sclerotic lesion in the L1 vertebral body. IMPRESSION: 1. No significant findings in the chest. 2. Partially treated multifocal hepatic metastatic disease. In addition, there are underlying cysts and a hemangioma. 3. Further slight enlargement of a borderline aortocaval node. No other signs of progressive metastatic disease. 4. Stable right renal angiomyolipoma. Electronically Signed   By: Richardean Sale M.D.   On: 02/28/2017 09:52   Results for RYKA, BEIGHLEY (MRN [id number]) as of 03/14/2017 10:49  Ref. 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  #  On palliative FOLFOX. Patient tolerated  chemotherapy fairly well. CT Sep 10th- improved metastatic lesions to the liver; slight increase in retroperitoneal lymph nodes from 8-9 mm in size. Otherwise no lesions noted in the chest. CEA slightly rising.   # Proceed with FOLFOX plus Avastin chemo today; Labs today reviewed;  acceptable for treatment today. Total white count 6.9 and ANC is 5.4.   # Discussed with patient and daughter regarding the slightly rising CEA; awaiting CEA from today. If this continues to rise would consider changing treatments- IRI+VEC+Vemu- is a consideration.  # Continue Neulasta/onpro- to prevent chemotherapy-induced neutropenia.   # Nasuea- improved with Ativan.  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   # Elevated Alkaline phos- 176; monitor for now. No evidence of bone metastases on the recent imaging.  # Thrombocytopenia- G-1 109- sec to ox- monitor for now.   # Chemotherapy-induced neutropenia- plan onpro.   # Renal lesion- likely angiomyolipoma; stable monitor for now.   # follow up in 2 weeks/labs/CEA; UA.    [name], MD 03/14/2017 12:40 PM

## 2017-03-15 LAB — CEA: CEA1: 529.1 ng/mL — AB (ref 0.0–4.7)

## 2017-03-16 ENCOUNTER — Telehealth: Payer: Self-pay | Admitting: Internal Medicine

## 2017-03-16 ENCOUNTER — Inpatient Hospital Stay: Payer: Medicare HMO

## 2017-03-16 VITALS — BP 168/90 | HR 87 | Temp 97.9°F | Resp 20

## 2017-03-16 DIAGNOSIS — Z5111 Encounter for antineoplastic chemotherapy: Secondary | ICD-10-CM | POA: Diagnosis not present

## 2017-03-16 DIAGNOSIS — R599 Enlarged lymph nodes, unspecified: Secondary | ICD-10-CM | POA: Diagnosis not present

## 2017-03-16 DIAGNOSIS — C7B8 Other secondary neuroendocrine tumors: Secondary | ICD-10-CM | POA: Diagnosis not present

## 2017-03-16 DIAGNOSIS — Z5112 Encounter for antineoplastic immunotherapy: Secondary | ICD-10-CM | POA: Diagnosis not present

## 2017-03-16 DIAGNOSIS — N289 Disorder of kidney and ureter, unspecified: Secondary | ICD-10-CM | POA: Diagnosis not present

## 2017-03-16 DIAGNOSIS — C189 Malignant neoplasm of colon, unspecified: Secondary | ICD-10-CM

## 2017-03-16 DIAGNOSIS — D696 Thrombocytopenia, unspecified: Secondary | ICD-10-CM | POA: Diagnosis not present

## 2017-03-16 DIAGNOSIS — C7A1 Malignant poorly differentiated neuroendocrine tumors: Secondary | ICD-10-CM | POA: Diagnosis not present

## 2017-03-16 DIAGNOSIS — C799 Secondary malignant neoplasm of unspecified site: Secondary | ICD-10-CM

## 2017-03-16 DIAGNOSIS — C182 Malignant neoplasm of ascending colon: Secondary | ICD-10-CM

## 2017-03-16 DIAGNOSIS — Z7689 Persons encountering health services in other specified circumstances: Secondary | ICD-10-CM | POA: Diagnosis not present

## 2017-03-16 DIAGNOSIS — D701 Agranulocytosis secondary to cancer chemotherapy: Secondary | ICD-10-CM | POA: Diagnosis not present

## 2017-03-16 DIAGNOSIS — R5383 Other fatigue: Secondary | ICD-10-CM | POA: Diagnosis not present

## 2017-03-16 MED ORDER — SODIUM CHLORIDE 0.9% FLUSH
10.0000 mL | INTRAVENOUS | Status: DC | PRN
  Administered 2017-03-16: 10 mL
  Filled 2017-03-16: qty 10

## 2017-03-16 MED ORDER — HEPARIN SOD (PORK) LOCK FLUSH 100 UNIT/ML IV SOLN
500.0000 [IU] | Freq: Once | INTRAVENOUS | Status: AC | PRN
  Administered 2017-03-16: 500 [IU]
  Filled 2017-03-16: qty 5

## 2017-03-16 MED ORDER — PEGFILGRASTIM 6 MG/0.6ML ~~LOC~~ PSKT
6.0000 mg | PREFILLED_SYRINGE | Freq: Once | SUBCUTANEOUS | Status: AC
  Administered 2017-03-16: 6 mg via SUBCUTANEOUS
  Filled 2017-03-16: qty 0.6

## 2017-03-16 NOTE — Telephone Encounter (Signed)
Patient came to have her d/c pump today. RN spoke with patient. She is aware of the plan of care.

## 2017-03-16 NOTE — Telephone Encounter (Signed)
Kathleen James, please arrange for pt's lab apts 2-3 days prior to next md apt

## 2017-03-16 NOTE — Telephone Encounter (Signed)
Left message to call re: CEA. Rising CEA; likely need to change the chemo. However, would wait for repeat CEA/in 2 weeks.  Please have pt have come/and have her labs done 2-3 days prior; so that I have CEA when she sees me.

## 2017-03-23 ENCOUNTER — Inpatient Hospital Stay: Payer: Medicare HMO | Attending: Internal Medicine

## 2017-03-23 DIAGNOSIS — C787 Secondary malignant neoplasm of liver and intrahepatic bile duct: Secondary | ICD-10-CM | POA: Insufficient documentation

## 2017-03-23 DIAGNOSIS — Z23 Encounter for immunization: Secondary | ICD-10-CM | POA: Diagnosis not present

## 2017-03-23 DIAGNOSIS — Z9221 Personal history of antineoplastic chemotherapy: Secondary | ICD-10-CM | POA: Insufficient documentation

## 2017-03-23 DIAGNOSIS — D6959 Other secondary thrombocytopenia: Secondary | ICD-10-CM | POA: Insufficient documentation

## 2017-03-23 DIAGNOSIS — R110 Nausea: Secondary | ICD-10-CM | POA: Diagnosis not present

## 2017-03-23 DIAGNOSIS — R7989 Other specified abnormal findings of blood chemistry: Secondary | ICD-10-CM | POA: Diagnosis not present

## 2017-03-23 DIAGNOSIS — N289 Disorder of kidney and ureter, unspecified: Secondary | ICD-10-CM | POA: Diagnosis not present

## 2017-03-23 DIAGNOSIS — Z885 Allergy status to narcotic agent status: Secondary | ICD-10-CM | POA: Diagnosis not present

## 2017-03-23 DIAGNOSIS — Z79899 Other long term (current) drug therapy: Secondary | ICD-10-CM | POA: Insufficient documentation

## 2017-03-23 DIAGNOSIS — C7A1 Malignant poorly differentiated neuroendocrine tumors: Secondary | ICD-10-CM | POA: Insufficient documentation

## 2017-03-23 DIAGNOSIS — I10 Essential (primary) hypertension: Secondary | ICD-10-CM | POA: Insufficient documentation

## 2017-03-23 DIAGNOSIS — R970 Elevated carcinoembryonic antigen [CEA]: Secondary | ICD-10-CM | POA: Insufficient documentation

## 2017-03-23 DIAGNOSIS — R197 Diarrhea, unspecified: Secondary | ICD-10-CM | POA: Insufficient documentation

## 2017-03-23 DIAGNOSIS — E669 Obesity, unspecified: Secondary | ICD-10-CM | POA: Insufficient documentation

## 2017-03-23 DIAGNOSIS — R590 Localized enlarged lymph nodes: Secondary | ICD-10-CM | POA: Diagnosis not present

## 2017-03-23 DIAGNOSIS — Z803 Family history of malignant neoplasm of breast: Secondary | ICD-10-CM | POA: Insufficient documentation

## 2017-03-23 DIAGNOSIS — Z5112 Encounter for antineoplastic immunotherapy: Secondary | ICD-10-CM | POA: Insufficient documentation

## 2017-03-23 DIAGNOSIS — C182 Malignant neoplasm of ascending colon: Secondary | ICD-10-CM | POA: Insufficient documentation

## 2017-03-23 DIAGNOSIS — C7B8 Other secondary neuroendocrine tumors: Secondary | ICD-10-CM | POA: Insufficient documentation

## 2017-03-23 DIAGNOSIS — M1990 Unspecified osteoarthritis, unspecified site: Secondary | ICD-10-CM | POA: Diagnosis not present

## 2017-03-23 LAB — CBC WITH DIFFERENTIAL/PLATELET
Basophils Absolute: 0.1 10*3/uL (ref 0–0.1)
Basophils Relative: 1 %
Eosinophils Absolute: 0.1 10*3/uL (ref 0–0.7)
Eosinophils Relative: 3 %
HCT: 33.9 % — ABNORMAL LOW (ref 35.0–47.0)
HEMOGLOBIN: 11.7 g/dL — AB (ref 12.0–16.0)
LYMPHS ABS: 0.6 10*3/uL — AB (ref 1.0–3.6)
LYMPHS PCT: 12 %
MCH: 35.7 pg — AB (ref 26.0–34.0)
MCHC: 34.6 g/dL (ref 32.0–36.0)
MCV: 103 fL — AB (ref 80.0–100.0)
Monocytes Absolute: 0.7 10*3/uL (ref 0.2–0.9)
Monocytes Relative: 14 %
NEUTROS PCT: 70 %
Neutro Abs: 3.6 10*3/uL (ref 1.4–6.5)
Platelets: 95 10*3/uL — ABNORMAL LOW (ref 150–440)
RBC: 3.29 MIL/uL — AB (ref 3.80–5.20)
RDW: 18.7 % — ABNORMAL HIGH (ref 11.5–14.5)
WBC: 5.1 10*3/uL (ref 3.6–11.0)

## 2017-03-23 LAB — COMPREHENSIVE METABOLIC PANEL
ALK PHOS: 178 U/L — AB (ref 38–126)
ALT: 34 U/L (ref 14–54)
AST: 42 U/L — ABNORMAL HIGH (ref 15–41)
Albumin: 3.8 g/dL (ref 3.5–5.0)
Anion gap: 6 (ref 5–15)
BILIRUBIN TOTAL: 0.6 mg/dL (ref 0.3–1.2)
BUN: 15 mg/dL (ref 6–20)
CALCIUM: 9 mg/dL (ref 8.9–10.3)
CO2: 24 mmol/L (ref 22–32)
CREATININE: 0.94 mg/dL (ref 0.44–1.00)
Chloride: 104 mmol/L (ref 101–111)
Glucose, Bld: 114 mg/dL — ABNORMAL HIGH (ref 65–99)
Potassium: 4.1 mmol/L (ref 3.5–5.1)
Sodium: 134 mmol/L — ABNORMAL LOW (ref 135–145)
Total Protein: 7.9 g/dL (ref 6.5–8.1)

## 2017-03-24 LAB — CEA: CEA1: 537.2 ng/mL — AB (ref 0.0–4.7)

## 2017-03-25 ENCOUNTER — Inpatient Hospital Stay: Payer: Medicare HMO

## 2017-03-28 ENCOUNTER — Other Ambulatory Visit: Payer: Self-pay | Admitting: *Deleted

## 2017-03-28 ENCOUNTER — Telehealth: Payer: Self-pay | Admitting: Pharmacist

## 2017-03-28 ENCOUNTER — Inpatient Hospital Stay: Payer: Medicare HMO

## 2017-03-28 ENCOUNTER — Inpatient Hospital Stay (HOSPITAL_BASED_OUTPATIENT_CLINIC_OR_DEPARTMENT_OTHER): Payer: Medicare HMO | Admitting: Internal Medicine

## 2017-03-28 ENCOUNTER — Other Ambulatory Visit: Payer: Self-pay

## 2017-03-28 VITALS — BP 148/95 | HR 91 | Temp 97.6°F | Resp 18 | Ht 65.0 in | Wt 203.0 lb

## 2017-03-28 DIAGNOSIS — Z79899 Other long term (current) drug therapy: Secondary | ICD-10-CM | POA: Diagnosis not present

## 2017-03-28 DIAGNOSIS — C189 Malignant neoplasm of colon, unspecified: Secondary | ICD-10-CM

## 2017-03-28 DIAGNOSIS — D6959 Other secondary thrombocytopenia: Secondary | ICD-10-CM

## 2017-03-28 DIAGNOSIS — R7989 Other specified abnormal findings of blood chemistry: Secondary | ICD-10-CM | POA: Diagnosis not present

## 2017-03-28 DIAGNOSIS — R590 Localized enlarged lymph nodes: Secondary | ICD-10-CM

## 2017-03-28 DIAGNOSIS — Z885 Allergy status to narcotic agent status: Secondary | ICD-10-CM | POA: Diagnosis not present

## 2017-03-28 DIAGNOSIS — R970 Elevated carcinoembryonic antigen [CEA]: Secondary | ICD-10-CM | POA: Diagnosis not present

## 2017-03-28 DIAGNOSIS — C7B8 Other secondary neuroendocrine tumors: Secondary | ICD-10-CM | POA: Diagnosis not present

## 2017-03-28 DIAGNOSIS — I10 Essential (primary) hypertension: Secondary | ICD-10-CM

## 2017-03-28 DIAGNOSIS — C799 Secondary malignant neoplasm of unspecified site: Principal | ICD-10-CM

## 2017-03-28 DIAGNOSIS — Z803 Family history of malignant neoplasm of breast: Secondary | ICD-10-CM

## 2017-03-28 DIAGNOSIS — C182 Malignant neoplasm of ascending colon: Secondary | ICD-10-CM | POA: Diagnosis not present

## 2017-03-28 DIAGNOSIS — C7A1 Malignant poorly differentiated neuroendocrine tumors: Secondary | ICD-10-CM | POA: Diagnosis not present

## 2017-03-28 DIAGNOSIS — C787 Secondary malignant neoplasm of liver and intrahepatic bile duct: Secondary | ICD-10-CM | POA: Diagnosis not present

## 2017-03-28 DIAGNOSIS — N289 Disorder of kidney and ureter, unspecified: Secondary | ICD-10-CM | POA: Diagnosis not present

## 2017-03-28 DIAGNOSIS — R110 Nausea: Secondary | ICD-10-CM

## 2017-03-28 DIAGNOSIS — Z23 Encounter for immunization: Secondary | ICD-10-CM | POA: Diagnosis not present

## 2017-03-28 DIAGNOSIS — Z5112 Encounter for antineoplastic immunotherapy: Secondary | ICD-10-CM | POA: Diagnosis not present

## 2017-03-28 MED ORDER — VEMURAFENIB 240 MG PO TABS
960.0000 mg | ORAL_TABLET | Freq: Two times a day (BID) | ORAL | 0 refills | Status: DC
Start: 2017-03-28 — End: 2017-03-29

## 2017-03-28 MED ORDER — CLINDAMYCIN PHOSPHATE 1 % EX GEL
Freq: Two times a day (BID) | CUTANEOUS | 0 refills | Status: DC
Start: 2017-03-28 — End: 2017-09-06

## 2017-03-28 MED ORDER — MINOCYCLINE HCL 100 MG PO CAPS: 100.0000 mg | ORAL_CAPSULE | Freq: Every day | ORAL | 1 refills | Status: DC

## 2017-03-28 NOTE — Telephone Encounter (Signed)
Oral Oncology Pharmacist Encounter  Received new prescription for Vemurafenib (Zelboraf) for the treatment of metastatic colon cancer in conjunction with Panitumumab and Irinotecan, planned duration until disease progression or unacceptable drug toxicity. The use of Vemurafenib is based on information from the SWOG 1406 trial  CBC/CMP from 03/23/17 assessed, no relevant lab abnormalities. Prescription dose and frequency assessed. Last EKG 10/21/16, QTc 430. Spoke with Dr. Rogue Bussing and he plans to repeat one before she starts therapy.   Current medication list in Epic reviewed, a few DDIs with Vemurafenib identified all related to the risk of QTc prolongation - Prn medication Zofran and loperamide have risk of QTc prolongation. These medications should be avoided unless neccesary and if used regularly the patient should have her QTc monitored regularly.  I spoke with Ms.Fesler and her daughter for overview of new oral chemotherapy medication while they were in clinic.  Counseled patient on administration, dosing, side effects, monitoring, drug-food interactions, safe handling, storage, and disposal. Patient will take 4 tablets (960 mg total) by mouth every 12 (twelve) hours. Take with water.  Side effects include but not limited to: rash, alopecia, nausea, arthralgia, fatigue, photosensitivity, QT prolongation.   -Talked in detail about the risk of QTc prolongation and the use of Zofran or loperamide.  Reviewed with patient importance of keeping a medication schedule and plan for any missed doses.  Ms. Gordan and her daughter voiced understanding and appreciation. All questions answered.  Prescription has been e-scribed to the [facility] for benefits analysis and approval.  Oral Oncology Clinic will continue to follow for insurance authorization, copayment issues, initial counseling and start date.  Provided patient with [facility] phone  number/business cards. Patient knows to call the office with questions or concerns. Oral Chemotherapy Navigation Clinic will continue to follow.  Darl Pikes, PharmD, BCPS Hematology/Oncology Clinical Pharmacist [facility] [phone]  03/29/2017 8:35 AM

## 2017-03-28 NOTE — Progress Notes (Signed)
START ON PATHWAY REGIMEN - Colorectal     A cycle is every 14 days:     Panitumumab      Irinotecan   **Always confirm dose/schedule in your pharmacy ordering system**    Patient Characteristics: Metastatic Colorectal, Third Line, KRAS/NRAS Wild-Type, BRAF Mutation Positive, No Prior Anti-EGFR Therapy Current evidence of distant metastases<= Yes AJCC T Category: T3 AJCC N Category: N2 AJCC M Category: M1b AJCC 8 Stage Grouping: IVB BRAF Mutation Status: Mutation Positive KRAS/NRAS Mutation Status: Wild Type (no mutation) Line of therapy: Third Line Would you be surprised if this patient died  in the next year<= I would be surprised if this patient died in the next year Intent of Therapy: Non-Curative / Palliative Intent, Discussed with Patient

## 2017-03-28 NOTE — Assessment & Plan Note (Addendum)
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 CT Sep 10th- improved metastatic lesions to the liver; slight increase in retroperitoneal lymph nodes from 8-9 mm in size. Otherwise no lesions noted in the chest. CEA-RISING.   # Given the continued rise in the CEA; given the slight increase in size of the retroperitoneal lymph nodes- I would recommend discontinuation of further chemotherapy with FOLFOX plus Avastin. I would plan changing to Vec+Iri+vemurafenib given B-raf mutation.   # I discussed the palliative; but not curative nature of the treatments in detail. Patient and daughter agree. Discussed the individual side effects of above chemotherapy- Discussed the potential side effects including but not limited to-increasing fatigue, nausea vomiting, diarrhea, hair loss, sores in the mouth, increase risk of infection and also neuropathy. Discussed at length regarding skin rash/photosensitivity; and also arthralgias. Also discussed the proactive use of minocycline to avoid skin rash. Prescribed clindamycin gel recommend hydrocortisone cream.  # HOLD FOLFOX plus Avastin chemo today; labs reviewed.    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   # Elevated Alkaline phos- 178; monitor for now. No evidence of bone metastases on the recent imaging. Likely secondary chemotherapy.   # Thrombocytopenia- G-2; 95; from chemotherapy. This should improve.  # Renal lesion- likely angiomyolipoma; stable monitor for now.   # HOLD chemo today; vec+ iri in 1 week; follow up with me in 3 weeks/labs/CEA/vec+iri.Discussed withholding chemotherapy pharmacist.   # 40 minutes face-to-face with the patient discussing the above plan of care; more than 50% of time spent on prognosis/ natural history; counseling and coordination.

## 2017-03-28 NOTE — Progress Notes (Signed)
[place] NOTE  [name], [name], MD (Internal Medicine) Bary Castilla, Forest Gleason, [name], RN as Registered Nurse [name], MD as Consulting Physician (Internal Medicine)  CHIEF COMPLAINTS/PURPOSE OF CONSULTATION:   Oncology History   # MARCH/APRIL 2017-NEUROENDOCRINE CA STAGE IV; [name]; [name]; [name]; [name]; [name]; [name]; [name]; July 14th  2017 CT- PR  # AUG 28th- FOLFIRI +Avastin; OCT 5th CT scan- PR.   # MARCH 20th- CT STABLE liver lesions; ? Enlarging cecal lesion [CEA rising]; March 26th- FOLFOX [last avastin march 12th 2018];   # resection of primary tumor [Dr.Byrnett]; y90- June 19th 2018.  # July 11th-FOLFOX + Avastin; SEP 2018 [CT- stable liver lesions; increasing ~106mm RP LN]; RISING CEA [stopped end of Sep 2018 ]  # OCT 15th 2018- Vemu+ Iri+ Pan    # [facility]- B-RAF V600E- MUTATED [May 2018]  # right kidney lesion 2.6 x 1.6 cm ? Angiolipoma [followed by Urology in past]     Malignant neoplasm of ascending colon ([place])     HISTORY OF PRESENTING ILLNESS:  Kathleen James 66 y.o.  female with colon cancer with metastases to the liver-  Y90 to liver lesions [June 19th]- Currently on systemic therapy with FOLFOX + Avastin.   Patient noted to have worsening- CEA.   Patient's appetite is fair. No weight loss. Intermittent nausea no vomiting. No nosebleeds or headaches. Mild-to-moderate tingling and numbness in hands and feet. This is not interrupting her daily lifestyle. No falls. No headaches.  ROS: A complete 10 point review of system is done which is negative except mentioned  above in history of present illness  MEDICAL HISTORY:  Past Medical History:  Diagnosis Date  . Angiolipoma of kidney    followed with serial CTs ,,  Dr. Jacqlyn Larsen  . Anxiety   . Arthritis   . 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 Chemotherapy induced diarrhea   . Hammer toe   . Hypertension   . Liver cancer ([place])   . Neuro-endocrine carcinoma ([place])   . Obesity (BMI 30-39.9)     SURGICAL HISTORY: Past Surgical History:  Procedure Laterality Date  . ABDOMINAL HYSTERECTOMY  1995   endometriosis, heavy bleeding  . BUNIONECTOMY Bilateral   . CESAREAN SECTION  1985  . COLONOSCOPY WITH PROPOFOL N/A 10/09/2015   Procedure: COLONOSCOPY WITH PROPOFOL;  Surgeon: Robert Bellow, MD;  Location: [facility] ENDOSCOPY;  Service: Endoscopy;  Laterality: N/A;  . IR ANGIOGRAM SELECTIVE EACH ADDITIONAL VESSEL  11/22/2016  . IR ANGIOGRAM SELECTIVE EACH ADDITIONAL VESSEL  11/22/2016  . IR ANGIOGRAM SELECTIVE EACH ADDITIONAL VESSEL  11/22/2016  . IR ANGIOGRAM SELECTIVE EACH ADDITIONAL VESSEL  12/07/2016  . IR ANGIOGRAM SELECTIVE EACH ADDITIONAL VESSEL  12/07/2016  . IR ANGIOGRAM VISCERAL SELECTIVE  11/22/2016  . IR ANGIOGRAM VISCERAL SELECTIVE  12/07/2016  . IR EMBO ARTERIAL NOT HEMORR HEMANG INC GUIDE ROADMAPPING  11/22/2016  . IR EMBO TUMOR ORGAN ISCHEMIA INFARCT INC GUIDE ROADMAPPING  12/07/2016  . IR GENERIC HISTORICAL  04/07/2016   IR RADIOLOGIST EVAL & MGMT 04/07/2016 Arne Cleveland, MD [facility] INTERV RAD  .  IR US GUIDE VASC ACCESS RIGHT  11/22/2016  . IR US GUIDE VASC ACCESS RIGHT  12/07/2016  . LAPAROSCOPIC RIGHT COLECTOMY Right 10/26/2016   Procedure: LAPAROSCOPIC RIGHT COLECTOMY;  Surgeon: Robert Bellow, MD;  Location: [facility] ORS;  Service: General;  Laterality: Right;  . OOPHORECTOMY    . PORTACATH PLACEMENT Left 10/01/2015   Procedure: INSERTION PORT-A-CATH;  Surgeon: Robert Bellow, MD;  Location: [facility] ORS;  Service: General;  Laterality: Left;  .  ROTATOR CUFF REPAIR Right 2008   right shoulder.  [place]     SOCIAL HISTORY: She lives at home with her family in [place]. 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 Social History   Social History  . Marital status: Married    Spouse name: N/A  . Number of children: N/A  . Years of education: N/A   Occupational History  . Not on file.   Social History Main Topics  . Smoking status: Never Smoker  . Smokeless tobacco: Never Used  . Alcohol use 0.0 oz/week     Comment: occasionally  . Drug use: No  . Sexual activity: Not Currently   Other Topics Concern  . Not on file   Social History Narrative  . No narrative on file    FAMILY HISTORY: Family History  Problem Relation Age of Onset  . Mental illness Mother 39       bipolar, dementia  . Cancer Maternal Aunt 70       breast ca  . Hypertension Father   . Stroke Father 52       deceased  . COPD Father   . Heart disease Sister        deceased    ALLERGIES:  is allergic to bee venom; dilaudid [hydromorphone hcl]; and morphine and related.  MEDICATIONS:  Current Outpatient Prescriptions  Medication Sig Dispense Refill  . ALPRAZolam (XANAX) 0.25 MG tablet TAKE 1 TABLET TWICE DAILY AS NEEDED FOR ANXIETY OR SLEEP 60 tablet 1  . amLODipine (NORVASC) 5 MG tablet TAKE ONE TABLET BY MOUTH EVERY DAY 30 tablet 2  . Biotin w/ Vitamins C & E (HAIR/SKIN/NAILS PO) Take 1 tablet by mouth daily.    . Chlorpheniramine Maleate (CHLOR-TABLETS PO) Take 1 tablet by mouth every evening.    . lidocaine-prilocaine (EMLA) cream Apply 1 application topically as needed. Apply to port a cath site 1 hour prior to chemotherapy treatments 30 g 6  . loperamide (IMODIUM A-D) 2 MG tablet Take 2 mg by mouth 4 (four) times daily as needed for diarrhea or loose stools.    . naproxen sodium (ANAPROX) 220 MG tablet Take 440 mg by mouth 2 (two) times daily as needed.    Marland Kitchen olmesartan (BENICAR) 40 MG tablet TAKE ONE  TABLET BY MOUTH EVERY DAY 30 tablet 2  . ondansetron (ZOFRAN) 8 MG tablet Take 1 tablet (8 mg total) by mouth every 8 (eight) hours as needed for nausea or vomiting. 40 tablet 3  . potassium chloride (K-DUR) 10 MEQ tablet TAKE ONE TABLET BY MOUTH TWICE DAILY 60 tablet 4  . clindamycin (CLINDAGEL) 1 % gel Apply topically 2 (two) times daily. Apply to affected areas twice a day. 30 g 0  . diphenoxylate-atropine (LOMOTIL) 2.5-0.025 MG tablet TAKE 1 TABLET FOUR TIMES DAILY AS NEEDEDFOR DIARRHEA OR LOOSE STOOLS (Patient not taking: Reported on 03/28/2017) 30 tablet 3  . minocycline (MINOCIN) 100 MG capsule Take 1 capsule (100 mg  total) by mouth daily. 60 capsule 1  . prochlorperazine (COMPAZINE) 10 MG tablet TAKE ONE TABLET BY MOUTH EVERY 6 HOURS AS NEEDED FOR NAUSEA OR VOMITING (Patient not taking: Reported on 03/28/2017) 30 tablet 2  . vemurafenib (ZELBORAF) 240 MG tablet Take 4 tablets (960 mg total) by mouth every 12 (twelve) hours. Take with water. 240 tablet 0   No current facility-administered medications for this visit.    Facility-Administered Medications Ordered in Other Visits  Medication Dose Route Frequency Provider Last Rate Last Dose  . heparin lock flush 100 unit/mL  500 Units Intravenous Once Charlaine Dalton R, MD      . sodium chloride flush (NS) 0.9 % injection 10 mL  10 mL Intravenous PRN [name], MD   10 mL at 11/24/15 0836      .  PHYSICAL EXAMINATION: ECOG PERFORMANCE STATUS: 0 - Asymptomatic  Vitals:   03/28/17 1040  BP: (!) 148/95  Pulse: 91  Resp: 18  Temp: 97.6 F (36.4 C)   Filed Weights   03/28/17 1040  Weight: 203 lb (92.1 kg)    GENERAL: Well-nourished well-developed; Alert, no distress and comfortable. Accompanied by her daughter.  EYES: no pallor or icterus OROPHARYNX: no thrush or ulceration; good dentition  NECK: supple, no masses felt LYMPH:  no palpable lymphadenopathy in the cervical, axillary or inguinal regions LUNGS: clear  to auscultation and  No wheeze or crackles HEART/CVS: regular rate & rhythm and no murmurs; No lower extremity edema ABDOMEN: abdomen soft, non-tender and normal bowel sounds; positive for hepatomegaly. Musculoskeletal:no cyanosis of digits and no clubbing  PSYCH: alert & oriented x 3 with fluent speech NEURO: no focal motor/sensory deficits SKIN:  no rashes or significant lesions; Mediport is in place.   LABORATORY DATA:  I have reviewed the data as listed Lab Results  Component Value Date   WBC 5.1 03/23/2017   HGB 11.7 (L) 03/23/2017   HCT 33.9 (L) 03/23/2017   MCV 103.0 (H) 03/23/2017   PLT 95 (L) 03/23/2017    Recent Labs  02/28/17 0904 03/14/17 1019 03/23/17 0940  NA 133* 136 134*  K 4.3 4.0 4.1  CL 102 108 104  CO2 24 23 24   GLUCOSE 86 99 114*  BUN 12 15 15   CREATININE 1.00 0.89 0.94  CALCIUM 9.2 8.8* 9.0  GFRNONAA 57* >60 >60  GFRAA >60 >60 >60  PROT 7.8 7.6 7.9  ALBUMIN 3.8 3.7 3.8  AST 51* 47* 42*  ALT 38 32 34  ALKPHOS 141* 176* 178*  BILITOT 0.5 0.5 0.6   Results for Kathleen James, Kathleen James (MRN [id number]) as of 02/09/2017 23:23  Ref. Range 09/23/2015 15:04 11/03/2015 08:34 12/10/2015 08:51 12/24/2015 08:50 01/07/2016 09:07 02/02/2016 09:03 03/01/2016 11:00 03/29/2016 09:06 04/26/2016 10:31 05/24/2016 09:50 06/28/2016 08:32 08/09/2016 09:18 08/30/2016 10:13 09/13/2016 09:35 09/27/2016 09:35 10/11/2016 08:38 10/25/2016 08:19 11/04/2016 10:09 11/17/2016 08:29 12/29/2016 11:32 01/12/2017 09:29 01/26/2017 09:31  CEA Latest Ref Range: 0.0 - 4.7 ng/mL 538.7 (H) 860.5 (H) 1,167.0 (H) 658.6 (H) 415.6 (H) 242.1 (H) 173.6 (H) 118.8 (H) 100.6 (H) 91.5 (H) 94.5 (H) 141.5 (H) 181.2 (H) 197.3 (H) 215.3 (H) 254.9 (H) 300.7 (H) 254.9 (H) 268.6 (H)     CEA Latest Ref Range: 0.0 - 4.7 ng/mL                    638.7 (H) 521.8 (H) 398.7 (H)   RADIOGRAPHIC STUDIES: I have personally reviewed the radiological images as listed and agreed with  the findings in the report. Ct Chest W Contrast  Result Date:  02/28/2017 CLINICAL DATA:  Restaging colon cancer status post partial right colectomy 10/26/2016. Y-90 therapy 12/07/2016. EXAM: CT CHEST, ABDOMEN, AND PELVIS WITH CONTRAST TECHNIQUE: Multidetector CT imaging of the chest, abdomen and pelvis was performed following the standard protocol during bolus administration of intravenous contrast. CONTRAST:  158mL ISOVUE-300 IOPAMIDOL (ISOVUE-300) INJECTION 61% COMPARISON:  PET-CT 10/06/2015.  Abdominopelvic CT 11/19/2016. FINDINGS: CT CHEST FINDINGS Cardiovascular: No significant vascular findings. Left subclavian Port-A-Cath extends to the lower SVC level. There are calcifications of the mitral annulus. The heart size is normal. There is no pericardial effusion. Mediastinum/Nodes: There are no enlarged mediastinal, hilar or axillary lymph nodes. The thyroid gland, trachea and esophagus demonstrate no significant findings. Lungs/Pleura: There is no pleural effusion. The lungs are clear. Musculoskeletal/Chest wall: No chest wall mass or suspicious osseous findings. CT ABDOMEN AND PELVIS FINDINGS Hepatobiliary: The dominant metastasis in the dome of the right hepatic lobe has decreased in size and attenuation, consistent with partial treatment. This measures 4.3 x 3.0 cm on image 49 of series 2 (previously 6.0 x 3.6 cm). Other suspected metastases on the prior studies have also decreased in size. No new or enlarging lesions are seen. There are several hepatic cysts as well as a 14 mm hemangioma inferiorly in the right lobe (image 71). No evidence of gallstones, gallbladder wall thickening or biliary dilatation. Pancreas: Unremarkable. No pancreatic ductal dilatation or surrounding inflammatory changes. Spleen: Normal in size without focal abnormality. Adrenals/Urinary Tract: Both adrenal glands appear normal. There is a grossly stable angiomyolipoma involving the posterior aspect of the mid right kidney, measuring 2.5 x 1.5 cm on image 66. No new or enlarging renal lesions  are identified. There is no evidence of urinary tract calculus or hydronephrosis. The bladder appears unremarkable. Stomach/Bowel: No evidence of bowel wall thickening, distention or surrounding inflammatory change. Stable postsurgical changes from right hemicolectomy and anastomosis. Vascular/Lymphatic: Further slight enlargement in an aortocaval node measuring 9 mm on image 66 (previously 8 mm). Small mesenteric lymph nodes are stable. No acute vascular findings are seen. There is mild aortic atherosclerosis. Previously embolized gastroduodenal artery noted. Reproductive: Hysterectomy.  No evidence of adnexal mass. Other: Stable postsurgical changes in the anterior abdominal wall. No ascites or peritoneal nodularity. Musculoskeletal: No acute or significant osseous findings. Stable lumbar spine degenerative changes and small sclerotic lesion in the L1 vertebral body. IMPRESSION: 1. No significant findings in the chest. 2. Partially treated multifocal hepatic metastatic disease. In addition, there are underlying cysts and a hemangioma. 3. Further slight enlargement of a borderline aortocaval node. No other signs of progressive metastatic disease. 4. Stable right renal angiomyolipoma. Electronically Signed   By: Richardean Sale M.D.   On: 02/28/2017 09:52   Ct Abdomen Pelvis W Contrast  Result Date: 02/28/2017 CLINICAL DATA:  Restaging colon cancer status post partial right colectomy 10/26/2016. Y-90 therapy 12/07/2016. EXAM: CT CHEST, ABDOMEN, AND PELVIS WITH CONTRAST TECHNIQUE: Multidetector CT imaging of the chest, abdomen and pelvis was performed following the standard protocol during bolus administration of intravenous contrast. CONTRAST:  156mL ISOVUE-300 IOPAMIDOL (ISOVUE-300) INJECTION 61% COMPARISON:  PET-CT 10/06/2015.  Abdominopelvic CT 11/19/2016. FINDINGS: CT CHEST FINDINGS Cardiovascular: No significant vascular findings. Left subclavian Port-A-Cath extends to the lower SVC level. There are  calcifications of the mitral annulus. The heart size is normal. There is no pericardial effusion. Mediastinum/Nodes: There are no enlarged mediastinal, hilar or axillary lymph nodes. The thyroid gland, trachea and esophagus demonstrate  no significant findings. Lungs/Pleura: There is no pleural effusion. The lungs are clear. Musculoskeletal/Chest wall: No chest wall mass or suspicious osseous findings. CT ABDOMEN AND PELVIS FINDINGS Hepatobiliary: The dominant metastasis in the dome of the right hepatic lobe has decreased in size and attenuation, consistent with partial treatment. This measures 4.3 x 3.0 cm on image 49 of series 2 (previously 6.0 x 3.6 cm). Other suspected metastases on the prior studies have also decreased in size. No new or enlarging lesions are seen. There are several hepatic cysts as well as a 14 mm hemangioma inferiorly in the right lobe (image 71). No evidence of gallstones, gallbladder wall thickening or biliary dilatation. Pancreas: Unremarkable. No pancreatic ductal dilatation or surrounding inflammatory changes. Spleen: Normal in size without focal abnormality. Adrenals/Urinary Tract: Both adrenal glands appear normal. There is a grossly stable angiomyolipoma involving the posterior aspect of the mid right kidney, measuring 2.5 x 1.5 cm on image 66. No new or enlarging renal lesions are identified. There is no evidence of urinary tract calculus or hydronephrosis. The bladder appears unremarkable. Stomach/Bowel: No evidence of bowel wall thickening, distention or surrounding inflammatory change. Stable postsurgical changes from right hemicolectomy and anastomosis. Vascular/Lymphatic: Further slight enlargement in an aortocaval node measuring 9 mm on image 66 (previously 8 mm). Small mesenteric lymph nodes are stable. No acute vascular findings are seen. There is mild aortic atherosclerosis. Previously embolized gastroduodenal artery noted. Reproductive: Hysterectomy.  No evidence of adnexal  mass. Other: Stable postsurgical changes in the anterior abdominal wall. No ascites or peritoneal nodularity. Musculoskeletal: No acute or significant osseous findings. Stable lumbar spine degenerative changes and small sclerotic lesion in the L1 vertebral body. IMPRESSION: 1. No significant findings in the chest. 2. Partially treated multifocal hepatic metastatic disease. In addition, there are underlying cysts and a hemangioma. 3. Further slight enlargement of a borderline aortocaval node. No other signs of progressive metastatic disease. 4. Stable right renal angiomyolipoma. Electronically Signed   By: Richardean Sale M.D.   On: 02/28/2017 09:52   Results for Kathleen James, Kathleen James (MRN [id number]) as of 03/28/2017 10:51  Ref. Range 09/23/2015 15:04 11/03/2015 08:34 12/10/2015 08:51 12/24/2015 08:50 01/07/2016 09:07 02/02/2016 09:03 03/01/2016 11:00 03/29/2016 09:06 04/26/2016 10:31 05/24/2016 09:50 06/28/2016 08:32 08/09/2016 09:18 08/30/2016 10:13 09/13/2016 09:35 09/27/2016 09:35 10/11/2016 08:38 10/25/2016 08:19 11/04/2016 10:09 11/17/2016 08:29 12/29/2016 11:32 01/12/2017 09:29 01/26/2017 09:31 02/09/2017 09:02 02/28/2017 09:04 03/14/2017 11:30 03/23/2017 09:40  CEA Latest Ref Range: 0.0 - 4.7 ng/mL 538.7 (H) 860.5 (H) 1,167.0 (H) 658.6 (H) 415.6 (H) 242.1 (H) 173.6 (H) 118.8 (H) 100.6 (H) 91.5 (H) 94.5 (H) 141.5 (H) 181.2 (H) 197.3 (H) 215.3 (H) 254.9 (H) 300.7 (H) 254.9 (H) 268.6 (H)         CEA Latest Ref Range: 0.0 - 4.7 ng/mL                    638.7 (H) 521.8 (H) 398.7 (H) 386.8 (H) 422.2 (H) 529.1 (H) 537.2 (H)    ASSESSMENT & PLAN:   .Malignant neoplasm of ascending colon ([facility]) 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 CT Sep 10th- improved metastatic lesions to the liver; slight increase in retroperitoneal lymph nodes from 8-9 mm in size. Otherwise no lesions noted in the chest. CEA-RISING.   # Given the continued rise in the  CEA; given the slight increase in size of the retroperitoneal lymph nodes- I would recommend discontinuation  of further chemotherapy with FOLFOX plus Avastin. I would plan changing to Vec+Iri+vemurafenib given B-raf mutation.   # I discussed the palliative; but not curative nature of the treatments in detail. Patient and daughter agree. Discussed the individual side effects of above chemotherapy- Discussed the potential side effects including but not limited to-increasing fatigue, nausea vomiting, diarrhea, hair loss, sores in the mouth, increase risk of infection and also neuropathy. Discussed at length regarding skin rash/photosensitivity; and also arthralgias. Also discussed the proactive use of minocycline to avoid skin rash. Prescribed clindamycin gel recommend hydrocortisone cream.  # HOLD FOLFOX plus Avastin chemo today; labs reviewed.    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   # Elevated Alkaline phos- 178; monitor for now. No evidence of bone metastases on the recent imaging. Likely secondary chemotherapy.   # Thrombocytopenia- G-2; 95; from chemotherapy. This should improve.  # Renal lesion- likely angiomyolipoma; stable monitor for now.   # HOLD chemo today; vec+ iri in 1 week; follow up with me in 3 weeks/labs/CEA/vec+iri.Discussed withholding chemotherapy pharmacist.   # 40 minutes face-to-face with the patient discussing the above plan of care; more than 50% of time spent on prognosis/ natural history; counseling and coordination.     [name], MD 03/28/2017 1:43 PM

## 2017-03-29 ENCOUNTER — Other Ambulatory Visit (HOSPITAL_COMMUNITY): Payer: Self-pay | Admitting: Interventional Radiology

## 2017-03-29 DIAGNOSIS — C787 Secondary malignant neoplasm of liver and intrahepatic bile duct: Secondary | ICD-10-CM

## 2017-03-29 MED ORDER — VEMURAFENIB 240 MG PO TABS: 960.0000 mg | ORAL_TABLET | Freq: Two times a day (BID) | ORAL | 0 refills | Status: DC

## 2017-03-29 NOTE — Telephone Encounter (Signed)
Oral Oncology Patient Advocate Encounter  Received notification from [facility] that prior authorization for Zelboraf is required.  PA submitted on CoverMyMeds Key [id number] Status is pending  Oral Oncology Clinic will continue to follow.   [place] Patient Advocate [phone] 03/29/2017 10:10 AM

## 2017-03-30 ENCOUNTER — Encounter: Payer: Self-pay | Admitting: Internal Medicine

## 2017-03-30 ENCOUNTER — Other Ambulatory Visit: Payer: Self-pay | Admitting: Internal Medicine

## 2017-03-30 ENCOUNTER — Inpatient Hospital Stay: Payer: Medicare HMO

## 2017-03-30 ENCOUNTER — Other Ambulatory Visit: Payer: Self-pay | Admitting: *Deleted

## 2017-03-30 NOTE — Telephone Encounter (Signed)
Oral Oncology Patient Advocate Encounter  Prior Authorization for Zelboraf has been approved.    PA# [id number] Effective dates: 06/19/2016 through 06/20/2017  Co-pay is $1583.76  Oral Oncology Clinic will continue to follow.     [facility] Patient Advocate [phone] 03/30/2017 9:31 AM

## 2017-03-30 NOTE — Telephone Encounter (Signed)
Oral Oncology Patient Advocate Encounter  Met patient in Lobby to complete application for [employer] in an effort to reduce patient's out of pocket expense for Zelborah to $0.    Copay is $7544.92  Application completed and faxed to [phone].   Patient assistance phone number for follow up is [phone].   This encounter will be updated until final determination.   [facility] Patient Advocate [phone] 03/30/2017 9:35 AM

## 2017-03-30 NOTE — Telephone Encounter (Signed)
Dx:  Malignant neoplasm of ascending colon...   Ref Range & Units 7d ago  Potassium 3.5 - 5.1 mmol/L 4.1

## 2017-04-01 NOTE — Telephone Encounter (Signed)
Okay to fill Kdur 10. Thx

## 2017-04-04 ENCOUNTER — Other Ambulatory Visit: Payer: Self-pay

## 2017-04-04 ENCOUNTER — Other Ambulatory Visit: Payer: Self-pay | Admitting: *Deleted

## 2017-04-04 ENCOUNTER — Inpatient Hospital Stay: Payer: Medicare HMO

## 2017-04-04 ENCOUNTER — Other Ambulatory Visit: Payer: Self-pay | Admitting: Internal Medicine

## 2017-04-04 VITALS — BP 129/84 | HR 78 | Temp 96.0°F | Resp 18 | Wt 204.6 lb

## 2017-04-04 DIAGNOSIS — D6959 Other secondary thrombocytopenia: Secondary | ICD-10-CM | POA: Diagnosis not present

## 2017-04-04 DIAGNOSIS — Z5112 Encounter for antineoplastic immunotherapy: Secondary | ICD-10-CM | POA: Diagnosis not present

## 2017-04-04 DIAGNOSIS — C7B8 Other secondary neuroendocrine tumors: Secondary | ICD-10-CM | POA: Diagnosis not present

## 2017-04-04 DIAGNOSIS — C189 Malignant neoplasm of colon, unspecified: Secondary | ICD-10-CM

## 2017-04-04 DIAGNOSIS — C182 Malignant neoplasm of ascending colon: Secondary | ICD-10-CM

## 2017-04-04 DIAGNOSIS — R590 Localized enlarged lymph nodes: Secondary | ICD-10-CM | POA: Diagnosis not present

## 2017-04-04 DIAGNOSIS — C7A1 Malignant poorly differentiated neuroendocrine tumors: Secondary | ICD-10-CM | POA: Diagnosis not present

## 2017-04-04 DIAGNOSIS — C799 Secondary malignant neoplasm of unspecified site: Principal | ICD-10-CM

## 2017-04-04 DIAGNOSIS — C787 Secondary malignant neoplasm of liver and intrahepatic bile duct: Secondary | ICD-10-CM | POA: Diagnosis not present

## 2017-04-04 DIAGNOSIS — R970 Elevated carcinoembryonic antigen [CEA]: Secondary | ICD-10-CM | POA: Diagnosis not present

## 2017-04-04 DIAGNOSIS — Z23 Encounter for immunization: Secondary | ICD-10-CM | POA: Diagnosis not present

## 2017-04-04 DIAGNOSIS — R7989 Other specified abnormal findings of blood chemistry: Secondary | ICD-10-CM | POA: Diagnosis not present

## 2017-04-04 DIAGNOSIS — Z5111 Encounter for antineoplastic chemotherapy: Secondary | ICD-10-CM

## 2017-04-04 LAB — COMPREHENSIVE METABOLIC PANEL
ALK PHOS: 166 U/L — AB (ref 38–126)
ALT: 29 U/L (ref 14–54)
AST: 43 U/L — AB (ref 15–41)
Albumin: 3.6 g/dL (ref 3.5–5.0)
Anion gap: 10 (ref 5–15)
BUN: 15 mg/dL (ref 6–20)
CALCIUM: 9 mg/dL (ref 8.9–10.3)
CHLORIDE: 104 mmol/L (ref 101–111)
CO2: 22 mmol/L (ref 22–32)
CREATININE: 0.94 mg/dL (ref 0.44–1.00)
Glucose, Bld: 91 mg/dL (ref 65–99)
Potassium: 3.9 mmol/L (ref 3.5–5.1)
Sodium: 136 mmol/L (ref 135–145)
Total Bilirubin: 0.6 mg/dL (ref 0.3–1.2)
Total Protein: 7.6 g/dL (ref 6.5–8.1)

## 2017-04-04 LAB — CBC WITH DIFFERENTIAL/PLATELET
Basophils Absolute: 0.1 10*3/uL (ref 0–0.1)
Basophils Relative: 1 %
EOS PCT: 1 %
Eosinophils Absolute: 0 10*3/uL (ref 0–0.7)
HCT: 34.4 % — ABNORMAL LOW (ref 35.0–47.0)
HEMOGLOBIN: 11.8 g/dL — AB (ref 12.0–16.0)
LYMPHS ABS: 0.8 10*3/uL — AB (ref 1.0–3.6)
LYMPHS PCT: 20 %
MCH: 36.1 pg — AB (ref 26.0–34.0)
MCHC: 34.3 g/dL (ref 32.0–36.0)
MCV: 105.2 fL — AB (ref 80.0–100.0)
MONO ABS: 0.5 10*3/uL (ref 0.2–0.9)
MONOS PCT: 14 %
Neutro Abs: 2.6 10*3/uL (ref 1.4–6.5)
Neutrophils Relative %: 64 %
PLATELETS: 161 10*3/uL (ref 150–440)
RBC: 3.27 MIL/uL — AB (ref 3.80–5.20)
RDW: 19.2 % — ABNORMAL HIGH (ref 11.5–14.5)
WBC: 4 10*3/uL (ref 3.6–11.0)

## 2017-04-04 LAB — MAGNESIUM: MAGNESIUM: 2 mg/dL (ref 1.7–2.4)

## 2017-04-04 MED ORDER — SODIUM CHLORIDE 0.9% FLUSH
10.0000 mL | Freq: Once | INTRAVENOUS | Status: AC
  Administered 2017-04-04: 10 mL via INTRAVENOUS
  Filled 2017-04-04: qty 10

## 2017-04-04 MED ORDER — PALONOSETRON HCL INJECTION 0.25 MG/5ML
0.2500 mg | Freq: Once | INTRAVENOUS | Status: AC
  Administered 2017-04-04: 0.25 mg via INTRAVENOUS
  Filled 2017-04-04: qty 5

## 2017-04-04 MED ORDER — HEPARIN SOD (PORK) LOCK FLUSH 100 UNIT/ML IV SOLN
500.0000 [IU] | Freq: Once | INTRAVENOUS | Status: AC
  Administered 2017-04-04: 500 [IU] via INTRAVENOUS
  Filled 2017-04-04: qty 5

## 2017-04-04 MED ORDER — IRINOTECAN HCL CHEMO INJECTION 100 MG/5ML
180.0000 mg/m2 | Freq: Once | INTRAVENOUS | Status: AC
  Administered 2017-04-04: 380 mg via INTRAVENOUS
  Filled 2017-04-04: qty 15

## 2017-04-04 MED ORDER — INFLUENZA VAC SPLIT QUAD 0.5 ML IM SUSY
0.5000 mL | PREFILLED_SYRINGE | Freq: Once | INTRAMUSCULAR | Status: AC
  Administered 2017-04-04: 0.5 mL via INTRAMUSCULAR
  Filled 2017-04-04: qty 0.5

## 2017-04-04 MED ORDER — SODIUM CHLORIDE 0.9 % IV SOLN: 10.0000 mg | Freq: Once | INTRAVENOUS | Status: DC

## 2017-04-04 MED ORDER — SODIUM CHLORIDE 0.9 % IV SOLN
Freq: Once | INTRAVENOUS | Status: AC
  Administered 2017-04-04: 10:00:00 via INTRAVENOUS
  Filled 2017-04-04: qty 1000

## 2017-04-04 MED ORDER — ATROPINE SULFATE 1 MG/ML IJ SOLN
0.5000 mg | Freq: Once | INTRAMUSCULAR | Status: AC | PRN
Start: 2017-04-04 — End: 2017-04-04
  Administered 2017-04-04: 0.5 mg via INTRAVENOUS
  Filled 2017-04-04: qty 1

## 2017-04-04 MED ORDER — DEXAMETHASONE SODIUM PHOSPHATE 10 MG/ML IJ SOLN
10.0000 mg | Freq: Once | INTRAMUSCULAR | Status: AC
  Administered 2017-04-04: 10 mg via INTRAVENOUS
  Filled 2017-04-04: qty 1

## 2017-04-04 MED ORDER — SODIUM CHLORIDE 0.9 % IV SOLN
6.0000 mg/kg | Freq: Once | INTRAVENOUS | Status: AC
  Administered 2017-04-04: 560 mg via INTRAVENOUS
  Filled 2017-04-04: qty 20

## 2017-04-04 NOTE — Telephone Encounter (Signed)
Oral Oncology Patient Advocate Encounter  Received notification from [facility] Patient Assistance program that patient has been successfully enrolled into their program to receive Zelboraf from the manufacturer at $0 out of pocket until stops therapy.   I went in to infusion and told the patient and asked her to call them and they will start shipment.  Told Dr. Michaelene Song and Nira Conn it was  Approved.  Patient knows to call the office with questions or concerns.  Oral Oncology Clinic will continue to follow.  [facility] Patient Advocate [phone] 04/04/2017 10:53 AM

## 2017-04-04 NOTE — Telephone Encounter (Signed)
Oral Oncology Patient Advocate Encounter  Patient was seen in office today and was asking about her manufacture assistance program and had we heard anything yet. I called [facility] and they will try to send it thru STAT.   I went in the infusion room to let patient know what was going on and to tell Dr. Michaelene Song is aware we still have not gotten her medication and he is fine with that. I told her I would call as soon as I know something, or to call if she hears something.   Spoke with Melia C. at [facility].   [facility] Patient Advocate [phone] 04/04/2017 10:23 AM

## 2017-04-09 ENCOUNTER — Encounter: Payer: Self-pay | Admitting: Internal Medicine

## 2017-04-11 NOTE — Telephone Encounter (Signed)
Oral Oncology Patient Advocate Encounter  Called patient to make sure she had gotten her Zelborah from [place]. Patient said she received last week. I told her when she is close to refill time and she has not heard anything, please call them.    [place] Patient Advocate [phone] 04/11/2017 10:47 AM

## 2017-04-14 ENCOUNTER — Ambulatory Visit
Admission: RE | Admit: 2017-04-14 | Discharge: 2017-04-14 | Disposition: A | Discharge status: Home / Self Care | Payer: Medicare HMO | Source: Ambulatory Visit | Attending: Interventional Radiology | Admitting: Interventional Radiology

## 2017-04-14 DIAGNOSIS — C7A022 Malignant carcinoid tumor of the ascending colon: Secondary | ICD-10-CM | POA: Diagnosis not present

## 2017-04-14 DIAGNOSIS — C787 Secondary malignant neoplasm of liver and intrahepatic bile duct: Secondary | ICD-10-CM | POA: Diagnosis not present

## 2017-04-14 HISTORY — PX: IR RADIOLOGIST EVAL & MGMT: IMG5224

## 2017-04-14 NOTE — Progress Notes (Signed)
Patient ID: Kathleen James, female   DOB: 1950/10/26, 66 y.o.   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 female kindly referred to [place] last fall for consultation regarding her metastatic colonic neuroendocrine carcinoma. She was accompanied by her daughter.  The patient initially presented for ultrasound on 09/09/2015 with a history of right upper quadrant abdominal pain postprandial. Ultrasound suggested hepatic metastatic disease.  Follow-up CT confirmed multiple liver metastases measure up to 8.4 cm, as well as an ascending colon mass with right ileocolic adenopathy.  Liver core biopsy 09/26/2015 demonstrated metastatic neuroendocrine carcinoma. A follow-up colon Biopsy matched the liver pathology.  PET CT 10/06/2015 demonstrated hypermetabolic activity in the colonic lesion, ileocolic adenopathy, and liver lesions. No evidence of extra abdominal metastatic disease.  She's been treated with FOLFIRINOX, 4 cycles. This was changed to FOLFIRI+ Avastin.  Follow-up CT shows significant improvement in known disease, decrease in size of hepatic lesions dramatically. No new lesions or other evidence of progression of disease.   Subsequently in the spring of 2018 her cecal lesion was resected. Because of continued elevation of CEA, we proceeded with Y 90 radio embolization of right hepatic artery distribution. Subsequent imaging is demonstrated interval decrease in size of the ablated liver disease, some progression of right peroneal adenopathy. Therefore, chemotherapy was changed to zelboraf. She presents for consultation regarding follow-up plans.     Past Medical History:  Diagnosis Date  . Angiolipoma of kidney      followed with serial CTs ,,  Dr. Jacqlyn Larsen  . Anxiety   . Arthritis   . 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 Chemotherapy induced diarrhea   . Hammer toe   . Hypertension   . Liver cancer ([place])   . Neuro-endocrine carcinoma ([place])   . Obesity (BMI 30-39.9)     Past Surgical History:  Procedure Laterality Date  . ABDOMINAL HYSTERECTOMY  1995   endometriosis, heavy bleeding  . BUNIONECTOMY Bilateral   . CESAREAN SECTION  1985  . COLONOSCOPY WITH PROPOFOL N/A 10/09/2015   Procedure: COLONOSCOPY WITH PROPOFOL;  Surgeon: Robert Bellow, MD;  Location: [facility] ENDOSCOPY;  Service: Endoscopy;  Laterality: N/A;  . IR ANGIOGRAM SELECTIVE EACH ADDITIONAL VESSEL  11/22/2016  . IR ANGIOGRAM SELECTIVE EACH ADDITIONAL VESSEL  11/22/2016  . IR ANGIOGRAM SELECTIVE EACH ADDITIONAL VESSEL  11/22/2016  . IR ANGIOGRAM SELECTIVE EACH ADDITIONAL VESSEL  12/07/2016  . IR ANGIOGRAM SELECTIVE EACH ADDITIONAL VESSEL  12/07/2016  . IR ANGIOGRAM VISCERAL SELECTIVE  11/22/2016  . IR ANGIOGRAM VISCERAL SELECTIVE  12/07/2016  . IR EMBO ARTERIAL NOT HEMORR HEMANG INC GUIDE ROADMAPPING  11/22/2016  . IR EMBO TUMOR ORGAN ISCHEMIA INFARCT INC GUIDE ROADMAPPING  12/07/2016  . IR GENERIC HISTORICAL  04/07/2016   IR RADIOLOGIST EVAL & MGMT 04/07/2016 Arne Cleveland, MD [facility] INTERV RAD  . IR US GUIDE VASC ACCESS RIGHT  11/22/2016  . IR US GUIDE VASC ACCESS RIGHT  12/07/2016  . LAPAROSCOPIC RIGHT COLECTOMY Right 10/26/2016   Procedure: LAPAROSCOPIC RIGHT COLECTOMY;  Surgeon: Robert Bellow, MD;  Location: [facility] ORS;  Service: General;  Laterality: Right;  . OOPHORECTOMY    . PORTACATH PLACEMENT Left 10/01/2015   Procedure:  INSERTION PORT-A-CATH;  Surgeon: Robert Bellow, MD;  Location: [facility] ORS;  Service: General;  Laterality: Left;  . ROTATOR CUFF REPAIR Right 2008   right shoulder.  [place]     Allergies: Bee venom; Dilaudid [hydromorphone hcl]; and Morphine and  related  Medications: Prior to Admission medications   Medication Sig Start Date End Date Taking? Authorizing Provider  ALPRAZolam (XANAX) 0.25 MG tablet TAKE 1 TABLET TWICE DAILY AS NEEDED FOR ANXIETY OR SLEEP 10/21/16  Yes [name], MD  amLODipine (NORVASC) 5 MG tablet TAKE ONE TABLET BY MOUTH EVERY DAY 02/15/17  Yes [name], MD  Biotin w/ Vitamins C & E (HAIR/SKIN/NAILS PO) Take 1 tablet by mouth daily.   Yes [provider]  Chlorpheniramine Maleate (CHLOR-TABLETS PO) Take 1 tablet by mouth every evening.   Yes [provider]  clindamycin (CLINDAGEL) 1 % gel Apply topically 2 (two) times daily. Apply to affected areas twice a day. 03/28/17  Yes [name], MD  diphenoxylate-atropine (LOMOTIL) 2.5-0.025 MG tablet TAKE 1 TABLET FOUR TIMES DAILY AS NEEDEDFOR DIARRHEA OR LOOSE STOOLS 08/17/16  Yes [name], MD  lidocaine-prilocaine (EMLA) cream Apply 1 application topically as needed. Apply to port a cath site 1 hour prior to chemotherapy treatments 08/30/16  Yes [name], MD  loperamide (IMODIUM A-D) 2 MG tablet Take 2 mg by mouth 4 (four) times daily as needed for diarrhea or loose stools.   Yes [provider]  minocycline (MINOCIN) 100 MG capsule Take 1 capsule (100 mg total) by mouth daily. 03/28/17  Yes [name], MD  naproxen sodium (ANAPROX) 220 MG tablet Take 440 mg by mouth 2 (two) times daily as needed.   Yes [provider]  olmesartan (BENICAR) 40 MG tablet TAKE ONE TABLET BY MOUTH EVERY DAY 02/15/17  Yes [name], MD  ondansetron (ZOFRAN) 8 MG tablet Take 1 tablet (8 mg total) by mouth every 8 (eight) hours as needed for nausea or vomiting. 01/26/17  Yes [name], MD  potassium chloride (K-DUR) 10 MEQ tablet TAKE ONE TABLET BY MOUTH TWICE DAILY 03/30/17  Yes [name], MD  prochlorperazine (COMPAZINE) 10 MG tablet TAKE ONE TABLET BY MOUTH EVERY 6 HOURS AS NEEDED  FOR NAUSEA OR VOMITING 12/31/16  Yes [name], MD  vemurafenib (ZELBORAF) 240 MG tablet Take 4 tablets (960 mg total) by mouth every 12 (twelve) hours. Take with water. 03/29/17  Yes [name], MD     Family History  Problem Relation Age of Onset  . Mental illness Mother 64       bipolar, dementia  . Cancer Maternal Aunt 70       breast ca  . Hypertension Father   . Stroke Father 58       deceased  . COPD Father   . Heart disease Sister        deceased    Social History   Social History  . Marital status: Married    Spouse name: N/A  . Number of children: N/A  . Years of education: N/A   Social History Main Topics  . Smoking status: Never Smoker  . Smokeless tobacco: Never Used  . Alcohol use 0.0 oz/week     Comment: occasionally  . Drug use: No  . Sexual activity: Not Currently   Other Topics Concern  . None   Social History Narrative  . None    ECOG Status: 1 -  Symptomatic but completely ambulatory  Review of Systems: A 12 point ROS discussed and pertinent positives are indicated in the HPI above.  All other systems are negative.  Review of Systems  Vital Signs: BP 112/71 (BP Location: Left Arm, Patient Position: Sitting, Cuff Size: Normal)   Pulse (!) 101   Temp 98.6 F (37 C)   Resp 14   SpO2 98%   Physical Exam Constitutional: Oriented to person, place, and time. Well-developed and well-nourished. No distress.  HENT:  Head: Normocephalic and atraumatic.  Eyes: Conjunctivae and EOM are normal. Right eye exhibits no discharge. Left eye exhibits no discharge. No scleral icterus.  Neck: No JVD present.  Pulmonary/Chest: Effort normal. No stridor. No respiratory distress.  Abdomen: obese, soft, non distended Neurological:  alert and oriented to person, place, and time.  Skin: Skin is warm and dry.  not diaphoretic.  Psychiatric:   normal mood and affect.   behavior is normal. Judgment and thought content normal.   Mallampati  Score:     Imaging: CT CHEST, ABDOMEN, AND PELVIS WITH CONTRAST  TECHNIQUE: Multidetector CT imaging of the chest, abdomen and pelvis was performed following the standard protocol during bolus administration of intravenous contrast.  CONTRAST:  150mL ISOVUE-300 IOPAMIDOL (ISOVUE-300) INJECTION 61%  COMPARISON:  PET-CT 10/06/2015.  Abdominopelvic CT 11/19/2016.  FINDINGS: CT CHEST FINDINGS  Cardiovascular: No significant vascular findings. Left subclavian Port-A-Cath extends to the lower SVC level. There are calcifications of the mitral annulus. The heart size is normal. There is no pericardial effusion.  Mediastinum/Nodes: There are no enlarged mediastinal, hilar or axillary lymph nodes. The thyroid gland, trachea and esophagus demonstrate no significant findings.  Lungs/Pleura: There is no pleural effusion. The lungs are clear.  Musculoskeletal/Chest wall: No chest wall mass or suspicious osseous findings.  CT ABDOMEN AND PELVIS FINDINGS  Hepatobiliary: The dominant metastasis in the dome of the right hepatic lobe has decreased in size and attenuation, consistent with partial treatment. This measures 4.3 x 3.0 cm on image 49 of series 2 (previously 6.0 x 3.6 cm). Other suspected metastases on the prior studies have also decreased in size. No new or enlarging lesions are seen. There are several hepatic cysts as well as a 14 mm hemangioma inferiorly in the right lobe (image 71). No evidence of gallstones, gallbladder wall thickening or biliary dilatation.  Pancreas: Unremarkable. No pancreatic ductal dilatation or surrounding inflammatory changes.  Spleen: Normal in size without focal abnormality.  Adrenals/Urinary Tract: Both adrenal glands appear normal. There is a grossly stable angiomyolipoma involving the posterior aspect of the mid right kidney, measuring 2.5 x 1.5 cm on image 66. No new or enlarging renal lesions are identified. There is no  evidence of urinary tract calculus or hydronephrosis. The bladder appears unremarkable.  Stomach/Bowel: No evidence of bowel wall thickening, distention or surrounding inflammatory change. Stable postsurgical changes from right hemicolectomy and anastomosis.  Vascular/Lymphatic: Further slight enlargement in an aortocaval node measuring 9 mm on image 66 (previously 8 mm). Small mesenteric lymph nodes are stable. No acute vascular findings are seen. There is mild aortic atherosclerosis. Previously embolized gastroduodenal artery noted.  Reproductive: Hysterectomy.  No evidence of adnexal mass.  Other: Stable postsurgical changes in the anterior abdominal wall. No ascites or peritoneal nodularity.  Musculoskeletal: No acute or significant osseous findings. Stable lumbar spine degenerative changes and small sclerotic lesion in the L1 vertebral body.  IMPRESSION: 1. No significant findings in the chest. 2. Partially treated multifocal hepatic metastatic disease. In addition, there  are underlying cysts and a hemangioma. 3. Further slight enlargement of a borderline aortocaval node. No other signs of progressive metastatic disease. 4. Stable right renal angiomyolipoma.   Electronically Signed   By: Richardean Sale M.D.   On: 02/28/2017 09:52  Labs:  CBC:  Recent Labs  02/28/17 0904 03/14/17 1019 03/23/17 0940 04/04/17 0922  WBC 3.4* 6.9 5.1 4.0  HGB 12.0 12.0 11.7* 11.8*  HCT 34.0* 34.6* 33.9* 34.4*  PLT 216 109* 95* 161    COAGS:  Recent Labs  11/22/16 0843 12/07/16 0855  INR 0.95 0.91  APTT  --  29    BMP:  Recent Labs  02/28/17 0904 03/14/17 1019 03/23/17 0940 04/04/17 0922  NA 133* 136 134* 136  K 4.3 4.0 4.1 3.9  CL 102 108 104 104  CO2 24 23 24 22   GLUCOSE 86 99 114* 91  BUN 12 15 15 15   CALCIUM 9.2 8.8* 9.0 9.0  CREATININE 1.00 0.89 0.94 0.94  GFRNONAA 57* >60 >60 >60  GFRAA >60 >60 >60 >60    LIVER FUNCTION TESTS:  Recent  Labs  02/28/17 0904 03/14/17 1019 03/23/17 0940 04/04/17 0922  BILITOT 0.5 0.5 0.6 0.6  AST 51* 47* 42* 43*  ALT 38 32 34 29  ALKPHOS 141* 176* 178* 166*  PROT 7.8 7.6 7.9 7.6  ALBUMIN 3.8 3.7 3.8 3.6    TUMOR MARKERS:  Recent Labs  10/11/16 0838 10/25/16 0819 11/04/16 1009 11/17/16 0829  CEA 254.9* 300.7* 254.9* 268.6*    Assessment and Plan:  My impression is that her liver metastatic disease has responded well to Y90 radio embolization. Most recent scan shows continued decrease in size of the dominant liver lesion, no definite new or progressive enhancing liver disease. There is a benign hemangioma in segment 6. She does however have apparent progression of extra hepatic disease.  At this time, we will plan no additional liver directed therapies. We can follow her with imaging. Should there be any evidence of new or progressive hepatic disease, we can revisit repeat Y-90 radio-embolization versus percutaneous ablative techniques depending on number and distribution of residual/recurrent lesions.  Thank you for this interesting consult.  I greatly enjoyed meeting Kathleen James and look forward to participating in their care.  A copy of this report was sent to the requesting provider on this date.  Electronically Signed: Ellery Tash III, DAYNE Nisha Dhami 04/14/2017, 1:49 PM   I spent a total of    25 Minutes in face to face in clinical consultation, greater than 50% of which was counseling/coordinating care for hepatic metastatic disease.

## 2017-04-18 ENCOUNTER — Inpatient Hospital Stay (HOSPITAL_BASED_OUTPATIENT_CLINIC_OR_DEPARTMENT_OTHER): Payer: Medicare HMO | Admitting: Internal Medicine

## 2017-04-18 ENCOUNTER — Inpatient Hospital Stay: Payer: Medicare HMO

## 2017-04-18 VITALS — BP 110/75 | HR 105 | Temp 95.9°F | Wt 200.0 lb

## 2017-04-18 DIAGNOSIS — R7989 Other specified abnormal findings of blood chemistry: Secondary | ICD-10-CM

## 2017-04-18 DIAGNOSIS — C7B8 Other secondary neuroendocrine tumors: Secondary | ICD-10-CM | POA: Diagnosis not present

## 2017-04-18 DIAGNOSIS — M1990 Unspecified osteoarthritis, unspecified site: Secondary | ICD-10-CM | POA: Diagnosis not present

## 2017-04-18 DIAGNOSIS — C182 Malignant neoplasm of ascending colon: Secondary | ICD-10-CM | POA: Diagnosis not present

## 2017-04-18 DIAGNOSIS — E669 Obesity, unspecified: Secondary | ICD-10-CM | POA: Diagnosis not present

## 2017-04-18 DIAGNOSIS — R970 Elevated carcinoembryonic antigen [CEA]: Secondary | ICD-10-CM | POA: Diagnosis not present

## 2017-04-18 DIAGNOSIS — Z885 Allergy status to narcotic agent status: Secondary | ICD-10-CM

## 2017-04-18 DIAGNOSIS — I10 Essential (primary) hypertension: Secondary | ICD-10-CM

## 2017-04-18 DIAGNOSIS — R590 Localized enlarged lymph nodes: Secondary | ICD-10-CM

## 2017-04-18 DIAGNOSIS — N289 Disorder of kidney and ureter, unspecified: Secondary | ICD-10-CM | POA: Diagnosis not present

## 2017-04-18 DIAGNOSIS — C787 Secondary malignant neoplasm of liver and intrahepatic bile duct: Secondary | ICD-10-CM | POA: Diagnosis not present

## 2017-04-18 DIAGNOSIS — C7A1 Malignant poorly differentiated neuroendocrine tumors: Secondary | ICD-10-CM | POA: Diagnosis not present

## 2017-04-18 DIAGNOSIS — Z79899 Other long term (current) drug therapy: Secondary | ICD-10-CM | POA: Diagnosis not present

## 2017-04-18 DIAGNOSIS — R197 Diarrhea, unspecified: Secondary | ICD-10-CM

## 2017-04-18 DIAGNOSIS — Z5111 Encounter for antineoplastic chemotherapy: Secondary | ICD-10-CM

## 2017-04-18 DIAGNOSIS — Z803 Family history of malignant neoplasm of breast: Secondary | ICD-10-CM

## 2017-04-18 DIAGNOSIS — D6959 Other secondary thrombocytopenia: Secondary | ICD-10-CM | POA: Diagnosis not present

## 2017-04-18 DIAGNOSIS — Z23 Encounter for immunization: Secondary | ICD-10-CM | POA: Diagnosis not present

## 2017-04-18 DIAGNOSIS — C799 Secondary malignant neoplasm of unspecified site: Secondary | ICD-10-CM

## 2017-04-18 DIAGNOSIS — C189 Malignant neoplasm of colon, unspecified: Secondary | ICD-10-CM

## 2017-04-18 DIAGNOSIS — Z9221 Personal history of antineoplastic chemotherapy: Secondary | ICD-10-CM

## 2017-04-18 DIAGNOSIS — Z5112 Encounter for antineoplastic immunotherapy: Secondary | ICD-10-CM | POA: Diagnosis not present

## 2017-04-18 LAB — COMPREHENSIVE METABOLIC PANEL
ALBUMIN: 3.1 g/dL — AB (ref 3.5–5.0)
ALT: 24 U/L (ref 14–54)
AST: 35 U/L (ref 15–41)
Alkaline Phosphatase: 179 U/L — ABNORMAL HIGH (ref 38–126)
Anion gap: 10 (ref 5–15)
BUN: 14 mg/dL (ref 6–20)
CHLORIDE: 102 mmol/L (ref 101–111)
CO2: 21 mmol/L — AB (ref 22–32)
Calcium: 8.8 mg/dL — ABNORMAL LOW (ref 8.9–10.3)
Creatinine, Ser: 1.02 mg/dL — ABNORMAL HIGH (ref 0.44–1.00)
GFR calc Af Amer: >60 mL/min (ref >60)
GFR calc non Af Amer: 56 mL/min — ABNORMAL LOW (ref >60)
GLUCOSE: 102 mg/dL — AB (ref 65–99)
POTASSIUM: 3.6 mmol/L (ref 3.5–5.1)
Sodium: 133 mmol/L — ABNORMAL LOW (ref 135–145)
Total Bilirubin: 0.8 mg/dL (ref 0.3–1.2)
Total Protein: 7.3 g/dL (ref 6.5–8.1)

## 2017-04-18 LAB — CBC WITH DIFFERENTIAL/PLATELET
Basophils Absolute: 0 10*3/uL (ref 0–0.1)
Basophils Relative: 1 %
EOS ABS: 0 10*3/uL (ref 0–0.7)
Eosinophils Relative: 1 %
HEMATOCRIT: 32.3 % — AB (ref 35.0–47.0)
HEMOGLOBIN: 11.1 g/dL — AB (ref 12.0–16.0)
LYMPHS ABS: 0.5 10*3/uL — AB (ref 1.0–3.6)
Lymphocytes Relative: 12 %
MCH: 35.7 pg — AB (ref 26.0–34.0)
MCHC: 34.3 g/dL (ref 32.0–36.0)
MCV: 104.3 fL — AB (ref 80.0–100.0)
MONOS PCT: 14 %
Monocytes Absolute: 0.6 10*3/uL (ref 0.2–0.9)
NEUTROS ABS: 3 10*3/uL (ref 1.4–6.5)
Neutrophils Relative %: 72 %
Platelets: 161 10*3/uL (ref 150–440)
RBC: 3.1 MIL/uL — ABNORMAL LOW (ref 3.80–5.20)
RDW: 17.7 % — ABNORMAL HIGH (ref 11.5–14.5)
WBC: 4.1 10*3/uL (ref 3.6–11.0)

## 2017-04-18 LAB — PHOSPHORUS: PHOSPHORUS: 2.9 mg/dL (ref 2.5–4.6)

## 2017-04-18 LAB — MAGNESIUM: MAGNESIUM: 1.7 mg/dL (ref 1.7–2.4)

## 2017-04-18 MED ORDER — SODIUM CHLORIDE 0.9 % IV SOLN
Freq: Once | INTRAVENOUS | Status: AC
  Filled 2017-04-18: qty 1000

## 2017-04-18 MED ORDER — PALONOSETRON HCL INJECTION 0.25 MG/5ML
0.2500 mg | Freq: Once | INTRAVENOUS | Status: AC
  Administered 2017-04-18: 0.25 mg via INTRAVENOUS
  Filled 2017-04-18: qty 5

## 2017-04-18 MED ORDER — SODIUM CHLORIDE 0.9% FLUSH
10.0000 mL | INTRAVENOUS | Status: DC | PRN
  Administered 2017-04-18: 10 mL via INTRAVENOUS
  Filled 2017-04-18: qty 10

## 2017-04-18 MED ORDER — ATROPINE SULFATE 1 MG/ML IJ SOLN
0.5000 mg | Freq: Once | INTRAMUSCULAR | Status: AC | PRN
  Administered 2017-04-18: 0.5 mg via INTRAVENOUS
  Filled 2017-04-18: qty 1

## 2017-04-18 MED ORDER — DEXAMETHASONE SODIUM PHOSPHATE 10 MG/ML IJ SOLN
10.0000 mg | Freq: Once | INTRAMUSCULAR | Status: AC
  Administered 2017-04-18: 10 mg via INTRAVENOUS
  Filled 2017-04-18: qty 1

## 2017-04-18 MED ORDER — SODIUM CHLORIDE 0.9 % IV SOLN
Freq: Once | INTRAVENOUS | Status: AC
  Administered 2017-04-18: 11:00:00 via INTRAVENOUS
  Filled 2017-04-18: qty 1000

## 2017-04-18 MED ORDER — IRINOTECAN HCL CHEMO INJECTION 100 MG/5ML
180.0000 mg/m2 | Freq: Once | INTRAVENOUS | Status: AC
  Administered 2017-04-18: 380 mg via INTRAVENOUS
  Filled 2017-04-18: qty 15

## 2017-04-18 MED ORDER — PANITUMUMAB CHEMO INJECTION 100 MG/5ML
6.0000 mg/kg | Freq: Once | INTRAVENOUS | Status: AC
  Administered 2017-04-18: 560 mg via INTRAVENOUS
  Filled 2017-04-18: qty 20

## 2017-04-18 MED ORDER — HEPARIN SOD (PORK) LOCK FLUSH 100 UNIT/ML IV SOLN
500.0000 [IU] | Freq: Once | INTRAVENOUS | Status: AC
  Administered 2017-04-18: 500 [IU] via INTRAVENOUS
  Filled 2017-04-18: qty 5

## 2017-04-18 MED ORDER — DEXAMETHASONE SODIUM PHOSPHATE 100 MG/10ML IJ SOLN: 10.0000 mg | Freq: Once | INTRAMUSCULAR | Status: DC

## 2017-04-18 NOTE — Progress Notes (Signed)
Patient here today for follow up.  Patient c/o achy muscles and joints

## 2017-04-18 NOTE — Progress Notes (Signed)
[place] NOTE  [name], [name], MD (Internal Medicine) Bary Castilla, Forest Gleason, [name], RN as Registered Nurse [name], MD as Consulting Physician (Internal Medicine)  CHIEF COMPLAINTS/PURPOSE OF CONSULTATION:   Oncology History   # MARCH/APRIL 2017-NEUROENDOCRINE CA STAGE IV; [name]; [name]; [name]; [name]; [name]; [name]; [name]; July 14th  2017 CT- PR  # AUG 28th- FOLFIRI +Avastin; OCT 5th CT scan- PR.   # MARCH 20th- CT STABLE liver lesions; ? Enlarging cecal lesion [CEA rising]; March 26th- FOLFOX [last avastin march 12th 2018];   # resection of primary tumor [Dr.Byrnett]; y90- June 19th 2018.  # July 11th-FOLFOX + Avastin; SEP 2018 [CT- stable liver lesions; increasing ~10mm RP LN]; RISING CEA [stopped end of Sep 2018 ]  # OCT 15th 2018- Vemu+ Iri+ Pan    # [facility]- B-RAF [id number]- MUTATED [May 2018]  # right kidney lesion 2.6 x 1.6 cm ? Angiolipoma [followed by Urology in past]     Malignant neoplasm of ascending colon ([name])     HISTORY OF PRESENTING ILLNESS:  Kathleen James 66 y.o.  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   Patient complains of diarrhea 3-4 loose stools after her chemotherapy. Improves after taking Imodium/remotely. She also complains of fleeting arthralgias bilateral upper and lower extremities.   Patient's appetite is fair. No weight loss. Intermittent nausea no vomiting. No nosebleeds or headaches. Mild-to-moderate tingling and numbness in hands and feet. This is not interrupting  her daily lifestyle. No falls. No headaches.  ROS: A complete 10 point review of system is done which is negative except mentioned above in history of present illness  MEDICAL HISTORY:  Past Medical History:  Diagnosis Date  . Angiolipoma of kidney    followed with serial CTs ,,  Dr. Jacqlyn Larsen  . Anxiety   . Arthritis   . 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 Chemotherapy induced diarrhea   . Hammer toe   . Hypertension   . Liver cancer ([place])   . Neuro-endocrine carcinoma ([place])   . Obesity (BMI 30-39.9)     SURGICAL HISTORY: Past Surgical History:  Procedure Laterality Date  . ABDOMINAL HYSTERECTOMY  1995   endometriosis, heavy bleeding  . BUNIONECTOMY Bilateral   . CESAREAN SECTION  1985  . COLONOSCOPY WITH PROPOFOL N/A 10/09/2015   Procedure: COLONOSCOPY WITH PROPOFOL;  Surgeon: Robert Bellow, MD;  Location: [facility] ENDOSCOPY;  Service: Endoscopy;  Laterality: N/A;  . IR ANGIOGRAM SELECTIVE EACH ADDITIONAL VESSEL  11/22/2016  . IR ANGIOGRAM SELECTIVE EACH ADDITIONAL VESSEL  11/22/2016  . IR ANGIOGRAM SELECTIVE EACH ADDITIONAL VESSEL  11/22/2016  . IR ANGIOGRAM SELECTIVE EACH ADDITIONAL VESSEL  12/07/2016  . IR ANGIOGRAM SELECTIVE EACH ADDITIONAL VESSEL  12/07/2016  . IR ANGIOGRAM VISCERAL SELECTIVE  11/22/2016  . IR ANGIOGRAM VISCERAL SELECTIVE  12/07/2016  . IR EMBO ARTERIAL NOT HEMORR HEMANG INC GUIDE ROADMAPPING  11/22/2016  . IR EMBO TUMOR ORGAN ISCHEMIA INFARCT INC GUIDE ROADMAPPING  12/07/2016  . IR GENERIC HISTORICAL  04/07/2016  IR RADIOLOGIST EVAL & MGMT 04/07/2016 Arne Cleveland, MD [facility] INTERV RAD  . IR US GUIDE VASC ACCESS RIGHT  11/22/2016  . IR US GUIDE VASC ACCESS RIGHT  12/07/2016  . LAPAROSCOPIC RIGHT COLECTOMY Right 10/26/2016   Procedure: LAPAROSCOPIC RIGHT COLECTOMY;  Surgeon: Robert Bellow, MD;  Location: [facility] ORS;  Service: General;  Laterality: Right;  . OOPHORECTOMY    . PORTACATH PLACEMENT Left 10/01/2015    Procedure: INSERTION PORT-A-CATH;  Surgeon: Robert Bellow, MD;  Location: [facility] ORS;  Service: General;  Laterality: Left;  . ROTATOR CUFF REPAIR Right 2008   right shoulder.  [place]     SOCIAL HISTORY: She lives at home with her family in [place]. 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 Social History   Social History  . Marital status: Married    Spouse name: N/A  . Number of children: N/A  . Years of education: N/A   Occupational History  . Not on file.   Social History Main Topics  . Smoking status: Never Smoker  . Smokeless tobacco: Never Used  . Alcohol use 0.0 oz/week     Comment: occasionally  . Drug use: No  . Sexual activity: Not Currently   Other Topics Concern  . Not on file   Social History Narrative  . No narrative on file    FAMILY HISTORY: Family History  Problem Relation Age of Onset  . Mental illness Mother 71       bipolar, dementia  . Cancer Maternal Aunt 70       breast ca  . Hypertension Father   . Stroke Father 74       deceased  . COPD Father   . Heart disease Sister        deceased    ALLERGIES:  is allergic to bee venom; dilaudid [hydromorphone hcl]; and morphine and related.  MEDICATIONS:  Current Outpatient Prescriptions  Medication Sig Dispense Refill  . ALPRAZolam (XANAX) 0.25 MG tablet TAKE 1 TABLET TWICE DAILY AS NEEDED FOR ANXIETY OR SLEEP 60 tablet 1  . amLODipine (NORVASC) 5 MG tablet TAKE ONE TABLET BY MOUTH EVERY DAY 30 tablet 2  . Biotin w/ Vitamins C & E (HAIR/SKIN/NAILS PO) Take 1 tablet by mouth daily.    . Chlorpheniramine Maleate (CHLOR-TABLETS PO) Take 1 tablet by mouth every evening.    . clindamycin (CLINDAGEL) 1 % gel Apply topically 2 (two) times daily. Apply to affected areas twice a day. 30 g 0  . diphenoxylate-atropine (LOMOTIL) 2.5-0.025 MG tablet TAKE 1 TABLET FOUR TIMES DAILY AS NEEDEDFOR DIARRHEA OR LOOSE STOOLS 30 tablet 3  . lidocaine-prilocaine (EMLA)  cream Apply 1 application topically as needed. Apply to port a cath site 1 hour prior to chemotherapy treatments 30 g 6  . loperamide (IMODIUM A-D) 2 MG tablet Take 2 mg by mouth 4 (four) times daily as needed for diarrhea or loose stools.    . minocycline (MINOCIN) 100 MG capsule Take 1 capsule (100 mg total) by mouth daily. 60 capsule 1  . naproxen sodium (ANAPROX) 220 MG tablet Take 440 mg by mouth 2 (two) times daily as needed.    Marland Kitchen olmesartan (BENICAR) 40 MG tablet TAKE ONE TABLET BY MOUTH EVERY DAY 30 tablet 2  . ondansetron (ZOFRAN) 8 MG tablet Take 1 tablet (8 mg total) by mouth every 8 (eight) hours as needed for nausea or vomiting. 40 tablet 3  . potassium  chloride (K-DUR) 10 MEQ tablet TAKE ONE TABLET BY MOUTH TWICE DAILY 60 tablet 3  . prochlorperazine (COMPAZINE) 10 MG tablet TAKE ONE TABLET BY MOUTH EVERY 6 HOURS AS NEEDED FOR NAUSEA OR VOMITING 30 tablet 2  . vemurafenib (ZELBORAF) 240 MG tablet Take 4 tablets (960 mg total) by mouth every 12 (twelve) hours. Take with water. 240 tablet 0   No current facility-administered medications for this visit.    Facility-Administered Medications Ordered in Other Visits  Medication Dose Route Frequency Provider Last Rate Last Dose  . 0.9 %  sodium chloride infusion   Intravenous Once [name], MD   Stopped at 04/18/17 1425  . heparin lock flush 100 unit/mL  500 Units Intravenous Once Charlaine Dalton R, MD      . [COMPLETED] heparin lock flush 100 unit/mL  500 Units Intravenous Once [name], MD   500 Units at 04/18/17 1425  . irinotecan (CAMPTOSAR) 380 mg in dextrose 5 % 500 mL chemo infusion  180 mg/m2 (Treatment Plan Recorded) Intravenous Once [name], MD   Stopped at 04/18/17 1420  . sodium chloride flush (NS) 0.9 % injection 10 mL  10 mL Intravenous PRN [name], MD   10 mL at 11/24/15 0836  . sodium chloride flush (NS) 0.9 % injection 10 mL  10 mL Intravenous PRN [name], MD   10 mL at 04/18/17 1010      .  PHYSICAL EXAMINATION: ECOG PERFORMANCE STATUS: 0 - Asymptomatic  Vitals:   04/18/17 1024  BP: 110/75  Pulse: (!) 105  Temp: (!) 95.9 F (35.5 C)   Filed Weights   04/18/17 1024  Weight: 200 lb (90.7 kg)    GENERAL: Well-nourished well-developed; Alert, no distress and comfortable. Accompanied by her daughter.  EYES: no pallor or icterus OROPHARYNX: no thrush or ulceration; good dentition  NECK: supple, no masses felt LYMPH:  no palpable lymphadenopathy in the cervical, axillary or inguinal regions LUNGS: clear to auscultation and  No wheeze or crackles HEART/CVS: regular rate & rhythm and no murmurs; No lower extremity edema ABDOMEN: abdomen soft, non-tender and normal bowel sounds; positive for hepatomegaly. Musculoskeletal:no cyanosis of digits and no clubbing  PSYCH: alert & oriented x 3 with fluent speech NEURO: no focal motor/sensory deficits SKIN:  no rashes or significant lesions; Mediport is in place.   LABORATORY DATA:  I have reviewed the data as listed Lab Results  Component Value Date   WBC 4.1 04/18/2017   HGB 11.1 (L) 04/18/2017   HCT 32.3 (L) 04/18/2017   MCV 104.3 (H) 04/18/2017   PLT 161 04/18/2017    Recent Labs  03/23/17 0940 04/04/17 0922 04/18/17 1015  NA 134* 136 133*  K 4.1 3.9 3.6  CL 104 104 102  CO2 24 22 21*  GLUCOSE 114* 91 102*  BUN 15 15 14   CREATININE 0.94 0.94 1.02*  CALCIUM 9.0 9.0 8.8*  GFRNONAA >60 >60 56*  GFRAA >60 >60 >60  PROT 7.9 7.6 7.3  ALBUMIN 3.8 3.6 3.1*  AST 42* 43* 35  ALT 34 29 24  ALKPHOS 178* 166* 179*  BILITOT 0.6 0.6 0.8   Results for Kathleen James, Kathleen James (MRN [id number]) as of 02/09/2017 23:23  Ref. Range 09/23/2015 15:04 11/03/2015 08:34 12/10/2015 08:51 12/24/2015 08:50 01/07/2016 09:07 02/02/2016 09:03 03/01/2016 11:00 03/29/2016 09:06 04/26/2016 10:31 05/24/2016 09:50 06/28/2016 08:32 08/09/2016 09:18 08/30/2016 10:13 09/13/2016 09:35 09/27/2016 09:35 10/11/2016 08:38 10/25/2016  08:19 11/04/2016 10:09 11/17/2016 08:29 12/29/2016  11:32 01/12/2017 09:29 01/26/2017 09:31  CEA Latest Ref Range: 0.0 - 4.7 ng/mL 538.7 (H) 860.5 (H) 1,167.0 (H) 658.6 (H) 415.6 (H) 242.1 (H) 173.6 (H) 118.8 (H) 100.6 (H) 91.5 (H) 94.5 (H) 141.5 (H) 181.2 (H) 197.3 (H) 215.3 (H) 254.9 (H) 300.7 (H) 254.9 (H) 268.6 (H)     CEA Latest Ref Range: 0.0 - 4.7 ng/mL                    638.7 (H) 521.8 (H) 398.7 (H)   RADIOGRAPHIC STUDIES: I have personally reviewed the radiological images as listed and agreed with the findings in the report. No results found. Results for Kathleen James, Kathleen James (MRN [id number]) as of 03/28/2017 10:51  Ref. Range 09/23/2015 15:04 11/03/2015 08:34 12/10/2015 08:51 12/24/2015 08:50 01/07/2016 09:07 02/02/2016 09:03 03/01/2016 11:00 03/29/2016 09:06 04/26/2016 10:31 05/24/2016 09:50 06/28/2016 08:32 08/09/2016 09:18 08/30/2016 10:13 09/13/2016 09:35 09/27/2016 09:35 10/11/2016 08:38 10/25/2016 08:19 11/04/2016 10:09 11/17/2016 08:29 12/29/2016 11:32 01/12/2017 09:29 01/26/2017 09:31 02/09/2017 09:02 02/28/2017 09:04 03/14/2017 11:30 03/23/2017 09:40  CEA Latest Ref Range: 0.0 - 4.7 ng/mL 538.7 (H) 860.5 (H) 1,167.0 (H) 658.6 (H) 415.6 (H) 242.1 (H) 173.6 (H) 118.8 (H) 100.6 (H) 91.5 (H) 94.5 (H) 141.5 (H) 181.2 (H) 197.3 (H) 215.3 (H) 254.9 (H) 300.7 (H) 254.9 (H) 268.6 (H)         CEA Latest Ref Range: 0.0 - 4.7 ng/mL                    638.7 (H) 521.8 (H) 398.7 (H) 386.8 (H) 422.2 (H) 529.1 (H) 537.2 (H)    ASSESSMENT & PLAN:   .Malignant neoplasm of ascending colon ([facility]) 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 CT Sep 10th- improved metastatic lesions to the liver; slight increase in retroperitoneal lymph nodes from 8-9 mm in size. CEA-RISING. Currently on Vec+Iri+vemurafenib given B-raf mutation status post cycle #1. Patient tolerating chemotherapy with mild to moderate difficulties-diarrhea/arthralgias [C discussion  below]  # Proceed with cycle #2 of vectibex plus irinotecan today. Labs today reviewed;  acceptable for treatment today. Arthralgias-see discussion below. For now continue current dose of vemu.  # Arthralgias likely secondary to vemurafenib. If persistent continues to get worse I would recommend- to other option of Enco+ Bini+ Vectibex. Discussed that it might help her diarrhea/arthralgias. Patient call us and give [place] an update in a week from now- if she is open to switching the therapy.  # diarrhea- Recommend Lomotil as needed.  #  PN-1-Sec to Oxaliplatin.   # Elevated Alkaline phos- 178; monitor for now. No evidence of bone metastases on the recent imaging. Likely secondary chemotherapy.   # follow up with me in 2 weeks/labs/CEA/vec+iri. Will call in 1 week for update- if worse- switch to vwc+enco+bini. 1 week- bmp/IVFs.     [name], MD 04/18/2017 1:40 PM

## 2017-04-18 NOTE — Assessment & Plan Note (Addendum)
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 CT Sep 10th- improved metastatic lesions to the liver; slight increase in retroperitoneal lymph nodes from 8-9 mm in size. CEA-RISING. Currently on Vec+Iri+vemurafenib given B-raf mutation status post cycle #1. Patient tolerating chemotherapy with mild to moderate difficulties-diarrhea/arthralgias [C discussion below]  # Proceed with cycle #2 of vectibex plus irinotecan today. Labs today reviewed;  acceptable for treatment today. Arthralgias-see discussion below. For now continue current dose of vemu.  # Arthralgias likely secondary to vemurafenib. If persistent continues to get worse I would recommend- to other option of Enco+ Bini+ Vectibex. Discussed that it might help her diarrhea/arthralgias. Patient call us and give [place] an update in a week from now- if she is open to switching the therapy.  # diarrhea- Recommend Lomotil as needed.  #  PN-1-Sec to Oxaliplatin.   # Elevated Alkaline phos- 178; monitor for now. No evidence of bone metastases on the recent imaging. Likely secondary chemotherapy.   # follow up with me in 2 weeks/labs/CEA/vec+iri. Will call in 1 week for update- if worse- switch to vwc+enco+bini. 1 week- bmp/IVFs.

## 2017-04-19 LAB — CEA: CEA1: 397.9 ng/mL — AB (ref 0.0–4.7)

## 2017-04-20 ENCOUNTER — Encounter: Payer: Self-pay | Admitting: Internal Medicine

## 2017-04-20 ENCOUNTER — Encounter: Payer: Self-pay | Admitting: Interventional Radiology

## 2017-04-21 ENCOUNTER — Other Ambulatory Visit: Payer: Self-pay | Admitting: *Deleted

## 2017-04-21 DIAGNOSIS — F411 Generalized anxiety disorder: Secondary | ICD-10-CM

## 2017-04-21 MED ORDER — ALPRAZOLAM 0.25 MG PO TABS: 0.2500 mg | ORAL_TABLET | Freq: Two times a day (BID) | ORAL | 1 refills | Status: DC | PRN

## 2017-04-25 ENCOUNTER — Encounter: Payer: Self-pay | Admitting: Internal Medicine

## 2017-04-25 ENCOUNTER — Inpatient Hospital Stay: Payer: Medicare HMO

## 2017-04-25 ENCOUNTER — Inpatient Hospital Stay: Payer: Medicare HMO | Attending: Internal Medicine

## 2017-04-25 ENCOUNTER — Other Ambulatory Visit: Payer: Self-pay | Admitting: Internal Medicine

## 2017-04-25 VITALS — BP 102/71 | HR 97 | Temp 97.8°F | Resp 18 | Wt 200.0 lb

## 2017-04-25 DIAGNOSIS — R21 Rash and other nonspecific skin eruption: Secondary | ICD-10-CM | POA: Diagnosis not present

## 2017-04-25 DIAGNOSIS — R197 Diarrhea, unspecified: Secondary | ICD-10-CM | POA: Diagnosis not present

## 2017-04-25 DIAGNOSIS — D709 Neutropenia, unspecified: Secondary | ICD-10-CM | POA: Insufficient documentation

## 2017-04-25 DIAGNOSIS — Z5112 Encounter for antineoplastic immunotherapy: Secondary | ICD-10-CM | POA: Insufficient documentation

## 2017-04-25 DIAGNOSIS — C787 Secondary malignant neoplasm of liver and intrahepatic bile duct: Secondary | ICD-10-CM | POA: Insufficient documentation

## 2017-04-25 DIAGNOSIS — M1990 Unspecified osteoarthritis, unspecified site: Secondary | ICD-10-CM | POA: Insufficient documentation

## 2017-04-25 DIAGNOSIS — Z79899 Other long term (current) drug therapy: Secondary | ICD-10-CM | POA: Insufficient documentation

## 2017-04-25 DIAGNOSIS — C182 Malignant neoplasm of ascending colon: Secondary | ICD-10-CM | POA: Diagnosis not present

## 2017-04-25 DIAGNOSIS — Z885 Allergy status to narcotic agent status: Secondary | ICD-10-CM | POA: Diagnosis not present

## 2017-04-25 DIAGNOSIS — R748 Abnormal levels of other serum enzymes: Secondary | ICD-10-CM | POA: Insufficient documentation

## 2017-04-25 DIAGNOSIS — E669 Obesity, unspecified: Secondary | ICD-10-CM | POA: Diagnosis not present

## 2017-04-25 DIAGNOSIS — C7A8 Other malignant neuroendocrine tumors: Secondary | ICD-10-CM

## 2017-04-25 DIAGNOSIS — C189 Malignant neoplasm of colon, unspecified: Secondary | ICD-10-CM

## 2017-04-25 DIAGNOSIS — I10 Essential (primary) hypertension: Secondary | ICD-10-CM | POA: Insufficient documentation

## 2017-04-25 DIAGNOSIS — M2550 Pain in unspecified joint: Secondary | ICD-10-CM | POA: Diagnosis not present

## 2017-04-25 DIAGNOSIS — C799 Secondary malignant neoplasm of unspecified site: Secondary | ICD-10-CM

## 2017-04-25 DIAGNOSIS — R970 Elevated carcinoembryonic antigen [CEA]: Secondary | ICD-10-CM

## 2017-04-25 LAB — COMPREHENSIVE METABOLIC PANEL
ALK PHOS: 170 U/L — AB (ref 38–126)
ALT: 24 U/L (ref 14–54)
AST: 31 U/L (ref 15–41)
Albumin: 3 g/dL — ABNORMAL LOW (ref 3.5–5.0)
Anion gap: 8 (ref 5–15)
BILIRUBIN TOTAL: 0.8 mg/dL (ref 0.3–1.2)
BUN: 22 mg/dL — AB (ref 6–20)
CALCIUM: 8.5 mg/dL — AB (ref 8.9–10.3)
CHLORIDE: 102 mmol/L (ref 101–111)
CO2: 19 mmol/L — ABNORMAL LOW (ref 22–32)
CREATININE: 1.05 mg/dL — AB (ref 0.44–1.00)
GFR, EST NON AFRICAN AMERICAN: 54 mL/min — AB (ref >60)
Glucose, Bld: 96 mg/dL (ref 65–99)
Potassium: 4.2 mmol/L (ref 3.5–5.1)
Sodium: 129 mmol/L — ABNORMAL LOW (ref 135–145)
TOTAL PROTEIN: 7.4 g/dL (ref 6.5–8.1)

## 2017-04-25 LAB — CBC WITH DIFFERENTIAL/PLATELET
BASOS PCT: 2 %
Basophils Absolute: 0 10*3/uL (ref 0–0.1)
EOS ABS: 0 10*3/uL (ref 0–0.7)
EOS PCT: 8 %
HCT: 30 % — ABNORMAL LOW (ref 35.0–47.0)
HEMOGLOBIN: 10.2 g/dL — AB (ref 12.0–16.0)
Lymphocytes Relative: 63 %
Lymphs Abs: 0.4 10*3/uL — ABNORMAL LOW (ref 1.0–3.6)
MCH: 35.2 pg — AB (ref 26.0–34.0)
MCHC: 34 g/dL (ref 32.0–36.0)
MCV: 103.6 fL — ABNORMAL HIGH (ref 80.0–100.0)
Monocytes Absolute: 0 10*3/uL — ABNORMAL LOW (ref 0.2–0.9)
Monocytes Relative: 7 %
NEUTROS PCT: 21 %
Neutro Abs: 0.1 10*3/uL — ABNORMAL LOW (ref 1.4–6.5)
PLATELETS: 112 10*3/uL — AB (ref 150–440)
RBC: 2.89 MIL/uL — AB (ref 3.80–5.20)
RDW: 16.6 % — ABNORMAL HIGH (ref 11.5–14.5)
WBC: 0.6 10*3/uL — AB (ref 3.6–11.0)

## 2017-04-25 LAB — URINALYSIS, COMPLETE (UACMP) WITH MICROSCOPIC
Bilirubin Urine: NEGATIVE
GLUCOSE, UA: NEGATIVE mg/dL
KETONES UR: NEGATIVE mg/dL
Leukocytes, UA: NEGATIVE
Nitrite: NEGATIVE
PH: 5 (ref 5.0–8.0)
PROTEIN: 30 mg/dL — AB
SPECIFIC GRAVITY, URINE: 1.03 (ref 1.005–1.030)

## 2017-04-25 MED ORDER — HEPARIN SOD (PORK) LOCK FLUSH 100 UNIT/ML IV SOLN
500.0000 [IU] | Freq: Once | INTRAVENOUS | Status: AC | PRN
  Administered 2017-04-25: 500 [IU]

## 2017-04-25 MED ORDER — SODIUM CHLORIDE 0.9 % IJ SOLN
10.0000 mL | INTRAMUSCULAR | Status: DC | PRN
  Administered 2017-04-25: 10 mL
  Filled 2017-04-25: qty 10

## 2017-04-25 MED ORDER — SODIUM CHLORIDE 0.9 % IV SOLN
Freq: Once | INTRAVENOUS | Status: AC
  Administered 2017-04-25: 14:00:00 via INTRAVENOUS
  Filled 2017-04-25: qty 1000

## 2017-04-26 LAB — CEA: CEA1: 263.6 ng/mL — AB (ref 0.0–4.7)

## 2017-05-02 ENCOUNTER — Inpatient Hospital Stay (HOSPITAL_BASED_OUTPATIENT_CLINIC_OR_DEPARTMENT_OTHER): Payer: Medicare HMO | Admitting: Internal Medicine

## 2017-05-02 ENCOUNTER — Inpatient Hospital Stay: Payer: Medicare HMO

## 2017-05-02 ENCOUNTER — Other Ambulatory Visit: Payer: Self-pay | Admitting: *Deleted

## 2017-05-02 ENCOUNTER — Telehealth: Payer: Self-pay | Admitting: Internal Medicine

## 2017-05-02 DIAGNOSIS — C182 Malignant neoplasm of ascending colon: Secondary | ICD-10-CM

## 2017-05-02 DIAGNOSIS — R748 Abnormal levels of other serum enzymes: Secondary | ICD-10-CM

## 2017-05-02 DIAGNOSIS — D709 Neutropenia, unspecified: Secondary | ICD-10-CM

## 2017-05-02 DIAGNOSIS — M1990 Unspecified osteoarthritis, unspecified site: Secondary | ICD-10-CM | POA: Diagnosis not present

## 2017-05-02 DIAGNOSIS — I10 Essential (primary) hypertension: Secondary | ICD-10-CM

## 2017-05-02 DIAGNOSIS — Z5112 Encounter for antineoplastic immunotherapy: Secondary | ICD-10-CM | POA: Diagnosis not present

## 2017-05-02 DIAGNOSIS — E669 Obesity, unspecified: Secondary | ICD-10-CM | POA: Diagnosis not present

## 2017-05-02 DIAGNOSIS — C189 Malignant neoplasm of colon, unspecified: Secondary | ICD-10-CM

## 2017-05-02 DIAGNOSIS — C799 Secondary malignant neoplasm of unspecified site: Secondary | ICD-10-CM

## 2017-05-02 DIAGNOSIS — R970 Elevated carcinoembryonic antigen [CEA]: Secondary | ICD-10-CM

## 2017-05-02 DIAGNOSIS — Z885 Allergy status to narcotic agent status: Secondary | ICD-10-CM | POA: Diagnosis not present

## 2017-05-02 DIAGNOSIS — R197 Diarrhea, unspecified: Secondary | ICD-10-CM

## 2017-05-02 DIAGNOSIS — Z79899 Other long term (current) drug therapy: Secondary | ICD-10-CM | POA: Diagnosis not present

## 2017-05-02 DIAGNOSIS — C787 Secondary malignant neoplasm of liver and intrahepatic bile duct: Secondary | ICD-10-CM

## 2017-05-02 LAB — CBC WITH DIFFERENTIAL/PLATELET
BASOS PCT: 0 %
Basophils Absolute: 0 10*3/uL (ref 0–0.1)
EOS ABS: 0.1 10*3/uL (ref 0–0.7)
Eosinophils Relative: 2 %
HCT: 28.7 % — ABNORMAL LOW (ref 35.0–47.0)
HEMOGLOBIN: 9.7 g/dL — AB (ref 12.0–16.0)
LYMPHS ABS: 0.5 10*3/uL — AB (ref 1.0–3.6)
Lymphocytes Relative: 19 %
MCH: 34.9 pg — AB (ref 26.0–34.0)
MCHC: 33.9 g/dL (ref 32.0–36.0)
MCV: 103.1 fL — ABNORMAL HIGH (ref 80.0–100.0)
Monocytes Absolute: 0.4 10*3/uL (ref 0.2–0.9)
Monocytes Relative: 12 %
NEUTROS PCT: 67 %
Neutro Abs: 1.9 10*3/uL (ref 1.4–6.5)
Platelets: 256 10*3/uL (ref 150–440)
RBC: 2.79 MIL/uL — AB (ref 3.80–5.20)
RDW: 17.6 % — ABNORMAL HIGH (ref 11.5–14.5)
WBC: 2.9 10*3/uL — AB (ref 3.6–11.0)

## 2017-05-02 LAB — COMPREHENSIVE METABOLIC PANEL
ALBUMIN: 2.6 g/dL — AB (ref 3.5–5.0)
ALT: 21 U/L (ref 14–54)
AST: 37 U/L (ref 15–41)
Alkaline Phosphatase: 162 U/L — ABNORMAL HIGH (ref 38–126)
Anion gap: 8 (ref 5–15)
BILIRUBIN TOTAL: 0.6 mg/dL (ref 0.3–1.2)
BUN: 16 mg/dL (ref 6–20)
CHLORIDE: 107 mmol/L (ref 101–111)
CO2: 21 mmol/L — ABNORMAL LOW (ref 22–32)
CREATININE: 1.02 mg/dL — AB (ref 0.44–1.00)
Calcium: 8.8 mg/dL — ABNORMAL LOW (ref 8.9–10.3)
GFR calc Af Amer: >60 mL/min (ref >60)
GFR, EST NON AFRICAN AMERICAN: 56 mL/min — AB (ref >60)
GLUCOSE: 100 mg/dL — AB (ref 65–99)
POTASSIUM: 3.5 mmol/L (ref 3.5–5.1)
Sodium: 136 mmol/L (ref 135–145)
Total Protein: 7.4 g/dL (ref 6.5–8.1)

## 2017-05-02 LAB — MAGNESIUM: MAGNESIUM: 1.6 mg/dL — AB (ref 1.7–2.4)

## 2017-05-02 MED ORDER — SODIUM CHLORIDE 0.9 % IV SOLN
Freq: Once | INTRAVENOUS | Status: AC
  Administered 2017-05-02: 10:00:00 via INTRAVENOUS
  Filled 2017-05-02: qty 1000

## 2017-05-02 MED ORDER — SODIUM CHLORIDE 0.9% FLUSH
10.0000 mL | Freq: Once | INTRAVENOUS | Status: AC
  Administered 2017-05-02: 10 mL via INTRAVENOUS
  Filled 2017-05-02: qty 10

## 2017-05-02 MED ORDER — SODIUM CHLORIDE 0.9 % IV SOLN
6.0000 mg/kg | Freq: Once | INTRAVENOUS | Status: AC
  Administered 2017-05-02: 560 mg via INTRAVENOUS
  Filled 2017-05-02: qty 20

## 2017-05-02 MED ORDER — HEPARIN SOD (PORK) LOCK FLUSH 100 UNIT/ML IV SOLN
500.0000 [IU] | Freq: Once | INTRAVENOUS | Status: AC
  Administered 2017-05-02: 500 [IU] via INTRAVENOUS
  Filled 2017-05-02: qty 5

## 2017-05-02 MED ORDER — BINIMETINIB 15 MG PO TABS: 45.0000 mg | ORAL_TABLET | Freq: Two times a day (BID) | ORAL | 4 refills | Status: DC

## 2017-05-02 MED ORDER — ENCORAFENIB 75 MG PO CAPS
300.0000 mg | ORAL_CAPSULE | Freq: Every day | ORAL | 4 refills | Status: DC
Start: 2017-05-02 — End: 2017-09-07

## 2017-05-02 NOTE — Assessment & Plan Note (Addendum)
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 CT Sep 10th- improved metastatic lesions to the liver; slight increase in retroperitoneal lymph nodes from 8-9 mm in size.   #  Currently on Vec+Iri+vemurafenib given B-raf mutation status post cycle #2.; CEA improving.  Patient tolerating chemotherapy with moderate-severe difficulties-diarrhea/arthralgias [C discussion below]  # Proceed with cycle #3  of vectibex ONLY today. Discontinue irinotecan.   # Arthralgias likely secondary to vemurafenib.Discontinue Vemurafenib. I would recommend option of Enco+ Bini+ Vectibex. Discussed that it might help her diarrhea/arthralgias/neutropenia. Will initiate Enco+ Bini today.   # diarrhea-improved. Recommend continued Lomotil as needed.  #Neutropenia-today white count is 2.9 ANC is 1.4; however most recently ANC-100.  Likely secondary to irinotecan.    # Elevated Alkaline phos- 160s; monitor for now. No evidence of bone metastases on the recent imaging. Likely secondary chemotherapy.   # follow up with me in 2 weeks/labs/CEA/vec. Vectibex Only today. Check Mag today.

## 2017-05-02 NOTE — Telephone Encounter (Addendum)
Oral Oncology Patient Advocate Encounter  Received notification from [facility] that prior authorization for [employer] is required.  PA submitted on CoverMyMeds Key [id number] For [employer]  Key [id number] for [facility] Status is pending  [facility] will continue to follow.  [place] Patient Advocate [phone] 05/02/2017 3:22 PM

## 2017-05-02 NOTE — Progress Notes (Signed)
[place] NOTE  [name], [name], MD (Internal Medicine) Bary Castilla, Forest Gleason, [name], RN as Registered Nurse [name], MD as Consulting Physician (Internal Medicine)  CHIEF COMPLAINTS/PURPOSE OF CONSULTATION:   Oncology History   # MARCH/APRIL 2017-NEUROENDOCRINE CA STAGE IV; [name]; [name]; [name]; [name]; [name]; [name]; [name]; July 14th  2017 CT- PR  # AUG 28th- FOLFIRI +Avastin; OCT 5th CT scan- PR.   # MARCH 20th- CT STABLE liver lesions; ? Enlarging cecal lesion [CEA rising]; March 26th- FOLFOX [last avastin march 12th 2018];   # resection of primary tumor [Dr.Byrnett]; y90- June 19th 2018.  # July 11th-FOLFOX + Avastin; SEP 2018 [CT- stable liver lesions; increasing ~60mm RP LN]; RISING CEA [stopped end of Sep 2018 ]  # OCT 15th 2018- Vemu+ Iri+ Pan [mid nov stopped iri- neutropenia/dia+vemu- arthralgias]; Start enco+Bini+vectibex   # [facility]- B-RAF V600E- MUTATED [May 2018]  # right kidney lesion 2.6 x 1.6 cm ? Angiolipoma [followed by Urology in past]     Malignant neoplasm of ascending colon ([place])     HISTORY OF PRESENTING ILLNESS:  Kathleen James 66 y.o.  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  Zelboraf currently on hold for the last 1 week because of significant arthritis.  Patient states her arthritis are fairly intense.  It has been interrupting her daily lifestyle.  Diarrhea is improved.  However her appetite is poor.  Overall she feels poorly on the current regimen.  ROS: A complete 10  point review of system is done which is negative except mentioned above in history of present illness  MEDICAL HISTORY:  Past Medical History:  Diagnosis Date  . Angiolipoma of kidney    followed with serial CTs ,,  Dr. Jacqlyn Larsen  . Anxiety   . Arthritis   . 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 Chemotherapy induced diarrhea   . Hammer toe   . Hypertension   . Liver cancer ([place])   . Neuro-endocrine carcinoma ([place])   . Obesity (BMI 30-39.9)     SURGICAL HISTORY: Past Surgical History:  Procedure Laterality Date  . ABDOMINAL HYSTERECTOMY  1995   endometriosis, heavy bleeding  . BUNIONECTOMY Bilateral   . CESAREAN SECTION  1985  . IR ANGIOGRAM SELECTIVE EACH ADDITIONAL VESSEL  11/22/2016  . IR ANGIOGRAM SELECTIVE EACH ADDITIONAL VESSEL  11/22/2016  . IR ANGIOGRAM SELECTIVE EACH ADDITIONAL VESSEL  11/22/2016  . IR ANGIOGRAM SELECTIVE EACH ADDITIONAL VESSEL  12/07/2016  . IR ANGIOGRAM SELECTIVE EACH ADDITIONAL VESSEL  12/07/2016  . IR ANGIOGRAM VISCERAL SELECTIVE  11/22/2016  . IR ANGIOGRAM VISCERAL SELECTIVE  12/07/2016  . IR EMBO ARTERIAL NOT HEMORR HEMANG INC GUIDE ROADMAPPING  11/22/2016  . IR EMBO TUMOR ORGAN ISCHEMIA INFARCT INC GUIDE ROADMAPPING  12/07/2016  . IR GENERIC HISTORICAL  04/07/2016   IR RADIOLOGIST EVAL & MGMT 04/07/2016 Arne Cleveland, MD [facility] INTERV RAD  . IR RADIOLOGIST EVAL & MGMT  04/14/2017  . IR US GUIDE VASC ACCESS RIGHT  11/22/2016  . IR US  GUIDE VASC ACCESS RIGHT  12/07/2016  . OOPHORECTOMY    . ROTATOR CUFF REPAIR Right 2008   right shoulder.  [place]     SOCIAL HISTORY: She lives at home with her family in [place]. 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 Social History   Socioeconomic History  . Marital status: Married    Spouse name: Not on file  . Number of children: Not on file  . Years of education: Not on file  . Highest education level: Not on file   Social Needs  . Financial resource strain: Not on file  . Food insecurity - worry: Not on file  . Food insecurity - inability: Not on file  . Transportation needs - medical: Not on file  . Transportation needs - non-medical: Not on file  Occupational History  . Not on file  Tobacco Use  . Smoking status: Never Smoker  . Smokeless tobacco: Never Used  Substance and Sexual Activity  . Alcohol use: Yes    Alcohol/week: 0.0 oz    Comment: occasionally  . Drug use: No  . Sexual activity: Not Currently  Other Topics Concern  . Not on file  Social History Narrative  . Not on file    FAMILY HISTORY: Family History  Problem Relation Age of Onset  . Mental illness Mother 66       bipolar, dementia  . Cancer Maternal Aunt 70       breast ca  . Hypertension Father   . Stroke Father 27       deceased  . COPD Father   . Heart disease Sister        deceased    ALLERGIES:  is allergic to bee venom; dilaudid [hydromorphone hcl]; and morphine and related.  MEDICATIONS:  Current Outpatient Medications  Medication Sig Dispense Refill  . ALPRAZolam (XANAX) 0.25 MG tablet Take 1 tablet (0.25 mg total) by mouth 2 (two) times daily as needed for anxiety. 60 tablet 1  . amLODipine (NORVASC) 5 MG tablet TAKE ONE TABLET BY MOUTH EVERY DAY 30 tablet 2  . Biotin w/ Vitamins C & E (HAIR/SKIN/NAILS PO) Take 1 tablet by mouth daily.    . Chlorpheniramine Maleate (CHLOR-TABLETS PO) Take 1 tablet by mouth every evening.    . clindamycin (CLINDAGEL) 1 % gel Apply topically 2 (two) times daily. Apply to affected areas twice a day. 30 g 0  . diphenoxylate-atropine (LOMOTIL) 2.5-0.025 MG tablet TAKE 1 TABLET FOUR TIMES DAILY AS NEEDEDFOR DIARRHEA OR LOOSE STOOLS 30 tablet 3  . lidocaine-prilocaine (EMLA) cream Apply 1 application topically as needed. Apply to port a cath site 1 hour prior to chemotherapy treatments 30 g 6  . loperamide (IMODIUM A-D) 2 MG tablet Take 2 mg by mouth 4 (four) times daily  as needed for diarrhea or loose stools.    . minocycline (MINOCIN) 100 MG capsule Take 1 capsule (100 mg total) by mouth daily. 60 capsule 1  . naproxen sodium (ANAPROX) 220 MG tablet Take 440 mg by mouth 2 (two) times daily as needed.    Marland Kitchen olmesartan (BENICAR) 40 MG tablet TAKE ONE TABLET BY MOUTH EVERY DAY 30 tablet 2  . ondansetron (ZOFRAN) 8 MG tablet Take 1 tablet (8 mg total) by mouth every 8 (eight) hours as needed for nausea or vomiting. 40 tablet 3  . potassium chloride (K-DUR) 10 MEQ tablet TAKE ONE TABLET BY MOUTH TWICE DAILY 60 tablet  3  . prochlorperazine (COMPAZINE) 10 MG tablet TAKE ONE TABLET BY MOUTH EVERY 6 HOURS AS NEEDED FOR NAUSEA OR VOMITING 30 tablet 2  . Binimetinib 15 MG TABS Take 45 mg every 12 (twelve) hours by mouth. 180 tablet 4  . Encorafenib 75 MG CAPS Take 300 mg daily by mouth. 120 capsule 4  . vemurafenib (ZELBORAF) 240 MG tablet Take 4 tablets (960 mg total) by mouth every 12 (twelve) hours. Take with water. (Patient not taking: Reported on 05/02/2017) 240 tablet 0   No current facility-administered medications for this visit.    Facility-Administered Medications Ordered in Other Visits  Medication Dose Route Frequency Provider Last Rate Last Dose  . heparin lock flush 100 unit/mL  500 Units Intravenous Once Charlaine Dalton R, MD      . sodium chloride flush (NS) 0.9 % injection 10 mL  10 mL Intravenous PRN [name], MD   10 mL at 11/24/15 0836      .  PHYSICAL EXAMINATION: ECOG PERFORMANCE STATUS: 0 - Asymptomatic  Vitals:   05/02/17 0845  BP: 128/86  Pulse: 100  Resp: 16  Temp: 98 F (36.7 C)   Filed Weights   05/02/17 0845  Weight: 200 lb 12.8 oz (91.1 kg)    GENERAL: Well-nourished well-developed; Alert, no distress and comfortable. Accompanied by her daughter.  EYES: no pallor or icterus OROPHARYNX: no thrush or ulceration; good dentition  NECK: supple, no masses felt LYMPH:  no palpable lymphadenopathy in the  cervical, axillary or inguinal regions LUNGS: clear to auscultation and  No wheeze or crackles HEART/CVS: regular rate & rhythm and no murmurs; No lower extremity edema ABDOMEN: abdomen soft, non-tender and normal bowel sounds; positive for hepatomegaly. Musculoskeletal:no cyanosis of digits and no clubbing  PSYCH: alert & oriented x 3 with fluent speech NEURO: no focal motor/sensory deficits SKIN:  no rashes or significant lesions; Mediport is in place.   LABORATORY DATA:  I have reviewed the data as listed Lab Results  Component Value Date   WBC 2.9 (L) 05/02/2017   HGB 9.7 (L) 05/02/2017   HCT 28.7 (L) 05/02/2017   MCV 103.1 (H) 05/02/2017   PLT 256 05/02/2017   Recent Labs    04/18/17 1015 04/25/17 1300 05/02/17 0825  NA 133* 129* 136  K 3.6 4.2 3.5  CL 102 102 107  CO2 21* 19* 21*  GLUCOSE 102* 96 100*  BUN 14 22* 16  CREATININE 1.02* 1.05* 1.02*  CALCIUM 8.8* 8.5* 8.8*  GFRNONAA 56* 54* 56*  GFRAA >60 >60 >60  PROT 7.3 7.4 7.4  ALBUMIN 3.1* 3.0* 2.6*  AST 35 31 37  ALT 24 24 21   ALKPHOS 179* 170* 162*  BILITOT 0.8 0.8 0.6   Results for Kathleen James, Kathleen James (MRN [id number]) as of 02/09/2017 23:23  Ref. Range 09/23/2015 15:04 11/03/2015 08:34 12/10/2015 08:51 12/24/2015 08:50 01/07/2016 09:07 02/02/2016 09:03 03/01/2016 11:00 03/29/2016 09:06 04/26/2016 10:31 05/24/2016 09:50 06/28/2016 08:32 08/09/2016 09:18 08/30/2016 10:13 09/13/2016 09:35 09/27/2016 09:35 10/11/2016 08:38 10/25/2016 08:19 11/04/2016 10:09 11/17/2016 08:29 12/29/2016 11:32 01/12/2017 09:29 01/26/2017 09:31  CEA Latest Ref Range: 0.0 - 4.7 ng/mL 538.7 (H) 860.5 (H) 1,167.0 (H) 658.6 (H) 415.6 (H) 242.1 (H) 173.6 (H) 118.8 (H) 100.6 (H) 91.5 (H) 94.5 (H) 141.5 (H) 181.2 (H) 197.3 (H) 215.3 (H) 254.9 (H) 300.7 (H) 254.9 (H) 268.6 (H)     CEA Latest Ref Range: 0.0 - 4.7 ng/mL  638.7 (H) 521.8 (H) 398.7 (H)   RADIOGRAPHIC STUDIES: I have personally reviewed the radiological images as listed and agreed with the  findings in the report. Ir Radiologist Eval & Mgmt  Result Date: 04/20/2017 Please refer to notes tab for details about interventional procedure. (Op Note)  Results for Kathleen James, Kathleen James (MRN [id number]) as of 03/28/2017 10:51  Ref. Range 09/23/2015 15:04 11/03/2015 08:34 12/10/2015 08:51 12/24/2015 08:50 01/07/2016 09:07 02/02/2016 09:03 03/01/2016 11:00 03/29/2016 09:06 04/26/2016 10:31 05/24/2016 09:50 06/28/2016 08:32 08/09/2016 09:18 08/30/2016 10:13 09/13/2016 09:35 09/27/2016 09:35 10/11/2016 08:38 10/25/2016 08:19 11/04/2016 10:09 11/17/2016 08:29 12/29/2016 11:32 01/12/2017 09:29 01/26/2017 09:31 02/09/2017 09:02 02/28/2017 09:04 03/14/2017 11:30 03/23/2017 09:40  CEA Latest Ref Range: 0.0 - 4.7 ng/mL 538.7 (H) 860.5 (H) 1,167.0 (H) 658.6 (H) 415.6 (H) 242.1 (H) 173.6 (H) 118.8 (H) 100.6 (H) 91.5 (H) 94.5 (H) 141.5 (H) 181.2 (H) 197.3 (H) 215.3 (H) 254.9 (H) 300.7 (H) 254.9 (H) 268.6 (H)         CEA Latest Ref Range: 0.0 - 4.7 ng/mL                    638.7 (H) 521.8 (H) 398.7 (H) 386.8 (H) 422.2 (H) 529.1 (H) 537.2 (H)    ASSESSMENT & PLAN:   .Malignant neoplasm of ascending colon ([facility]) 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 CT Sep 10th- improved metastatic lesions to the liver; slight increase in retroperitoneal lymph nodes from 8-9 mm in size.   #  Currently on Vec+Iri+vemurafenib given B-raf mutation status post cycle #2.; CEA improving.  Patient tolerating chemotherapy with moderate-severe difficulties-diarrhea/arthralgias [C discussion below]  # Proceed with cycle #3  of vectibex ONLY today. Discontinue irinotecan.   # Arthralgias likely secondary to vemurafenib.Discontinue Vemurafenib. I would recommend option of Enco+ Bini+ Vectibex. Discussed that it might help her diarrhea/arthralgias/neutropenia. Will initiate Enco+ Bini today.   # diarrhea-improved. Recommend continued Lomotil as  needed.  #Neutropenia-today white count is 2.9 ANC is 1.4; however most recently ANC-100.  Likely secondary to irinotecan.    # Elevated Alkaline phos- 160s; monitor for now. No evidence of bone metastases on the recent imaging. Likely secondary chemotherapy.   # follow up with me in 2 weeks/labs/CEA/vec. Vectibex Only today. Check Mag today.     [name], MD 05/02/2017 1:10 PM

## 2017-05-02 NOTE — Telephone Encounter (Signed)
Oral Oncology Patient Advocate Encounter  Met patient in enfusion room to complete application for [employer] in an effort to reduce patient's out of pocket expense for  Braftovi & Mektovi to $0.    Application completed and faxed to [phone].   Patient assistance phone number for follow up is [phone].   This encounter will be updated until final determination.   [place] Patient Advocate [phone] 05/02/2017 12:23 PM

## 2017-05-03 ENCOUNTER — Telehealth: Payer: Self-pay | Admitting: Pharmacist

## 2017-05-03 ENCOUNTER — Other Ambulatory Visit: Payer: Self-pay | Admitting: *Deleted

## 2017-05-03 ENCOUNTER — Encounter: Payer: Self-pay | Admitting: Internal Medicine

## 2017-05-03 LAB — CEA: CEA1: 155.8 ng/mL — AB (ref 0.0–4.7)

## 2017-05-03 MED ORDER — OLMESARTAN MEDOXOMIL 40 MG PO TABS: 40.0000 mg | ORAL_TABLET | Freq: Every day | ORAL | 4 refills | Status: DC

## 2017-05-03 MED ORDER — AMLODIPINE BESYLATE 5 MG PO TABS: 5.0000 mg | ORAL_TABLET | Freq: Every day | ORAL | 4 refills | Status: DC

## 2017-05-03 NOTE — Telephone Encounter (Addendum)
Oral Oncology Pharmacist Encounter  Received new prescription for Braftovi and Mektovi (encorafenib and binimetinib) for the treatment of metastatic colon cancer in conjunction with cetuximab, planned duration until disease progression or unacceptable drug toxicity.  Patient was previously on Irinotecan, vectibex, and Vemurafib but is intolerate to the regimen.   The combination of encorafenib, binimetinib, and cetuximab is based on the available information for the [facility] CRC study. The use of encorafenib and binimetinib is off label. It has been approved by her insurance but with a high copay. Patient Advocate Mellissa Kohut has sent in a manufacturer application.   CMP from 05/02/17 assessed, no relevant lab abnormalities. Prescription dose and frequency assessed using available information from the [employer] trial.   Current medication list in Epic reviewed, one DDIs with encorafenib identified, all based on the risk of QTC prolongation. When used in combination with Zofran there is risk of QTc prolongation. As the Zofran is prn, Ms. Wafer should have more QTc monitoring if she is using either or those medications regularly.   Oral Oncology Clinic will continue to follow for medication access, initial counseling and start date.  Darl Pikes, PharmD, BCPS Hematology/Oncology Clinical Pharmacist [facility] [phone]  05/03/2017 2:43 PM

## 2017-05-03 NOTE — Telephone Encounter (Signed)
Oral Oncology Patient Advocate Encounter  Prior Authorization for [facility] & Kathi Der has been approved.    PA# [id number] Effective dates: 06/19/2016 through 06/20/2017  Oral Oncology Clinic will continue to follow.    [place] Patient Advocate [phone] 05/03/2017 9:04 AM

## 2017-05-05 ENCOUNTER — Other Ambulatory Visit: Payer: Self-pay | Admitting: Internal Medicine

## 2017-05-05 ENCOUNTER — Telehealth: Payer: Self-pay

## 2017-05-05 DIAGNOSIS — C799 Secondary malignant neoplasm of unspecified site: Principal | ICD-10-CM

## 2017-05-05 DIAGNOSIS — C189 Malignant neoplasm of colon, unspecified: Secondary | ICD-10-CM

## 2017-05-05 NOTE — Telephone Encounter (Signed)
2D ECHO has been ordered.

## 2017-05-05 NOTE — Telephone Encounter (Signed)
-----   Message from [name], MD sent at 05/04/2017  9:26 PM EST ----- Regarding: RE: Baseline ECHO  Thanks Alysson for the recommendations.  H/B- please order 2D echo for this patient ASAP.  Thx GB ----- Message ----- From: Darl Pikes, [facility] Sent: 05/03/2017   4:27 PM To: [name], MD Subject: Baseline ECHO                                  Dr. Jacinto Reap,  It's recommended with the [facility] (binimetinib) that patients get a ECHO at baseline, one month after starting, and then every 2-3 months to watch for EF changes. I do not see a ECHO to use for baseline for Ms. Pellegrin.  Could be order one to be done before see gets started on the new medications?  -Alyson

## 2017-05-06 NOTE — Telephone Encounter (Signed)
Oral Oncology Patient Advocate Encounter   Received a call from [employer] that they needed a copy of foundations that are closed. I faxed over three to them. Need before they can approve.   [place] Patient Advocate [phone] 05/06/2017 11:14 AM

## 2017-05-09 NOTE — Telephone Encounter (Signed)
Oral Oncology Patient Advocate Encounter  Called [employer] to check on application. Had to leave a message for them to call me back.    [place] Patient Advocate [phone] 05/09/2017 10:20 AM

## 2017-05-09 NOTE — Telephone Encounter (Signed)
Oral Oncology Patient Advocate Encounter  Received notification from [employer] Patient Assistance program that patient has been successfully enrolled into their program to receive Braftovi & Mektovi from the manufacturer at $0 out of pocket until 06/20/2017.   I called and spoke with patient.  She knows we will have to re-apply.   Patient knows to call the office with questions or concerns.  Oral Oncology Clinic will continue to follow.  [place] Patient Advocate [phone] 05/09/2017 1:21 PM

## 2017-05-10 ENCOUNTER — Ambulatory Visit
Admission: RE | Admit: 2017-05-10 | Discharge: 2017-05-10 | Disposition: A | Discharge status: Home / Self Care | Payer: Medicare HMO | Source: Ambulatory Visit | Attending: Internal Medicine | Admitting: Internal Medicine

## 2017-05-10 DIAGNOSIS — I5030 Unspecified diastolic (congestive) heart failure: Secondary | ICD-10-CM | POA: Diagnosis not present

## 2017-05-10 DIAGNOSIS — C189 Malignant neoplasm of colon, unspecified: Secondary | ICD-10-CM | POA: Diagnosis not present

## 2017-05-10 DIAGNOSIS — C799 Secondary malignant neoplasm of unspecified site: Secondary | ICD-10-CM

## 2017-05-10 DIAGNOSIS — I051 Rheumatic mitral insufficiency: Secondary | ICD-10-CM | POA: Insufficient documentation

## 2017-05-10 DIAGNOSIS — I340 Nonrheumatic mitral (valve) insufficiency: Secondary | ICD-10-CM | POA: Diagnosis not present

## 2017-05-10 NOTE — Progress Notes (Signed)
*  PRELIMINARY RESULTS* Echocardiogram 2D Echocardiogram has been performed.  Kathleen James 05/10/2017, 11:34 AM

## 2017-05-10 NOTE — Telephone Encounter (Signed)
Oral Oncology Patient Advocate Encounter   Patients medication will be mailed out today to patient from manufacture.   [place] Patient Advocate [phone] 05/10/2017 10:17 AM

## 2017-05-11 ENCOUNTER — Encounter: Payer: Self-pay | Admitting: *Deleted

## 2017-05-16 ENCOUNTER — Inpatient Hospital Stay (HOSPITAL_BASED_OUTPATIENT_CLINIC_OR_DEPARTMENT_OTHER): Payer: Medicare HMO | Admitting: Internal Medicine

## 2017-05-16 ENCOUNTER — Other Ambulatory Visit: Payer: Self-pay

## 2017-05-16 ENCOUNTER — Inpatient Hospital Stay: Payer: Medicare HMO

## 2017-05-16 VITALS — BP 128/85 | HR 86 | Temp 97.8°F | Resp 20 | Ht 65.0 in | Wt 200.2 lb

## 2017-05-16 DIAGNOSIS — R21 Rash and other nonspecific skin eruption: Secondary | ICD-10-CM | POA: Diagnosis not present

## 2017-05-16 DIAGNOSIS — I10 Essential (primary) hypertension: Secondary | ICD-10-CM

## 2017-05-16 DIAGNOSIS — M2550 Pain in unspecified joint: Secondary | ICD-10-CM

## 2017-05-16 DIAGNOSIS — R197 Diarrhea, unspecified: Secondary | ICD-10-CM

## 2017-05-16 DIAGNOSIS — Z79899 Other long term (current) drug therapy: Secondary | ICD-10-CM | POA: Diagnosis not present

## 2017-05-16 DIAGNOSIS — R748 Abnormal levels of other serum enzymes: Secondary | ICD-10-CM

## 2017-05-16 DIAGNOSIS — M1990 Unspecified osteoarthritis, unspecified site: Secondary | ICD-10-CM

## 2017-05-16 DIAGNOSIS — Z5112 Encounter for antineoplastic immunotherapy: Secondary | ICD-10-CM | POA: Diagnosis not present

## 2017-05-16 DIAGNOSIS — C182 Malignant neoplasm of ascending colon: Secondary | ICD-10-CM

## 2017-05-16 DIAGNOSIS — C787 Secondary malignant neoplasm of liver and intrahepatic bile duct: Secondary | ICD-10-CM | POA: Diagnosis not present

## 2017-05-16 DIAGNOSIS — Z885 Allergy status to narcotic agent status: Secondary | ICD-10-CM | POA: Diagnosis not present

## 2017-05-16 DIAGNOSIS — E669 Obesity, unspecified: Secondary | ICD-10-CM | POA: Diagnosis not present

## 2017-05-16 LAB — CBC WITH DIFFERENTIAL/PLATELET
Basophils Absolute: 0.1 10*3/uL (ref 0–0.1)
Basophils Relative: 2 %
EOS ABS: 0.1 10*3/uL (ref 0–0.7)
EOS PCT: 1 %
HCT: 33.7 % — ABNORMAL LOW (ref 35.0–47.0)
Hemoglobin: 11.2 g/dL — ABNORMAL LOW (ref 12.0–16.0)
LYMPHS ABS: 1.2 10*3/uL (ref 1.0–3.6)
LYMPHS PCT: 27 %
MCH: 34.4 pg — AB (ref 26.0–34.0)
MCHC: 33.3 g/dL (ref 32.0–36.0)
MCV: 103.3 fL — AB (ref 80.0–100.0)
MONO ABS: 0.5 10*3/uL (ref 0.2–0.9)
MONOS PCT: 10 %
Neutro Abs: 2.6 10*3/uL (ref 1.4–6.5)
Neutrophils Relative %: 60 %
PLATELETS: 319 10*3/uL (ref 150–440)
RBC: 3.26 MIL/uL — ABNORMAL LOW (ref 3.80–5.20)
RDW: 18 % — AB (ref 11.5–14.5)
WBC: 4.4 10*3/uL (ref 3.6–11.0)

## 2017-05-16 LAB — COMPREHENSIVE METABOLIC PANEL
ALBUMIN: 3.1 g/dL — AB (ref 3.5–5.0)
ALT: 28 U/L (ref 14–54)
AST: 43 U/L — AB (ref 15–41)
Alkaline Phosphatase: 146 U/L — ABNORMAL HIGH (ref 38–126)
Anion gap: 6 (ref 5–15)
BUN: 13 mg/dL (ref 6–20)
CHLORIDE: 106 mmol/L (ref 101–111)
CO2: 23 mmol/L (ref 22–32)
CREATININE: 0.99 mg/dL (ref 0.44–1.00)
Calcium: 8.7 mg/dL — ABNORMAL LOW (ref 8.9–10.3)
GFR calc Af Amer: >60 mL/min (ref >60)
GFR calc non Af Amer: 58 mL/min — ABNORMAL LOW (ref >60)
GLUCOSE: 86 mg/dL (ref 65–99)
POTASSIUM: 4.1 mmol/L (ref 3.5–5.1)
Sodium: 135 mmol/L (ref 135–145)
Total Bilirubin: 0.6 mg/dL (ref 0.3–1.2)
Total Protein: 7.3 g/dL (ref 6.5–8.1)

## 2017-05-16 LAB — MAGNESIUM: MAGNESIUM: 1.8 mg/dL (ref 1.7–2.4)

## 2017-05-16 MED ORDER — SODIUM CHLORIDE 0.9 % IV SOLN
6.0000 mg/kg | Freq: Once | INTRAVENOUS | Status: AC
  Administered 2017-05-16: 560 mg via INTRAVENOUS
  Filled 2017-05-16: qty 20

## 2017-05-16 MED ORDER — SODIUM CHLORIDE 0.9 % IV SOLN
Freq: Once | INTRAVENOUS | Status: AC
  Administered 2017-05-16: 10:00:00 via INTRAVENOUS
  Filled 2017-05-16: qty 1000

## 2017-05-16 MED ORDER — HEPARIN SOD (PORK) LOCK FLUSH 100 UNIT/ML IV SOLN
500.0000 [IU] | Freq: Once | INTRAVENOUS | Status: AC
  Administered 2017-05-16: 500 [IU] via INTRAVENOUS

## 2017-05-16 MED ORDER — SODIUM CHLORIDE 0.9% FLUSH
10.0000 mL | INTRAVENOUS | Status: DC | PRN
  Filled 2017-05-16: qty 10

## 2017-05-16 NOTE — Assessment & Plan Note (Addendum)
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 CT Sep 10th- improved metastatic lesions to the liver; slight increase in retroperitoneal lymph nodes from 8-9 mm in size. Patient currently on Vectibex+ enco+bini.    # Proceed with cycle #3 Vectibex; continue Enco+ Bini.  Tolerating well without any major side effects. No obvious progression noted.  # Arthralgias likely secondary to vemurafenib- resolved.  # diarrhea-improved. Recommend continued Lomotil as needed.  # Skin rash- G-12; recommend starting back on mino.   # Elevated Alkaline phos- 160s; monitor for now; improving.  # follow up with me in 2 weeks/labs/CEA/vec. Vectibex Only today.will plan CT scan towards end of dec 2018.

## 2017-05-16 NOTE — Progress Notes (Signed)
[place] NOTE  [name], [name], MD (Internal Medicine) Bary Castilla, Forest Gleason, [name], RN as Registered Nurse [name], MD as Consulting Physician (Internal Medicine)  CHIEF COMPLAINTS/PURPOSE OF CONSULTATION:   Oncology History   # MARCH/APRIL 2017-NEUROENDOCRINE CA STAGE IV; [name]; [name]; [name]; [name]; [name]; [name]; [name]; July 14th  2017 CT- PR  # AUG 28th- FOLFIRI +Avastin; OCT 5th CT scan- PR.   # MARCH 20th- CT STABLE liver lesions; ? Enlarging cecal lesion [CEA rising]; March 26th- FOLFOX [last avastin march 12th 2018];   # resection of primary tumor [Dr.Byrnett]; y90- June 19th 2018.  # July 11th-FOLFOX + Avastin; SEP 2018 [CT- stable liver lesions; increasing ~48mm RP LN]; RISING CEA [stopped end of Sep 2018 ]  # OCT 15th 2018- Vemu+ Iri+ Pan [mid nov stopped iri- neutropenia/dia+vemu- arthralgias]; 05/11/2017-Started enco+Bini+vectibex   # FOUNDATION ONE- B-RAF V600E- MUTATED [May 2018]  # right kidney lesion 2.6 x 1.6 cm ? Angiolipoma [followed by Urology in past]     Malignant neoplasm of ascending colon ([place])     HISTORY OF PRESENTING ILLNESS:  Kathleen James 66 y.o.  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  Arthritis resolved. Diarrhea is improved. Denies any visual symptoms. Denies any chest pain or shortness of breath or cough. Denies abdominal pain. Noted to have worsening skin rash on the torso face and the back. Patient is not on minocycline.  ROS: A complete 10 point  review of system is done which is negative except mentioned above in history of present illness  MEDICAL HISTORY:  Past Medical History:  Diagnosis Date  . Angiolipoma of kidney    followed with serial CTs ,,  Dr. Jacqlyn Larsen  . Anxiety   . Arthritis   . 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 Chemotherapy induced diarrhea   . Hammer toe   . Hypertension   . Liver cancer ([place])   . Neuro-endocrine carcinoma ([place])   . Obesity (BMI 30-39.9)     SURGICAL HISTORY: Past Surgical History:  Procedure Laterality Date  . ABDOMINAL HYSTERECTOMY  1995   endometriosis, heavy bleeding  . BUNIONECTOMY Bilateral   . CESAREAN SECTION  1985  . COLONOSCOPY WITH PROPOFOL N/A 10/09/2015   Procedure: COLONOSCOPY WITH PROPOFOL;  Surgeon: Robert Bellow, MD;  Location[facility] ENDOSCOPY;  Service: Endoscopy;  Laterality: N/A;  . IR ANGIOGRAM SELECTIVE EACH ADDITIONAL VESSEL  11/22/2016  . IR ANGIOGRAM SELECTIVE EACH ADDITIONAL VESSEL  11/22/2016  . IR ANGIOGRAM SELECTIVE EACH ADDITIONAL VESSEL  11/22/2016  . IR ANGIOGRAM SELECTIVE EACH ADDITIONAL VESSEL  12/07/2016  . IR ANGIOGRAM SELECTIVE EACH ADDITIONAL VESSEL  12/07/2016  . IR ANGIOGRAM VISCERAL SELECTIVE  11/22/2016  . IR ANGIOGRAM VISCERAL SELECTIVE  12/07/2016  . IR EMBO ARTERIAL NOT HEMORR HEMANG INC GUIDE ROADMAPPING  11/22/2016  . IR EMBO TUMOR ORGAN ISCHEMIA INFARCT INC GUIDE ROADMAPPING  12/07/2016  . IR GENERIC HISTORICAL  04/07/2016   IR RADIOLOGIST EVAL & MGMT 04/07/2016 Arne Cleveland, MD [facility]  INTERV RAD  . IR RADIOLOGIST EVAL & MGMT  04/14/2017  . IR US GUIDE VASC ACCESS RIGHT  11/22/2016  . IR US GUIDE VASC ACCESS RIGHT  12/07/2016  . LAPAROSCOPIC RIGHT COLECTOMY Right 10/26/2016   Procedure: LAPAROSCOPIC RIGHT COLECTOMY;  Surgeon: Robert Bellow, MD;  Location: [facility] ORS;  Service: General;  Laterality: Right;  . OOPHORECTOMY    . PORTACATH PLACEMENT Left 10/01/2015   Procedure: INSERTION  PORT-A-CATH;  Surgeon: Robert Bellow, MD;  Location: [facility] ORS;  Service: General;  Laterality: Left;  . ROTATOR CUFF REPAIR Right 2008   right shoulder.  [place]     SOCIAL HISTORY: She lives at home with her family in [place]. 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 Social History   Socioeconomic History  . Marital status: Married    Spouse name: Not on file  . Number of children: Not on file  . Years of education: Not on file  . Highest education level: Not on file  Social Needs  . Financial resource strain: Not on file  . Food insecurity - worry: Not on file  . Food insecurity - inability: Not on file  . Transportation needs - medical: Not on file  . Transportation needs - non-medical: Not on file  Occupational History  . Not on file  Tobacco Use  . Smoking status: Never Smoker  . Smokeless tobacco: Never Used  Substance and Sexual Activity  . Alcohol use: Yes    Alcohol/week: 0.0 oz    Comment: occasionally  . Drug use: No  . Sexual activity: Not Currently  Other Topics Concern  . Not on file  Social History Narrative  . Not on file    FAMILY HISTORY: Family History  Problem Relation Age of Onset  . Mental illness Mother 34       bipolar, dementia  . Cancer Maternal Aunt 70       breast ca  . Hypertension Father   . Stroke Father 44       deceased  . COPD Father   . Heart disease Sister        deceased    ALLERGIES:  is allergic to bee venom; dilaudid [hydromorphone hcl]; and morphine and related.  MEDICATIONS:  Current Outpatient Medications  Medication Sig Dispense Refill  . ALPRAZolam (XANAX) 0.25 MG tablet Take 1 tablet (0.25 mg total) by mouth 2 (two) times daily as needed for anxiety. 60 tablet 1  . amLODipine (NORVASC) 5 MG tablet Take 1 tablet (5 mg total) daily by mouth. 30 tablet 4  . Binimetinib 15 MG TABS Take 45 mg every 12 (twelve) hours by mouth. 180 tablet 4  . Biotin w/ Vitamins C & E  (HAIR/SKIN/NAILS PO) Take 1 tablet by mouth daily.    . Chlorpheniramine Maleate (CHLOR-TABLETS PO) Take 1 tablet by mouth every evening.    . clindamycin (CLINDAGEL) 1 % gel Apply topically 2 (two) times daily. Apply to affected areas twice a day. 30 g 0  . Encorafenib 75 MG CAPS Take 300 mg daily by mouth. 120 capsule 4  . lidocaine-prilocaine (EMLA) cream Apply 1 application topically as needed. Apply to port a cath site 1 hour prior to chemotherapy treatments 30 g 6  . loperamide (IMODIUM A-D) 2 MG tablet Take 2 mg by mouth 4 (four) times daily as needed for diarrhea or loose stools.    Marland Kitchen olmesartan (BENICAR) 40 MG tablet Take  1 tablet (40 mg total) daily by mouth. 30 tablet 4  . ondansetron (ZOFRAN) 8 MG tablet Take 1 tablet (8 mg total) by mouth every 8 (eight) hours as needed for nausea or vomiting. 40 tablet 3  . potassium chloride (K-DUR) 10 MEQ tablet TAKE ONE TABLET BY MOUTH TWICE DAILY 60 tablet 3  . prochlorperazine (COMPAZINE) 10 MG tablet TAKE ONE TABLET BY MOUTH EVERY 6 HOURS AS NEEDED FOR NAUSEA OR VOMITING 30 tablet 2  . diphenoxylate-atropine (LOMOTIL) 2.5-0.025 MG tablet TAKE 1 TABLET FOUR TIMES DAILY AS NEEDEDFOR DIARRHEA OR LOOSE STOOLS (Patient not taking: Reported on 05/16/2017) 30 tablet 3  . minocycline (MINOCIN) 100 MG capsule Take 1 capsule (100 mg total) by mouth daily. (Patient not taking: Reported on 05/16/2017) 60 capsule 1  . naproxen sodium (ANAPROX) 220 MG tablet Take 440 mg by mouth 2 (two) times daily as needed.     No current facility-administered medications for this visit.    Facility-Administered Medications Ordered in Other Visits  Medication Dose Route Frequency Provider Last Rate Last Dose  . heparin lock flush 100 unit/mL  500 Units Intravenous Once Charlaine Dalton R, MD      . sodium chloride flush (NS) 0.9 % injection 10 mL  10 mL Intravenous PRN [name], MD   10 mL at 11/24/15 0836      .  PHYSICAL EXAMINATION: ECOG  PERFORMANCE STATUS: 0 - Asymptomatic  Vitals:   05/16/17 0909  BP: 128/85  Pulse: 86  Resp: 20  Temp: 97.8 F (36.6 C)   Filed Weights   05/16/17 0909  Weight: 200 lb 3.2 oz (90.8 kg)    GENERAL: Well-nourished well-developed; Alert, no distress and comfortable. Accompanied by her daughter.  EYES: no pallor or icterus OROPHARYNX: no thrush or ulceration; good dentition  NECK: supple, no masses felt LYMPH:  no palpable lymphadenopathy in the cervical, axillary or inguinal regions LUNGS: clear to auscultation and  No wheeze or crackles HEART/CVS: regular rate & rhythm and no murmurs; No lower extremity edema ABDOMEN: abdomen soft, non-tender and normal bowel sounds; positive for hepatomegaly. Musculoskeletal:no cyanosis of digits and no clubbing  PSYCH: alert & oriented x 3 with fluent speech NEURO: no focal motor/sensory deficits SKIN:  Acne-like rash noted on the torso/face.  LABORATORY DATA:  I have reviewed the data as listed Lab Results  Component Value Date   WBC 4.4 05/16/2017   HGB 11.2 (L) 05/16/2017   HCT 33.7 (L) 05/16/2017   MCV 103.3 (H) 05/16/2017   PLT 319 05/16/2017   Recent Labs    04/25/17 1300 05/02/17 0825 05/16/17 0853  NA 129* 136 135  K 4.2 3.5 4.1  CL 102 107 106  CO2 19* 21* 23  GLUCOSE 96 100* 86  BUN 22* 16 13  CREATININE 1.05* 1.02* 0.99  CALCIUM 8.5* 8.8* 8.7*  GFRNONAA 54* 56* 58*  GFRAA >60 >60 >60  PROT 7.4 7.4 7.3  ALBUMIN 3.0* 2.6* 3.1*  AST 31 37 43*  ALT 24 21 28   ALKPHOS 170* 162* 146*  BILITOT 0.8 0.6 0.6   Results for Kathleen James, Kathleen James (MRN [id number]) as of 05/16/2017 09:33  Ref. Range 03/14/2017 11:30 03/23/2017 09:40 04/18/2017 10:15 04/25/2017 13:00 05/02/2017 08:25  CEA Latest Ref Range: 0.0 - 4.7 ng/mL 529.1 (H) 537.2 (H) 397.9 (H) 263.6 (H) 155.8 (H)    RADIOGRAPHIC STUDIES: I have personally reviewed the radiological images as listed and agreed with the findings in the report. No results  found.    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 CT Sep 10th- improved metastatic lesions to the liver; slight increase in retroperitoneal lymph nodes from 8-9 mm in size. Patient currently on Vectibex+ enco+bini.    # Proceed with cycle #3 Vectibex; continue Enco+ Bini.  Tolerating well without any major side effects. No obvious progression noted.  # Arthralgias likely secondary to vemurafenib- resolved.  # diarrhea-improved. Recommend continued Lomotil as needed.  # Skin rash- G-12; recommend starting back on mino.   # Elevated Alkaline phos- 160s; monitor for now; improving.  # follow up with me in 2 weeks/labs/CEA/vec. Vectibex Only today.will plan CT scan towards end of dec 2018.     [name], MD 05/17/2017 7:40 PM

## 2017-05-16 NOTE — Progress Notes (Signed)
Patient started benimetrinib / encorafenib on Thursday, 05/12/17. She reports 5-6 loose stools per day. She is only using imodium but has lomotil available for diarrhea.

## 2017-05-17 ENCOUNTER — Encounter: Payer: Self-pay | Admitting: *Deleted

## 2017-05-17 LAB — CEA: CEA1: 213 ng/mL — AB (ref 0.0–4.7)

## 2017-05-20 ENCOUNTER — Encounter: Payer: Self-pay | Admitting: Internal Medicine

## 2017-05-23 ENCOUNTER — Telehealth: Payer: Self-pay | Admitting: Internal Medicine

## 2017-05-23 NOTE — Telephone Encounter (Signed)
Oral Oncology Patient Advocate Encounter    Did re-enrollment for patients [employer] for her [employer].  Faxed today 05/23/2017.   [facility] Patient Advocate [phone] 05/23/2017 10:05 AM

## 2017-05-25 ENCOUNTER — Inpatient Hospital Stay: Payer: Medicare HMO

## 2017-05-25 ENCOUNTER — Telehealth: Payer: Self-pay | Admitting: *Deleted

## 2017-05-25 ENCOUNTER — Encounter: Payer: Self-pay | Admitting: *Deleted

## 2017-05-25 ENCOUNTER — Inpatient Hospital Stay: Payer: Medicare HMO | Attending: Oncology | Admitting: Oncology

## 2017-05-25 ENCOUNTER — Encounter: Payer: Self-pay | Admitting: Oncology

## 2017-05-25 ENCOUNTER — Other Ambulatory Visit: Payer: Self-pay

## 2017-05-25 DIAGNOSIS — R21 Rash and other nonspecific skin eruption: Secondary | ICD-10-CM | POA: Diagnosis not present

## 2017-05-25 DIAGNOSIS — D649 Anemia, unspecified: Secondary | ICD-10-CM | POA: Diagnosis not present

## 2017-05-25 DIAGNOSIS — R5383 Other fatigue: Secondary | ICD-10-CM | POA: Diagnosis not present

## 2017-05-25 DIAGNOSIS — R110 Nausea: Secondary | ICD-10-CM | POA: Diagnosis not present

## 2017-05-25 DIAGNOSIS — G620 Drug-induced polyneuropathy: Secondary | ICD-10-CM | POA: Insufficient documentation

## 2017-05-25 DIAGNOSIS — M1990 Unspecified osteoarthritis, unspecified site: Secondary | ICD-10-CM | POA: Diagnosis not present

## 2017-05-25 DIAGNOSIS — L270 Generalized skin eruption due to drugs and medicaments taken internally: Secondary | ICD-10-CM | POA: Diagnosis not present

## 2017-05-25 DIAGNOSIS — E860 Dehydration: Secondary | ICD-10-CM

## 2017-05-25 DIAGNOSIS — C787 Secondary malignant neoplasm of liver and intrahepatic bile duct: Secondary | ICD-10-CM

## 2017-05-25 DIAGNOSIS — R944 Abnormal results of kidney function studies: Secondary | ICD-10-CM | POA: Insufficient documentation

## 2017-05-25 DIAGNOSIS — N179 Acute kidney failure, unspecified: Secondary | ICD-10-CM | POA: Insufficient documentation

## 2017-05-25 DIAGNOSIS — C182 Malignant neoplasm of ascending colon: Secondary | ICD-10-CM

## 2017-05-25 DIAGNOSIS — T451X5S Adverse effect of antineoplastic and immunosuppressive drugs, sequela: Secondary | ICD-10-CM | POA: Diagnosis not present

## 2017-05-25 DIAGNOSIS — E669 Obesity, unspecified: Secondary | ICD-10-CM | POA: Diagnosis not present

## 2017-05-25 DIAGNOSIS — R197 Diarrhea, unspecified: Secondary | ICD-10-CM | POA: Diagnosis not present

## 2017-05-25 DIAGNOSIS — R809 Proteinuria, unspecified: Secondary | ICD-10-CM | POA: Insufficient documentation

## 2017-05-25 DIAGNOSIS — Z90710 Acquired absence of both cervix and uterus: Secondary | ICD-10-CM | POA: Diagnosis not present

## 2017-05-25 DIAGNOSIS — I10 Essential (primary) hypertension: Secondary | ICD-10-CM | POA: Diagnosis not present

## 2017-05-25 DIAGNOSIS — K921 Melena: Secondary | ICD-10-CM | POA: Diagnosis not present

## 2017-05-25 DIAGNOSIS — Z5112 Encounter for antineoplastic immunotherapy: Secondary | ICD-10-CM | POA: Insufficient documentation

## 2017-05-25 DIAGNOSIS — Z885 Allergy status to narcotic agent status: Secondary | ICD-10-CM | POA: Diagnosis not present

## 2017-05-25 DIAGNOSIS — Z79899 Other long term (current) drug therapy: Secondary | ICD-10-CM | POA: Diagnosis not present

## 2017-05-25 DIAGNOSIS — R5381 Other malaise: Secondary | ICD-10-CM

## 2017-05-25 DIAGNOSIS — C7A8 Other malignant neuroendocrine tumors: Secondary | ICD-10-CM

## 2017-05-25 LAB — CBC WITH DIFFERENTIAL/PLATELET
Basophils Absolute: 0 10*3/uL (ref 0–0.1)
Basophils Relative: 1 %
EOS PCT: 2 %
Eosinophils Absolute: 0.1 10*3/uL (ref 0–0.7)
HCT: 38 % (ref 35.0–47.0)
Hemoglobin: 12.6 g/dL (ref 12.0–16.0)
LYMPHS ABS: 0.9 10*3/uL — AB (ref 1.0–3.6)
LYMPHS PCT: 13 %
MCH: 34 pg (ref 26.0–34.0)
MCHC: 33.2 g/dL (ref 32.0–36.0)
MCV: 102.3 fL — AB (ref 80.0–100.0)
MONO ABS: 0.5 10*3/uL (ref 0.2–0.9)
Monocytes Relative: 8 %
Neutro Abs: 5.3 10*3/uL (ref 1.4–6.5)
Neutrophils Relative %: 78 %
PLATELETS: 275 10*3/uL (ref 150–440)
RBC: 3.71 MIL/uL — ABNORMAL LOW (ref 3.80–5.20)
RDW: 16.9 % — AB (ref 11.5–14.5)
WBC: 6.8 10*3/uL (ref 3.6–11.0)

## 2017-05-25 LAB — COMPREHENSIVE METABOLIC PANEL
ALBUMIN: 3.1 g/dL — AB (ref 3.5–5.0)
ALT: 31 U/L (ref 14–54)
AST: 46 U/L — AB (ref 15–41)
Alkaline Phosphatase: 157 U/L — ABNORMAL HIGH (ref 38–126)
Anion gap: 7 (ref 5–15)
BILIRUBIN TOTAL: 0.6 mg/dL (ref 0.3–1.2)
BUN: 34 mg/dL — AB (ref 6–20)
CHLORIDE: 107 mmol/L (ref 101–111)
CO2: 22 mmol/L (ref 22–32)
Calcium: 8.9 mg/dL (ref 8.9–10.3)
Creatinine, Ser: 1.94 mg/dL — ABNORMAL HIGH (ref 0.44–1.00)
GFR calc Af Amer: 30 mL/min — ABNORMAL LOW (ref >60)
GFR calc non Af Amer: 26 mL/min — ABNORMAL LOW (ref >60)
GLUCOSE: 112 mg/dL — AB (ref 65–99)
POTASSIUM: 4.9 mmol/L (ref 3.5–5.1)
Sodium: 136 mmol/L (ref 135–145)
Total Protein: 7.5 g/dL (ref 6.5–8.1)

## 2017-05-25 LAB — MAGNESIUM: Magnesium: 1.7 mg/dL (ref 1.7–2.4)

## 2017-05-25 MED ORDER — ONDANSETRON HCL 40 MG/20ML IJ SOLN
Freq: Once | INTRAMUSCULAR | Status: AC
  Administered 2017-05-25: 12:00:00 via INTRAVENOUS
  Filled 2017-05-25: qty 4

## 2017-05-25 MED ORDER — SODIUM CHLORIDE 0.9 % IJ SOLN
10.0000 mL | INTRAMUSCULAR | Status: DC | PRN
  Administered 2017-05-25: 10 mL
  Filled 2017-05-25: qty 10

## 2017-05-25 MED ORDER — HEPARIN SOD (PORK) LOCK FLUSH 100 UNIT/ML IV SOLN
500.0000 [IU] | Freq: Once | INTRAVENOUS | Status: AC | PRN
  Administered 2017-05-25: 500 [IU]
  Filled 2017-05-25: qty 5

## 2017-05-25 MED ORDER — SODIUM CHLORIDE 0.9 % IV SOLN
Freq: Once | INTRAVENOUS | Status: AC
  Administered 2017-05-25: 12:00:00 via INTRAVENOUS
  Filled 2017-05-25: qty 1000

## 2017-05-25 NOTE — Progress Notes (Signed)
Symptom Management Consult note [facility]  Telephone:([phone] Fax:([phone]  [name], [name], MD (Internal Medicine) Bary Castilla, Forest Gleason, [name], RN as Registered Nurse [name], MD as Consulting Physician (Internal Medicine)   Name of the patient: Kathleen James  [id number]  1950/10/11   Date of visit: 05/25/17  Diagnosis- Malignant neoplasm of ascending colon [facility])  Chief complaint/ Reason for visit- Diarrhea and Rash   Heme/Onc history: # MARCH/APRIL 2017-NEUROENDOCRINE CA STAGE IV; [name]; [name]; [name]; [name]; [name]; [name]; [name]; July 14th  2017 CT- PR  # AUG 28th- FOLFIRI +Avastin; OCT 5th CT scan- PR.   # MARCH 20th- CT STABLE liver lesions; ? Enlarging cecal lesion [CEA rising]; March 26th- FOLFOX [last avastin march 12th 2018];   # resection of primary tumor [Dr.Byrnett]; y90- June 19th 2018.  # July 11th-FOLFOX + Avastin; SEP 2018 [CT- stable liver lesions; increasing ~80mm RP LN]; RISING CEA [stopped end of Sep 2018 ]  # OCT 15th 2018- Vemu+ Iri+ Pan [mid nov stopped iri- neutropenia/dia+vemu- arthralgias]; 05/11/2017-Started enco+Bini+vectibex   # FOUNDATION ONE- B-RAF V600E- MUTATED [May 2018]  # right kidney lesion 2.6 x 1.6 cm ? Angiolipoma [followed by Urology in past]  Interval history-  Patient presents today for diarrhea and rash.  Patient complains of diarrhea. Onset of diarrhea was several days ago. Diarrhea is occurring approximately 6 times per day it is approximately half a cup to one cup each occurrence. Patient describes diarrhea as bloody,  mucous-laden and watery. Diarrhea has been associated with Chemotherapy.  Patient denies fever, significant abdominal pain, nausea or vomiting.  Previous visits for diarrhea: none. Evaluation to date: none. Treatment to date: Rotating imodium and Lomotil.   Patient complains of rash involving the face, neck and scalp. Rash started a few weeks ago. Appearance of rash at onset: Color of lesion(s): brown pustular with cruingst, Texture of lesion(s): raised, Size of lesion(s): pinpoint. Rash has not changed over time Initial distribution: face, groin and neck.  Discomfort associated with rash: is pruriticAssociated symptoms: none. Denies: fever, headache, myalgia, nausea and vomiting. Patient has had previous evaluation of rash. Patient has had previous treatment.  Response to treatment: no change. She was started on minocycline at the end of November for continued rash. She has also taken of OTC steroid cream with minimal relief.  ECOG FS:0 - Asymptomatic  Review of systems- Review of Systems  Constitutional: Positive for malaise/fatigue. Negative for chills, fever and weight loss.  HENT: Negative.   Eyes: Negative.   Respiratory: Negative.   Cardiovascular: Negative.   Gastrointestinal: Positive for blood in stool and diarrhea. Negative for abdominal pain, constipation, heartburn and nausea.  Genitourinary: Negative.   Musculoskeletal: Negative.   Skin: Positive for itching and rash.  Neurological: Negative for weakness.  Endo/Heme/Allergies: Negative.      Current treatment- Cycle #4 Vectibex; continue Enco+ Bini  Allergies  Allergen Reactions  . Bee Venom Hives    Has epi-pen  . Dilaudid [Hydromorphone Hcl] Nausea And Vomiting  . Morphine And Related Nausea Only     Past Medical History:  Diagnosis Date  . Angiolipoma of kidney  followed with serial CTs ,,  Dr. Jacqlyn Larsen  . Anxiety   . Arthritis   . Cancer ([place])    colon, MIXED ADENONEUROENDOCRINE CARCINOMA INVOLVING CECUM AND  ILEOCECAL   . Chemotherapy induced diarrhea   . Hammer toe   . Hypertension   . Liver cancer ([place])   . Neuro-endocrine carcinoma ([place])   . Obesity (BMI 30-39.9)      Past Surgical History:  Procedure Laterality Date  . ABDOMINAL HYSTERECTOMY  1995   endometriosis, heavy bleeding  . BUNIONECTOMY Bilateral   . CESAREAN SECTION  1985  . COLONOSCOPY WITH PROPOFOL N/A 10/09/2015   Procedure: COLONOSCOPY WITH PROPOFOL;  Surgeon: Robert Bellow, MD;  Location: [facility] ENDOSCOPY;  Service: Endoscopy;  Laterality: N/A;  . IR ANGIOGRAM SELECTIVE EACH ADDITIONAL VESSEL  11/22/2016  . IR ANGIOGRAM SELECTIVE EACH ADDITIONAL VESSEL  11/22/2016  . IR ANGIOGRAM SELECTIVE EACH ADDITIONAL VESSEL  11/22/2016  . IR ANGIOGRAM SELECTIVE EACH ADDITIONAL VESSEL  12/07/2016  . IR ANGIOGRAM SELECTIVE EACH ADDITIONAL VESSEL  12/07/2016  . IR ANGIOGRAM VISCERAL SELECTIVE  11/22/2016  . IR ANGIOGRAM VISCERAL SELECTIVE  12/07/2016  . IR EMBO ARTERIAL NOT HEMORR HEMANG INC GUIDE ROADMAPPING  11/22/2016  . IR EMBO TUMOR ORGAN ISCHEMIA INFARCT INC GUIDE ROADMAPPING  12/07/2016  . IR GENERIC HISTORICAL  04/07/2016   IR RADIOLOGIST EVAL & MGMT 04/07/2016 Arne Cleveland, MD [facility] INTERV RAD  . IR RADIOLOGIST EVAL & MGMT  04/14/2017  . IR US GUIDE VASC ACCESS RIGHT  11/22/2016  . IR US GUIDE VASC ACCESS RIGHT  12/07/2016  . LAPAROSCOPIC RIGHT COLECTOMY Right 10/26/2016   Procedure: LAPAROSCOPIC RIGHT COLECTOMY;  Surgeon: Robert Bellow, MD;  Location: [facility] ORS;  Service: General;  Laterality: Right;  . OOPHORECTOMY    . PORTACATH PLACEMENT Left 10/01/2015   Procedure: INSERTION PORT-A-CATH;  Surgeon: Robert Bellow, MD;  Location: [facility] ORS;  Service: General;  Laterality: Left;  . ROTATOR CUFF REPAIR Right 2008   right shoulder.  [place]     Social History   Socioeconomic History  . Marital status: Married    Spouse name: Not on file  . Number of children: Not on file  . Years of education: Not on file  . Highest  education level: Not on file  Social Needs  . Financial resource strain: Not on file  . Food insecurity - worry: Not on file  . Food insecurity - inability: Not on file  . Transportation needs - medical: Not on file  . Transportation needs - non-medical: Not on file  Occupational History  . Not on file  Tobacco Use  . Smoking status: Never Smoker  . Smokeless tobacco: Never Used  Substance and Sexual Activity  . Alcohol use: Yes    Alcohol/week: 0.0 oz    Comment: occasionally  . Drug use: No  . Sexual activity: Not Currently  Other Topics Concern  . Not on file  Social History Narrative  . Not on file    Family History  Problem Relation Age of Onset  . Mental illness Mother 4       bipolar, dementia  . Cancer Maternal Aunt 70       breast ca  . Hypertension Father   . Stroke Father 68       deceased  . COPD Father   . Heart disease Sister        deceased     Current Outpatient Medications:  .  ALPRAZolam (XANAX) 0.25 MG  tablet, Take 1 tablet (0.25 mg total) by mouth 2 (two) times daily as needed for anxiety., Disp: 60 tablet, Rfl: 1 .  amLODipine (NORVASC) 5 MG tablet, Take 1 tablet (5 mg total) daily by mouth., Disp: 30 tablet, Rfl: 4 .  Binimetinib 15 MG TABS, Take 45 mg every 12 (twelve) hours by mouth., Disp: 180 tablet, Rfl: 4 .  Biotin w/ Vitamins C & E (HAIR/SKIN/NAILS PO), Take 1 tablet by mouth daily., Disp: , Rfl:  .  Chlorpheniramine Maleate (CHLOR-TABLETS PO), Take 1 tablet by mouth every evening., Disp: , Rfl:  .  clindamycin (CLINDAGEL) 1 % gel, Apply topically 2 (two) times daily. Apply to affected areas twice a day., Disp: 30 g, Rfl: 0 .  diphenoxylate-atropine (LOMOTIL) 2.5-0.025 MG tablet, TAKE 1 TABLET FOUR TIMES DAILY AS NEEDEDFOR DIARRHEA OR LOOSE STOOLS, Disp: 30 tablet, Rfl: 1 .  Encorafenib 75 MG CAPS, Take 300 mg daily by mouth., Disp: 120 capsule, Rfl: 4 .  lidocaine-prilocaine (EMLA) cream, Apply 1 application topically as needed. Apply to  port a cath site 1 hour prior to chemotherapy treatments, Disp: 30 g, Rfl: 6 .  loperamide (IMODIUM A-D) 2 MG tablet, Take 2 mg by mouth 4 (four) times daily as needed for diarrhea or loose stools., Disp: , Rfl:  .  minocycline (MINOCIN) 100 MG capsule, Take 1 capsule (100 mg total) by mouth daily. (Patient not taking: Reported on 05/16/2017), Disp: 60 capsule, Rfl: 1 .  naproxen sodium (ANAPROX) 220 MG tablet, Take 440 mg by mouth 2 (two) times daily as needed., Disp: , Rfl:  .  olmesartan (BENICAR) 40 MG tablet, Take 1 tablet (40 mg total) daily by mouth., Disp: 30 tablet, Rfl: 4 .  ondansetron (ZOFRAN) 8 MG tablet, Take 1 tablet (8 mg total) by mouth every 8 (eight) hours as needed for nausea or vomiting., Disp: 40 tablet, Rfl: 3 .  potassium chloride (K-DUR) 10 MEQ tablet, TAKE ONE TABLET BY MOUTH TWICE DAILY, Disp: 60 tablet, Rfl: 3 .  prochlorperazine (COMPAZINE) 10 MG tablet, TAKE ONE TABLET BY MOUTH EVERY 6 HOURS AS NEEDED FOR NAUSEA OR VOMITING, Disp: 30 tablet, Rfl: 2 .  triamcinolone ointment (KENALOG) 0.5 %, Apply 1 application topically 2 (two) times daily. To rash, Disp: 30 g, Rfl: 0 No current facility-administered medications for this visit.   Facility-Administered Medications Ordered in Other Visits:  .  heparin lock flush 100 unit/mL, 500 Units, Intravenous, Once, Brahmanday, Govinda R, MD .  sodium chloride flush (NS) 0.9 % injection 10 mL, 10 mL, Intravenous, PRN, [name], MD, 10 mL at 11/24/15 2836  Physical exam: There were no vitals filed for this visit. Physical Exam  Constitutional: She is oriented to person, place, and time and well-developed, well-nourished, and in no distress.  HENT:  Head: Normocephalic and atraumatic.  Eyes: Pupils are equal, round, and reactive to light.  Neck: Normal range of motion. Neck supple.  Cardiovascular: Normal rate, regular rhythm, normal heart sounds and intact distal pulses.  Pulmonary/Chest: Effort normal and breath  sounds normal.  Abdominal: Soft. Bowel sounds are normal. There is no tenderness.  Musculoskeletal: Normal range of motion.  Neurological: She is alert and oriented to person, place, and time.  Skin: Skin is warm and dry. Rash noted. Rash is pustular.     On scalp, face, neck and chest     CMP Latest Ref Rng & Units 05/25/2017  Glucose 65 - 99 mg/dL 112(H)  BUN 6 - 20 mg/dL 34(H)  Creatinine 0.44 - 1.00 mg/dL 1.94(H)  Sodium 135 - 145 mmol/L 136  Potassium 3.5 - 5.1 mmol/L 4.9  Chloride 101 - 111 mmol/L 107  CO2 22 - 32 mmol/L 22  Calcium 8.9 - 10.3 mg/dL 8.9  Total Protein 6.5 - 8.1 g/dL 7.5  Total Bilirubin 0.3 - 1.2 mg/dL 0.6  Alkaline Phos 38 - 126 U/L 157(H)  AST 15 - 41 U/L 46(H)  ALT 14 - 54 U/L 31   CBC Latest Ref Rng & Units 05/25/2017  WBC 3.6 - 11.0 K/uL 6.8  Hemoglobin 12.0 - 16.0 g/dL 12.6  Hematocrit 35.0 - 47.0 % 38.0  Platelets 150 - 440 K/uL 275    No images are attached to the encounter.  No results found.   Assessment and plan- Patient is a 66 y.o. female he presents with diarrhea and rash. Patient appears well sitting with her husband in the exam room. Vital signs are normal. She is afebrile. Labs revealed an elevated kidney function. Lungs are clear bilaterally Abdominal assessment is non tender with normoactive bowel sounds. Rash noted on scalp, face, neck and chest. Rash is papulo pustular with crusting. There are obvious scratch marks with bleeding.  1. Diarrhea: Patient appears to be rotating Lomotil and Imodium with relief. Labs reveal an elevated kidney function indicating possible dehydration. Give 1 liter NaCl today. This is directly related to her chemotherapy. Dr. Rogue Bussing notified and may need to dose reduce for cycle #4. Patient encouraged to stay hydrated. She may need additional fluid in between treatments in the future. 2. Rash: Patient is to continue minocycline as prescribed. RX Kenalog cream BID for itching. Patient may also take Benadryl  or Claritin as needed. 3. Patient is to return to clinic as scheduled to see Dr. Rogue Bussing on Monday for labs and Cycle 5 of Vectibix.    Visit Diagnosis 1. Malignant neoplasm of ascending colon ([place])   2. Diarrhea, unspecified type   3. Rash of face     Patient expressed understanding and was in agreement with this plan. She also understands that She can call clinic at any time with any questions, concerns, or complaints.   Greater than 50% was spent in counseling and coordination of care with this patient including but not limited to discussion of the relevant topics above (See A&P) including, but not limited to diagnosis and management of acute and chronic medical conditions.    Faythe Casa, AGNP-C [facility]- [phone] Pager- [phone] 05/26/2017 2:46 PM

## 2017-05-25 NOTE — Telephone Encounter (Signed)
Patient called to report that she has has severe diarrhea for several days and this morning she is passing bright red blood.  Discussed with Dr Rogue Bussing and he advises to come in to see NP. Patient accepted appointment for 1030

## 2017-05-25 NOTE — Progress Notes (Signed)
Patient here for symptom mgmt clinic. Reports severe diarrhea for several days. Pt states that this has been ongoing since this past weekend. "I should have called on Monday, but I didn't and now this morning I noticed bright red bleeding from my stool." pt denies any straining of bowel movement. Reports 3 loose stools this am. Pt states that her "bottom feels raw."

## 2017-05-26 ENCOUNTER — Other Ambulatory Visit: Payer: Self-pay | Admitting: Internal Medicine

## 2017-05-26 DIAGNOSIS — T451X5A Adverse effect of antineoplastic and immunosuppressive drugs, initial encounter: Principal | ICD-10-CM

## 2017-05-26 DIAGNOSIS — K521 Toxic gastroenteritis and colitis: Secondary | ICD-10-CM

## 2017-05-26 LAB — CEA: CEA: 132.2 ng/mL — ABNORMAL HIGH (ref 0.0–4.7)

## 2017-05-26 MED ORDER — TRIAMCINOLONE ACETONIDE 0.5 % EX OINT: 1.0000 "application " | TOPICAL_OINTMENT | Freq: Two times a day (BID) | CUTANEOUS | 0 refills | Status: AC

## 2017-05-30 ENCOUNTER — Inpatient Hospital Stay: Payer: Medicare HMO

## 2017-05-30 ENCOUNTER — Inpatient Hospital Stay: Payer: Medicare HMO | Admitting: Internal Medicine

## 2017-05-31 ENCOUNTER — Encounter: Payer: Self-pay | Admitting: Oncology

## 2017-05-31 ENCOUNTER — Other Ambulatory Visit: Payer: Self-pay

## 2017-05-31 ENCOUNTER — Inpatient Hospital Stay (HOSPITAL_BASED_OUTPATIENT_CLINIC_OR_DEPARTMENT_OTHER): Payer: Medicare HMO | Admitting: Oncology

## 2017-05-31 ENCOUNTER — Other Ambulatory Visit: Payer: Self-pay | Admitting: Internal Medicine

## 2017-05-31 ENCOUNTER — Ambulatory Visit
Admission: RE | Admit: 2017-05-31 | Discharge: 2017-05-31 | Disposition: A | Discharge status: Home / Self Care | Payer: Medicare HMO | Source: Ambulatory Visit | Attending: Oncology | Admitting: Oncology

## 2017-05-31 ENCOUNTER — Inpatient Hospital Stay: Payer: Medicare HMO

## 2017-05-31 VITALS — BP 110/78 | HR 88 | Temp 97.7°F | Resp 20 | Wt 197.2 lb

## 2017-05-31 DIAGNOSIS — R93422 Abnormal radiologic findings on diagnostic imaging of left kidney: Secondary | ICD-10-CM | POA: Insufficient documentation

## 2017-05-31 DIAGNOSIS — C182 Malignant neoplasm of ascending colon: Secondary | ICD-10-CM

## 2017-05-31 DIAGNOSIS — R944 Abnormal results of kidney function studies: Secondary | ICD-10-CM | POA: Diagnosis not present

## 2017-05-31 DIAGNOSIS — R197 Diarrhea, unspecified: Secondary | ICD-10-CM

## 2017-05-31 DIAGNOSIS — N289 Disorder of kidney and ureter, unspecified: Secondary | ICD-10-CM

## 2017-05-31 DIAGNOSIS — R799 Abnormal finding of blood chemistry, unspecified: Secondary | ICD-10-CM

## 2017-05-31 DIAGNOSIS — I10 Essential (primary) hypertension: Secondary | ICD-10-CM

## 2017-05-31 DIAGNOSIS — M1990 Unspecified osteoarthritis, unspecified site: Secondary | ICD-10-CM

## 2017-05-31 DIAGNOSIS — R970 Elevated carcinoembryonic antigen [CEA]: Secondary | ICD-10-CM

## 2017-05-31 DIAGNOSIS — Z885 Allergy status to narcotic agent status: Secondary | ICD-10-CM

## 2017-05-31 DIAGNOSIS — D649 Anemia, unspecified: Secondary | ICD-10-CM

## 2017-05-31 DIAGNOSIS — R5383 Other fatigue: Secondary | ICD-10-CM

## 2017-05-31 DIAGNOSIS — R110 Nausea: Secondary | ICD-10-CM | POA: Diagnosis not present

## 2017-05-31 DIAGNOSIS — E669 Obesity, unspecified: Secondary | ICD-10-CM

## 2017-05-31 DIAGNOSIS — Z79899 Other long term (current) drug therapy: Secondary | ICD-10-CM

## 2017-05-31 DIAGNOSIS — R809 Proteinuria, unspecified: Secondary | ICD-10-CM | POA: Diagnosis not present

## 2017-05-31 DIAGNOSIS — K521 Toxic gastroenteritis and colitis: Secondary | ICD-10-CM

## 2017-05-31 DIAGNOSIS — T451X5A Adverse effect of antineoplastic and immunosuppressive drugs, initial encounter: Secondary | ICD-10-CM

## 2017-05-31 DIAGNOSIS — R21 Rash and other nonspecific skin eruption: Secondary | ICD-10-CM | POA: Diagnosis not present

## 2017-05-31 DIAGNOSIS — G620 Drug-induced polyneuropathy: Secondary | ICD-10-CM

## 2017-05-31 DIAGNOSIS — D3002 Benign neoplasm of left kidney: Secondary | ICD-10-CM | POA: Diagnosis not present

## 2017-05-31 DIAGNOSIS — E860 Dehydration: Secondary | ICD-10-CM

## 2017-05-31 DIAGNOSIS — C787 Secondary malignant neoplasm of liver and intrahepatic bile duct: Secondary | ICD-10-CM

## 2017-05-31 DIAGNOSIS — C799 Secondary malignant neoplasm of unspecified site: Secondary | ICD-10-CM

## 2017-05-31 DIAGNOSIS — T451X5S Adverse effect of antineoplastic and immunosuppressive drugs, sequela: Secondary | ICD-10-CM

## 2017-05-31 DIAGNOSIS — Z5112 Encounter for antineoplastic immunotherapy: Secondary | ICD-10-CM | POA: Diagnosis not present

## 2017-05-31 DIAGNOSIS — C189 Malignant neoplasm of colon, unspecified: Secondary | ICD-10-CM | POA: Diagnosis not present

## 2017-05-31 DIAGNOSIS — R5381 Other malaise: Secondary | ICD-10-CM

## 2017-05-31 DIAGNOSIS — N179 Acute kidney failure, unspecified: Secondary | ICD-10-CM | POA: Diagnosis not present

## 2017-05-31 DIAGNOSIS — L270 Generalized skin eruption due to drugs and medicaments taken internally: Secondary | ICD-10-CM | POA: Diagnosis not present

## 2017-05-31 DIAGNOSIS — Z90710 Acquired absence of both cervix and uterus: Secondary | ICD-10-CM

## 2017-05-31 LAB — URINALYSIS, COMPLETE (UACMP) WITH MICROSCOPIC
BACTERIA UA: NONE SEEN
Bilirubin Urine: NEGATIVE
Glucose, UA: NEGATIVE mg/dL
Ketones, ur: NEGATIVE mg/dL
Leukocytes, UA: NEGATIVE
NITRITE: NEGATIVE
Protein, ur: 100 mg/dL — AB
SPECIFIC GRAVITY, URINE: 1.016 (ref 1.005–1.030)
pH: 5 (ref 5.0–8.0)

## 2017-05-31 LAB — COMPREHENSIVE METABOLIC PANEL
ALK PHOS: 148 U/L — AB (ref 38–126)
ALT: 27 U/L (ref 14–54)
AST: 40 U/L (ref 15–41)
Albumin: 2.6 g/dL — ABNORMAL LOW (ref 3.5–5.0)
Anion gap: 7 (ref 5–15)
BUN: 30 mg/dL — ABNORMAL HIGH (ref 6–20)
CALCIUM: 8.3 mg/dL — AB (ref 8.9–10.3)
CO2: 22 mmol/L (ref 22–32)
CREATININE: 1.83 mg/dL — AB (ref 0.44–1.00)
Chloride: 106 mmol/L (ref 101–111)
GFR, EST AFRICAN AMERICAN: 32 mL/min — AB (ref >60)
GFR, EST NON AFRICAN AMERICAN: 28 mL/min — AB (ref >60)
Glucose, Bld: 108 mg/dL — ABNORMAL HIGH (ref 65–99)
Potassium: 4.7 mmol/L (ref 3.5–5.1)
SODIUM: 135 mmol/L (ref 135–145)
Total Bilirubin: 0.4 mg/dL (ref 0.3–1.2)
Total Protein: 6.8 g/dL (ref 6.5–8.1)

## 2017-05-31 LAB — CBC WITH DIFFERENTIAL/PLATELET
Basophils Absolute: 0.1 10*3/uL (ref 0–0.1)
Basophils Relative: 1 %
EOS ABS: 0.2 10*3/uL (ref 0–0.7)
EOS PCT: 2 %
HCT: 35.5 % (ref 35.0–47.0)
HEMOGLOBIN: 11.9 g/dL — AB (ref 12.0–16.0)
LYMPHS ABS: 1.5 10*3/uL (ref 1.0–3.6)
LYMPHS PCT: 17 %
MCH: 33.7 pg (ref 26.0–34.0)
MCHC: 33.4 g/dL (ref 32.0–36.0)
MCV: 100.7 fL — AB (ref 80.0–100.0)
MONOS PCT: 5 %
Monocytes Absolute: 0.4 10*3/uL (ref 0.2–0.9)
Neutro Abs: 6.6 10*3/uL — ABNORMAL HIGH (ref 1.4–6.5)
Neutrophils Relative %: 75 %
PLATELETS: 295 10*3/uL (ref 150–440)
RBC: 3.53 MIL/uL — AB (ref 3.80–5.20)
RDW: 17.4 % — ABNORMAL HIGH (ref 11.5–14.5)
WBC: 8.8 10*3/uL (ref 3.6–11.0)

## 2017-05-31 LAB — CK: CK TOTAL: 149 U/L (ref 38–234)

## 2017-05-31 MED ORDER — SODIUM CHLORIDE 0.9 % IV SOLN
Freq: Once | INTRAVENOUS | Status: DC
Start: 2017-05-31 — End: 2017-05-31

## 2017-05-31 MED ORDER — HEPARIN SOD (PORK) LOCK FLUSH 100 UNIT/ML IV SOLN
500.0000 [IU] | Freq: Once | INTRAVENOUS | Status: AC
  Administered 2017-05-31: 500 [IU] via INTRAVENOUS
  Filled 2017-05-31: qty 5

## 2017-05-31 MED ORDER — SODIUM CHLORIDE 0.9 % IV SOLN
INTRAVENOUS | Status: DC
Start: 2017-05-31 — End: 2017-05-31
  Administered 2017-05-31: 14:00:00 via INTRAVENOUS
  Filled 2017-05-31 (×2): qty 1000

## 2017-05-31 MED ORDER — ONDANSETRON HCL 4 MG/2ML IJ SOLN
8.0000 mg | Freq: Once | INTRAMUSCULAR | Status: AC
  Administered 2017-05-31: 8 mg via INTRAVENOUS
  Filled 2017-05-31: qty 4

## 2017-05-31 NOTE — Progress Notes (Signed)
Creat- not improving; still having diarrhea; question etiology. ?  Prerenal versus sec to Enco.  recommend holding vectibix today.  Check UA; CK; kidney ultrasound.  Continue enco for now.  IV fluids today.  Follow-up with me in 1 week/labs/infusion.

## 2017-05-31 NOTE — Progress Notes (Signed)
[facility] NOTE  [name], [name], MD (Internal Medicine) Bary Castilla, Forest Gleason, [name], RN as Registered Nurse [name], MD as Consulting Physician (Internal Medicine)  CHIEF COMPLAINTS/PURPOSE OF CONSULTATION:   Oncology History   # MARCH/APRIL 2017-NEUROENDOCRINE CA STAGE IV; [name]; [name]; [name]; [name]; [name]; [name]; [name]; July 14th  2017 CT- PR  # AUG 28th- FOLFIRI +Avastin; OCT 5th CT scan- PR.   # MARCH 20th- CT STABLE liver lesions; ? Enlarging cecal lesion [CEA rising]; March 26th- FOLFOX [last avastin march 12th 2018];   # resection of primary tumor [Dr.Byrnett]; y90- June 19th 2018.  # July 11th-FOLFOX + Avastin; SEP 2018 [CT- stable liver lesions; increasing ~36mm RP LN]; RISING CEA [stopped end of Sep 2018 ]  # OCT 15th 2018- Vemu+ Iri+ Pan [mid nov stopped iri- neutropenia/dia+vemu- arthralgias]; 05/11/2017-Started enco+Bini+vectibex   # [facility]- B-RAF [id number]- MUTATED [May 2018]  # right kidney lesion 2.6 x 1.6 cm ? Angiolipoma [followed by Urology in past]     Malignant neoplasm of ascending colon ([place])     HISTORY OF PRESENTING ILLNESS:  Kathleen James 66 y.o.  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  Patient was seen in symptom management where she received 1 liter of fluids due to dehydration. She was noted to have an elevated kidney function. She had been having 5-6 episodes of diarrhea per day for the past several days. She continued to rotate Imodium and Lomotil with come relief.  Additionally she complained of worsening skin rash on face, chest and scalp. She continued her minocycline and OTC steroid cream with some relief. She was additionally given a prescription for triamcinolone cream. She was also encouraged to take Benadryl or Claritin as needed for itching.   Today patient presents for cycle 5 of Vectibix and continuation of oral chemo. Her rash has improved slightly on the front of her face but worsened on her scalp and neck. She continues to take her minocycline and use her prescriptive triamcinolone cream. She notes worsening peripheral neuropathy in hands and feet, worse on the sides of her feet. This is been progressive over the past 3 days. Additionally she complains of nausea typically around 3 PM daily. Her daughter notes that she has not been drinking fluids because she forgets. Her appetite has been okay.   ROS: A complete 10 point review of system is done which is negative except mentioned above in history of present illness  MEDICAL HISTORY:  Past Medical History:  Diagnosis Date  . Angiolipoma of kidney    followed with serial CTs ,,  Dr. Jacqlyn Larsen  . Anxiety   . Arthritis   . 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 Chemotherapy induced diarrhea   . Hammer toe   . Hypertension   . Liver cancer ([employer])   . Neuro-endocrine carcinoma ([place])   . Obesity (BMI 30-39.9)     SURGICAL HISTORY: Past Surgical History:  Procedure Laterality Date  . ABDOMINAL HYSTERECTOMY  1995   endometriosis, heavy bleeding  .  BUNIONECTOMY Bilateral   . CESAREAN SECTION  1985  . COLONOSCOPY WITH PROPOFOL N/A 10/09/2015   Procedure: COLONOSCOPY WITH PROPOFOL;  Surgeon: Robert Bellow, MD;  Location: [facility] END[facility]COPY;  Service: Endoscopy;  Laterality: N/A;  . IR ANGIOGRAM SELECTIVE EACH ADDITIONAL VESSEL  11/22/2016  . IR ANGIOGRAM SELECTIVE EACH ADDITIONAL VESSEL  11/22/2016  . IR ANGIOGRAM SELECTIVE EACH ADDITIONAL VESSEL   11/22/2016  . IR ANGIOGRAM SELECTIVE EACH ADDITIONAL VESSEL  12/07/2016  . IR ANGIOGRAM SELECTIVE EACH ADDITIONAL VESSEL  12/07/2016  . IR ANGIOGRAM VISCERAL SELECTIVE  11/22/2016  . IR ANGIOGRAM VISCERAL SELECTIVE  12/07/2016  . IR EMBO ARTERIAL NOT HEMORR HEMANG INC GUIDE ROADMAPPING  11/22/2016  . IR EMBO TUMOR ORGAN ISCHEMIA INFARCT INC GUIDE ROADMAPPING  12/07/2016  . IR GENERIC HISTORICAL  04/07/2016   IR RADIOLOGIST EVAL & MGMT 04/07/2016 Arne Cleveland, MD [facility] INTERV RAD  . IR RADIOLOGIST EVAL & MGMT  04/14/2017  . IR US GUIDE VASC ACCESS RIGHT  11/22/2016  . IR US GUIDE VASC ACCESS RIGHT  12/07/2016  . LAPAROSCOPIC RIGHT COLECTOMY Right 10/26/2016   Procedure: LAPAROSCOPIC RIGHT COLECTOMY;  Surgeon: Robert Bellow, MD;  Location: [facility] ORS;  Service: General;  Laterality: Right;  . OOPHORECTOMY    . PORTACATH PLACEMENT Left 10/01/2015   Procedure: INSERTION PORT-A-CATH;  Surgeon: Robert Bellow, MD;  Location: [facility] ORS;  Service: General;  Laterality: Left;  . ROTATOR CUFF REPAIR Right 2008   right shoulder.  [place]     SOCIAL HISTORY: She lives at home with her family in [place]. 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 Social History   Socioeconomic History  . Marital status: Married    Spouse name: Not on file  . Number of children: Not on file  . Years of education: Not on file  . Highest education level: Not on file  Social Needs  . Financial resource strain: Not on file  . Food insecurity - worry: Not on file  . Food insecurity - inability: Not on file  . Transportation needs - medical: Not on file  . Transportation needs - non-medical: Not on file  Occupational History  . Not on file  Tobacco Use  . Smoking status: Never Smoker  . Smokeless tobacco: Never Used  Substance and Sexual Activity  . Alcohol use: Yes    Alcohol/week: 0.0 oz    Comment: occasionally  . Drug use: No  . Sexual activity: Not Currently  Other  Topics Concern  . Not on file  Social History Narrative  . Not on file    FAMILY HISTORY: Family History  Problem Relation Age of Onset  . Mental illness Mother 3       bipolar, dementia  . Cancer Maternal Aunt 70       breast ca  . Hypertension Father   . Stroke Father 86       deceased  . COPD Father   . Heart disease Sister        deceased    ALLERGIES:  is allergic to bee venom; dilaudid [hydromorphone hcl]; and morphine and related.  MEDICATIONS:  Current Outpatient Medications  Medication Sig Dispense Refill  . ALPRAZolam (XANAX) 0.25 MG tablet Take 1 tablet (0.25 mg total) by mouth 2 (two) times daily as needed for anxiety. 60 tablet 1  . amLODipine (NORVASC) 5 MG tablet Take 1 tablet (5 mg total) daily by mouth. 30 tablet 4  .  Binimetinib 15 MG TABS Take 45 mg every 12 (twelve) hours by mouth. 180 tablet 4  . Biotin w/ Vitamins C & E (HAIR/SKIN/NAILS PO) Take 1 tablet by mouth daily.    . Chlorpheniramine Maleate (CHLOR-TABLETS PO) Take 1 tablet by mouth every evening.    . clindamycin (CLINDAGEL) 1 % gel Apply topically 2 (two) times daily. Apply to affected areas twice a day. 30 g 0  . diphenoxylate-atropine (LOMOTIL) 2.5-0.025 MG tablet TAKE 1 TABLET FOUR TIMES DAILY AS NEEDEDFOR DIARRHEA OR LOOSE STOOLS 30 tablet 1  . Encorafenib 75 MG CAPS Take 300 mg daily by mouth. 120 capsule 4  . lidocaine-prilocaine (EMLA) cream Apply 1 application topically as needed. Apply to port a cath site 1 hour prior to chemotherapy treatments 30 g 6  . loperamide (IMODIUM A-D) 2 MG tablet Take 2 mg by mouth 4 (four) times daily as needed for diarrhea or loose stools.    . naproxen sodium (ANAPROX) 220 MG tablet Take 440 mg by mouth 2 (two) times daily as needed.    Marland Kitchen olmesartan (BENICAR) 40 MG tablet Take 1 tablet (40 mg total) daily by mouth. 30 tablet 4  . ondansetron (ZOFRAN) 8 MG tablet Take 1 tablet (8 mg total) by mouth every 8 (eight) hours as needed for nausea or vomiting. 40  tablet 3  . potassium chloride (K-DUR) 10 MEQ tablet TAKE ONE TABLET BY MOUTH TWICE DAILY 60 tablet 3  . prochlorperazine (COMPAZINE) 10 MG tablet TAKE ONE TABLET BY MOUTH EVERY 6 HOURS AS NEEDED FOR NAUSEA OR VOMITING 30 tablet 2  . triamcinolone ointment (KENALOG) 0.5 % Apply 1 application topically 2 (two) times daily. To rash 30 g 0  . minocycline (MINOCIN) 100 MG capsule Take 1 capsule (100 mg total) by mouth daily. (Patient not taking: Reported on 05/16/2017) 60 capsule 1   No current facility-administered medications for this visit.    Facility-Administered Medications Ordered in Other Visits  Medication Dose Route Frequency Provider Last Rate Last Dose  . heparin lock flush 100 unit/mL  500 Units Intravenous Once Charlaine Dalton R, MD      . sodium chloride flush (NS) 0.9 % injection 10 mL  10 mL Intravenous PRN [name], MD   10 mL at 11/24/15 0836      .  PHYSICAL EXAMINATION: ECOG PERFORMANCE STATUS: 0 - Asymptomatic  Vitals:   05/31/17 1300  BP: 110/78  Pulse: 88  Resp: 20  Temp: 97.7 F (36.5 C)   Filed Weights   05/31/17 1300  Weight: 197 lb 3.2 oz (89.4 kg)    GENERAL: Well-nourished well-developed; Alert, no distress and comfortable. Accompanied by her daughter.  EYES: Patient is pale. no icterus OROPHARYNX: Dry mucous membranes, no thrush or ulceration; good dentition  NECK: supple, no masses felt LYMPH:  no palpable lymphadenopathy in the cervical, axillary or inguinal regions LUNGS: clear to auscultation and  No wheeze or crackles HEART/CVS: regular rate & rhythm and no murmurs; No lower extremity edema ABDOMEN: abdomen soft, non-tender and normal bowel sounds; positive for hepatomegaly. Musculoskeletal:no cyanosis of digits and no clubbing  PSYCH: alert & oriented x 3 with fluent speech NEURO: numbness and tingling in fingertips and toes. no focal motor deficits SKIN:  Acne-like rash noted on the torso/face/scalp.  LABORATORY DATA:   I have reviewed the data as listed Lab Results  Component Value Date   WBC 8.8 05/31/2017   HGB 11.9 (L) 05/31/2017   HCT 35.5 05/31/2017  MCV 100.7 (H) 05/31/2017   PLT 295 05/31/2017   Recent Labs    05/16/17 0853 05/25/17 1050 05/31/17 1220  NA 135 136 135  K 4.1 4.9 4.7  CL 106 107 106  CO2 23 22 22   GLUCOSE 86 112* 108*  BUN 13 34* 30*  CREATININE 0.99 1.94* 1.83*  CALCIUM 8.7* 8.9 8.3*  GFRNONAA 58* 26* 28*  GFRAA >60 30* 32*  PROT 7.3 7.5 6.8  ALBUMIN 3.1* 3.1* 2.6*  AST 43* 46* 40  ALT 28 31 27   ALKPHOS 146* 157* 148*  BILITOT 0.6 0.6 0.4   Results for ALEINA, BURGIO (MRN [id number]) as of 05/16/2017 09:33  Ref. Range 03/14/2017 11:30 03/23/2017 09:40 04/18/2017 10:15 04/25/2017 13:00 05/02/2017 08:25  CEA Latest Ref Range: 0.0 - 4.7 ng/mL 529.1 (H) 537.2 (H) 397.9 (H) 263.6 (H) 155.8 (H)    RADIOGRAPHIC STUDIES: I have personally reviewed the radiological images as listed and agreed with the findings in the report. US Renal  Result Date: 05/31/2017 CLINICAL DATA:  Colon carcinoma with elevated renal function EXAM: RENAL / URINARY TRACT ULTRASOUND COMPLETE COMPARISON:  CT abdomen and pelvis March 30, 2017 FINDINGS: Right Kidney: Length: 10.4 cm Echogenicity and renal cortical thickness are within normal limits. No mass, perinephric fluid, or hydronephrosis visualized. No sonographically demonstrable calculus or ureterectasis. Left Kidney: Length: 11.6 cm. Echogenicity and renal cortical thickness are within normal limits. No perinephric fluid or hydronephrosis visualized. 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 No other mass evident. No sonographically demonstrable calculus or ureterectasis. Bladder: Appears normal for degree of bladder distention. 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 Study otherwise unremarkable.  Electronically Signed   By: Lowella Grip III M.D.   On: 05/31/2017 15:59      ASSESSMENT & PLAN: On initial assessment patient appears well. She is sitting comfortably  in exam chair with her daughter. Vital signs are stable. She is afebrile. Labs reveal a persistent elevated kidney function. Kidney functions slightly better than last week when she received IV fluids but not considerable. Patient is also mildly anemic (11.9). Lungs are clear. Heart sounds normal.Bowel sounds hyperactive. Numbness in tips of fingers and toes. 5/10 pain on lateral aspect of bilateral feet. Numbness in fingertips bilaterally.   1. Consulted with Dr. Rogue Bussing and he would like to hold treatment today and gave her 1 L normal saline due to persistent elevated kidney function. We will also get a urinalysis, CK and renal ultrasound bilateral kidneys. UA revealed some proteinuria (100) and small amount of blood, normal CK and normal ultrasound of kidneys. According to up-to-date Binimetinib and Encorafenib has an AE of increased serum creatinine (93%). Encorafenib has an AE of PN (12%).  2. Nausea: Give IV Zofran 8 mg during infusion. Patient is to continue her antiemetics at home. Encouraged her to take antiemetic in the afternoon.  3. Grade 2 Peripheral Neuropathy: Consulted Dr. Rogue Bussing and patient is to continue oral chemotherapy medications at this time. This will be reevaluated by Dr. Rogue Bussing. Explained to patient if this worsens to let us know. Encorafenib has an AE of PN (12%).  4. Diarrhea: Continue to rotate Lomotil and Imodium. 5. Rash: Continue minocycline and triamcinolone cream as needed. 6. RTC next week for reconsideration of Vectibex. CEA today is  87.9. Patient informed of all results.    Wandra Feinstein  Shanell Aden, NP 06/01/2017 9:41 AM

## 2017-05-31 NOTE — Progress Notes (Signed)
Patient reports worsening neuropathy in hands and feet.

## 2017-06-01 ENCOUNTER — Telehealth: Payer: Self-pay | Admitting: *Deleted

## 2017-06-01 LAB — CEA: CEA: 87.9 ng/mL — ABNORMAL HIGH (ref 0.0–4.7)

## 2017-06-01 NOTE — Telephone Encounter (Signed)
Due to inclement weather on 05/30/17 pt's chemotherapy apt was r/s. Infusion able to accomodate pt on 12/11 with NP.  pt came to chemotherapy apt as requested and received chemotherapy tx. Pt expressed "concerns about not receiving a follow-up call regarding her new chemotherapy apt time within a timely fashion."  RN contacted pt to follow-up on specific concerns to follow-up on the patient's experience.  I personally contacted pt to apologize for the delays in her care.  pt stated, "On Sunday, someone from the clinic called me and notified me that my apt on Monday would be cnl due to the weather and I would be r/s for Tuesday. She explained that I would receive a phone call back as soon as possible to let her know what time to come on Tuesday and I never did.  Colette finally called me before 12 noon on Tuesday morning to have me come into as soon as possible to see the NP and get started my treatment, but I didn't get notified until then. It was ok. I did have to rush to get there, but I did get my chemotherapy which matters most."  pt expressed gratitude on follow-up patient experience phone call. Actively listening provided.

## 2017-06-06 ENCOUNTER — Other Ambulatory Visit: Payer: Self-pay

## 2017-06-06 ENCOUNTER — Inpatient Hospital Stay (HOSPITAL_BASED_OUTPATIENT_CLINIC_OR_DEPARTMENT_OTHER): Payer: Medicare HMO | Admitting: Internal Medicine

## 2017-06-06 ENCOUNTER — Inpatient Hospital Stay: Payer: Medicare HMO

## 2017-06-06 ENCOUNTER — Encounter: Payer: Self-pay | Admitting: Internal Medicine

## 2017-06-06 VITALS — BP 135/87 | HR 83 | Temp 97.8°F | Resp 20 | Ht 65.0 in | Wt 202.0 lb

## 2017-06-06 DIAGNOSIS — T451X5S Adverse effect of antineoplastic and immunosuppressive drugs, sequela: Secondary | ICD-10-CM

## 2017-06-06 DIAGNOSIS — R197 Diarrhea, unspecified: Secondary | ICD-10-CM

## 2017-06-06 DIAGNOSIS — C182 Malignant neoplasm of ascending colon: Secondary | ICD-10-CM

## 2017-06-06 DIAGNOSIS — L270 Generalized skin eruption due to drugs and medicaments taken internally: Secondary | ICD-10-CM

## 2017-06-06 DIAGNOSIS — R801 Persistent proteinuria, unspecified: Secondary | ICD-10-CM

## 2017-06-06 DIAGNOSIS — R809 Proteinuria, unspecified: Secondary | ICD-10-CM

## 2017-06-06 DIAGNOSIS — Z79899 Other long term (current) drug therapy: Secondary | ICD-10-CM | POA: Diagnosis not present

## 2017-06-06 DIAGNOSIS — G620 Drug-induced polyneuropathy: Secondary | ICD-10-CM

## 2017-06-06 DIAGNOSIS — E669 Obesity, unspecified: Secondary | ICD-10-CM

## 2017-06-06 DIAGNOSIS — M1990 Unspecified osteoarthritis, unspecified site: Secondary | ICD-10-CM | POA: Diagnosis not present

## 2017-06-06 DIAGNOSIS — C787 Secondary malignant neoplasm of liver and intrahepatic bile duct: Secondary | ICD-10-CM

## 2017-06-06 DIAGNOSIS — Z885 Allergy status to narcotic agent status: Secondary | ICD-10-CM

## 2017-06-06 DIAGNOSIS — I10 Essential (primary) hypertension: Secondary | ICD-10-CM

## 2017-06-06 DIAGNOSIS — R110 Nausea: Secondary | ICD-10-CM | POA: Diagnosis not present

## 2017-06-06 DIAGNOSIS — N179 Acute kidney failure, unspecified: Secondary | ICD-10-CM | POA: Diagnosis not present

## 2017-06-06 DIAGNOSIS — D649 Anemia, unspecified: Secondary | ICD-10-CM | POA: Diagnosis not present

## 2017-06-06 DIAGNOSIS — R944 Abnormal results of kidney function studies: Secondary | ICD-10-CM | POA: Diagnosis not present

## 2017-06-06 DIAGNOSIS — Z90710 Acquired absence of both cervix and uterus: Secondary | ICD-10-CM

## 2017-06-06 DIAGNOSIS — Z5112 Encounter for antineoplastic immunotherapy: Secondary | ICD-10-CM | POA: Diagnosis not present

## 2017-06-06 LAB — COMPREHENSIVE METABOLIC PANEL
ALK PHOS: 132 U/L — AB (ref 38–126)
ALT: 27 U/L (ref 14–54)
AST: 40 U/L (ref 15–41)
Albumin: 2.5 g/dL — ABNORMAL LOW (ref 3.5–5.0)
Anion gap: 5 (ref 5–15)
BUN: 34 mg/dL — ABNORMAL HIGH (ref 6–20)
CHLORIDE: 108 mmol/L (ref 101–111)
CO2: 22 mmol/L (ref 22–32)
Calcium: 8.1 mg/dL — ABNORMAL LOW (ref 8.9–10.3)
Creatinine, Ser: 1.75 mg/dL — ABNORMAL HIGH (ref 0.44–1.00)
GFR, EST AFRICAN AMERICAN: 34 mL/min — AB (ref >60)
GFR, EST NON AFRICAN AMERICAN: 29 mL/min — AB (ref >60)
Glucose, Bld: 98 mg/dL (ref 65–99)
Potassium: 4.6 mmol/L (ref 3.5–5.1)
Sodium: 135 mmol/L (ref 135–145)
TOTAL PROTEIN: 6.6 g/dL (ref 6.5–8.1)
Total Bilirubin: 0.3 mg/dL (ref 0.3–1.2)

## 2017-06-06 LAB — MAGNESIUM: Magnesium: 1.5 mg/dL — ABNORMAL LOW (ref 1.7–2.4)

## 2017-06-06 MED ORDER — MAGNESIUM SULFATE 2 GM/50ML IV SOLN
2.0000 g | Freq: Once | INTRAVENOUS | Status: AC
  Administered 2017-06-06: 2 g via INTRAVENOUS
  Filled 2017-06-06: qty 50

## 2017-06-06 MED ORDER — METOPROLOL TARTRATE 25 MG PO TABS: 25.0000 mg | ORAL_TABLET | Freq: Two times a day (BID) | ORAL | 3 refills | Status: DC

## 2017-06-06 MED ORDER — HEPARIN SOD (PORK) LOCK FLUSH 100 UNIT/ML IV SOLN: 500.0000 [IU] | Freq: Once | INTRAVENOUS | Status: DC | PRN

## 2017-06-06 MED ORDER — SODIUM CHLORIDE 0.9% FLUSH
10.0000 mL | INTRAVENOUS | Status: DC | PRN
  Administered 2017-06-06: 10 mL via INTRAVENOUS
  Filled 2017-06-06: qty 10

## 2017-06-06 MED ORDER — PANITUMUMAB CHEMO INJECTION 100 MG/5ML
6.0000 mg/kg | Freq: Once | INTRAVENOUS | Status: AC
  Administered 2017-06-06: 560 mg via INTRAVENOUS
  Filled 2017-06-06: qty 10

## 2017-06-06 MED ORDER — HEPARIN SOD (PORK) LOCK FLUSH 100 UNIT/ML IV SOLN
500.0000 [IU] | Freq: Once | INTRAVENOUS | Status: AC
  Administered 2017-06-06: 500 [IU] via INTRAVENOUS
  Filled 2017-06-06: qty 5

## 2017-06-06 MED ORDER — SODIUM CHLORIDE 0.9 % IV SOLN
Freq: Once | INTRAVENOUS | Status: AC
  Administered 2017-06-06: 10:00:00 via INTRAVENOUS
  Filled 2017-06-06: qty 1000

## 2017-06-06 NOTE — Progress Notes (Signed)
[place] NOTE  [name], [name], MD (Internal Medicine) Bary Castilla, Forest Gleason, [name], RN as Registered Nurse [name], MD as Consulting Physician (Internal Medicine)  CHIEF COMPLAINTS/PURPOSE OF CONSULTATION:   Oncology History   # MARCH/APRIL 2017-NEUROENDOCRINE CA STAGE IV; [name]; [name]; [name]; [name]; [name]; [name]; [name]; July 14th  2017 CT- PR  # AUG 28th- FOLFIRI +Avastin; OCT 5th CT scan- PR.   # MARCH 20th- CT STABLE liver lesions; ? Enlarging cecal lesion [CEA rising]; March 26th- FOLFOX [last avastin march 12th 2018];   # resection of primary tumor [Dr.Byrnett]; y90- June 19th 2018.  # July 11th-FOLFOX + Avastin; SEP 2018 [CT- stable liver lesions; increasing ~69mm RP LN]; RISING CEA [stopped end of Sep 2018 ]  # OCT 15th 2018- Vemu+ Iri+ Pan [mid nov stopped iri- neutropenia/dia+vemu- arthralgias]; 05/11/2017-Started enco+Bini+vectibex   # [facility]- B-RAF [id number]- MUTATED [May 2018]  # right kidney lesion 2.6 x 1.6 cm ? Angiolipoma [followed by Urology in past]     Malignant neoplasm of ascending colon ([place])     HISTORY OF PRESENTING ILLNESS:  Kathleen James 66 y.o.  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 Chemo held last week because of acute renal failure.  Patient's diarrhea has improved.  Skin rash is improved.  She has been taking the fluids.  Denies any shortness of breath or chest pain.  Mild fatigue.  No swelling in the legs.  ROS: A complete 10 point review of  system is done which is negative except mentioned above in history of present illness  MEDICAL HISTORY:  Past Medical History:  Diagnosis Date  . Angiolipoma of kidney    followed with serial CTs ,,  Dr. Jacqlyn Larsen  . Anxiety   . Arthritis   . 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 Chemotherapy induced diarrhea   . Hammer toe   . Hypertension   . Liver cancer ([place])   . Neuro-endocrine carcinoma ([place])   . Obesity (BMI 30-39.9)     SURGICAL HISTORY: Past Surgical History:  Procedure Laterality Date  . ABDOMINAL HYSTERECTOMY  1995   endometriosis, heavy bleeding  . BUNIONECTOMY Bilateral   . CESAREAN SECTION  1985  . COLONOSCOPY WITH PROPOFOL N/A 10/09/2015   Procedure: COLONOSCOPY WITH PROPOFOL;  Surgeon: Robert Bellow, MD;  Location: [facility] ENDOSCOPY;  Service: Endoscopy;  Laterality: N/A;  . IR ANGIOGRAM SELECTIVE EACH ADDITIONAL VESSEL  11/22/2016  . IR ANGIOGRAM SELECTIVE EACH ADDITIONAL VESSEL  11/22/2016  . IR ANGIOGRAM SELECTIVE EACH ADDITIONAL VESSEL  11/22/2016  . IR ANGIOGRAM SELECTIVE EACH ADDITIONAL VESSEL  12/07/2016  . IR ANGIOGRAM SELECTIVE EACH ADDITIONAL VESSEL  12/07/2016  . IR ANGIOGRAM VISCERAL SELECTIVE  11/22/2016  . IR ANGIOGRAM VISCERAL SELECTIVE  12/07/2016  . IR EMBO ARTERIAL NOT HEMORR HEMANG INC GUIDE ROADMAPPING  11/22/2016  . IR EMBO TUMOR ORGAN ISCHEMIA INFARCT INC GUIDE ROADMAPPING  12/07/2016  . IR GENERIC HISTORICAL  04/07/2016   IR RADIOLOGIST EVAL & MGMT 04/07/2016 Quillian Quince  Vernard Gambles, MD [facility] INTERV RAD  . IR RADIOLOGIST EVAL & MGMT  04/14/2017  . IR US GUIDE VASC ACCESS RIGHT  11/22/2016  . IR US GUIDE VASC ACCESS RIGHT  12/07/2016  . LAPAROSCOPIC RIGHT COLECTOMY Right 10/26/2016   Procedure: LAPAROSCOPIC RIGHT COLECTOMY;  Surgeon: Robert Bellow, MD;  Location: [facility] ORS;  Service: General;  Laterality: Right;  . OOPHORECTOMY    . PORTACATH PLACEMENT Left 10/01/2015   Procedure: INSERTION PORT-A-CATH;   Surgeon: Robert Bellow, MD;  Location: [facility] ORS;  Service: General;  Laterality: Left;  . ROTATOR CUFF REPAIR Right 2008   right shoulder.  [place]     SOCIAL HISTORY: She lives at home with her family in [place]. 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 Social History   Socioeconomic History  . Marital status: Married    Spouse name: Not on file  . Number of children: Not on file  . Years of education: Not on file  . Highest education level: Not on file  Social Needs  . Financial resource strain: Not on file  . Food insecurity - worry: Not on file  . Food insecurity - inability: Not on file  . Transportation needs - medical: Not on file  . Transportation needs - non-medical: Not on file  Occupational History  . Not on file  Tobacco Use  . Smoking status: Never Smoker  . Smokeless tobacco: Never Used  Substance and Sexual Activity  . Alcohol use: Yes    Alcohol/week: 0.0 oz    Comment: occasionally  . Drug use: No  . Sexual activity: Not Currently  Other Topics Concern  . Not on file  Social History Narrative  . Not on file    FAMILY HISTORY: Family History  Problem Relation Age of Onset  . Mental illness Mother 29       bipolar, dementia  . Cancer Maternal Aunt 70       breast ca  . Hypertension Father   . Stroke Father 71       deceased  . COPD Father   . Heart disease Sister        deceased    ALLERGIES:  is allergic to bee venom; dilaudid [hydromorphone hcl]; and morphine and related.  MEDICATIONS:  Current Outpatient Medications  Medication Sig Dispense Refill  . ALPRAZolam (XANAX) 0.25 MG tablet Take 1 tablet (0.25 mg total) by mouth 2 (two) times daily as needed for anxiety. 60 tablet 1  . amLODipine (NORVASC) 5 MG tablet Take 1 tablet (5 mg total) daily by mouth. 30 tablet 4  . Binimetinib 15 MG TABS Take 45 mg every 12 (twelve) hours by mouth. 180 tablet 4  . Biotin w/ Vitamins C & E (HAIR/SKIN/NAILS  PO) Take 1 tablet by mouth daily.    . Chlorpheniramine Maleate (CHLOR-TABLETS PO) Take 1 tablet by mouth every evening.    . clindamycin (CLINDAGEL) 1 % gel Apply topically 2 (two) times daily. Apply to affected areas twice a day. 30 g 0  . diphenoxylate-atropine (LOMOTIL) 2.5-0.025 MG tablet TAKE 1 TABLET FOUR TIMES DAILY AS NEEDEDFOR DIARRHEA OR LOOSE STOOLS 30 tablet 1  . Encorafenib 75 MG CAPS Take 300 mg daily by mouth. 120 capsule 4  . lidocaine-prilocaine (EMLA) cream Apply 1 application topically as needed. Apply to port a cath site 1 hour prior to chemotherapy treatments 30 g 6  . loperamide (IMODIUM A-D) 2 MG tablet Take  2 mg by mouth 4 (four) times daily as needed for diarrhea or loose stools.    . minocycline (MINOCIN) 100 MG capsule Take 1 capsule (100 mg total) by mouth daily. 60 capsule 1  . olmesartan (BENICAR) 40 MG tablet Take 1 tablet (40 mg total) daily by mouth. 30 tablet 4  . ondansetron (ZOFRAN) 8 MG tablet Take 1 tablet (8 mg total) by mouth every 8 (eight) hours as needed for nausea or vomiting. 40 tablet 3  . potassium chloride (K-DUR) 10 MEQ tablet TAKE ONE TABLET BY MOUTH TWICE DAILY 60 tablet 3  . prochlorperazine (COMPAZINE) 10 MG tablet TAKE ONE TABLET BY MOUTH EVERY 6 HOURS AS NEEDED FOR NAUSEA OR VOMITING 30 tablet 2  . triamcinolone ointment (KENALOG) 0.5 % Apply 1 application topically 2 (two) times daily. To rash 30 g 0  . metoprolol tartrate (LOPRESSOR) 25 MG tablet Take 1 tablet (25 mg total) by mouth 2 (two) times daily. 60 tablet 3  . naproxen sodium (ANAPROX) 220 MG tablet Take 440 mg by mouth 2 (two) times daily as needed.     No current facility-administered medications for this visit.    Facility-Administered Medications Ordered in Other Visits  Medication Dose Route Frequency Provider Last Rate Last Dose  . heparin lock flush 100 unit/mL  500 Units Intravenous Once Charlaine Dalton R, MD      . [COMPLETED] heparin lock flush 100 unit/mL  500 Units  Intravenous Once [name], MD   500 Units at 06/06/17 1300  . heparin lock flush 100 unit/mL  500 Units Intracatheter Once PRN Charlaine Dalton R, MD      . magnesium sulfate IVPB 2 g 50 mL  2 g Intravenous Once [name], MD 50 mL/hr at 06/06/17 1157 2 g at 06/06/17 1157  . sodium chloride flush (NS) 0.9 % injection 10 mL  10 mL Intravenous PRN [name], MD   10 mL at 11/24/15 0836  . sodium chloride flush (NS) 0.9 % injection 10 mL  10 mL Intravenous PRN [name], MD   10 mL at 06/06/17 0842      .  PHYSICAL EXAMINATION: ECOG PERFORMANCE STATUS: 0 - Asymptomatic  Vitals:   06/06/17 0911  BP: 135/87  Pulse: 83  Resp: 20  Temp: 97.8 F (36.6 C)   Filed Weights   06/06/17 0911  Weight: 202 lb (91.6 kg)    GENERAL: Well-nourished well-developed; Alert, no distress and comfortable. Accompanied by her daughter.  EYES: no pallor or icterus OROPHARYNX: no thrush or ulceration; good dentition  NECK: supple, no masses felt LYMPH:  no palpable lymphadenopathy in the cervical, axillary or inguinal regions LUNGS: clear to auscultation and  No wheeze or crackles HEART/CVS: regular rate & rhythm and no murmurs; No lower extremity edema ABDOMEN: abdomen soft, non-tender and normal bowel sounds; positive for hepatomegaly. Musculoskeletal:no cyanosis of digits and no clubbing  PSYCH: alert & oriented x 3 with fluent speech NEURO: no focal motor/sensory deficits SKIN:  Acne-like rash noted on the torso/face.  LABORATORY DATA:  I have reviewed the data as listed Lab Results  Component Value Date   WBC 8.8 05/31/2017   HGB 11.9 (L) 05/31/2017   HCT 35.5 05/31/2017   MCV 100.7 (H) 05/31/2017   PLT 295 05/31/2017   Recent Labs    05/25/17 1050 05/31/17 1220 06/06/17 0855  NA 136 135 135  K 4.9 4.7 4.6  CL 107 106 108  CO2 22 22  22  GLUCOSE 112* 108* 98  BUN 34* 30* 34*  CREATININE 1.94* 1.83* 1.75*  CALCIUM 8.9 8.3* 8.1*   GFRNONAA 26* 28* 29*  GFRAA 30* 32* 34*  PROT 7.5 6.8 6.6  ALBUMIN 3.1* 2.6* 2.5*  AST 46* 40 40  ALT 31 27 27   ALKPHOS 157* 148* 132*  BILITOT 0.6 0.4 0.3    RADIOGRAPHIC STUDIES: I have personally reviewed the radiological images as listed and agreed with the findings in the report. US Renal  Result Date: 05/31/2017 CLINICAL DATA:  Colon carcinoma with elevated renal function EXAM: RENAL / URINARY TRACT ULTRASOUND COMPLETE COMPARISON:  CT abdomen and pelvis March 30, 2017 FINDINGS: Right Kidney: Length: 10.4 cm Echogenicity and renal cortical thickness are within normal limits. No mass, perinephric fluid, or hydronephrosis visualized. No sonographically demonstrable calculus or ureterectasis. Left Kidney: Length: 11.6 cm. Echogenicity and renal cortical thickness are within normal limits. No perinephric fluid or hydronephrosis visualized. 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 No other mass evident. No sonographically demonstrable calculus or ureterectasis. Bladder: Appears normal for degree of bladder distention. 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 Study otherwise unremarkable. Electronically Signed   By: Lowella Grip III M.D.   On: 05/31/2017 15:59    Results for SHANNA, UN (MRN [id number]) as of 06/06/2017 12:51  Ref. 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 CT Sep 10th- improved metastatic lesions to the liver; slight increase in retroperitoneal lymph nodes from 8-9 mm in size.  Patient currently on Vectibex+ enco+bini.    # Proceed with cycle # Vectibex; + Bini.  Tolerating well without any major side effects- except for acute renal failure.  HOLD enco sec to ARF-[see discussion below]; No obvious progression noted-CEA improving.  # Skin rash- sec to vectibex; improved.  Continue minocycline; clindamycin gel.   # acute renal failure- ? Etiology- creat 1.8;  US kidney-NEG. today-?? HOLD enco; 24 hour urine collection.  Slight improvement creatinine 1.7.  Also check urine eosinophils; sodium.  # diarrhea-improved. Recommend continued Lomotil as needed.  # follow up with me in 2 weeks- NP/labs/CEA/vec. HOLD CT scans for now. Labs this Friday. Also order Urine sodium/eosinophils.     [name], MD 06/06/2017 12:56 PM

## 2017-06-06 NOTE — Patient Instructions (Signed)
#   HOLD Encorafenib x 1 week; recheck kidney numbers in 5 days.   # Discontinue benicar; start Metoprolol 25 mg BID.

## 2017-06-06 NOTE — Progress Notes (Signed)
Called Dr. Rogue Bussing regarding Kathleen James treatment plan of Vectibex. Order says that patient should have magnesium checked at least 7 days prior to infusion. Ordered to add on magnesium lab but is ok to start treatment before lab is resulted.

## 2017-06-06 NOTE — Assessment & Plan Note (Addendum)
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 CT Sep 10th- improved metastatic lesions to the liver; slight increase in retroperitoneal lymph nodes from 8-9 mm in size. Patient currently on Vectibex+ enco+bini.  Improvement of CEA is noted; hold off repeated imaging at this time given the acute renal failure.  # Proceed with cycle # Vectibex; + Bini.  Tolerating well without any major side effects- except for acute renal failure.  HOLD enco sec to ARF-[see discussion below]; No obvious progression noted-CEA improving.  # Skin rash- sec to vectibex; improved.  Continue minocycline; clindamycin gel.   # acute renal failure- ? Etiology- creat 1.8;  US kidney-NEG. today-?? HOLD enco; 24 hour urine collection.  Slight improvement creatinine 1.7.  Also check urine eosinophils; sodium.  # diarrhea-improved. Recommend continued Lomotil as needed.  # follow up with me in 2 weeks- NP/labs/CEA/vec. HOLD CT scans for now. Labs this Friday. Also order Urine sodium/eosinophils.   Addendum: Given the acute renal failure-discontinue Benicar; start metoprolol 25 twice daily

## 2017-06-06 NOTE — Progress Notes (Signed)
Spoke with MD, Dr. Rogue Bussing, via telephone. Per MD order: Comprehensive Metabolic Panel is the only lab that needs to be drawn prior to patient's treatment today.

## 2017-06-10 ENCOUNTER — Inpatient Hospital Stay: Payer: Medicare HMO

## 2017-06-10 DIAGNOSIS — L270 Generalized skin eruption due to drugs and medicaments taken internally: Secondary | ICD-10-CM | POA: Diagnosis not present

## 2017-06-10 DIAGNOSIS — C799 Secondary malignant neoplasm of unspecified site: Secondary | ICD-10-CM

## 2017-06-10 DIAGNOSIS — Z5112 Encounter for antineoplastic immunotherapy: Secondary | ICD-10-CM | POA: Diagnosis not present

## 2017-06-10 DIAGNOSIS — R944 Abnormal results of kidney function studies: Secondary | ICD-10-CM | POA: Diagnosis not present

## 2017-06-10 DIAGNOSIS — C182 Malignant neoplasm of ascending colon: Secondary | ICD-10-CM | POA: Diagnosis not present

## 2017-06-10 DIAGNOSIS — R970 Elevated carcinoembryonic antigen [CEA]: Secondary | ICD-10-CM

## 2017-06-10 DIAGNOSIS — R110 Nausea: Secondary | ICD-10-CM | POA: Diagnosis not present

## 2017-06-10 DIAGNOSIS — D649 Anemia, unspecified: Secondary | ICD-10-CM | POA: Diagnosis not present

## 2017-06-10 DIAGNOSIS — C787 Secondary malignant neoplasm of liver and intrahepatic bile duct: Secondary | ICD-10-CM | POA: Diagnosis not present

## 2017-06-10 DIAGNOSIS — N179 Acute kidney failure, unspecified: Secondary | ICD-10-CM | POA: Diagnosis not present

## 2017-06-10 DIAGNOSIS — R801 Persistent proteinuria, unspecified: Secondary | ICD-10-CM

## 2017-06-10 DIAGNOSIS — R809 Proteinuria, unspecified: Secondary | ICD-10-CM | POA: Diagnosis not present

## 2017-06-10 DIAGNOSIS — C189 Malignant neoplasm of colon, unspecified: Secondary | ICD-10-CM

## 2017-06-10 LAB — CBC WITH DIFFERENTIAL/PLATELET
Basophils Absolute: 0.1 10*3/uL (ref 0–0.1)
Basophils Relative: 1 %
EOS PCT: 1 %
Eosinophils Absolute: 0.1 10*3/uL (ref 0–0.7)
HEMATOCRIT: 36.7 % (ref 35.0–47.0)
Hemoglobin: 12.1 g/dL (ref 12.0–16.0)
LYMPHS PCT: 22 %
Lymphs Abs: 1.7 10*3/uL (ref 1.0–3.6)
MCH: 33.2 pg (ref 26.0–34.0)
MCHC: 32.8 g/dL (ref 32.0–36.0)
MCV: 101.1 fL — AB (ref 80.0–100.0)
MONO ABS: 0.4 10*3/uL (ref 0.2–0.9)
MONOS PCT: 5 %
NEUTROS ABS: 5.3 10*3/uL (ref 1.4–6.5)
Neutrophils Relative %: 71 %
PLATELETS: 305 10*3/uL (ref 150–440)
RBC: 3.63 MIL/uL — ABNORMAL LOW (ref 3.80–5.20)
RDW: 17.1 % — AB (ref 11.5–14.5)
WBC: 7.4 10*3/uL (ref 3.6–11.0)

## 2017-06-10 LAB — PROTEIN, URINE, 24 HOUR
Collection Interval-UPROT: 24 hours
Protein, 24H Urine: 144 mg/d — ABNORMAL HIGH (ref 50–100)
Protein, Urine: 8 mg/dL
Urine Total Volume-UPROT: 1800 mL

## 2017-06-10 LAB — COMPREHENSIVE METABOLIC PANEL
ALBUMIN: 2.7 g/dL — AB (ref 3.5–5.0)
ALK PHOS: 164 U/L — AB (ref 38–126)
ALT: 47 U/L (ref 14–54)
ANION GAP: 5 (ref 5–15)
AST: 83 U/L — AB (ref 15–41)
BILIRUBIN TOTAL: 0.4 mg/dL (ref 0.3–1.2)
BUN: 24 mg/dL — AB (ref 6–20)
CALCIUM: 8.5 mg/dL — AB (ref 8.9–10.3)
CO2: 25 mmol/L (ref 22–32)
Chloride: 103 mmol/L (ref 101–111)
Creatinine, Ser: 1.51 mg/dL — ABNORMAL HIGH (ref 0.44–1.00)
GFR calc Af Amer: 40 mL/min — ABNORMAL LOW (ref >60)
GFR calc non Af Amer: 35 mL/min — ABNORMAL LOW (ref >60)
GLUCOSE: 98 mg/dL (ref 65–99)
POTASSIUM: 4.2 mmol/L (ref 3.5–5.1)
SODIUM: 133 mmol/L — AB (ref 135–145)
Total Protein: 6.8 g/dL (ref 6.5–8.1)

## 2017-06-10 LAB — SODIUM, URINE, RANDOM: Sodium, Ur: 54 mmol/L

## 2017-06-11 LAB — CEA: CEA: 57 ng/mL — ABNORMAL HIGH (ref 0.0–4.7)

## 2017-06-13 LAB — EOSINOPHIL, URINE: Eosinophil, Urine: NONE SEEN %

## 2017-06-16 ENCOUNTER — Encounter: Payer: Self-pay | Admitting: Internal Medicine

## 2017-06-20 ENCOUNTER — Inpatient Hospital Stay: Payer: Medicare HMO

## 2017-06-20 ENCOUNTER — Other Ambulatory Visit: Payer: Self-pay

## 2017-06-20 ENCOUNTER — Inpatient Hospital Stay (HOSPITAL_BASED_OUTPATIENT_CLINIC_OR_DEPARTMENT_OTHER): Payer: Medicare HMO | Admitting: Nurse Practitioner

## 2017-06-20 ENCOUNTER — Other Ambulatory Visit: Payer: Self-pay | Admitting: Hematology and Oncology

## 2017-06-20 VITALS — BP 122/88 | HR 84 | Temp 96.3°F | Wt 199.2 lb

## 2017-06-20 DIAGNOSIS — I10 Essential (primary) hypertension: Secondary | ICD-10-CM | POA: Diagnosis not present

## 2017-06-20 DIAGNOSIS — C787 Secondary malignant neoplasm of liver and intrahepatic bile duct: Secondary | ICD-10-CM | POA: Diagnosis not present

## 2017-06-20 DIAGNOSIS — M1990 Unspecified osteoarthritis, unspecified site: Secondary | ICD-10-CM | POA: Diagnosis not present

## 2017-06-20 DIAGNOSIS — R809 Proteinuria, unspecified: Secondary | ICD-10-CM | POA: Diagnosis not present

## 2017-06-20 DIAGNOSIS — Z79899 Other long term (current) drug therapy: Secondary | ICD-10-CM | POA: Diagnosis not present

## 2017-06-20 DIAGNOSIS — L270 Generalized skin eruption due to drugs and medicaments taken internally: Secondary | ICD-10-CM | POA: Diagnosis not present

## 2017-06-20 DIAGNOSIS — C182 Malignant neoplasm of ascending colon: Secondary | ICD-10-CM | POA: Diagnosis not present

## 2017-06-20 DIAGNOSIS — Z90710 Acquired absence of both cervix and uterus: Secondary | ICD-10-CM | POA: Diagnosis not present

## 2017-06-20 DIAGNOSIS — Z885 Allergy status to narcotic agent status: Secondary | ICD-10-CM | POA: Diagnosis not present

## 2017-06-20 DIAGNOSIS — N179 Acute kidney failure, unspecified: Secondary | ICD-10-CM | POA: Diagnosis not present

## 2017-06-20 DIAGNOSIS — R110 Nausea: Secondary | ICD-10-CM | POA: Diagnosis not present

## 2017-06-20 DIAGNOSIS — R944 Abnormal results of kidney function studies: Secondary | ICD-10-CM | POA: Diagnosis not present

## 2017-06-20 DIAGNOSIS — R197 Diarrhea, unspecified: Secondary | ICD-10-CM | POA: Diagnosis not present

## 2017-06-20 DIAGNOSIS — E669 Obesity, unspecified: Secondary | ICD-10-CM | POA: Diagnosis not present

## 2017-06-20 DIAGNOSIS — Z5112 Encounter for antineoplastic immunotherapy: Secondary | ICD-10-CM | POA: Diagnosis not present

## 2017-06-20 DIAGNOSIS — D649 Anemia, unspecified: Secondary | ICD-10-CM | POA: Diagnosis not present

## 2017-06-20 LAB — CBC WITH DIFFERENTIAL/PLATELET
BASOS ABS: 0 10*3/uL (ref 0–0.1)
Basophils Relative: 1 %
EOS PCT: 3 %
Eosinophils Absolute: 0.2 10*3/uL (ref 0–0.7)
HCT: 36.3 % (ref 35.0–47.0)
HEMOGLOBIN: 12.1 g/dL (ref 12.0–16.0)
LYMPHS ABS: 1.5 10*3/uL (ref 1.0–3.6)
LYMPHS PCT: 18 %
MCH: 33.1 pg (ref 26.0–34.0)
MCHC: 33.2 g/dL (ref 32.0–36.0)
MCV: 99.6 fL (ref 80.0–100.0)
Monocytes Absolute: 0.7 10*3/uL (ref 0.2–0.9)
Monocytes Relative: 8 %
NEUTROS ABS: 6.1 10*3/uL (ref 1.4–6.5)
NEUTROS PCT: 70 %
PLATELETS: 239 10*3/uL (ref 150–440)
RBC: 3.65 MIL/uL — AB (ref 3.80–5.20)
RDW: 17.1 % — ABNORMAL HIGH (ref 11.5–14.5)
WBC: 8.5 10*3/uL (ref 3.6–11.0)

## 2017-06-20 LAB — COMPREHENSIVE METABOLIC PANEL
ALT: 53 U/L (ref 14–54)
ANION GAP: 7 (ref 5–15)
AST: 85 U/L — AB (ref 15–41)
Albumin: 2.7 g/dL — ABNORMAL LOW (ref 3.5–5.0)
Alkaline Phosphatase: 219 U/L — ABNORMAL HIGH (ref 38–126)
BILIRUBIN TOTAL: 0.4 mg/dL (ref 0.3–1.2)
BUN: 18 mg/dL (ref 6–20)
CHLORIDE: 105 mmol/L (ref 101–111)
CO2: 24 mmol/L (ref 22–32)
Calcium: 8.4 mg/dL — ABNORMAL LOW (ref 8.9–10.3)
Creatinine, Ser: 1.55 mg/dL — ABNORMAL HIGH (ref 0.44–1.00)
GFR, EST AFRICAN AMERICAN: 39 mL/min — AB (ref >60)
GFR, EST NON AFRICAN AMERICAN: 34 mL/min — AB (ref >60)
Glucose, Bld: 91 mg/dL (ref 65–99)
POTASSIUM: 3.8 mmol/L (ref 3.5–5.1)
Sodium: 136 mmol/L (ref 135–145)
TOTAL PROTEIN: 7 g/dL (ref 6.5–8.1)

## 2017-06-20 LAB — MAGNESIUM: MAGNESIUM: 1.5 mg/dL — AB (ref 1.7–2.4)

## 2017-06-20 MED ORDER — SODIUM CHLORIDE 0.9% FLUSH
10.0000 mL | INTRAVENOUS | Status: DC | PRN
  Administered 2017-06-20: 10 mL via INTRAVENOUS
  Filled 2017-06-20: qty 10

## 2017-06-20 MED ORDER — HEPARIN SOD (PORK) LOCK FLUSH 100 UNIT/ML IV SOLN
500.0000 [IU] | Freq: Once | INTRAVENOUS | Status: AC
  Administered 2017-06-20: 500 [IU] via INTRAVENOUS

## 2017-06-20 MED ORDER — SODIUM CHLORIDE 0.9 % IV SOLN
Freq: Once | INTRAVENOUS | Status: AC
  Administered 2017-06-20: 11:00:00 via INTRAVENOUS
  Filled 2017-06-20: qty 1000

## 2017-06-20 MED ORDER — SODIUM CHLORIDE 0.9 % IV SOLN
6.0000 mg/kg | Freq: Once | INTRAVENOUS | Status: AC
  Administered 2017-06-20: 560 mg via INTRAVENOUS
  Filled 2017-06-20: qty 20

## 2017-06-20 NOTE — Progress Notes (Signed)
[place] NOTE  [name], [name], MD (Internal Medicine) Bary Castilla, Forest Gleason, [name], RN as Registered Nurse [name], MD as Consulting Physician (Internal Medicine)  CHIEF COMPLAINTS/PURPOSE OF CONSULTATION:   Oncology History   # MARCH/APRIL 2017-NEUROENDOCRINE CA STAGE IV; [name]; [name]; [name]; [name]; [name]; [name]; [name]; July 14th  2017 CT- PR  # AUG 28th- FOLFIRI +Avastin; OCT 5th CT scan- PR.   # MARCH 20th- CT STABLE liver lesions; ? Enlarging cecal lesion [CEA rising]; March 26th- FOLFOX [last avastin march 12th 2018];   # resection of primary tumor [Dr.Byrnett]; y90- June 19th 2018.  # July 11th-FOLFOX + Avastin; SEP 2018 [CT- stable liver lesions; increasing ~3mm RP LN]; RISING CEA [stopped end of Sep 2018 ]  # OCT 15th 2018- Vemu+ Iri+ Pan [mid nov stopped iri- neutropenia/dia+vemu- arthralgias]; 05/11/2017-Started enco+Bini+vectibex   # [facility]- B-RAF V600E- MUTATED [May 2018]  # right kidney lesion 2.6 x 1.6 cm ? Angiolipoma [followed by Urology in past]     Malignant neoplasm of ascending colon ([place])     HISTORY OF PRESENTING ILLNESS:  Kathleen James 66 y.o.  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 Currently, she is on Vec+Enco+Bini. Last cycle of Vectibex + Bini 06/06/17. Enco has been held sec to ARF.  She has suffered a mild skin rash r/t Vectibex, currently on minocycline and clindamycin gel which she says is 'no worse'. She has been increasing her fluid intake. Denies sob or  chest pain. Mild fatigue. Denies leg swelling. Diarrhea is stable on lomotil.    ROS: A complete 10 point review of system is done which is negative except mentioned above in history of present illness  MEDICAL HISTORY:  Past Medical History:  Diagnosis Date  . Angiolipoma of kidney    followed with serial CTs ,,  Dr. Jacqlyn Larsen  . Anxiety   . Arthritis   . 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 Chemotherapy induced diarrhea   . Hammer toe   . Hypertension   . Liver cancer ([place])   . Neuro-endocrine carcinoma ([place])   . Obesity (BMI 30-39.9)     SURGICAL HISTORY: Past Surgical History:  Procedure Laterality Date  . ABDOMINAL HYSTERECTOMY  1995   endometriosis, heavy bleeding  . BUNIONECTOMY Bilateral   . CESAREAN SECTION  1985  . COLONOSCOPY WITH PROPOFOL N/A 10/09/2015   Procedure: COLONOSCOPY WITH PROPOFOL;  Surgeon: Robert Bellow, MD;  [facility] ENDOSCOPY;  Service: Endoscopy;  Laterality: N/A;  . IR ANGIOGRAM SELECTIVE EACH ADDITIONAL VESSEL  11/22/2016  . IR ANGIOGRAM SELECTIVE EACH ADDITIONAL VESSEL  11/22/2016  . IR ANGIOGRAM SELECTIVE EACH ADDITIONAL VESSEL  11/22/2016  . IR ANGIOGRAM SELECTIVE EACH ADDITIONAL VESSEL  12/07/2016  . IR ANGIOGRAM SELECTIVE EACH ADDITIONAL VESSEL  12/07/2016  . IR ANGIOGRAM VISCERAL SELECTIVE  11/22/2016  . IR ANGIOGRAM VISCERAL SELECTIVE  12/07/2016  . IR EMBO ARTERIAL NOT HEMORR HEMANG INC GUIDE ROADMAPPING  11/22/2016  . IR EMBO TUMOR ORGAN ISCHEMIA INFARCT INC GUIDE ROADMAPPING  12/07/2016  . IR GENERIC HISTORICAL  04/07/2016   IR RADIOLOGIST EVAL & MGMT 04/07/2016 Arne Cleveland, MD [facility] INTERV RAD  . IR RADIOLOGIST EVAL & MGMT  04/14/2017  . IR US GUIDE VASC ACCESS RIGHT  11/22/2016  . IR US GUIDE VASC ACCESS RIGHT  12/07/2016  . LAPAROSCOPIC RIGHT COLECTOMY Right 10/26/2016   Procedure: LAPAROSCOPIC RIGHT COLECTOMY;  Surgeon: Robert Bellow, MD;  Location: [facility] ORS;  Service: General;   Laterality: Right;  . OOPHORECTOMY    . PORTACATH PLACEMENT Left 10/01/2015   Procedure: INSERTION PORT-A-CATH;  Surgeon: Robert Bellow, MD;  Location: [facility] ORS;  Service: General;  Laterality: Left;  . ROTATOR CUFF REPAIR Right 2008   right shoulder.  [place]     SOCIAL HISTORY: She lives at home with her family in [place]. 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 Social History   Socioeconomic History  . Marital status: Married    Spouse name: Not on file  . Number of children: Not on file  . Years of education: Not on file  . Highest education level: Not on file  Social Needs  . Financial resource strain: Not on file  . Food insecurity - worry: Not on file  . Food insecurity - inability: Not on file  . Transportation needs - medical: Not on file  . Transportation needs - non-medical: Not on file  Occupational History  . Not on file  Tobacco Use  . Smoking status: Never Smoker  . Smokeless tobacco: Never Used  Substance and Sexual Activity  . Alcohol use: Yes    Alcohol/week: 0.0 oz    Comment: occasionally  . Drug use: No  . Sexual activity: Not Currently  Other Topics Concern  . Not on file  Social History Narrative  . Not on file    FAMILY HISTORY: Family History  Problem Relation Age of Onset  . Mental illness Mother 16       bipolar, dementia  . Cancer Maternal Aunt 70       breast ca  . Hypertension Father   . Stroke Father 67       deceased  . COPD Father   . Heart disease Sister        deceased    ALLERGIES:  is allergic to bee venom; dilaudid [hydromorphone hcl]; and morphine and related.  MEDICATIONS:  Current Outpatient Medications  Medication Sig Dispense Refill  . ALPRAZolam (XANAX) 0.25 MG tablet Take 1 tablet (0.25 mg total) by mouth 2 (two) times daily as needed for anxiety. 60 tablet 1  . amLODipine (NORVASC) 5 MG tablet Take 1 tablet (5 mg total) daily by mouth. 30 tablet 4  . Binimetinib 15  MG TABS Take 45 mg every 12 (twelve) hours by mouth. 180 tablet 4  . Biotin w/ Vitamins C & E (HAIR/SKIN/NAILS PO) Take 1 tablet by mouth daily.    . Chlorpheniramine Maleate (CHLOR-TABLETS PO) Take 1 tablet by mouth every evening.    . clindamycin (CLINDAGEL) 1 % gel Apply topically 2 (two) times daily. Apply to affected areas twice a day. 30 g 0  . Encorafenib 75 MG CAPS Take 300 mg daily by mouth. 120 capsule 4  . lidocaine-prilocaine (EMLA) cream Apply 1 application topically as needed. Apply to port a cath site 1 hour prior to chemotherapy treatments 30 g 6  . loperamide (IMODIUM A-D) 2 MG tablet Take 2 mg by mouth 4 (  four) times daily as needed for diarrhea or loose stools.    . metoprolol tartrate (LOPRESSOR) 25 MG tablet Take 1 tablet (25 mg total) by mouth 2 (two) times daily. 60 tablet 3  . minocycline (MINOCIN) 100 MG capsule Take 1 capsule (100 mg total) by mouth daily. 60 capsule 1  . naproxen sodium (ANAPROX) 220 MG tablet Take 440 mg by mouth 2 (two) times daily as needed.    Marland Kitchen olmesartan (BENICAR) 40 MG tablet Take 1 tablet (40 mg total) daily by mouth. 30 tablet 4  . ondansetron (ZOFRAN) 8 MG tablet Take 1 tablet (8 mg total) by mouth every 8 (eight) hours as needed for nausea or vomiting. 40 tablet 3  . potassium chloride (K-DUR) 10 MEQ tablet TAKE ONE TABLET BY MOUTH TWICE DAILY 60 tablet 3  . prochlorperazine (COMPAZINE) 10 MG tablet TAKE ONE TABLET BY MOUTH EVERY 6 HOURS AS NEEDED FOR NAUSEA OR VOMITING 30 tablet 2  . triamcinolone ointment (KENALOG) 0.5 % Apply 1 application topically 2 (two) times daily. To rash 30 g 0  . diphenoxylate-atropine (LOMOTIL) 2.5-0.025 MG tablet TAKE 1 TABLET FOUR TIMES DAILY AS NEEDEDFOR DIARRHEA OR LOOSE STOOLS 30 tablet 3   No current facility-administered medications for this visit.    Facility-Administered Medications Ordered in Other Visits  Medication Dose Route Frequency Provider Last Rate Last Dose  . heparin lock flush 100 unit/mL   500 Units Intravenous Once Charlaine Dalton R, MD      . sodium chloride flush (NS) 0.9 % injection 10 mL  10 mL Intravenous PRN [name], MD   10 mL at 11/24/15 0836       PHYSICAL EXAMINATION: ECOG PERFORMANCE STATUS: 1 - Symptomatic but completely ambulatory  Vitals:   06/20/17 0940  BP: 122/88  Pulse: 84  Temp: (!) 96.3 F (35.7 C)   Filed Weights   06/20/17 0940  Weight: 199 lb 4 oz (90.4 kg)    GENERAL: Well-nourished well-developed; Alert, no distress and comfortable. Alone. EYES: no pallor or icterus OROPHARYNX: no thrush or ulceration; good dentition  NECK: supple, no masses felt LYMPH:  no palpable lymphadenopathy in the cervical, axillary or inguinal regions LUNGS: clear to auscultation and  No wheeze or crackles HEART/CVS: regular rate & rhythm and no murmurs. No lower extremity edema ABDOMEN: abdomen soft, non-tender and normal bowel sounds; positive for hepatomegaly. Musculoskeletal: no cyanosis of digits and no clubbing  PSYCH: alert & oriented x 3 with fluent speech NEURO: no focal motor/sensory deficits SKIN:  Erythematous papular/acne-like rash across face and upper chest. No evidence of infection. Dry skin with cracking on fingers.   LABORATORY DATA:  I have reviewed the data as listed Lab Results  Component Value Date   WBC 8.5 06/20/2017   HGB 12.1 06/20/2017   HCT 36.3 06/20/2017   MCV 99.6 06/20/2017   PLT 239 06/20/2017   Recent Labs    06/06/17 0855 06/10/17 1152 06/20/17 0928  NA 135 133* 136  K 4.6 4.2 3.8  CL 108 103 105  CO2 22 25 24   GLUCOSE 98 98 91  BUN 34* 24* 18  CREATININE 1.75* 1.51* 1.55*  CALCIUM 8.1* 8.5* 8.4*  GFRNONAA 29* 35* 34*  GFRAA 34* 40* 39*  PROT 6.6 6.8 7.0  ALBUMIN 2.5* 2.7* 2.7*  AST 40 83* 85*  ALT 27 47 53  ALKPHOS 132* 164* 219*  BILITOT 0.3 0.4 0.4    RADIOGRAPHIC STUDIES: I have personally reviewed the radiological images  as listed and agreed with the findings in the  report. US Renal  Result Date: 05/31/2017 CLINICAL DATA:  Colon carcinoma with elevated renal function EXAM: RENAL / URINARY TRACT ULTRASOUND COMPLETE COMPARISON:  CT abdomen and pelvis March 30, 2017 FINDINGS: Right Kidney: Length: 10.4 cm Echogenicity and renal cortical thickness are within normal limits. No mass, perinephric fluid, or hydronephrosis visualized. No sonographically demonstrable calculus or ureterectasis. Left Kidney: Length: 11.6 cm. Echogenicity and renal cortical thickness are within normal limits. No perinephric fluid or hydronephrosis visualized. 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 No other mass evident. No sonographically demonstrable calculus or ureterectasis. Bladder: Appears normal for degree of bladder distention. 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 Study otherwise unremarkable. Electronically Signed   By: Lowella Grip III M.D.   On: 05/31/2017 15:59    Results for YARITZY, HUSER (MRN [id number]) as of 06/06/2017 12:51  Ref. 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 CT 9/10 showed improvement of liver lesions but slight increase in retroperitoneal LN from 8-45mm in size. Currently on Vectibex+Encorafenib+Binimetinib. Binimetinib currently held d/t ARF (see discussion below). Could consider re-imaging w/ contrast once creatinine improves.   # Proceed with Vectibex today. Tolerating well other than skin rash (see discussion below). CEA improving. No obvious progression  on clinical exam. Will discuss with primary oncologist, Dr. Rogue Bussing.   # acute renal failure- Cr 1.55 - improved. US kidney-neg. 24 hr urine protein 144 (slightly elevated), urine eosinophils- negative, urine sodium- 54. No obvious etiology. May be related to [facility]. Will hold [place] at this time and re-evaluate labs in 1 week. Encourage hydration. Continue to avoid contrast dye at this time.   # skin rash- consisitent with Vectibex assoc skin rash. Stable, grade 1. Continue oral minocycline and clindamycin gel. Discussed avoidance of irritants and sunshine. Encouraged hydrating emollients. Continue to monitor.   # hypomagnesemia- Mg 1.5 (06/20/17) - re-check at next visit.   # diarrhea- stable and unchanged. Continue Lomotil as needed.   # Hypertension- in setting of arf, benicar d/c. On metoprolol 25mg  BID. BP 122/88, HR 84 today. Continue to monitor.   # 1 week- labs/Dr.Brahmanday; 2 weeks- labs/Brahmanday/vectibex.     Beckey Rutter, DNP, AGNP-C [place] at [facility] [phone] (office) 06/27/17 9:09 AM

## 2017-06-20 NOTE — Progress Notes (Signed)
Patient here today for follow up.  Patient c/o increasing neuropathy in fingers and feet, dry skin and cracking/sores on the tips of her fingers.

## 2017-06-20 NOTE — Progress Notes (Signed)
Labs reviewed with Ander Purpura, NP and Dr. Grayland Ormond, proceed with treatment per provider.

## 2017-06-22 ENCOUNTER — Other Ambulatory Visit: Payer: Self-pay | Admitting: Oncology

## 2017-06-22 DIAGNOSIS — T451X5A Adverse effect of antineoplastic and immunosuppressive drugs, initial encounter: Principal | ICD-10-CM

## 2017-06-22 DIAGNOSIS — K521 Toxic gastroenteritis and colitis: Secondary | ICD-10-CM

## 2017-06-27 ENCOUNTER — Telehealth: Payer: Self-pay | Admitting: Internal Medicine

## 2017-06-27 ENCOUNTER — Encounter: Payer: Self-pay | Admitting: Nurse Practitioner

## 2017-06-27 NOTE — Telephone Encounter (Signed)
Oral Oncology Patient Advocate Encounter  Faxed application with dates of 06/21/2017 to [employer] per their request.    [facility] Patient Advocate [phone] 06/27/2017 1:36 PM

## 2017-06-27 NOTE — Assessment & Plan Note (Signed)
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 CT 9/10 showed improvement of liver lesions but slight increase in retroperitoneal LN from 8-60mm in size. Currently on Vectibex+Encorafenib+Binimetinib. Binimetinib currently held d/t ARF (see discussion below). Could consider re-imaging w/ contrast once creatinine improves.   # Proceed with Vectibex today. Tolerating well other than skin rash (see discussion below). CEA improving. No obvious progression on clinical exam. Will discuss with primary oncologist, Dr. Rogue Bussing.   # acute renal failure- Cr 1.55 - improved. US kidney-neg. 24 hr urine protein 144 (slightly elevated), urine eosinophils- negative, urine sodium- 54. No obvious etiology. May be related to [facility]. Will hold [place] at this time and re-evaluate labs in 1 week. Encourage hydration. Continue to avoid contrast dye at this time.   # skin rash- consisitent with Vectibex assoc skin rash. Stable, grade 1. Continue oral minocycline and clindamycin gel. Discussed avoidance of irritants and sunshine. Encouraged hydrating emollients. Continue to monitor.   # hypomagnesemia- Mg 1.5 (06/20/17) - re-check at next visit.   # diarrhea- stable and unchanged. Continue Lomotil as needed.   # Hypertension- in setting of arf, benicar d/c. On metoprolol 25mg  BID. BP 122/88, HR 84 today. Continue to monitor.   # 1 week- labs/Dr.Brahmanday; 2 weeks- labs/Brahmanday/vectibex.

## 2017-06-27 NOTE — Telephone Encounter (Signed)
Oral Oncology Patient Advocate Encounter  Received notification from [facility] that prior authorization for [employer] & [employer]rean [employer] is required.  PA submitted on CoverMyMeds Key [id number] Status is pending  Oral Oncology Clinic will continue to follow.    [place] Patient Advocate [phone] 06/27/2017 12:29 PM

## 2017-06-28 ENCOUNTER — Inpatient Hospital Stay: Payer: Medicare HMO

## 2017-06-28 ENCOUNTER — Encounter: Payer: Self-pay | Admitting: Internal Medicine

## 2017-06-28 ENCOUNTER — Inpatient Hospital Stay: Payer: Medicare HMO | Attending: Internal Medicine | Admitting: Internal Medicine

## 2017-06-28 VITALS — BP 145/92 | HR 73 | Temp 97.8°F | Resp 16 | Wt 200.3 lb

## 2017-06-28 DIAGNOSIS — N179 Acute kidney failure, unspecified: Secondary | ICD-10-CM

## 2017-06-28 DIAGNOSIS — C182 Malignant neoplasm of ascending colon: Secondary | ICD-10-CM

## 2017-06-28 DIAGNOSIS — R21 Rash and other nonspecific skin eruption: Secondary | ICD-10-CM

## 2017-06-28 DIAGNOSIS — M1990 Unspecified osteoarthritis, unspecified site: Secondary | ICD-10-CM | POA: Diagnosis not present

## 2017-06-28 DIAGNOSIS — Z885 Allergy status to narcotic agent status: Secondary | ICD-10-CM

## 2017-06-28 DIAGNOSIS — R197 Diarrhea, unspecified: Secondary | ICD-10-CM | POA: Diagnosis not present

## 2017-06-28 DIAGNOSIS — Z79899 Other long term (current) drug therapy: Secondary | ICD-10-CM | POA: Diagnosis not present

## 2017-06-28 DIAGNOSIS — I10 Essential (primary) hypertension: Secondary | ICD-10-CM

## 2017-06-28 DIAGNOSIS — F418 Other specified anxiety disorders: Secondary | ICD-10-CM | POA: Insufficient documentation

## 2017-06-28 DIAGNOSIS — C787 Secondary malignant neoplasm of liver and intrahepatic bile duct: Secondary | ICD-10-CM | POA: Insufficient documentation

## 2017-06-28 DIAGNOSIS — R69 Illness, unspecified: Secondary | ICD-10-CM | POA: Diagnosis not present

## 2017-06-28 LAB — CBC WITH DIFFERENTIAL/PLATELET
Basophils Absolute: 0 10*3/uL (ref 0–0.1)
Basophils Relative: 1 %
Eosinophils Absolute: 0.1 10*3/uL (ref 0–0.7)
Eosinophils Relative: 4 %
HEMATOCRIT: 34.6 % — AB (ref 35.0–47.0)
HEMOGLOBIN: 11.5 g/dL — AB (ref 12.0–16.0)
LYMPHS ABS: 0.7 10*3/uL — AB (ref 1.0–3.6)
Lymphocytes Relative: 17 %
MCH: 33.6 pg (ref 26.0–34.0)
MCHC: 33.3 g/dL (ref 32.0–36.0)
MCV: 101 fL — ABNORMAL HIGH (ref 80.0–100.0)
MONO ABS: 0.5 10*3/uL (ref 0.2–0.9)
MONOS PCT: 12 %
NEUTROS ABS: 2.6 10*3/uL (ref 1.4–6.5)
NEUTROS PCT: 66 %
Platelets: 172 10*3/uL (ref 150–440)
RBC: 3.43 MIL/uL — ABNORMAL LOW (ref 3.80–5.20)
RDW: 17.6 % — AB (ref 11.5–14.5)
WBC: 4 10*3/uL (ref 3.6–11.0)

## 2017-06-28 LAB — COMPREHENSIVE METABOLIC PANEL
ALBUMIN: 2.8 g/dL — AB (ref 3.5–5.0)
ALK PHOS: 184 U/L — AB (ref 38–126)
ALT: 28 U/L (ref 14–54)
ANION GAP: 7 (ref 5–15)
AST: 37 U/L (ref 15–41)
BUN: 22 mg/dL — ABNORMAL HIGH (ref 6–20)
CALCIUM: 8.3 mg/dL — AB (ref 8.9–10.3)
CHLORIDE: 104 mmol/L (ref 101–111)
CO2: 27 mmol/L (ref 22–32)
CREATININE: 1.41 mg/dL — AB (ref 0.44–1.00)
GFR calc non Af Amer: 38 mL/min — ABNORMAL LOW (ref >60)
GFR, EST AFRICAN AMERICAN: 44 mL/min — AB (ref >60)
GLUCOSE: 97 mg/dL (ref 65–99)
Potassium: 4.1 mmol/L (ref 3.5–5.1)
Sodium: 138 mmol/L (ref 135–145)
Total Bilirubin: 0.3 mg/dL (ref 0.3–1.2)
Total Protein: 7.3 g/dL (ref 6.5–8.1)

## 2017-06-28 LAB — MAGNESIUM: MAGNESIUM: 1.3 mg/dL — AB (ref 1.7–2.4)

## 2017-06-28 MED ORDER — TRIAMCINOLONE ACETONIDE 0.5 % EX OINT: 1.0000 "application " | TOPICAL_OINTMENT | Freq: Two times a day (BID) | CUTANEOUS | 0 refills | Status: DC

## 2017-06-28 NOTE — Telephone Encounter (Signed)
Oral Oncology Patient Advocate Encounter  Prior Authorization for [facility] & Kathi Der has been approved.    PA# [id number], [id number] Effective dates: 06/19/2017 through 06/20/2018  Oral Oncology Clinic will continue to follow.    Faxed copy of approval to [employer] [phone]   [place] Patient Advocate [phone] 06/28/2017 1:52 PM

## 2017-06-28 NOTE — Progress Notes (Signed)
[place] NOTE  [name], [name], MD (Internal Medicine) Bary Castilla, Forest Gleason, [name], RN as Registered Nurse [name], MD as Consulting Physician (Internal Medicine)  CHIEF COMPLAINTS/PURPOSE OF CONSULTATION:   Oncology History   # MARCH/APRIL 2017-NEUROENDOCRINE CA STAGE IV; [name]; [name]; [name]; [name]; [name]; [name]; [name]; July 14th  2017 CT- PR  # AUG 28th- FOLFIRI +Avastin; OCT 5th CT scan- PR.   # MARCH 20th- CT STABLE liver lesions; ? Enlarging cecal lesion [CEA rising]; March 26th- FOLFOX [last avastin march 12th 2018];   # resection of primary tumor [Dr.Byrnett]; y90- June 19th 2018.  # July 11th-FOLFOX + Avastin; SEP 2018 [CT- stable liver lesions; increasing ~7mm RP LN]; RISING CEA [stopped end of Sep 2018 ]  # OCT 15th 2018- Vemu+ Iri+ Pan [mid nov stopped iri- neutropenia/dia+vemu- arthralgias]; 05/11/2017-Started enco+Bini+vectibex   # [facility]- B-RAF [id number]- MUTATED [May 2018]  # right kidney lesion 2.6 x 1.6 cm ? Angiolipoma [followed by Urology in past]     Malignant neoplasm of ascending colon ([place])     HISTORY OF PRESENTING ILLNESS:  Kathleen James 67 y.o.  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  Patient received her vectibex approximately a week ago. Enco+Bini is on HOLD for last 1-2 weeks sec to worsening renal function.   Patient continues to have rash on the torso face back.  Itchy.  She has been using triamcinolone ointment; also on minocycline.  She has not been using any  hydrocortisone ointment.  Patient's diarrhea has improved. Denies any shortness of breath or chest pain.  Mild fatigue.  No swelling in the legs.  ROS: A complete 10 point review of system is done which is negative except mentioned above in history of present illness  MEDICAL HISTORY:  Past Medical History:  Diagnosis Date  . Angiolipoma of kidney    followed with serial CTs ,,  Dr. Jacqlyn Larsen  . Anxiety   . Arthritis   . 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 Chemotherapy induced diarrhea   . Hammer toe   . Hypertension   . Liver cancer ([place])   . Neuro-endocrine carcinoma ([place])   . Obesity (BMI 30-39.9)     SURGICAL HISTORY: Past Surgical History:  Procedure Laterality Date  . ABDOMINAL HYSTERECTOMY  1995   endometriosis, heavy bleeding  . BUNIONECTOMY Bilateral   . CESAREAN SECTION  1985  . COLONOSCOPY WITH PROPOFOL N/A 10/09/2015   Procedure: COLONOSCOPY WITH PROPOFOL;  Surgeon: Robert Bellow, MD;  Location: [facility] ENDOSCOPY;  Service: Endoscopy;  Laterality: N/A;  . IR ANGIOGRAM SELECTIVE EACH ADDITIONAL VESSEL  11/22/2016  . IR ANGIOGRAM SELECTIVE EACH ADDITIONAL VESSEL  11/22/2016  . IR ANGIOGRAM SELECTIVE EACH ADDITIONAL VESSEL  11/22/2016  . IR ANGIOGRAM SELECTIVE EACH ADDITIONAL VESSEL  12/07/2016  . IR ANGIOGRAM SELECTIVE EACH ADDITIONAL VESSEL  12/07/2016  . IR ANGIOGRAM VISCERAL SELECTIVE  11/22/2016  . IR ANGIOGRAM VISCERAL SELECTIVE  12/07/2016  . IR EMBO ARTERIAL NOT HEMORR HEMANG INC GUIDE ROADMAPPING  11/22/2016  .  IR EMBO TUMOR ORGAN ISCHEMIA INFARCT INC GUIDE ROADMAPPING  12/07/2016  . IR GENERIC HISTORICAL  04/07/2016   IR RADIOLOGIST EVAL & MGMT 04/07/2016 Arne Cleveland, MD [facility] INTERV RAD  . IR RADIOLOGIST EVAL & MGMT  04/14/2017  . IR US GUIDE VASC ACCESS RIGHT  11/22/2016  . IR US GUIDE VASC ACCESS RIGHT  12/07/2016  . LAPAROSCOPIC RIGHT COLECTOMY Right 10/26/2016   Procedure: LAPAROSCOPIC RIGHT COLECTOMY;   Surgeon: Robert Bellow, MD;  Location: [facility] ORS;  Service: General;  Laterality: Right;  . OOPHORECTOMY    . PORTACATH PLACEMENT Left 10/01/2015   Procedure: INSERTION PORT-A-CATH;  Surgeon: Robert Bellow, MD;  Location: [facility] ORS;  Service: General;  Laterality: Left;  . ROTATOR CUFF REPAIR Right 2008   right shoulder.  [place]     SOCIAL HISTORY: She lives at home with her family in [place]. 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 Social History   Socioeconomic History  . Marital status: Married    Spouse name: Not on file  . Number of children: Not on file  . Years of education: Not on file  . Highest education level: Not on file  Social Needs  . Financial resource strain: Not on file  . Food insecurity - worry: Not on file  . Food insecurity - inability: Not on file  . Transportation needs - medical: Not on file  . Transportation needs - non-medical: Not on file  Occupational History  . Not on file  Tobacco Use  . Smoking status: Never Smoker  . Smokeless tobacco: Never Used  Substance and Sexual Activity  . Alcohol use: Yes    Alcohol/week: 0.0 oz    Comment: occasionally  . Drug use: No  . Sexual activity: Not Currently  Other Topics Concern  . Not on file  Social History Narrative  . Not on file    FAMILY HISTORY: Family History  Problem Relation Age of Onset  . Mental illness Mother 46       bipolar, dementia  . Cancer Maternal Aunt 70       breast ca  . Hypertension Father   . Stroke Father 70       deceased  . COPD Father   . Heart disease Sister        deceased    ALLERGIES:  is allergic to bee venom; dilaudid [hydromorphone hcl]; and morphine and related.  MEDICATIONS:  Current Outpatient Medications  Medication Sig Dispense Refill  . ALPRAZolam (XANAX) 0.25 MG tablet Take 1 tablet (0.25 mg total) by mouth 2 (two) times daily as needed for anxiety. 60 tablet 1  . amLODipine (NORVASC) 5 MG tablet  Take 1 tablet (5 mg total) daily by mouth. 30 tablet 4  . Biotin w/ Vitamins C & E (HAIR/SKIN/NAILS PO) Take 1 tablet by mouth daily.    . Chlorpheniramine Maleate (CHLOR-TABLETS PO) Take 1 tablet by mouth every evening.    . clindamycin (CLINDAGEL) 1 % gel Apply topically 2 (two) times daily. Apply to affected areas twice a day. 30 g 0  . diphenoxylate-atropine (LOMOTIL) 2.5-0.025 MG tablet TAKE 1 TABLET FOUR TIMES DAILY AS NEEDEDFOR DIARRHEA OR LOOSE STOOLS 30 tablet 3  . lidocaine-prilocaine (EMLA) cream Apply 1 application topically as needed. Apply to port a cath site 1 hour prior to chemotherapy treatments 30 g 6  . loperamide (IMODIUM A-D) 2 MG tablet Take 2 mg by mouth 4 (four)  times daily as needed for diarrhea or loose stools.    . metoprolol tartrate (LOPRESSOR) 25 MG tablet Take 1 tablet (25 mg total) by mouth 2 (two) times daily. 60 tablet 3  . minocycline (MINOCIN) 100 MG capsule Take 1 capsule (100 mg total) by mouth daily. 60 capsule 1  . naproxen sodium (ANAPROX) 220 MG tablet Take 440 mg by mouth 2 (two) times daily as needed.    . ondansetron (ZOFRAN) 8 MG tablet Take 1 tablet (8 mg total) by mouth every 8 (eight) hours as needed for nausea or vomiting. 40 tablet 3  . potassium chloride (K-DUR) 10 MEQ tablet TAKE ONE TABLET BY MOUTH TWICE DAILY 60 tablet 3  . triamcinolone ointment (KENALOG) 0.5 % Apply 1 application topically 2 (two) times daily. To rash 30 g 0  . Binimetinib 15 MG TABS Take 45 mg every 12 (twelve) hours by mouth. (Patient not taking: Reported on 06/28/2017) 180 tablet 4  . Encorafenib 75 MG CAPS Take 300 mg daily by mouth. (Patient not taking: Reported on 06/28/2017) 120 capsule 4  . prochlorperazine (COMPAZINE) 10 MG tablet TAKE ONE TABLET BY MOUTH EVERY 6 HOURS AS NEEDED FOR NAUSEA OR VOMITING (Patient not taking: Reported on 06/28/2017) 30 tablet 2  . triamcinolone ointment (KENALOG) 0.5 % Apply 1 application topically 2 (two) times daily. 30 g 0   No current  facility-administered medications for this visit.    Facility-Administered Medications Ordered in Other Visits  Medication Dose Route Frequency Provider Last Rate Last Dose  . heparin lock flush 100 unit/mL  500 Units Intravenous Once Charlaine Dalton R, MD      . sodium chloride flush (NS) 0.9 % injection 10 mL  10 mL Intravenous PRN [name], MD   10 mL at 11/24/15 0836      .  PHYSICAL EXAMINATION: ECOG PERFORMANCE STATUS: 0 - Asymptomatic  Vitals:   06/28/17 1340  BP: (!) 145/92  Pulse: 73  Resp: 16  Temp: 97.8 F (36.6 C)   Filed Weights   06/28/17 1340  Weight: 200 lb 4.6 oz (90.9 kg)    GENERAL: Well-nourished well-developed; Alert, no distress and comfortable. Accompanied by her daughter.  EYES: no pallor or icterus OROPHARYNX: no thrush or ulceration; good dentition  NECK: supple, no masses felt LYMPH:  no palpable lymphadenopathy in the cervical, axillary or inguinal regions LUNGS: clear to auscultation and  No wheeze or crackles HEART/CVS: regular rate & rhythm and no murmurs; No lower extremity edema ABDOMEN: abdomen soft, non-tender and normal bowel sounds; positive for hepatomegaly. Musculoskeletal:no cyanosis of digits and no clubbing  PSYCH: alert & oriented x 3 with fluent speech NEURO: no focal motor/sensory deficits SKIN:  Acne-like rash noted on the torso/face.  LABORATORY DATA:  I have reviewed the data as listed Lab Results  Component Value Date   WBC 4.0 06/28/2017   HGB 11.5 (L) 06/28/2017   HCT 34.6 (L) 06/28/2017   MCV 101.0 (H) 06/28/2017   PLT 172 06/28/2017   Recent Labs    06/10/17 1152 06/20/17 0928 06/28/17 1431  NA 133* 136 138  K 4.2 3.8 4.1  CL 103 105 104  CO2 25 24 27   GLUCOSE 98 91 97  BUN 24* 18 22*  CREATININE 1.51* 1.55* 1.41*  CALCIUM 8.5* 8.4* 8.3*  GFRNONAA 35* 34* 38*  GFRAA 40* 39* 44*  PROT 6.8 7.0 7.3  ALBUMIN 2.7* 2.7* 2.8*  AST 83* 85* 37  ALT 47 53 28  ALKPHOS 164* 219* 184*   BILITOT 0.4 0.4 0.3    RADIOGRAPHIC STUDIES: I have personally reviewed the radiological images as listed and agreed with the findings in the report. US Renal  Result Date: 05/31/2017 CLINICAL DATA:  Colon carcinoma with elevated renal function EXAM: RENAL / URINARY TRACT ULTRASOUND COMPLETE COMPARISON:  CT abdomen and pelvis March 30, 2017 FINDINGS: Right Kidney: Length: 10.4 cm Echogenicity and renal cortical thickness are within normal limits. No mass, perinephric fluid, or hydronephrosis visualized. No sonographically demonstrable calculus or ureterectasis. Left Kidney: Length: 11.6 cm. Echogenicity and renal cortical thickness are within normal limits. No perinephric fluid or hydronephrosis visualized. 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 No other mass evident. No sonographically demonstrable calculus or ureterectasis. Bladder: Appears normal for degree of bladder distention. 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 Study otherwise unremarkable. Electronically Signed   By: Lowella Grip III M.D.   On: 05/31/2017 15:59    Results for KEIRI, SOLANO (MRN [id number]) as of 06/06/2017 12:51  Ref. 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 CT Sep 10th- improved metastatic lesions to the liver; slight increase in retroperitoneal lymph nodes from 8-9 mm in size.   # Patient currently on Vectibex; On HOLDD- enco+bini.  Improvement of CEA is noted; hold off repeated imaging at this time given the acute  renal failure.  # Proceed with cycle # Vectibex; + Bini.  Tolerating well without any major side effects- except for acute renal failure.  HOLD enco sec to ARF-[see discussion below]; No obvious progression noted-CEA improving.  # Skin rash- sec to vectibex; improved.  Continue minocycline; clindamycin gel.   # acute renal failure- ? Etiology- creat 1.8;  US kidney-NEG. today-?? HOLD enco; 24 hour urine collection.  Slight improvement creatinine 1.7.  Also check urine eosinophils; sodium.  # diarrhea-improved. Recommend continued Lomotil as needed.  # follow up with me in 2 weeks- NP/labs/CEA/vec. HOLD CT scans for now. Labs this Friday. Also order Urine sodium/eosinophils.   #      [name], MD 06/29/2017 10:47 AM

## 2017-06-28 NOTE — Assessment & Plan Note (Addendum)
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 CT Sep 10th- improved metastatic lesions to the liver; slight increase in retroperitoneal lymph nodes from 8-9 mm in size.   # Patient currently on Vectibex; On HOLDING- enco+bini; Recent Improvement of CEA is noted; hold off repeated imaging at this time given the acute renal failure.  # Await labs from today to review the plan for re-starting Bini+enco.   # Skin rash- sec to vectibex; improved.  Continue minocycline; clindamycin gel. Recommend adding kenalog ointment. Script sent.   # acute renal failure- ? Etiology- creat 1.8;  US kidney-NEG ? Sec to [employer]; await labs from today for further recommendations.   # diarrhea-improved. Recommend continued Lomotil as needed.  #Follow-up as planned in 1 week treatment labs.  Addendum: Unfortunately CEA risen to 164; creatinine improved at 1.4.  I suspect CEA is rising because of holding Enco+bini.  Recommend restarting-lower dose enco- 3 pills BID+ Bini-2 pills once a day.   #Acute renal failure-suspect from Enco+bini.  Patient needs this treatment for disease control.  Will make a referral to nephrology for further evaluation/recommendations.   # left message for pt to discuss above recommendations

## 2017-06-29 ENCOUNTER — Other Ambulatory Visit: Payer: Self-pay | Admitting: *Deleted

## 2017-06-29 ENCOUNTER — Telehealth: Payer: Self-pay | Admitting: Internal Medicine

## 2017-06-29 DIAGNOSIS — N179 Acute kidney failure, unspecified: Secondary | ICD-10-CM

## 2017-06-29 LAB — CEA: CEA: 164.5 ng/mL — ABNORMAL HIGH (ref 0.0–4.7)

## 2017-06-29 NOTE — Telephone Encounter (Signed)
Schedule pt also for mag 2 gm when she comes for infusion next week.   ------------------------------------------------   Unfortunately CEA risen to 164; creatinine improved at 1.4.  I suspect CEA is rising because of holding Enco+bini.  Recommend restarting-lower dose enco- 3 pills BID+ Bini-2 pills once a day.   #Acute renal failure-suspect from Enco+bini.  Patient needs this treatment for disease control.  Will make a referral to nephrology for further evaluation/recommendations.   # left message for pt to discuss above recommendations.    --------------------------------------

## 2017-06-29 NOTE — Telephone Encounter (Signed)
Referral initiated to Nephrology-orders entered and scheduling team notified to arrange apts.

## 2017-06-29 NOTE — Telephone Encounter (Signed)
#   reduce the dose- Bini- 2 pills BID; Encoraf 3 pills/ once a day; discussed with pt  # also recommend nephrology referral- Dr.Singh

## 2017-06-29 NOTE — Telephone Encounter (Signed)
Patient returned Dr. Aletha Halim call. MD spoke with patient. Patient's IV mag has been added to her pt schedule.

## 2017-07-04 ENCOUNTER — Telehealth: Payer: Self-pay | Admitting: Internal Medicine

## 2017-07-04 ENCOUNTER — Other Ambulatory Visit: Payer: Self-pay

## 2017-07-04 ENCOUNTER — Inpatient Hospital Stay (HOSPITAL_BASED_OUTPATIENT_CLINIC_OR_DEPARTMENT_OTHER): Payer: Medicare HMO | Admitting: Internal Medicine

## 2017-07-04 ENCOUNTER — Inpatient Hospital Stay: Payer: Medicare HMO

## 2017-07-04 ENCOUNTER — Inpatient Hospital Stay: Payer: Medicare HMO | Attending: Internal Medicine

## 2017-07-04 ENCOUNTER — Encounter: Payer: Self-pay | Admitting: Internal Medicine

## 2017-07-04 VITALS — BP 137/85 | HR 76 | Temp 97.9°F | Ht 65.0 in | Wt 202.3 lb

## 2017-07-04 DIAGNOSIS — C787 Secondary malignant neoplasm of liver and intrahepatic bile duct: Secondary | ICD-10-CM | POA: Diagnosis not present

## 2017-07-04 DIAGNOSIS — M1990 Unspecified osteoarthritis, unspecified site: Secondary | ICD-10-CM | POA: Insufficient documentation

## 2017-07-04 DIAGNOSIS — C182 Malignant neoplasm of ascending colon: Secondary | ICD-10-CM | POA: Insufficient documentation

## 2017-07-04 DIAGNOSIS — R590 Localized enlarged lymph nodes: Secondary | ICD-10-CM

## 2017-07-04 DIAGNOSIS — N179 Acute kidney failure, unspecified: Secondary | ICD-10-CM | POA: Insufficient documentation

## 2017-07-04 DIAGNOSIS — R112 Nausea with vomiting, unspecified: Secondary | ICD-10-CM

## 2017-07-04 DIAGNOSIS — R197 Diarrhea, unspecified: Secondary | ICD-10-CM

## 2017-07-04 DIAGNOSIS — Z79899 Other long term (current) drug therapy: Secondary | ICD-10-CM | POA: Insufficient documentation

## 2017-07-04 DIAGNOSIS — R5383 Other fatigue: Secondary | ICD-10-CM | POA: Diagnosis not present

## 2017-07-04 DIAGNOSIS — R21 Rash and other nonspecific skin eruption: Secondary | ICD-10-CM

## 2017-07-04 DIAGNOSIS — T451X5S Adverse effect of antineoplastic and immunosuppressive drugs, sequela: Secondary | ICD-10-CM | POA: Insufficient documentation

## 2017-07-04 DIAGNOSIS — D1771 Benign lipomatous neoplasm of kidney: Secondary | ICD-10-CM

## 2017-07-04 DIAGNOSIS — Z5112 Encounter for antineoplastic immunotherapy: Secondary | ICD-10-CM | POA: Diagnosis present

## 2017-07-04 DIAGNOSIS — C189 Malignant neoplasm of colon, unspecified: Secondary | ICD-10-CM

## 2017-07-04 DIAGNOSIS — E669 Obesity, unspecified: Secondary | ICD-10-CM | POA: Diagnosis not present

## 2017-07-04 DIAGNOSIS — C799 Secondary malignant neoplasm of unspecified site: Principal | ICD-10-CM

## 2017-07-04 DIAGNOSIS — R970 Elevated carcinoembryonic antigen [CEA]: Secondary | ICD-10-CM

## 2017-07-04 DIAGNOSIS — I10 Essential (primary) hypertension: Secondary | ICD-10-CM | POA: Diagnosis not present

## 2017-07-04 DIAGNOSIS — L270 Generalized skin eruption due to drugs and medicaments taken internally: Secondary | ICD-10-CM | POA: Diagnosis not present

## 2017-07-04 LAB — CBC WITH DIFFERENTIAL/PLATELET
Basophils Absolute: 0 10*3/uL (ref 0–0.1)
Basophils Relative: 1 %
EOS ABS: 0.2 10*3/uL (ref 0–0.7)
EOS PCT: 3 %
HCT: 35.4 % (ref 35.0–47.0)
HEMOGLOBIN: 11.8 g/dL — AB (ref 12.0–16.0)
LYMPHS ABS: 0.8 10*3/uL — AB (ref 1.0–3.6)
LYMPHS PCT: 12 %
MCH: 33.1 pg (ref 26.0–34.0)
MCHC: 33.4 g/dL (ref 32.0–36.0)
MCV: 99.3 fL (ref 80.0–100.0)
Monocytes Absolute: 0.6 10*3/uL (ref 0.2–0.9)
Monocytes Relative: 9 %
NEUTROS PCT: 75 %
Neutro Abs: 4.9 10*3/uL (ref 1.4–6.5)
PLATELETS: 306 10*3/uL (ref 150–440)
RBC: 3.57 MIL/uL — AB (ref 3.80–5.20)
RDW: 16.8 % — ABNORMAL HIGH (ref 11.5–14.5)
WBC: 6.5 10*3/uL (ref 3.6–11.0)

## 2017-07-04 LAB — COMPREHENSIVE METABOLIC PANEL
ALT: 24 U/L (ref 14–54)
AST: 34 U/L (ref 15–41)
Albumin: 3 g/dL — ABNORMAL LOW (ref 3.5–5.0)
Alkaline Phosphatase: 173 U/L — ABNORMAL HIGH (ref 38–126)
Anion gap: 8 (ref 5–15)
BILIRUBIN TOTAL: 0.6 mg/dL (ref 0.3–1.2)
BUN: 24 mg/dL — ABNORMAL HIGH (ref 6–20)
CHLORIDE: 107 mmol/L (ref 101–111)
CO2: 21 mmol/L — ABNORMAL LOW (ref 22–32)
CREATININE: 1.87 mg/dL — AB (ref 0.44–1.00)
Calcium: 8.5 mg/dL — ABNORMAL LOW (ref 8.9–10.3)
GFR, EST AFRICAN AMERICAN: 31 mL/min — AB (ref >60)
GFR, EST NON AFRICAN AMERICAN: 27 mL/min — AB (ref >60)
Glucose, Bld: 92 mg/dL (ref 65–99)
POTASSIUM: 3.9 mmol/L (ref 3.5–5.1)
Sodium: 136 mmol/L (ref 135–145)
Total Protein: 7 g/dL (ref 6.5–8.1)

## 2017-07-04 LAB — MAGNESIUM: MAGNESIUM: 1.3 mg/dL — AB (ref 1.7–2.4)

## 2017-07-04 MED ORDER — HEPARIN SOD (PORK) LOCK FLUSH 100 UNIT/ML IV SOLN
500.0000 [IU] | Freq: Once | INTRAVENOUS | Status: AC
  Administered 2017-07-04: 500 [IU] via INTRAVENOUS
  Filled 2017-07-04: qty 5

## 2017-07-04 MED ORDER — SODIUM CHLORIDE 0.9 % IV SOLN: Freq: Once | INTRAVENOUS | Status: DC

## 2017-07-04 MED ORDER — MAGNESIUM SULFATE 2 GM/50ML IV SOLN
2.0000 g | Freq: Once | INTRAVENOUS | Status: AC
  Administered 2017-07-04: 2 g via INTRAVENOUS
  Filled 2017-07-04: qty 50

## 2017-07-04 MED ORDER — SODIUM CHLORIDE 0.9 % IV SOLN
4.0000 mg/kg | Freq: Once | INTRAVENOUS | Status: AC
  Administered 2017-07-04: 360 mg via INTRAVENOUS
  Filled 2017-07-04: qty 18

## 2017-07-04 MED ORDER — SODIUM CHLORIDE 0.9% FLUSH
10.0000 mL | INTRAVENOUS | Status: DC | PRN
  Administered 2017-07-04: 10 mL via INTRAVENOUS
  Filled 2017-07-04: qty 10

## 2017-07-04 MED ORDER — SODIUM CHLORIDE 0.9 % IV SOLN
Freq: Once | INTRAVENOUS | Status: AC
  Administered 2017-07-04: 10:00:00 via INTRAVENOUS
  Filled 2017-07-04: qty 1000

## 2017-07-04 MED ORDER — ONDANSETRON HCL 4 MG/2ML IJ SOLN
8.0000 mg | Freq: Once | INTRAMUSCULAR | Status: AC
  Administered 2017-07-04: 8 mg via INTRAVENOUS

## 2017-07-04 NOTE — Progress Notes (Signed)
[place] NOTE  [name], [name], MD (Internal Medicine) Bary Castilla, Forest Gleason, [name], RN as Registered Nurse [name], MD as Consulting Physician (Internal Medicine)  CHIEF COMPLAINTS/PURPOSE OF CONSULTATION:   Oncology History   # MARCH/APRIL 2017-NEUROENDOCRINE CA STAGE IV; [name]; [name]; [name]; [name]; [name]; [name]; [name]; July 14th  2017 CT- PR  # AUG 28th- FOLFIRI +Avastin; OCT 5th CT scan- PR.   # MARCH 20th- CT STABLE liver lesions; ? Enlarging cecal lesion [CEA rising]; March 26th- FOLFOX [last avastin march 12th 2018];   # resection of primary tumor [Dr.Byrnett]; y90- June 19th 2018.  # July 11th-FOLFOX + Avastin; SEP 2018 [CT- stable liver lesions; increasing ~51mm RP LN]; RISING CEA [stopped end of Sep 2018 ]  # OCT 15th 2018- Vemu+ Iri+ Pan [mid nov stopped iri- neutropenia/dia+vemu- arthralgias]; 05/11/2017-Started enco+Bini+vectibex   # [facility]- B-RAF [id number]- MUTATED [May 2018]  # right kidney lesion 2.6 x 1.6 cm ? Angiolipoma [followed by Urology in past]     Malignant neoplasm of ascending colon ([place])     HISTORY OF PRESENTING ILLNESS:  Kathleen James 67 y.o.  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    Patient is currently on Enco+bini [reduced by 25% given the increasing CEA; and improved creatinine].  The  Patient continues to have rash on the torso face back.  Itchy.  She has been using triamcinolone ointment; also on minocycline.  Using Kenalog.  Patient's diarrhea has improved.  Denies any shortness of breath or chest pain.  Mild fatigue.  No swelling in the legs.  ROS: A complete 10 point review of system is done which is negative except mentioned above in history of present illness  MEDICAL HISTORY:  Past Medical History:  Diagnosis Date  . Angiolipoma of kidney    followed with serial CTs ,,  Dr. Jacqlyn Larsen  . Anxiety   . Arthritis   . 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 Chemotherapy induced diarrhea   . Hammer toe   . Hypertension   . Liver cancer ([place])   . Neuro-endocrine carcinoma ([place])   . Obesity (BMI 30-39.9)     SURGICAL HISTORY: Past Surgical History:  Procedure Laterality Date  . ABDOMINAL HYSTERECTOMY  1995   endometriosis, heavy bleeding  . BUNIONECTOMY Bilateral   . CESAREAN SECTION  1985  . COLONOSCOPY WITH PROPOFOL N/A 10/09/2015   Procedure: COLONOSCOPY WITH PROPOFOL;  Surgeon: Robert Bellow, MD;  Location[facility] ENDOSCOPY;  Service: Endoscopy;  Laterality: N/A;  . IR ANGIOGRAM SELECTIVE EACH ADDITIONAL VESSEL  11/22/2016  . IR ANGIOGRAM SELECTIVE EACH ADDITIONAL VESSEL  11/22/2016  . IR ANGIOGRAM SELECTIVE EACH ADDITIONAL VESSEL  11/22/2016  . IR ANGIOGRAM SELECTIVE EACH ADDITIONAL VESSEL  12/07/2016  . IR ANGIOGRAM SELECTIVE EACH ADDITIONAL VESSEL  12/07/2016  . IR ANGIOGRAM VISCERAL SELECTIVE  11/22/2016  . IR ANGIOGRAM VISCERAL SELECTIVE  12/07/2016  . IR EMBO ARTERIAL NOT HEMORR HEMANG INC GUIDE ROADMAPPING  11/22/2016  . IR EMBO TUMOR ORGAN ISCHEMIA INFARCT INC GUIDE  ROADMAPPING  12/07/2016  . IR GENERIC HISTORICAL  04/07/2016   IR RADIOLOGIST EVAL & MGMT 04/07/2016 Arne Cleveland, MD [facility] INTERV RAD  . IR RADIOLOGIST EVAL & MGMT  04/14/2017  . IR US GUIDE VASC ACCESS RIGHT  11/22/2016  . IR US GUIDE VASC ACCESS RIGHT  12/07/2016  . LAPAROSCOPIC RIGHT COLECTOMY Right 10/26/2016   Procedure: LAPAROSCOPIC RIGHT COLECTOMY;  Surgeon: Robert Bellow, MD;  Location: [facility] ORS;  Service:  General;  Laterality: Right;  . OOPHORECTOMY    . PORTACATH PLACEMENT Left 10/01/2015   Procedure: INSERTION PORT-A-CATH;  Surgeon: Robert Bellow, MD;  Location: [facility] ORS;  Service: General;  Laterality: Left;  . ROTATOR CUFF REPAIR Right 2008   right shoulder.  [place]     SOCIAL HISTORY: She lives at home with her family in [place]. 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 Social History   Socioeconomic History  . Marital status: Married    Spouse name: Not on file  . Number of children: Not on file  . Years of education: Not on file  . Highest education level: Not on file  Social Needs  . Financial resource strain: Not on file  . Food insecurity - worry: Not on file  . Food insecurity - inability: Not on file  . Transportation needs - medical: Not on file  . Transportation needs - non-medical: Not on file  Occupational History  . Not on file  Tobacco Use  . Smoking status: Never Smoker  . Smokeless tobacco: Never Used  Substance and Sexual Activity  . Alcohol use: Yes    Alcohol/week: 0.0 oz    Comment: occasionally  . Drug use: No  . Sexual activity: Not Currently  Other Topics Concern  . Not on file  Social History Narrative  . Not on file    FAMILY HISTORY: Family History  Problem Relation Age of Onset  . Mental illness Mother 27       bipolar, dementia  . Cancer Maternal Aunt 70       breast ca  . Hypertension Father   . Stroke Father 65       deceased  . COPD Father   . Heart disease Sister        deceased    ALLERGIES:  is allergic to bee venom; dilaudid [hydromorphone hcl]; and morphine and related.  MEDICATIONS:  Current Outpatient Medications  Medication Sig Dispense Refill  . ALPRAZolam (XANAX) 0.25 MG tablet Take 1 tablet (0.25 mg total) by mouth 2 (two) times daily as needed for anxiety. 60 tablet 1  . amLODipine (NORVASC) 5 MG tablet Take 1 tablet (5 mg total) daily by mouth. 30 tablet 4  .  Binimetinib 15 MG TABS Take 45 mg every 12 (twelve) hours by mouth. (Patient taking differently: Take 30 mg by mouth every 12 (twelve) hours. ) 180 tablet 4  . Biotin w/ Vitamins C & E (HAIR/SKIN/NAILS PO) Take 1 tablet by mouth daily.    . Chlorpheniramine Maleate (CHLOR-TABLETS PO) Take 1 tablet by mouth every evening.    . clindamycin (CLINDAGEL) 1 % gel Apply topically 2 (two) times daily. Apply to affected areas twice a day. 30 g 0  . diphenoxylate-atropine (LOMOTIL) 2.5-0.025 MG tablet TAKE 1 TABLET FOUR TIMES DAILY AS NEEDEDFOR DIARRHEA OR LOOSE STOOLS 30 tablet 3  . Encorafenib 75 MG CAPS Take 300 mg daily by mouth. (Patient taking differently: Take 225 mg  by mouth daily. ) 120 capsule 4  . lidocaine-prilocaine (EMLA) cream Apply 1 application topically as needed. Apply to port a cath site 1 hour prior to chemotherapy treatments 30 g 6  . loperamide (IMODIUM A-D) 2 MG tablet Take 2 mg by mouth 4 (four) times daily as needed for diarrhea or loose stools.    . metoprolol tartrate (LOPRESSOR) 25 MG tablet Take 1 tablet (25 mg total) by mouth 2 (two) times daily. 60 tablet 3  . minocycline (MINOCIN) 100 MG capsule Take 1 capsule (100 mg total) by mouth daily. 60 capsule 1  . ondansetron (ZOFRAN) 8 MG tablet Take 1 tablet (8 mg total) by mouth every 8 (eight) hours as needed for nausea or vomiting. 40 tablet 3  . potassium chloride (K-DUR) 10 MEQ tablet TAKE ONE TABLET BY MOUTH TWICE DAILY 60 tablet 3  . triamcinolone ointment (KENALOG) 0.5 % Apply 1 application topically 2 (two) times daily. To rash 30 g 0  . naproxen sodium (ANAPROX) 220 MG tablet Take 440 mg by mouth 2 (two) times daily as needed.    . prochlorperazine (COMPAZINE) 10 MG tablet TAKE ONE TABLET BY MOUTH EVERY 6 HOURS AS NEEDED FOR NAUSEA OR VOMITING (Patient not taking: Reported on 06/28/2017) 30 tablet 2   No current facility-administered medications for this visit.    Facility-Administered Medications Ordered in Other Visits   Medication Dose Route Frequency Provider Last Rate Last Dose  . heparin lock flush 100 unit/mL  500 Units Intravenous Once Charlaine Dalton R, MD      . heparin lock flush 100 unit/mL  500 Units Intravenous Once Charlaine Dalton R, MD      . sodium chloride flush (NS) 0.9 % injection 10 mL  10 mL Intravenous PRN [name], MD   10 mL at 11/24/15 0836  . sodium chloride flush (NS) 0.9 % injection 10 mL  10 mL Intravenous PRN [name], MD   10 mL at 07/04/17 0905      .  PHYSICAL EXAMINATION: ECOG PERFORMANCE STATUS: 0 - Asymptomatic  Vitals:   07/04/17 0925  BP: 137/85  Pulse: 76  Temp: 97.9 F (36.6 C)   Filed Weights   07/04/17 0925  Weight: 202 lb 4.8 oz (91.8 kg)    GENERAL: Well-nourished well-developed; Alert, no distress and comfortable.  She is alone. EYES: no pallor or icterus OROPHARYNX: no thrush or ulceration; good dentition  NECK: supple, no masses felt LYMPH:  no palpable lymphadenopathy in the cervical, axillary or inguinal regions LUNGS: clear to auscultation and  No wheeze or crackles HEART/CVS: regular rate & rhythm and no murmurs; No lower extremity edema ABDOMEN: abdomen soft, non-tender and normal bowel sounds; positive for hepatomegaly. Musculoskeletal:no cyanosis of digits and no clubbing  PSYCH: alert & oriented x 3 with fluent speech NEURO: no focal motor/sensory deficits SKIN:  Acne-like rash noted on the torso/face.  LABORATORY DATA:  I have reviewed the data as listed Lab Results  Component Value Date   WBC 6.5 07/04/2017   HGB 11.8 (L) 07/04/2017   HCT 35.4 07/04/2017   MCV 99.3 07/04/2017   PLT 306 07/04/2017   Recent Labs    06/20/17 0928 06/28/17 1431 07/04/17 0909  NA 136 138 136  K 3.8 4.1 3.9  CL 105 104 107  CO2 24 27 21*  GLUCOSE 91 97 92  BUN 18 22* 24*  CREATININE 1.55* 1.41* 1.87*  CALCIUM 8.4* 8.3* 8.5*  GFRNONAA 34* 38* 27*  GFRAA 39* 44* 31*  PROT 7.0 7.3 7.0  ALBUMIN 2.7* 2.8*  3.0*  AST 85* 37 34  ALT 53 28 24  ALKPHOS 219* 184* 173*  BILITOT 0.4 0.3 0.6    RADIOGRAPHIC STUDIES: I have personally reviewed the radiological images as listed and agreed with the findings in the report. No results found.  Results for Kathleen, James (MRN [id number]) as of 07/04/2017 09:30  Ref. Range 09/23/2015 15:04 11/03/2015 08:34 12/10/2015 08:51 12/24/2015 08:50 01/07/2016 09:07 02/02/2016 09:03 03/01/2016 11:00 03/29/2016 09:06 04/26/2016 10:31 05/24/2016 09:50 06/28/2016 08:32 08/09/2016 09:18 08/30/2016 10:13 09/13/2016 09:35 09/27/2016 09:35 10/11/2016 08:38 10/25/2016 08:19 11/04/2016 10:09 11/17/2016 08:29 12/29/2016 11:32 01/12/2017 09:29 01/26/2017 09:31 02/09/2017 09:02 02/28/2017 09:04 03/14/2017 11:30 03/23/2017 09:40 04/18/2017 10:15 04/25/2017 13:00 05/02/2017 08:25 05/16/2017 08:53 05/25/2017 10:50 05/31/2017 12:20 12020-03-2217 11:52 06/28/2017 14:31  CEA Latest Ref Range: 0.0 - 4.7 ng/mL 538.7 (H) 860.5 (H) 1,167.0 (H) 658.6 (H) 415.6 (H) 242.1 (H) 173.6 (H) 118.8 (H) 100.6 (H) 91.5 (H) 94.5 (H) 141.5 (H) 181.2 (H) 197.3 (H) 215.3 (H) 254.9 (H) 300.7 (H) 254.9 (H) 268.6 (H)                 CEA Latest Ref Range: 0.0 - 4.7 ng/mL                    638.7 (H) 521.8 (H) 398.7 (H) 386.8 (H) 422.2 (H) 529.1 (H) 537.2 (H) 397.9 (H) 263.6 (H) 155.8 (H) 213.0 (H) 132.2 (H) 87.9 (H) 57.0 (H) 164.5 (H)     ASSESSMENT & PLAN:   .Malignant neoplasm of ascending colon ([facility]) 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 CT Sep 10th- improved metastatic lesions to the liver; slight increase in retroperitoneal lymph nodes from 8-9 mm in size.   # Patient currently on Vectibex [cut dose to 4 mg/kg]continue Enco 3 pills/ Bini 2 pills BID.  # Skin rash- sec to vectibex; improved. Continue minocycline; clindamycin gel. Recommend adding kenalog ointment.  # acute renal failure- ? Etiology- creat 1.8;  US kidney-NEG ? Sec to  [facility]; awaiting appt with Dr.Singh next week.,  # diarrhea-improved. Recommend continued Lomotil as needed.  # follow up in 2 weeks/CEA/vectibex; mag PET prior.     [name], MD 07/04/2017 9:48 AM

## 2017-07-04 NOTE — Telephone Encounter (Signed)
Oral Oncology Patient Advocate Encounter  Received notification from [employer] Patient Assistance program that patient has been successfully enrolled into their program to receive Braftovi and Mektovi from the manufacturer at $0 out of pocket until 06/20/2018.   I called and spoke with patient.  She knows we will have to re-apply.   Patient knows to call the office with questions or concerns.  Oral Oncology Clinic will continue to follow.  [place] Patient Advocate [phone] 07/04/2017 1:52 PM

## 2017-07-04 NOTE — Assessment & Plan Note (Addendum)
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 CT Sep 10th- improved metastatic lesions to the liver; slight increase in retroperitoneal lymph nodes from 8-9 mm in size.   # Patient currently on Vectibex [cut dose to 4 mg/kg]continue Enco 3 pills/ Bini 2 pills BID.  # Skin rash- sec to vectibex; improved. Continue minocycline; clindamycin gel. Recommend adding kenalog ointment.  # acute renal failure- ? Etiology- creat 1.8;  US kidney-NEG ? Sec to [facility]; awaiting appt with Dr.Singh next week.,  # diarrhea-improved. Recommend continued Lomotil as needed.  # follow up in 2 weeks/CEA/vectibex; mag PET prior.

## 2017-07-05 LAB — CEA: CEA: 224 ng/mL — ABNORMAL HIGH (ref 0.0–4.7)

## 2017-07-06 NOTE — Telephone Encounter (Signed)
Oral Oncology Patient Advocate Encounter   Called to check on patients medication being shipped. Called [facility] and they have been trying to get in touch with her. Called her and left a message to please call them [phone].   [place] Patient Advocate [phone] 07/06/2017 10:44 AM

## 2017-07-08 NOTE — Telephone Encounter (Signed)
Oral Oncology Patient Advocate Encounter   Patients Braftovi & Irean Hong was shipped yesterday to arrive today 07/08/2017.   [place] Patient Advocate [phone] 07/08/2017 3:21 PM

## 2017-07-11 DIAGNOSIS — N179 Acute kidney failure, unspecified: Secondary | ICD-10-CM | POA: Diagnosis not present

## 2017-07-11 DIAGNOSIS — N183 Chronic kidney disease, stage 3 (moderate): Secondary | ICD-10-CM | POA: Diagnosis not present

## 2017-07-13 ENCOUNTER — Encounter: Payer: Self-pay | Admitting: Internal Medicine

## 2017-07-14 ENCOUNTER — Encounter
Admission: RE | Admit: 2017-07-14 | Discharge: 2017-07-14 | Disposition: A | Discharge status: Home / Self Care | Payer: Medicare HMO | Source: Ambulatory Visit | Attending: Internal Medicine | Admitting: Internal Medicine

## 2017-07-14 DIAGNOSIS — C189 Malignant neoplasm of colon, unspecified: Secondary | ICD-10-CM | POA: Diagnosis not present

## 2017-07-14 DIAGNOSIS — C182 Malignant neoplasm of ascending colon: Secondary | ICD-10-CM

## 2017-07-14 LAB — GLUCOSE, CAPILLARY: Glucose-Capillary: 74 mg/dL (ref 65–99)

## 2017-07-14 MED ORDER — FLUDEOXYGLUCOSE F - 18 (FDG) INJECTION
12.0000 | Freq: Once | INTRAVENOUS | Status: AC | PRN
  Administered 2017-07-14: 11.83 via INTRAVENOUS

## 2017-07-15 ENCOUNTER — Telehealth: Payer: Self-pay | Admitting: Internal Medicine

## 2017-07-15 NOTE — Telephone Encounter (Signed)
Spoke to pt re: results of PET; follow up as planned.

## 2017-07-18 ENCOUNTER — Inpatient Hospital Stay: Payer: Medicare HMO

## 2017-07-18 ENCOUNTER — Encounter: Payer: Self-pay | Admitting: Internal Medicine

## 2017-07-18 ENCOUNTER — Inpatient Hospital Stay (HOSPITAL_BASED_OUTPATIENT_CLINIC_OR_DEPARTMENT_OTHER): Payer: Medicare HMO | Admitting: Internal Medicine

## 2017-07-18 ENCOUNTER — Telehealth: Payer: Self-pay | Admitting: Internal Medicine

## 2017-07-18 VITALS — BP 140/96 | HR 82 | Temp 95.3°F | Wt 193.8 lb

## 2017-07-18 DIAGNOSIS — R590 Localized enlarged lymph nodes: Secondary | ICD-10-CM

## 2017-07-18 DIAGNOSIS — R112 Nausea with vomiting, unspecified: Secondary | ICD-10-CM | POA: Diagnosis not present

## 2017-07-18 DIAGNOSIS — Z5112 Encounter for antineoplastic immunotherapy: Secondary | ICD-10-CM | POA: Diagnosis not present

## 2017-07-18 DIAGNOSIS — Z79899 Other long term (current) drug therapy: Secondary | ICD-10-CM

## 2017-07-18 DIAGNOSIS — D1771 Benign lipomatous neoplasm of kidney: Secondary | ICD-10-CM | POA: Diagnosis not present

## 2017-07-18 DIAGNOSIS — R21 Rash and other nonspecific skin eruption: Secondary | ICD-10-CM | POA: Diagnosis not present

## 2017-07-18 DIAGNOSIS — C182 Malignant neoplasm of ascending colon: Secondary | ICD-10-CM

## 2017-07-18 DIAGNOSIS — T451X5S Adverse effect of antineoplastic and immunosuppressive drugs, sequela: Secondary | ICD-10-CM | POA: Diagnosis not present

## 2017-07-18 DIAGNOSIS — R5383 Other fatigue: Secondary | ICD-10-CM

## 2017-07-18 DIAGNOSIS — I10 Essential (primary) hypertension: Secondary | ICD-10-CM

## 2017-07-18 DIAGNOSIS — C787 Secondary malignant neoplasm of liver and intrahepatic bile duct: Secondary | ICD-10-CM

## 2017-07-18 DIAGNOSIS — L270 Generalized skin eruption due to drugs and medicaments taken internally: Secondary | ICD-10-CM | POA: Diagnosis not present

## 2017-07-18 DIAGNOSIS — N179 Acute kidney failure, unspecified: Secondary | ICD-10-CM | POA: Diagnosis not present

## 2017-07-18 DIAGNOSIS — M1990 Unspecified osteoarthritis, unspecified site: Secondary | ICD-10-CM | POA: Diagnosis not present

## 2017-07-18 LAB — COMPREHENSIVE METABOLIC PANEL
ALK PHOS: 158 U/L — AB (ref 38–126)
ALT: 24 U/L (ref 14–54)
AST: 32 U/L (ref 15–41)
Albumin: 2.8 g/dL — ABNORMAL LOW (ref 3.5–5.0)
Anion gap: 9 (ref 5–15)
BUN: 27 mg/dL — AB (ref 6–20)
CALCIUM: 8.5 mg/dL — AB (ref 8.9–10.3)
CO2: 19 mmol/L — AB (ref 22–32)
CREATININE: 1.83 mg/dL — AB (ref 0.44–1.00)
Chloride: 107 mmol/L (ref 101–111)
GFR calc non Af Amer: 28 mL/min — ABNORMAL LOW (ref >60)
GFR, EST AFRICAN AMERICAN: 32 mL/min — AB (ref >60)
Glucose, Bld: 82 mg/dL (ref 65–99)
Potassium: 3.9 mmol/L (ref 3.5–5.1)
SODIUM: 135 mmol/L (ref 135–145)
Total Bilirubin: 0.4 mg/dL (ref 0.3–1.2)
Total Protein: 6.5 g/dL (ref 6.5–8.1)

## 2017-07-18 LAB — CBC WITH DIFFERENTIAL/PLATELET
BASOS PCT: 1 %
Basophils Absolute: 0 10*3/uL (ref 0–0.1)
EOS ABS: 0.2 10*3/uL (ref 0–0.7)
EOS PCT: 3 %
HCT: 35.8 % (ref 35.0–47.0)
HEMOGLOBIN: 11.9 g/dL — AB (ref 12.0–16.0)
Lymphocytes Relative: 12 %
Lymphs Abs: 0.8 10*3/uL — ABNORMAL LOW (ref 1.0–3.6)
MCH: 32.7 pg (ref 26.0–34.0)
MCHC: 33.1 g/dL (ref 32.0–36.0)
MCV: 98.6 fL (ref 80.0–100.0)
MONO ABS: 0.6 10*3/uL (ref 0.2–0.9)
MONOS PCT: 8 %
NEUTROS PCT: 76 %
Neutro Abs: 5.1 10*3/uL (ref 1.4–6.5)
PLATELETS: 256 10*3/uL (ref 150–440)
RBC: 3.63 MIL/uL — ABNORMAL LOW (ref 3.80–5.20)
RDW: 16.9 % — AB (ref 11.5–14.5)
WBC: 6.8 10*3/uL (ref 3.6–11.0)

## 2017-07-18 LAB — MAGNESIUM: MAGNESIUM: 1.4 mg/dL — AB (ref 1.7–2.4)

## 2017-07-18 MED ORDER — SODIUM CHLORIDE 0.9 % IV SOLN
Freq: Once | INTRAVENOUS | Status: AC
  Administered 2017-07-18: 8 mg via INTRAVENOUS
  Filled 2017-07-18: qty 4

## 2017-07-18 MED ORDER — SODIUM CHLORIDE 0.9 % IV SOLN
Freq: Once | INTRAVENOUS | Status: AC
  Administered 2017-07-18: 10:00:00 via INTRAVENOUS
  Filled 2017-07-18: qty 1000

## 2017-07-18 MED ORDER — HEPARIN SOD (PORK) LOCK FLUSH 100 UNIT/ML IV SOLN: 500.0000 [IU] | Freq: Once | INTRAVENOUS | Status: DC

## 2017-07-18 MED ORDER — SODIUM CHLORIDE 0.9% FLUSH
10.0000 mL | INTRAVENOUS | Status: DC | PRN
  Administered 2017-07-18: 10 mL via INTRAVENOUS
  Filled 2017-07-18: qty 10

## 2017-07-18 MED ORDER — SODIUM CHLORIDE 0.9 % IV SOLN
4.0000 mg/kg | Freq: Once | INTRAVENOUS | Status: AC
  Administered 2017-07-18: 360 mg via INTRAVENOUS
  Filled 2017-07-18: qty 18

## 2017-07-18 MED ORDER — HEPARIN SOD (PORK) LOCK FLUSH 100 UNIT/ML IV SOLN
500.0000 [IU] | Freq: Once | INTRAVENOUS | Status: AC | PRN
  Administered 2017-07-18: 500 [IU]
  Filled 2017-07-18: qty 5

## 2017-07-18 NOTE — Assessment & Plan Note (Addendum)
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 Jan 2019-PET - improved metastatic lesions to the liver; improved in retroperitoneal lymph nodes 28mm in size.   # Patient currently on Vectibex [cut dose to 4 mg/kg] continue Enco 3 pills/ Bini 2 pills BID.  Patient tolerating treatment fairly well except for nausea/also renal failure [see discussion below].  #Hypomagnesemia magnesium 1.4; secondary to vectibex; will start supplementing at next visit.  # Skin rash- sec to vectibex; improved/stable continue minocycline; clindamycin gel.   # acute renal failure- ? Etiology- creat 1.8;  US kidney-NEG ? Sec to [facility].  Status post evaluation with nephrology; Dr. Candiss Norse.  # diarrhea-improved. Recommend continued Lomotil as needed.  # follow up in 2 weeks/CEA/vectibex; mag infusion  Cc; Dr.Singh.

## 2017-07-18 NOTE — Progress Notes (Signed)
[place] NOTE  [name], [name], MD (Internal Medicine) Bary Castilla, Forest Gleason, [name], RN as Registered Nurse [name], MD as Consulting Physician (Internal Medicine)  CHIEF COMPLAINTS/PURPOSE OF CONSULTATION:   Oncology History   # MARCH/APRIL 2017-NEUROENDOCRINE CA STAGE IV; [name]; [name]; [name]; [name]; [name]; [name]; [name]; July 14th  2017 CT- PR  # AUG 28th- FOLFIRI +Avastin; OCT 5th CT scan- PR.   # MARCH 20th- CT STABLE liver lesions; ? Enlarging cecal lesion [CEA rising]; March 26th- FOLFOX [last avastin march 12th 2018];   # resection of primary tumor [Dr.Byrnett]; y90- June 19th 2018.  # July 11th-FOLFOX + Avastin; SEP 2018 [CT- stable liver lesions; increasing ~45mm RP LN]; RISING CEA [stopped end of Sep 2018 ]  # OCT 15th 2018- Vemu+ Iri+ Pan [mid nov stopped iri- neutropenia/dia+vemu- arthralgias]; 05/11/2017-Started enco+Bini+vectibex   # [facility]- B-RAF V600E- MUTATED [May 2018]  # right kidney lesion 2.6 x 1.6 cm ? Angiolipoma [followed by Urology in past]     Malignant neoplasm of ascending colon ([place])     HISTORY OF PRESENTING ILLNESS:  Kathleen James 67 y.o.  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  Rash is better controlled.  She has intermittent nausea; with mild vomiting. Mild fatigue.  Not any worse.   Patient's diarrhea has improved. Denies any shortness of breath or chest  pain.No swelling in the legs.  ROS: A complete 10 point review of system is done which is negative except mentioned above in history of present illness  MEDICAL HISTORY:  Past Medical History:  Diagnosis Date  . Angiolipoma of kidney    followed with serial CTs ,,  Dr. Jacqlyn Larsen  . Anxiety   . Arthritis   . 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 Chemotherapy induced diarrhea   . Hammer toe   . Hypertension   . Liver cancer ([place])   . Neuro-endocrine carcinoma ([place])   . Obesity (BMI 30-39.9)     SURGICAL HISTORY: Past Surgical History:  Procedure Laterality Date  . ABDOMINAL HYSTERECTOMY  1995   endometriosis, heavy bleeding  . BUNIONECTOMY Bilateral   . CESAREAN SECTION  1985  . COLONOSCOPY WITH PROPOFOL N/A 10/09/2015   Procedure: COLONOSCOPY WITH PROPOFOL;  Surgeon: Robert Bellow, MD;  [facility] END[facility]COPY;  Service: Endoscopy;  Laterality: N/A;  . IR ANGIOGRAM SELECTIVE EACH ADDITIONAL VESSEL  11/22/2016  . IR ANGIOGRAM SELECTIVE EACH ADDITIONAL VESSEL  11/22/2016  . IR ANGIOGRAM SELECTIVE EACH ADDITIONAL VESSEL  11/22/2016  . IR ANGIOGRAM SELECTIVE EACH ADDITIONAL VESSEL  12/07/2016  . IR ANGIOGRAM SELECTIVE EACH ADDITIONAL VESSEL  12/07/2016  . IR ANGIOGRAM VISCERAL SELECTIVE  11/22/2016  . IR ANGIOGRAM VISCERAL SELECTIVE  12/07/2016  . IR EMBO ARTERIAL NOT HEMORR HEMANG INC GUIDE ROADMAPPING  11/22/2016  . IR EMBO TUMOR ORGAN ISCHEMIA INFARCT INC GUIDE ROADMAPPING  12/07/2016  . IR GENERIC HISTORICAL  04/07/2016   IR RADIOLOGIST  EVAL & MGMT 04/07/2016 Arne Cleveland, MD [facility] INTERV RAD  . IR RADIOLOGIST EVAL & MGMT  04/14/2017  . IR US GUIDE VASC ACCESS RIGHT  11/22/2016  . IR US GUIDE VASC ACCESS RIGHT  12/07/2016  . LAPAROSCOPIC RIGHT COLECTOMY Right 10/26/2016   Procedure: LAPAROSCOPIC RIGHT COLECTOMY;  Surgeon: Robert Bellow, MD;  Location: [facility] ORS;  Service: General;  Laterality: Right;  . OOPHORECTOMY    .  PORTACATH PLACEMENT Left 10/01/2015   Procedure: INSERTION PORT-A-CATH;  Surgeon: Robert Bellow, MD;  Location: [facility] ORS;  Service: General;  Laterality: Left;  . ROTATOR CUFF REPAIR Right 2008   right shoulder.  [place]     SOCIAL HISTORY: She lives at home with her family in [place]. 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 Social History   Socioeconomic History  . Marital status: Married    Spouse name: Not on file  . Number of children: Not on file  . Years of education: Not on file  . Highest education level: Not on file  Social Needs  . Financial resource strain: Not on file  . Food insecurity - worry: Not on file  . Food insecurity - inability: Not on file  . Transportation needs - medical: Not on file  . Transportation needs - non-medical: Not on file  Occupational History  . Not on file  Tobacco Use  . Smoking status: Never Smoker  . Smokeless tobacco: Never Used  Substance and Sexual Activity  . Alcohol use: Yes    Alcohol/week: 0.0 oz    Comment: occasionally  . Drug use: No  . Sexual activity: Not Currently  Other Topics Concern  . Not on file  Social History Narrative  . Not on file    FAMILY HISTORY: Family History  Problem Relation Age of Onset  . Mental illness Mother 16       bipolar, dementia  . Cancer Maternal Aunt 70       breast ca  . Hypertension Father   . Stroke Father 43       deceased  . COPD Father   . Heart disease Sister        deceased    ALLERGIES:  is allergic to bee venom; dilaudid [hydromorphone hcl]; and morphine and related.  MEDICATIONS:  Current Outpatient Medications  Medication Sig Dispense Refill  . ALPRAZolam (XANAX) 0.25 MG tablet Take 1 tablet (0.25 mg total) by mouth 2 (two) times daily as needed for anxiety. 60 tablet 1  . Binimetinib 15 MG TABS Take 45 mg every 12 (twelve) hours by mouth. (Patient taking differently: Take 30 mg by mouth every 12 (twelve) hours. ) 180  tablet 4  . Chlorpheniramine Maleate (CHLOR-TABLETS PO) Take 1 tablet by mouth every evening.    . clindamycin (CLINDAGEL) 1 % gel Apply topically 2 (two) times daily. Apply to affected areas twice a day. 30 g 0  . diphenoxylate-atropine (LOMOTIL) 2.5-0.025 MG tablet TAKE 1 TABLET FOUR TIMES DAILY AS NEEDEDFOR DIARRHEA OR LOOSE STOOLS 30 tablet 3  . Encorafenib 75 MG CAPS Take 300 mg daily by mouth. 120 capsule 4  . lidocaine-prilocaine (EMLA) cream Apply 1 application topically as needed. Apply to port a cath site 1 hour prior to chemotherapy treatments 30 g 6  . loperamide (IMODIUM A-D) 2 MG tablet Take 2 mg by mouth 4 (four) times daily as needed for diarrhea or loose stools.    Marland Kitchen  metoprolol tartrate (LOPRESSOR) 25 MG tablet Take 1 tablet (25 mg total) by mouth 2 (two) times daily. 60 tablet 3  . minocycline (MINOCIN) 100 MG capsule Take 1 capsule (100 mg total) by mouth daily. 60 capsule 1  . ondansetron (ZOFRAN) 8 MG tablet Take 1 tablet (8 mg total) by mouth every 8 (eight) hours as needed for nausea or vomiting. 40 tablet 3  . potassium chloride (K-DUR) 10 MEQ tablet TAKE ONE TABLET BY MOUTH TWICE DAILY 60 tablet 3  . prochlorperazine (COMPAZINE) 10 MG tablet TAKE ONE TABLET BY MOUTH EVERY 6 HOURS AS NEEDED FOR NAUSEA OR VOMITING 30 tablet 2  . triamcinolone ointment (KENALOG) 0.5 % Apply 1 application topically 2 (two) times daily. To rash 30 g 0  . amLODipine (NORVASC) 5 MG tablet Take 1 tablet (5 mg total) daily by mouth. (Patient not taking: Reported on 07/18/2017) 30 tablet 4  . Biotin w/ Vitamins C & E (HAIR/SKIN/NAILS PO) Take 1 tablet by mouth daily.    . naproxen sodium (ANAPROX) 220 MG tablet Take 440 mg by mouth 2 (two) times daily as needed.     No current facility-administered medications for this visit.    Facility-Administered Medications Ordered in Other Visits  Medication Dose Route Frequency Provider Last Rate Last Dose  . heparin lock flush 100 unit/mL  500 Units  Intravenous Once Charlaine Dalton R, MD      . sodium chloride flush (NS) 0.9 % injection 10 mL  10 mL Intravenous PRN [name], MD   10 mL at 11/24/15 0836      .  PHYSICAL EXAMINATION: ECOG PERFORMANCE STATUS: 0 - Asymptomatic  Vitals:   07/18/17 0847  BP: (!) 140/96  Pulse: 82  Temp: (!) 95.3 F (35.2 C)   Filed Weights   07/18/17 0847  Weight: 193 lb 12.8 oz (87.9 kg)    GENERAL: Well-nourished well-developed; Alert, no distress and comfortable.  She is accompanied by her daughter EYES: no pallor or icterus OROPHARYNX: no thrush or ulceration; good dentition  NECK: supple, no masses felt LYMPH:  no palpable lymphadenopathy in the cervical, axillary or inguinal regions LUNGS: clear to auscultation and  No wheeze or crackles HEART/CVS: regular rate & rhythm and no murmurs; No lower extremity edema ABDOMEN: abdomen soft, non-tender and normal bowel sounds; positive for hepatomegaly. Musculoskeletal:no cyanosis of digits and no clubbing  PSYCH: alert & oriented x 3 with fluent speech NEURO: no focal motor/sensory deficits SKIN:  Acne-like rash noted on the torso/face.  LABORATORY DATA:  I have reviewed the data as listed Lab Results  Component Value Date   WBC 6.8 07/18/2017   HGB 11.9 (L) 07/18/2017   HCT 35.8 07/18/2017   MCV 98.6 07/18/2017   PLT 256 07/18/2017   Recent Labs    06/28/17 1431 07/04/17 0909 07/18/17 0804  NA 138 136 135  K 4.1 3.9 3.9  CL 104 107 107  CO2 27 21* 19*  GLUCOSE 97 92 82  BUN 22* 24* 27*  CREATININE 1.41* 1.87* 1.83*  CALCIUM 8.3* 8.5* 8.5*  GFRNONAA 38* 27* 28*  GFRAA 44* 31* 32*  PROT 7.3 7.0 6.5  ALBUMIN 2.8* 3.0* 2.8*  AST 37 34 32  ALT 28 24 24   ALKPHOS 184* 173* 158*  BILITOT 0.3 0.6 0.4    RADIOGRAPHIC STUDIES: I have personally reviewed the radiological images as listed and agreed with the findings in the report. Nm Pet Image Restag (ps) Skull Base To Thigh  Result Date: 07/14/2017 CLINICAL  DATA:  Subsequent treatment strategy for right colon cancer. EXAM: NUCLEAR MEDICINE PET SKULL BASE TO THIGH TECHNIQUE: 11.8 mCi F-18 FDG was injected intravenously. Full-ring PET imaging was performed from the skull base to thigh after the radiotracer. CT data was obtained and used for attenuation correction and anatomic localization. FASTING BLOOD GLUCOSE:  Value: 70 for mg/dl COMPARISON:  CT chest/abdomen/pelvis on 02/28/2017. PET-CT 10/06/2015. FINDINGS: NECK: No hypermetabolic lymph nodes in the neck. CHEST: No hypermetabolic mediastinal or hilar nodes. No suspicious pulmonary nodules on the CT scan. Right Port-A-Cath tip is at the SVC/RA junction. ABDOMEN/PELVIS: Dominant hepatic lesion in the dome of the liver has continued to decrease in size measuring 2.6 x 2.1 cm today compared to 4.3 x 3.0 cm on the study from 02/28/2017. PET imaging shows no hypermetabolism within the lesion itself although the surrounding parenchyma has minimally increased FDG accumulation relative to background liver parenchyma and is likely reactive. Additional hepatic metastases seen on the previous PET-CT have resolved completely with no discernible lesions on noncontrast CT and no uptake above background liver uptake at these locations. The additional scattered cysts and cavernous hemangioma documented previously are again noted. The ileocolic lymphadenopathy seen on prior PET-CT has resolved or been surgically resected with no ileocolic hypermetabolic lymphadenopathy on the current exam. CT scan from September documented enlarging aortocaval lymph node which has decreased in the interval measuring 6 mm on image 149 of series 3 today compared to 9 mm on that study. There is no hypermetabolism discernible within this lymph node on today's exam. No other hypermetabolic lymphadenopathy in the abdomen or pelvis on the current study. Right hemicolectomy.  Abdominal aortic atherosclerosis. SKELETON: No focal hypermetabolic activity to  suggest skeletal metastasis. IMPRESSION: 1. Continued further decrease in size of the dominant posterior right hepatic dome lesion demonstrating no hypermetabolic FDG accumulation above background parenchymal levels on today's study. No other hypermetabolic liver metastases are evident on the current exam. 2. No hypermetabolic lymphadenopathy in the chest, abdomen, or pelvis to suggest distant hypermetabolic metastatic involvement. The aortocaval lymph node in question on the recent diagnostic CT scan has decreased in size in the interval. 3.  Aortic Atherosclerois (ICD10-170.0) Electronically Signed   By: Misty Stanley M.D.   On: 07/14/2017 10:36    Results for DAYAN, KREIS (MRN [id number]) as of 07/04/2017 09:30  Ref. Range 09/23/2015 15:04 11/03/2015 08:34 12/10/2015 08:51 12/24/2015 08:50 01/07/2016 09:07 02/02/2016 09:03 03/01/2016 11:00 03/29/2016 09:06 04/26/2016 10:31 05/24/2016 09:50 06/28/2016 08:32 08/09/2016 09:18 08/30/2016 10:13 09/13/2016 09:35 09/27/2016 09:35 10/11/2016 08:38 10/25/2016 08:19 11/04/2016 10:09 11/17/2016 08:29 12/29/2016 11:32 01/12/2017 09:29 01/26/2017 09:31 02/09/2017 09:02 02/28/2017 09:04 03/14/2017 11:30 03/23/2017 09:40 04/18/2017 10:15 04/25/2017 13:00 05/02/2017 08:25 05/16/2017 08:53 05/25/2017 10:50 05/31/2017 12:20 106/12/2016 11:52 06/28/2017 14:31  CEA Latest Ref Range: 0.0 - 4.7 ng/mL 538.7 (H) 860.5 (H) 1,167.0 (H) 658.6 (H) 415.6 (H) 242.1 (H) 173.6 (H) 118.8 (H) 100.6 (H) 91.5 (H) 94.5 (H) 141.5 (H) 181.2 (H) 197.3 (H) 215.3 (H) 254.9 (H) 300.7 (H) 254.9 (H) 268.6 (H)                 CEA Latest Ref Range: 0.0 - 4.7 ng/mL                    638.7 (H) 521.8 (H) 398.7 (H) 386.8 (H) 422.2 (H) 529.1 (H) 537.2 (H) 397.9 (H) 263.6 (H) 155.8 (H) 213.0 (H) 132.2 (H) 87.9 (H) 57.0 (H) 164.5 (H)   IMPRESSION: 1.  Continued further decrease in size of the dominant posterior right hepatic dome lesion demonstrating no hypermetabolic FDG accumulation above background parenchymal levels on today's  study. No other hypermetabolic liver metastases are evident on the current exam. 2. No hypermetabolic lymphadenopathy in the chest, abdomen, or pelvis to suggest distant hypermetabolic metastatic involvement. The aortocaval lymph node in question on the recent diagnostic CT scan has decreased in size in the interval. 3.  Aortic Atherosclerois (ICD10-170.0)   Electronically Signed   By: Misty Stanley M.D.   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 Jan 2019-PET - improved metastatic lesions to the liver; improved in retroperitoneal lymph nodes 8mm in size.   # Patient currently on Vectibex [cut dose to 4 mg/kg] continue Enco 3 pills/ Bini 2 pills BID.  Patient tolerating treatment fairly well except for nausea/also renal failure [see discussion below].  #Hypomagnesemia magnesium 1.4; secondary to vectibex; will start supplementing at next visit.  # Skin rash- sec to vectibex; improved/stable continue minocycline; clindamycin gel.   # acute renal failure- ? Etiology- creat 1.8;  US kidney-NEG ? Sec to [employer].  Status post evaluation with nephrology; Dr. Candiss Norse.  # diarrhea-improved. Recommend continued Lomotil as needed.  # follow up in 2 weeks/CEA/vectibex; mag infusion  Cc; Dr.Singh.     [name], MD 07/18/2017 4:46 PM

## 2017-07-18 NOTE — Telephone Encounter (Signed)
Appointments mailed to patient.

## 2017-07-18 NOTE — Progress Notes (Signed)
MD aware of magnesium level.  No mag today, proceed with treatment

## 2017-07-19 ENCOUNTER — Telehealth: Payer: Self-pay | Admitting: *Deleted

## 2017-07-19 LAB — CEA: CEA: 98.3 ng/mL — ABNORMAL HIGH (ref 0.0–4.7)

## 2017-07-19 NOTE — Telephone Encounter (Signed)
Patient contacted- provided with CEA results. She thanked me for calling her.

## 2017-07-19 NOTE — Telephone Encounter (Signed)
-----   Message from [name], MD sent at 07/19/2017  9:38 AM EST ----- Please inform pt that her CEA is improving.continue current treatment.  Thx;

## 2017-08-01 ENCOUNTER — Inpatient Hospital Stay: Payer: Medicare HMO

## 2017-08-01 ENCOUNTER — Other Ambulatory Visit: Payer: Self-pay | Admitting: *Deleted

## 2017-08-01 ENCOUNTER — Encounter: Payer: Self-pay | Admitting: Internal Medicine

## 2017-08-01 ENCOUNTER — Inpatient Hospital Stay: Payer: Medicare HMO | Attending: Internal Medicine | Admitting: Internal Medicine

## 2017-08-01 VITALS — BP 130/87 | HR 71 | Temp 96.7°F | Resp 16 | Wt 198.6 lb

## 2017-08-01 DIAGNOSIS — Z79899 Other long term (current) drug therapy: Secondary | ICD-10-CM | POA: Insufficient documentation

## 2017-08-01 DIAGNOSIS — E669 Obesity, unspecified: Secondary | ICD-10-CM | POA: Diagnosis not present

## 2017-08-01 DIAGNOSIS — R110 Nausea: Secondary | ICD-10-CM | POA: Diagnosis not present

## 2017-08-01 DIAGNOSIS — C182 Malignant neoplasm of ascending colon: Secondary | ICD-10-CM

## 2017-08-01 DIAGNOSIS — C787 Secondary malignant neoplasm of liver and intrahepatic bile duct: Secondary | ICD-10-CM | POA: Diagnosis not present

## 2017-08-01 DIAGNOSIS — N179 Acute kidney failure, unspecified: Secondary | ICD-10-CM | POA: Diagnosis not present

## 2017-08-01 DIAGNOSIS — R5383 Other fatigue: Secondary | ICD-10-CM | POA: Diagnosis not present

## 2017-08-01 DIAGNOSIS — C189 Malignant neoplasm of colon, unspecified: Secondary | ICD-10-CM

## 2017-08-01 DIAGNOSIS — C799 Secondary malignant neoplasm of unspecified site: Principal | ICD-10-CM

## 2017-08-01 DIAGNOSIS — R197 Diarrhea, unspecified: Secondary | ICD-10-CM | POA: Diagnosis not present

## 2017-08-01 DIAGNOSIS — C772 Secondary and unspecified malignant neoplasm of intra-abdominal lymph nodes: Secondary | ICD-10-CM | POA: Insufficient documentation

## 2017-08-01 DIAGNOSIS — R21 Rash and other nonspecific skin eruption: Secondary | ICD-10-CM | POA: Diagnosis not present

## 2017-08-01 DIAGNOSIS — Z5112 Encounter for antineoplastic immunotherapy: Secondary | ICD-10-CM | POA: Diagnosis not present

## 2017-08-01 DIAGNOSIS — M1990 Unspecified osteoarthritis, unspecified site: Secondary | ICD-10-CM

## 2017-08-01 DIAGNOSIS — I10 Essential (primary) hypertension: Secondary | ICD-10-CM | POA: Diagnosis not present

## 2017-08-01 DIAGNOSIS — R970 Elevated carcinoembryonic antigen [CEA]: Secondary | ICD-10-CM

## 2017-08-01 LAB — COMPREHENSIVE METABOLIC PANEL
ALT: 20 U/L (ref 14–54)
AST: 28 U/L (ref 15–41)
Albumin: 2.6 g/dL — ABNORMAL LOW (ref 3.5–5.0)
Alkaline Phosphatase: 133 U/L — ABNORMAL HIGH (ref 38–126)
Anion gap: 5 (ref 5–15)
BILIRUBIN TOTAL: 0.5 mg/dL (ref 0.3–1.2)
BUN: 19 mg/dL (ref 6–20)
CO2: 22 mmol/L (ref 22–32)
Calcium: 8.1 mg/dL — ABNORMAL LOW (ref 8.9–10.3)
Chloride: 109 mmol/L (ref 101–111)
Creatinine, Ser: 1.53 mg/dL — ABNORMAL HIGH (ref 0.44–1.00)
GFR calc non Af Amer: 34 mL/min — ABNORMAL LOW (ref >60)
GFR, EST AFRICAN AMERICAN: 40 mL/min — AB (ref >60)
Glucose, Bld: 85 mg/dL (ref 65–99)
POTASSIUM: 4.1 mmol/L (ref 3.5–5.1)
Sodium: 136 mmol/L (ref 135–145)
TOTAL PROTEIN: 6.3 g/dL — AB (ref 6.5–8.1)

## 2017-08-01 LAB — CBC WITH DIFFERENTIAL/PLATELET
BASOS ABS: 0 10*3/uL (ref 0–0.1)
Basophils Relative: 1 %
EOS ABS: 0.3 10*3/uL (ref 0–0.7)
EOS PCT: 6 %
HCT: 31.2 % — ABNORMAL LOW (ref 35.0–47.0)
HEMOGLOBIN: 10.5 g/dL — AB (ref 12.0–16.0)
LYMPHS PCT: 13 %
Lymphs Abs: 0.7 10*3/uL — ABNORMAL LOW (ref 1.0–3.6)
MCH: 32.8 pg (ref 26.0–34.0)
MCHC: 33.7 g/dL (ref 32.0–36.0)
MCV: 97.2 fL (ref 80.0–100.0)
Monocytes Absolute: 0.5 10*3/uL (ref 0.2–0.9)
Monocytes Relative: 9 %
NEUTROS PCT: 71 %
Neutro Abs: 4 10*3/uL (ref 1.4–6.5)
PLATELETS: 269 10*3/uL (ref 150–440)
RBC: 3.21 MIL/uL — AB (ref 3.80–5.20)
RDW: 16.4 % — ABNORMAL HIGH (ref 11.5–14.5)
WBC: 5.6 10*3/uL (ref 3.6–11.0)

## 2017-08-01 LAB — MAGNESIUM: Magnesium: 1.4 mg/dL — ABNORMAL LOW (ref 1.7–2.4)

## 2017-08-01 MED ORDER — MAGNESIUM SULFATE 2 GM/50ML IV SOLN
2.0000 g | Freq: Once | INTRAVENOUS | Status: AC
  Administered 2017-08-01: 2 g via INTRAVENOUS
  Filled 2017-08-01: qty 50

## 2017-08-01 MED ORDER — SODIUM CHLORIDE 0.9% FLUSH
10.0000 mL | INTRAVENOUS | Status: DC | PRN
  Filled 2017-08-01: qty 10

## 2017-08-01 MED ORDER — HEPARIN SOD (PORK) LOCK FLUSH 100 UNIT/ML IV SOLN: 500.0000 [IU] | Freq: Once | INTRAVENOUS | Status: DC | PRN

## 2017-08-01 MED ORDER — SODIUM CHLORIDE 0.9 % IV SOLN: Freq: Once | INTRAVENOUS | Status: DC

## 2017-08-01 MED ORDER — SODIUM CHLORIDE 0.9% FLUSH
10.0000 mL | Freq: Once | INTRAVENOUS | Status: AC
  Administered 2017-08-01: 10 mL via INTRAVENOUS
  Filled 2017-08-01: qty 10

## 2017-08-01 MED ORDER — ONDANSETRON HCL 4 MG/2ML IJ SOLN
8.0000 mg | Freq: Once | INTRAMUSCULAR | Status: AC
  Administered 2017-08-01: 8 mg via INTRAVENOUS
  Filled 2017-08-01: qty 4

## 2017-08-01 MED ORDER — SODIUM CHLORIDE 0.9 % IV SOLN
Freq: Once | INTRAVENOUS | Status: AC
  Administered 2017-08-01: 10:00:00 via INTRAVENOUS
  Filled 2017-08-01: qty 1000

## 2017-08-01 MED ORDER — DEXAMETHASONE SODIUM PHOSPHATE 10 MG/ML IJ SOLN
10.0000 mg | Freq: Once | INTRAMUSCULAR | Status: AC
  Administered 2017-08-01: 10 mg via INTRAVENOUS
  Filled 2017-08-01: qty 1

## 2017-08-01 MED ORDER — HEPARIN SOD (PORK) LOCK FLUSH 100 UNIT/ML IV SOLN
500.0000 [IU] | Freq: Once | INTRAVENOUS | Status: AC
  Administered 2017-08-01: 500 [IU] via INTRAVENOUS
  Filled 2017-08-01: qty 5

## 2017-08-01 MED ORDER — PANITUMUMAB CHEMO INJECTION 100 MG/5ML
4.0000 mg/kg | Freq: Once | INTRAVENOUS | Status: AC
  Administered 2017-08-01: 360 mg via INTRAVENOUS
  Filled 2017-08-01: qty 18

## 2017-08-01 NOTE — Assessment & Plan Note (Addendum)
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 Jan 2019-PET - improved metastatic lesions to the liver; improved in retroperitoneal lymph nodes 27mm in size. CEA- 98 improving.   # Patient currently on Vectibex [cut dose to 4 mg/kg] continue Enco 3 pills/ Bini 2 pills BID.  Patient tolerating treatment fairly well except for nausea/also renal failure [see discussion below].  #Also discussed regarding follow-up at [facility] anticipation of any clinical trials/further treatment options if patient progresses on current therapy.   # Nausea- G-2; improved after dex/Zofran  #Hypomagnesemia magnesium 1.4; secondary to vectibex; 2 g of mag today.  # Skin rash- sec to vectibex; improved/ clindamycin gel.   # acute renal failure- ? Etiology- creat 1.5; improved.  # diarrhea-improved. Recommend continued Lomotil as needed.  # follow up in 2 weeks/CEA/vectibex; mag infusion

## 2017-08-01 NOTE — Progress Notes (Signed)
[employer] NOTE  [name], [name], MD (Internal Medicine) Bary Castilla, Forest Gleason, [name], RN as Registered Nurse [name], MD as Consulting Physician (Internal Medicine)  CHIEF COMPLAINTS/PURPOSE OF CONSULTATION:   Oncology History   # MARCH/APRIL 2017-NEUROENDOCRINE CA STAGE IV; [name]; [name]; [name]; [name]; [name]; [name]; [name]; July 14th  2017 CT- PR  # AUG 28th- FOLFIRI +Avastin; OCT 5th CT scan- PR.   # MARCH 20th- CT STABLE liver lesions; ? Enlarging cecal lesion [CEA rising]; March 26th- FOLFOX [last avastin march 12th 2018];   # resection of primary tumor [Dr.Byrnett]; y90- June 19th 2018.  # July 11th-FOLFOX + Avastin; SEP 2018 [CT- stable liver lesions; increasing ~21mm RP LN]; RISING CEA [stopped end of Sep 2018 ]  # OCT 15th 2018- Vemu+ Iri+ Pan [mid nov stopped iri- neutropenia/dia+vemu- arthralgias]; 05/11/2017-Started enco+Bini+vectibex   # [facility]- B-RAF V600E- MUTATED [May 2018]  # right kidney lesion 2.6 x 1.6 cm ? Angiolipoma [followed by Urology in past]     Malignant neoplasm of ascending colon ([place])     HISTORY OF PRESENTING ILLNESS:  Kathleen James 67 y.o.  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   Patient nausea improved after using antiemetics prior to her treatment at last visit.  Denies any nausea vomiting at this time.  No chest pain or cough.  No diarrhea.  Rash is better controlled.  She has intermittent nausea; with mild vomiting. Mild fatigue.  Not any worse.   ROS: A  complete 10 point review of system is done which is negative except mentioned above in history of present illness  MEDICAL HISTORY:  Past Medical History:  Diagnosis Date  . Angiolipoma of kidney    followed with serial CTs ,,  Dr. Jacqlyn Larsen  . Anxiety   . Arthritis   . 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 Chemotherapy induced diarrhea   . Hammer toe   . Hypertension   . Liver cancer ([place])   . Neuro-endocrine carcinoma ([place])   . Obesity (BMI 30-39.9)     SURGICAL HISTORY: Past Surgical History:  Procedure Laterality Date  . ABDOMINAL HYSTERECTOMY  1995   endometriosis, heavy bleeding  . BUNIONECTOMY Bilateral   . CESAREAN SECTION  1985  . COLONOSCOPY WITH PROPOFOL N/A 10/09/2015   Procedure: COLONOSCOPY WITH PROPOFOL;  Surgeon: Robert Bellow, MD;  Location: [facility] ENDOSCOPY;  Service: Endoscopy;  Laterality: N/A;  . IR ANGIOGRAM SELECTIVE EACH ADDITIONAL VESSEL  11/22/2016  . IR ANGIOGRAM SELECTIVE EACH ADDITIONAL VESSEL  11/22/2016  . IR ANGIOGRAM SELECTIVE EACH ADDITIONAL VESSEL  11/22/2016  . IR ANGIOGRAM SELECTIVE EACH ADDITIONAL VESSEL  12/07/2016  . IR ANGIOGRAM SELECTIVE EACH ADDITIONAL VESSEL  12/07/2016  . IR ANGIOGRAM VISCERAL SELECTIVE  11/22/2016  . IR ANGIOGRAM VISCERAL SELECTIVE  12/07/2016  . IR EMBO ARTERIAL NOT HEMORR HEMANG INC GUIDE ROADMAPPING  11/22/2016  . IR EMBO TUMOR ORGAN ISCHEMIA INFARCT INC GUIDE ROADMAPPING  12/07/2016  . IR GENERIC HISTORICAL  04/07/2016   IR RADIOLOGIST EVAL &  MGMT 04/07/2016 Arne Cleveland, MD [facility] INTERV RAD  . IR RADIOLOGIST EVAL & MGMT  04/14/2017  . IR US GUIDE VASC ACCESS RIGHT  11/22/2016  . IR US GUIDE VASC ACCESS RIGHT  12/07/2016  . LAPAROSCOPIC RIGHT COLECTOMY Right 10/26/2016   Procedure: LAPAROSCOPIC RIGHT COLECTOMY;  Surgeon: Robert Bellow, MD;  Location: [facility] ORS;  Service: General;  Laterality: Right;  . OOPHORECTOMY    . PORTACATH PLACEMENT Left 10/01/2015   Procedure:  INSERTION PORT-A-CATH;  Surgeon: Robert Bellow, MD;  Location: [facility] ORS;  Service: General;  Laterality: Left;  . ROTATOR CUFF REPAIR Right 2008   right shoulder.  [place]     SOCIAL HISTORY: She lives at home with her family in [place]. 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 Social History   Socioeconomic History  . Marital status: Married    Spouse name: Not on file  . Number of children: Not on file  . Years of education: Not on file  . Highest education level: Not on file  Social Needs  . Financial resource strain: Not on file  . Food insecurity - worry: Not on file  . Food insecurity - inability: Not on file  . Transportation needs - medical: Not on file  . Transportation needs - non-medical: Not on file  Occupational History  . Not on file  Tobacco Use  . Smoking status: Never Smoker  . Smokeless tobacco: Never Used  Substance and Sexual Activity  . Alcohol use: Yes    Alcohol/week: 0.0 oz    Comment: occasionally  . Drug use: No  . Sexual activity: Not Currently  Other Topics Concern  . Not on file  Social History Narrative  . Not on file    FAMILY HISTORY: Family History  Problem Relation Age of Onset  . Mental illness Mother 59       bipolar, dementia  . Cancer Maternal Aunt 70       breast ca  . Hypertension Father   . Stroke Father 34       deceased  . COPD Father   . Heart disease Sister        deceased    ALLERGIES:  is allergic to bee venom; dilaudid [hydromorphone hcl]; and morphine and related.  MEDICATIONS:  Current Outpatient Medications  Medication Sig Dispense Refill  . ALPRAZolam (XANAX) 0.25 MG tablet Take 1 tablet (0.25 mg total) by mouth 2 (two) times daily as needed for anxiety. 60 tablet 1  . Binimetinib 15 MG TABS Take 45 mg every 12 (twelve) hours by mouth. (Patient taking differently: Take 30 mg by mouth every 12 (twelve) hours. ) 180 tablet 4  . Biotin w/ Vitamins C & E  (HAIR/SKIN/NAILS PO) Take 1 tablet by mouth daily.    . Chlorpheniramine Maleate (CHLOR-TABLETS PO) Take 1 tablet by mouth every evening.    . clindamycin (CLINDAGEL) 1 % gel Apply topically 2 (two) times daily. Apply to affected areas twice a day. 30 g 0  . diphenoxylate-atropine (LOMOTIL) 2.5-0.025 MG tablet TAKE 1 TABLET FOUR TIMES DAILY AS NEEDEDFOR DIARRHEA OR LOOSE STOOLS 30 tablet 3  . Encorafenib 75 MG CAPS Take 300 mg daily by mouth. 120 capsule 4  . lidocaine-prilocaine (EMLA) cream Apply 1 application topically as needed. Apply to port a cath site 1 hour prior to chemotherapy treatments 30 g 6  . loperamide (IMODIUM A-D) 2 MG tablet Take 2 mg by  mouth 4 (four) times daily as needed for diarrhea or loose stools.    . metoprolol tartrate (LOPRESSOR) 25 MG tablet Take 1 tablet (25 mg total) by mouth 2 (two) times daily. 60 tablet 3  . minocycline (MINOCIN) 100 MG capsule Take 1 capsule (100 mg total) by mouth daily. 60 capsule 1  . naproxen sodium (ANAPROX) 220 MG tablet Take 440 mg by mouth 2 (two) times daily as needed.    . ondansetron (ZOFRAN) 8 MG tablet Take 1 tablet (8 mg total) by mouth every 8 (eight) hours as needed for nausea or vomiting. 40 tablet 3  . potassium chloride (K-DUR) 10 MEQ tablet TAKE ONE TABLET BY MOUTH TWICE DAILY 60 tablet 3  . prochlorperazine (COMPAZINE) 10 MG tablet TAKE ONE TABLET BY MOUTH EVERY 6 HOURS AS NEEDED FOR NAUSEA OR VOMITING 30 tablet 2  . triamcinolone ointment (KENALOG) 0.5 % Apply 1 application topically 2 (two) times daily. To rash 30 g 0   No current facility-administered medications for this visit.    Facility-Administered Medications Ordered in Other Visits  Medication Dose Route Frequency Provider Last Rate Last Dose  . heparin lock flush 100 unit/mL  500 Units Intravenous Once Charlaine Dalton R, MD      . heparin lock flush 100 unit/mL  500 Units Intracatheter Once PRN Charlaine Dalton R, MD      . sodium chloride flush (NS) 0.9  % injection 10 mL  10 mL Intravenous PRN [name], MD   10 mL at 11/24/15 0836  . sodium chloride flush (NS) 0.9 % injection 10 mL  10 mL Intracatheter PRN [name], MD          .  PHYSICAL EXAMINATION: ECOG PERFORMANCE STATUS: 0 - Asymptomatic  Vitals:   08/01/17 0847  BP: 130/87  Pulse: 71  Resp: 16  Temp: (!) 96.7 F (35.9 C)   Filed Weights   08/01/17 0847  Weight: 198 lb 9.6 oz (90.1 kg)    GENERAL: Well-nourished well-developed; Alert, no distress and comfortable.  She is accompanied by her daughter EYES: no pallor or icterus OROPHARYNX: no thrush or ulceration; good dentition  NECK: supple, no masses felt LYMPH:  no palpable lymphadenopathy in the cervical, axillary or inguinal regions LUNGS: clear to auscultation and  No wheeze or crackles HEART/CVS: regular rate & rhythm and no murmurs; No lower extremity edema ABDOMEN: abdomen soft, non-tender and normal bowel sounds; positive for hepatomegaly. Musculoskeletal:no cyanosis of digits and no clubbing  PSYCH: alert & oriented x 3 with fluent speech NEURO: no focal motor/sensory deficits SKIN:  Acne-like rash noted on the torso/face.  LABORATORY DATA:  I have reviewed the data as listed Lab Results  Component Value Date   WBC 5.6 08/01/2017   HGB 10.5 (L) 08/01/2017   HCT 31.2 (L) 08/01/2017   MCV 97.2 08/01/2017   PLT 269 08/01/2017   Recent Labs    07/04/17 0909 07/18/17 0804 08/01/17 0820  NA 136 135 136  K 3.9 3.9 4.1  CL 107 107 109  CO2 21* 19* 22  GLUCOSE 92 82 85  BUN 24* 27* 19  CREATININE 1.87* 1.83* 1.53*  CALCIUM 8.5* 8.5* 8.1*  GFRNONAA 27* 28* 34*  GFRAA 31* 32* 40*  PROT 7.0 6.5 6.3*  ALBUMIN 3.0* 2.8* 2.6*  AST 34 32 28  ALT 24 24 20   ALKPHOS 173* 158* 133*  BILITOT 0.6 0.4 0.5    RADIOGRAPHIC STUDIES: I have personally reviewed the radiological  images as listed and agreed with the findings in the report. Nm Pet Image Restag (ps) Skull Base To  Thigh  Result Date: 07/14/2017 CLINICAL DATA:  Subsequent treatment strategy for right colon cancer. EXAM: NUCLEAR MEDICINE PET SKULL BASE TO THIGH TECHNIQUE: 11.8 mCi F-18 FDG was injected intravenously. Full-ring PET imaging was performed from the skull base to thigh after the radiotracer. CT data was obtained and used for attenuation correction and anatomic localization. FASTING BLOOD GLUCOSE:  Value: 70 for mg/dl COMPARISON:  CT chest/abdomen/pelvis on 02/28/2017. PET-CT 10/06/2015. FINDINGS: NECK: No hypermetabolic lymph nodes in the neck. CHEST: No hypermetabolic mediastinal or hilar nodes. No suspicious pulmonary nodules on the CT scan. Right Port-A-Cath tip is at the SVC/RA junction. ABDOMEN/PELVIS: Dominant hepatic lesion in the dome of the liver has continued to decrease in size measuring 2.6 x 2.1 cm today compared to 4.3 x 3.0 cm on the study from 02/28/2017. PET imaging shows no hypermetabolism within the lesion itself although the surrounding parenchyma has minimally increased FDG accumulation relative to background liver parenchyma and is likely reactive. Additional hepatic metastases seen on the previous PET-CT have resolved completely with no discernible lesions on noncontrast CT and no uptake above background liver uptake at these locations. The additional scattered cysts and cavernous hemangioma documented previously are again noted. The ileocolic lymphadenopathy seen on prior PET-CT has resolved or been surgically resected with no ileocolic hypermetabolic lymphadenopathy on the current exam. CT scan from September documented enlarging aortocaval lymph node which has decreased in the interval measuring 6 mm on image 149 of series 3 today compared to 9 mm on that study. There is no hypermetabolism discernible within this lymph node on today's exam. No other hypermetabolic lymphadenopathy in the abdomen or pelvis on the current study. Right hemicolectomy.  Abdominal aortic atherosclerosis.  SKELETON: No focal hypermetabolic activity to suggest skeletal metastasis. IMPRESSION: 1. Continued further decrease in size of the dominant posterior right hepatic dome lesion demonstrating no hypermetabolic FDG accumulation above background parenchymal levels on today's study. No other hypermetabolic liver metastases are evident on the current exam. 2. No hypermetabolic lymphadenopathy in the chest, abdomen, or pelvis to suggest distant hypermetabolic metastatic involvement. The aortocaval lymph node in question on the recent diagnostic CT scan has decreased in size in the interval. 3.  Aortic Atherosclerois (ICD10-170.0) Electronically Signed   By: Misty Stanley M.D.   On: 07/14/2017 10:36    Results for ROSALEEN, MAZER (MRN [id number]) as of 07/04/2017 09:30  Ref. Range 09/23/2015 15:04 11/03/2015 08:34 12/10/2015 08:51 12/24/2015 08:50 01/07/2016 09:07 02/02/2016 09:03 03/01/2016 11:00 03/29/2016 09:06 04/26/2016 10:31 05/24/2016 09:50 06/28/2016 08:32 08/09/2016 09:18 08/30/2016 10:13 09/13/2016 09:35 09/27/2016 09:35 10/11/2016 08:38 10/25/2016 08:19 11/04/2016 10:09 11/17/2016 08:29 12/29/2016 11:32 01/12/2017 09:29 01/26/2017 09:31 02/09/2017 09:02 02/28/2017 09:04 03/14/2017 11:30 03/23/2017 09:40 04/18/2017 10:15 04/25/2017 13:00 05/02/2017 08:25 05/16/2017 08:53 05/25/2017 10:50 05/31/2017 12:20 106-Jul-202018 11:52 06/28/2017 14:31  CEA Latest Ref Range: 0.0 - 4.7 ng/mL 538.7 (H) 860.5 (H) 1,167.0 (H) 658.6 (H) 415.6 (H) 242.1 (H) 173.6 (H) 118.8 (H) 100.6 (H) 91.5 (H) 94.5 (H) 141.5 (H) 181.2 (H) 197.3 (H) 215.3 (H) 254.9 (H) 300.7 (H) 254.9 (H) 268.6 (H)                 CEA Latest Ref Range: 0.0 - 4.7 ng/mL                    638.7 (H) 521.8 (H) 398.7 (H) 386.8 (H) 422.2 (H) 529.1 (H) 537.2 (  H) 397.9 (H) 263.6 (H) 155.8 (H) 213.0 (H) 132.2 (H) 87.9 (H) 57.0 (H) 164.5 (H)   IMPRESSION: 1. Continued further decrease in size of the dominant posterior right hepatic dome lesion demonstrating no hypermetabolic FDG accumulation above  background parenchymal levels on today's study. No other hypermetabolic liver metastases are evident on the current exam. 2. No hypermetabolic lymphadenopathy in the chest, abdomen, or pelvis to suggest distant hypermetabolic metastatic involvement. The aortocaval lymph node in question on the recent diagnostic CT scan has decreased in size in the interval. 3.  Aortic Atherosclerois (ICD10-170.0)   Electronically Signed   By: Misty Stanley M.D.   On: 07/14/2017 10:36  Results for SUKHMAN, KOCHER (MRN [id number]) as of 08/01/2017 13:42  Ref. 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 Jan 2019-PET - improved metastatic lesions to the liver; improved in retroperitoneal lymph nodes 26mm in size. CEA- 98 improving.   # Patient currently on Vectibex [cut dose to 4 mg/kg] continue Enco 3 pills/ Bini 2 pills BID.  Patient tolerating treatment fairly well except for nausea/also renal failure [see discussion below].  #Also discussed regarding follow-up at [facility] anticipation of any clinical trials/further treatment options if patient progresses on current therapy.   # Nausea- G-2; improved after dex/Zofran  #Hypomagnesemia magnesium 1.4; secondary to vectibex; 2 g of mag today.  # Skin rash- sec to vectibex; improved/ clindamycin gel.   # acute renal failure- ? Etiology- creat 1.5; improved.  # diarrhea-improved. Recommend continued Lomotil as needed.  # follow up in 2 weeks/CEA/vectibex; mag infusion     [name], MD 08/01/2017 1:44 PM

## 2017-08-02 ENCOUNTER — Other Ambulatory Visit: Payer: Self-pay | Admitting: Internal Medicine

## 2017-08-02 LAB — CEA: CEA1: 49.3 ng/mL — AB (ref 0.0–4.7)

## 2017-08-15 ENCOUNTER — Encounter: Payer: Self-pay | Admitting: Internal Medicine

## 2017-08-15 ENCOUNTER — Inpatient Hospital Stay (HOSPITAL_BASED_OUTPATIENT_CLINIC_OR_DEPARTMENT_OTHER): Payer: Medicare HMO | Admitting: Internal Medicine

## 2017-08-15 ENCOUNTER — Inpatient Hospital Stay: Payer: Medicare HMO

## 2017-08-15 VITALS — BP 131/84 | HR 69 | Temp 98.2°F | Resp 16 | Wt 198.6 lb

## 2017-08-15 DIAGNOSIS — C182 Malignant neoplasm of ascending colon: Secondary | ICD-10-CM

## 2017-08-15 DIAGNOSIS — E669 Obesity, unspecified: Secondary | ICD-10-CM

## 2017-08-15 DIAGNOSIS — R5383 Other fatigue: Secondary | ICD-10-CM | POA: Diagnosis not present

## 2017-08-15 DIAGNOSIS — C189 Malignant neoplasm of colon, unspecified: Secondary | ICD-10-CM

## 2017-08-15 DIAGNOSIS — Z79899 Other long term (current) drug therapy: Secondary | ICD-10-CM

## 2017-08-15 DIAGNOSIS — M1990 Unspecified osteoarthritis, unspecified site: Secondary | ICD-10-CM | POA: Diagnosis not present

## 2017-08-15 DIAGNOSIS — R21 Rash and other nonspecific skin eruption: Secondary | ICD-10-CM

## 2017-08-15 DIAGNOSIS — C787 Secondary malignant neoplasm of liver and intrahepatic bile duct: Secondary | ICD-10-CM | POA: Diagnosis not present

## 2017-08-15 DIAGNOSIS — N179 Acute kidney failure, unspecified: Secondary | ICD-10-CM

## 2017-08-15 DIAGNOSIS — C772 Secondary and unspecified malignant neoplasm of intra-abdominal lymph nodes: Secondary | ICD-10-CM

## 2017-08-15 DIAGNOSIS — C799 Secondary malignant neoplasm of unspecified site: Principal | ICD-10-CM

## 2017-08-15 DIAGNOSIS — R970 Elevated carcinoembryonic antigen [CEA]: Secondary | ICD-10-CM

## 2017-08-15 DIAGNOSIS — I10 Essential (primary) hypertension: Secondary | ICD-10-CM | POA: Diagnosis not present

## 2017-08-15 DIAGNOSIS — Z5112 Encounter for antineoplastic immunotherapy: Secondary | ICD-10-CM | POA: Diagnosis not present

## 2017-08-15 LAB — CBC WITH DIFFERENTIAL/PLATELET
BASOS ABS: 0.1 10*3/uL (ref 0–0.1)
Basophils Relative: 1 %
EOS PCT: 3 %
Eosinophils Absolute: 0.2 10*3/uL (ref 0–0.7)
HCT: 31 % — ABNORMAL LOW (ref 35.0–47.0)
Hemoglobin: 10.7 g/dL — ABNORMAL LOW (ref 12.0–16.0)
LYMPHS ABS: 1 10*3/uL (ref 1.0–3.6)
Lymphocytes Relative: 14 %
MCH: 33 pg (ref 26.0–34.0)
MCHC: 34.5 g/dL (ref 32.0–36.0)
MCV: 95.8 fL (ref 80.0–100.0)
Monocytes Absolute: 0.7 10*3/uL (ref 0.2–0.9)
Monocytes Relative: 9 %
Neutro Abs: 5.6 10*3/uL (ref 1.4–6.5)
Neutrophils Relative %: 73 %
Platelets: 336 10*3/uL (ref 150–440)
RBC: 3.24 MIL/uL — AB (ref 3.80–5.20)
RDW: 15.5 % — AB (ref 11.5–14.5)
WBC: 7.6 10*3/uL (ref 3.6–11.0)

## 2017-08-15 LAB — COMPREHENSIVE METABOLIC PANEL
ALT: 21 U/L (ref 14–54)
AST: 31 U/L (ref 15–41)
Albumin: 2.6 g/dL — ABNORMAL LOW (ref 3.5–5.0)
Alkaline Phosphatase: 152 U/L — ABNORMAL HIGH (ref 38–126)
Anion gap: 8 (ref 5–15)
BILIRUBIN TOTAL: 0.4 mg/dL (ref 0.3–1.2)
BUN: 21 mg/dL — AB (ref 6–20)
CO2: 18 mmol/L — ABNORMAL LOW (ref 22–32)
CREATININE: 1.71 mg/dL — AB (ref 0.44–1.00)
Calcium: 8.3 mg/dL — ABNORMAL LOW (ref 8.9–10.3)
Chloride: 109 mmol/L (ref 101–111)
GFR calc Af Amer: 35 mL/min — ABNORMAL LOW (ref >60)
GFR, EST NON AFRICAN AMERICAN: 30 mL/min — AB (ref >60)
Glucose, Bld: 103 mg/dL — ABNORMAL HIGH (ref 65–99)
Potassium: 3.8 mmol/L (ref 3.5–5.1)
Sodium: 135 mmol/L (ref 135–145)
TOTAL PROTEIN: 6.4 g/dL — AB (ref 6.5–8.1)

## 2017-08-15 LAB — MAGNESIUM: MAGNESIUM: 1.4 mg/dL — AB (ref 1.7–2.4)

## 2017-08-15 MED ORDER — SODIUM CHLORIDE 0.9% FLUSH
10.0000 mL | Freq: Once | INTRAVENOUS | Status: AC
  Administered 2017-08-15: 10 mL via INTRAVENOUS
  Filled 2017-08-15: qty 10

## 2017-08-15 MED ORDER — MAGNESIUM SULFATE 2 GM/50ML IV SOLN
2.0000 g | Freq: Once | INTRAVENOUS | Status: AC
  Administered 2017-08-15: 2 g via INTRAVENOUS
  Filled 2017-08-15: qty 50

## 2017-08-15 MED ORDER — SODIUM CHLORIDE 0.9 % IV SOLN
Freq: Once | INTRAVENOUS | Status: AC
  Administered 2017-08-15: 10:00:00 via INTRAVENOUS
  Filled 2017-08-15: qty 1000

## 2017-08-15 MED ORDER — SODIUM CHLORIDE 0.9 % IV SOLN
4.0000 mg/kg | Freq: Once | INTRAVENOUS | Status: AC
  Administered 2017-08-15: 360 mg via INTRAVENOUS
  Filled 2017-08-15: qty 18

## 2017-08-15 MED ORDER — SODIUM CHLORIDE 0.9 % IV SOLN: Freq: Once | INTRAVENOUS | Status: DC

## 2017-08-15 MED ORDER — ONDANSETRON HCL 4 MG/2ML IJ SOLN
8.0000 mg | Freq: Once | INTRAMUSCULAR | Status: AC
  Administered 2017-08-15: 8 mg via INTRAVENOUS
  Filled 2017-08-15: qty 4

## 2017-08-15 MED ORDER — HEPARIN SOD (PORK) LOCK FLUSH 100 UNIT/ML IV SOLN
500.0000 [IU] | Freq: Once | INTRAVENOUS | Status: AC
  Administered 2017-08-15: 500 [IU] via INTRAVENOUS
  Filled 2017-08-15: qty 5

## 2017-08-15 MED ORDER — DEXAMETHASONE SODIUM PHOSPHATE 10 MG/ML IJ SOLN
10.0000 mg | Freq: Once | INTRAMUSCULAR | Status: AC
  Administered 2017-08-15: 10 mg via INTRAVENOUS
  Filled 2017-08-15: qty 1

## 2017-08-15 NOTE — Assessment & Plan Note (Addendum)
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 Jan 2019-PET - improved metastatic lesions to the liver; improved in retroperitoneal lymph nodes 47mm in size. CEA- 49 improving.   # Patient currently on Vectibex [cut dose to 4 mg/kg] continue Enco 3 pills/ Bini 2 pills BID.  Continue current therapy.  #Also discussed regarding follow-up at [facility] anticipation of any clinical trials/further treatment options if patient progresses on current therapy. appt on macrh 29th.   #Hypomagnesemia magnesium 1.4; secondary to vectibex; 2 g of mag today.  # Skin rash- sec to vectibex; improved/ clindamycin gel.  Continue minocycline  # acute renal failure- ? Etiology- creat 1.7 stable. Stop minocycline.  # follow up in 3 weeks/CEA/vectibex; mag infusion.  [vacation]

## 2017-08-15 NOTE — Progress Notes (Signed)
[place] NOTE  [name], [name], MD (Internal Medicine) Bary Castilla, Forest Gleason, [name], RN as Registered Nurse [name], MD as Consulting Physician (Internal Medicine)  CHIEF COMPLAINTS/PURPOSE OF CONSULTATION:   Oncology History   # MARCH/APRIL 2017-NEUROENDOCRINE CA STAGE IV; [name]; [name]; [name]; [name]; [name]; [name]; [name]; July 14th  2017 CT- PR  # AUG 28th- FOLFIRI +Avastin; OCT 5th CT scan- PR.   # MARCH 20th- CT STABLE liver lesions; ? Enlarging cecal lesion [CEA rising]; March 26th- FOLFOX [last avastin march 12th 2018];   # resection of primary tumor [Dr.Byrnett]; y90- June 19th 2018.  # July 11th-FOLFOX + Avastin; SEP 2018 [CT- stable liver lesions; increasing ~69mm RP LN]; RISING CEA [stopped end of Sep 2018 ]  # OCT 15th 2018- Vemu+ Iri+ Pan [mid nov stopped iri- neutropenia/dia+vemu- arthralgias]; 05/11/2017-Started enco+Bini+vectibex   # [facility]- B-RAF V600E- MUTATED [May 2018]  # right kidney lesion 2.6 x 1.6 cm ? Angiolipoma [followed by Urology in past]     Malignant neoplasm of ascending colon ([place])     HISTORY OF PRESENTING ILLNESS:  Kathleen James 67 y.o.  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   Mild rash not very symptomatic.  Patient is currently on minocycline.  He admits to drinking enough fluids.  No nausea no vomiting.  No chest pain or cough.  No diarrhea.  Mild fatigue overall well controlled.  She is awaiting to celebrate her birthday in approximately 2 weeks from now.  She  has an appointment with [facility] on March 29th-to discuss any further treatment options.  ROS: A complete 10 point review of system is done which is negative except mentioned above in history of present illness  MEDICAL HISTORY:  Past Medical History:  Diagnosis Date  . Angiolipoma of kidney    followed with serial CTs ,,  Dr. Jacqlyn Larsen  . Anxiety   . Arthritis   . 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 Chemotherapy induced diarrhea   . Hammer toe   . Hypertension   . Liver cancer ([place])   . Neuro-endocrine carcinoma ([place])   . Obesity (BMI 30-39.9)     SURGICAL HISTORY: Past Surgical History:  Procedure Laterality Date  . ABDOMINAL HYSTERECTOMY  1995   endometriosis, heavy bleeding  . BUNIONECTOMY Bilateral   . CESAREAN SECTION  1985  . COLONOSCOPY WITH PROPOFOL N/A 10/09/2015   Procedure: COLONOSCOPY WITH PROPOFOL;  Surgeon: Robert Bellow, MD;  Location[facility] ENDOSCOPY;  Service: Endoscopy;  Laterality: N/A;  . IR ANGIOGRAM SELECTIVE EACH ADDITIONAL VESSEL  11/22/2016  . IR ANGIOGRAM SELECTIVE EACH ADDITIONAL VESSEL  11/22/2016  . IR ANGIOGRAM SELECTIVE EACH ADDITIONAL VESSEL  11/22/2016  . IR ANGIOGRAM SELECTIVE EACH ADDITIONAL VESSEL  12/07/2016  . IR ANGIOGRAM SELECTIVE EACH ADDITIONAL VESSEL  12/07/2016  . IR ANGIOGRAM VISCERAL SELECTIVE  11/22/2016  . IR ANGIOGRAM VISCERAL SELECTIVE  12/07/2016  . IR EMBO ARTERIAL NOT HEMORR HEMANG INC GUIDE ROADMAPPING  11/22/2016  . IR EMBO TUMOR ORGAN ISCHEMIA  INFARCT INC GUIDE ROADMAPPING  12/07/2016  . IR GENERIC HISTORICAL  04/07/2016   IR RADIOLOGIST EVAL & MGMT 04/07/2016 Arne Cleveland, MD [facility] INTERV RAD  . IR RADIOLOGIST EVAL & MGMT  04/14/2017  . IR US GUIDE VASC ACCESS RIGHT  11/22/2016  . IR US GUIDE VASC ACCESS RIGHT  12/07/2016  . LAPAROSCOPIC RIGHT COLECTOMY Right 10/26/2016   Procedure: LAPAROSCOPIC RIGHT COLECTOMY;  Surgeon: Robert Bellow, MD;  Location: [facility] ORS;   Service: General;  Laterality: Right;  . OOPHORECTOMY    . PORTACATH PLACEMENT Left 10/01/2015   Procedure: INSERTION PORT-A-CATH;  Surgeon: Robert Bellow, MD;  Location: [facility] ORS;  Service: General;  Laterality: Left;  . ROTATOR CUFF REPAIR Right 2008   right shoulder.  [place]     SOCIAL HISTORY: She lives at home with her family in [place]. 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 Social History   Socioeconomic History  . Marital status: Married    Spouse name: Not on file  . Number of children: Not on file  . Years of education: Not on file  . Highest education level: Not on file  Social Needs  . Financial resource strain: Not on file  . Food insecurity - worry: Not on file  . Food insecurity - inability: Not on file  . Transportation needs - medical: Not on file  . Transportation needs - non-medical: Not on file  Occupational History  . Not on file  Tobacco Use  . Smoking status: Never Smoker  . Smokeless tobacco: Never Used  Substance and Sexual Activity  . Alcohol use: Yes    Alcohol/week: 0.0 oz    Comment: occasionally  . Drug use: No  . Sexual activity: Not Currently  Other Topics Concern  . Not on file  Social History Narrative  . Not on file    FAMILY HISTORY: Family History  Problem Relation Age of Onset  . Mental illness Mother 53       bipolar, dementia  . Cancer Maternal Aunt 70       breast ca  . Hypertension Father   . Stroke Father 69       deceased  . COPD Father   . Heart disease Sister        deceased    ALLERGIES:  is allergic to bee venom; dilaudid [hydromorphone hcl]; and morphine and related.  MEDICATIONS:  Current Outpatient Medications  Medication Sig Dispense Refill  . ALPRAZolam (XANAX) 0.25 MG tablet Take 1 tablet (0.25 mg total) by mouth 2 (two) times daily as needed for anxiety. 60 tablet 1  . Binimetinib 15 MG TABS Take 45 mg every 12 (twelve) hours by mouth. (Patient taking  differently: Take 30 mg by mouth every 12 (twelve) hours. ) 180 tablet 4  . Biotin w/ Vitamins C & E (HAIR/SKIN/NAILS PO) Take 1 tablet by mouth daily.    . Chlorpheniramine Maleate (CHLOR-TABLETS PO) Take 1 tablet by mouth every evening.    . clindamycin (CLINDAGEL) 1 % gel Apply topically 2 (two) times daily. Apply to affected areas twice a day. 30 g 0  . diphenoxylate-atropine (LOMOTIL) 2.5-0.025 MG tablet TAKE 1 TABLET FOUR TIMES DAILY AS NEEDEDFOR DIARRHEA OR LOOSE STOOLS 30 tablet 3  . Encorafenib 75 MG CAPS Take 300 mg daily by mouth. 120 capsule 4  . lidocaine-prilocaine (EMLA) cream Apply 1 application topically as needed. Apply to port a cath site 1 hour  prior to chemotherapy treatments 30 g 6  . loperamide (IMODIUM A-D) 2 MG tablet Take 2 mg by mouth 4 (four) times daily as needed for diarrhea or loose stools.    . metoprolol tartrate (LOPRESSOR) 25 MG tablet Take 1 tablet (25 mg total) by mouth 2 (two) times daily. 60 tablet 3  . minocycline (MINOCIN) 100 MG capsule Take 1 capsule (100 mg total) by mouth daily. 60 capsule 1  . naproxen sodium (ANAPROX) 220 MG tablet Take 440 mg by mouth 2 (two) times daily as needed.    . ondansetron (ZOFRAN) 8 MG tablet Take 1 tablet (8 mg total) by mouth every 8 (eight) hours as needed for nausea or vomiting. 40 tablet 3  . potassium chloride (K-DUR) 10 MEQ tablet TAKE ONE TABLET BY MOUTH TWICE DAILY 60 tablet 3  . prochlorperazine (COMPAZINE) 10 MG tablet TAKE ONE TABLET BY MOUTH EVERY 6 HOURS AS NEEDED FOR NAUSEA OR VOMITING 30 tablet 2  . triamcinolone ointment (KENALOG) 0.5 % Apply 1 application topically 2 (two) times daily. To rash 30 g 0   No current facility-administered medications for this visit.    Facility-Administered Medications Ordered in Other Visits  Medication Dose Route Frequency Provider Last Rate Last Dose  . heparin lock flush 100 unit/mL  500 Units Intravenous Once Charlaine Dalton R, MD      . heparin lock flush 100  unit/mL  500 Units Intravenous Once Charlaine Dalton R, MD      . sodium chloride flush (NS) 0.9 % injection 10 mL  10 mL Intravenous PRN [name], MD   10 mL at 11/24/15 0836      .  PHYSICAL EXAMINATION: ECOG PERFORMANCE STATUS: 0 - Asymptomatic  Vitals:   08/15/17 0940  BP: 131/84  Pulse: 69  Resp: 16  Temp: 98.2 F (36.8 C)   Filed Weights   08/15/17 0940  Weight: 198 lb 9.6 oz (90.1 kg)    GENERAL: Well-nourished well-developed; Alert, no distress and comfortable.  She is accompanied by her daughter EYES: no pallor or icterus OROPHARYNX: no thrush or ulceration; good dentition  NECK: supple, no masses felt LYMPH:  no palpable lymphadenopathy in the cervical, axillary or inguinal regions LUNGS: clear to auscultation and  No wheeze or crackles HEART/CVS: regular rate & rhythm and no murmurs; No lower extremity edema ABDOMEN: abdomen soft, non-tender and normal bowel sounds; positive for hepatomegaly. Musculoskeletal:no cyanosis of digits and no clubbing  PSYCH: alert & oriented x 3 with fluent speech NEURO: no focal motor/sensory deficits SKIN:  Acne-like rash noted on the torso/face.  LABORATORY DATA:  I have reviewed the data as listed Lab Results  Component Value Date   WBC 7.6 08/15/2017   HGB 10.7 (L) 08/15/2017   HCT 31.0 (L) 08/15/2017   MCV 95.8 08/15/2017   PLT 336 08/15/2017   Recent Labs    07/18/17 0804 08/01/17 0820 08/15/17 0909  NA 135 136 135  K 3.9 4.1 3.8  CL 107 109 109  CO2 19* 22 18*  GLUCOSE 82 85 103*  BUN 27* 19 21*  CREATININE 1.83* 1.53* 1.71*  CALCIUM 8.5* 8.1* 8.3*  GFRNONAA 28* 34* 30*  GFRAA 32* 40* 35*  PROT 6.5 6.3* 6.4*  ALBUMIN 2.8* 2.6* 2.6*  AST 32 28 31  ALT 24 20 21   ALKPHOS 158* 133* 152*  BILITOT 0.4 0.5 0.4    RADIOGRAPHIC STUDIES: I have personally reviewed the radiological images as listed and agreed with the  findings in the report. No results found.  Results for Kathleen, James  (MRN [id number]) as of 07/04/2017 09:30  Ref. Range 09/23/2015 15:04 11/03/2015 08:34 12/10/2015 08:51 12/24/2015 08:50 01/07/2016 09:07 02/02/2016 09:03 03/01/2016 11:00 03/29/2016 09:06 04/26/2016 10:31 05/24/2016 09:50 06/28/2016 08:32 08/09/2016 09:18 08/30/2016 10:13 09/13/2016 09:35 09/27/2016 09:35 10/11/2016 08:38 10/25/2016 08:19 11/04/2016 10:09 11/17/2016 08:29 12/29/2016 11:32 01/12/2017 09:29 01/26/2017 09:31 02/09/2017 09:02 02/28/2017 09:04 03/14/2017 11:30 03/23/2017 09:40 04/18/2017 10:15 04/25/2017 13:00 05/02/2017 08:25 05/16/2017 08:53 05/25/2017 10:50 05/31/2017 12:20 109-Jan-202018 11:52 06/28/2017 14:31  CEA Latest Ref Range: 0.0 - 4.7 ng/mL 538.7 (H) 860.5 (H) 1,167.0 (H) 658.6 (H) 415.6 (H) 242.1 (H) 173.6 (H) 118.8 (H) 100.6 (H) 91.5 (H) 94.5 (H) 141.5 (H) 181.2 (H) 197.3 (H) 215.3 (H) 254.9 (H) 300.7 (H) 254.9 (H) 268.6 (H)                 CEA Latest Ref Range: 0.0 - 4.7 ng/mL                    638.7 (H) 521.8 (H) 398.7 (H) 386.8 (H) 422.2 (H) 529.1 (H) 537.2 (H) 397.9 (H) 263.6 (H) 155.8 (H) 213.0 (H) 132.2 (H) 87.9 (H) 57.0 (H) 164.5 (H)   IMPRESSION: 1. Continued further decrease in size of the dominant posterior right hepatic dome lesion demonstrating no hypermetabolic FDG accumulation above background parenchymal levels on today's study. No other hypermetabolic liver metastases are evident on the current exam. 2. No hypermetabolic lymphadenopathy in the chest, abdomen, or pelvis to suggest distant hypermetabolic metastatic involvement. The aortocaval lymph node in question on the recent diagnostic CT scan has decreased in size in the interval. 3.  Aortic Atherosclerois (ICD10-170.0)   Electronically Signed   By: Misty Stanley M.D.   On: 07/14/2017 10:36  Results for Kathleen, James (MRN [id number]) as of 08/15/2017 09:57  Ref. 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 Jan 2019-PET - improved metastatic lesions to the liver; improved in retroperitoneal lymph nodes 3mm in size. CEA- 49 improving.   # Patient currently on Vectibex [cut dose to 4 mg/kg] continue Enco 3 pills/ Bini 2 pills BID.  Continue current therapy.  #Also discussed regarding follow-up at [facility] anticipation of any clinical trials/further treatment options if patient progresses on current therapy. appt on macrh 29th.   #Hypomagnesemia magnesium 1.4; secondary to vectibex; 2 g of mag today.  # Skin rash- sec to vectibex; improved/ clindamycin gel.  Continue minocycline  # acute renal failure- ? Etiology- creat 1.7 stable. Stop minocycline.  # follow up in 3 weeks/CEA/vectibex; mag infusion.  [vacation]    [name], MD 08/15/2017 12:54 PM

## 2017-08-16 LAB — CEA: CEA: 57.3 ng/mL — ABNORMAL HIGH (ref 0.0–4.7)

## 2017-09-05 ENCOUNTER — Inpatient Hospital Stay: Payer: Medicare HMO

## 2017-09-05 ENCOUNTER — Encounter: Payer: Self-pay | Admitting: Internal Medicine

## 2017-09-05 ENCOUNTER — Inpatient Hospital Stay: Payer: Medicare HMO | Attending: Internal Medicine | Admitting: Internal Medicine

## 2017-09-05 VITALS — BP 148/92 | HR 78 | Temp 97.9°F | Resp 16 | Wt 204.6 lb

## 2017-09-05 DIAGNOSIS — K921 Melena: Secondary | ICD-10-CM

## 2017-09-05 DIAGNOSIS — M1990 Unspecified osteoarthritis, unspecified site: Secondary | ICD-10-CM | POA: Diagnosis not present

## 2017-09-05 DIAGNOSIS — Z79899 Other long term (current) drug therapy: Secondary | ICD-10-CM

## 2017-09-05 DIAGNOSIS — I10 Essential (primary) hypertension: Secondary | ICD-10-CM | POA: Diagnosis not present

## 2017-09-05 DIAGNOSIS — D649 Anemia, unspecified: Secondary | ICD-10-CM | POA: Diagnosis not present

## 2017-09-05 DIAGNOSIS — N179 Acute kidney failure, unspecified: Secondary | ICD-10-CM | POA: Diagnosis not present

## 2017-09-05 DIAGNOSIS — C182 Malignant neoplasm of ascending colon: Secondary | ICD-10-CM

## 2017-09-05 DIAGNOSIS — Z5112 Encounter for antineoplastic immunotherapy: Secondary | ICD-10-CM | POA: Insufficient documentation

## 2017-09-05 DIAGNOSIS — R5383 Other fatigue: Secondary | ICD-10-CM | POA: Diagnosis not present

## 2017-09-05 DIAGNOSIS — C787 Secondary malignant neoplasm of liver and intrahepatic bile duct: Secondary | ICD-10-CM | POA: Diagnosis not present

## 2017-09-05 DIAGNOSIS — R21 Rash and other nonspecific skin eruption: Secondary | ICD-10-CM | POA: Diagnosis not present

## 2017-09-05 LAB — CBC WITH DIFFERENTIAL/PLATELET
BASOS ABS: 0.1 10*3/uL (ref 0–0.1)
BASOS PCT: 1 %
Eosinophils Absolute: 0.2 10*3/uL (ref 0–0.7)
Eosinophils Relative: 3 %
HEMATOCRIT: 25.8 % — AB (ref 35.0–47.0)
HEMOGLOBIN: 8.8 g/dL — AB (ref 12.0–16.0)
LYMPHS PCT: 13 %
Lymphs Abs: 0.7 10*3/uL — ABNORMAL LOW (ref 1.0–3.6)
MCH: 32 pg (ref 26.0–34.0)
MCHC: 34 g/dL (ref 32.0–36.0)
MCV: 94 fL (ref 80.0–100.0)
MONO ABS: 0.4 10*3/uL (ref 0.2–0.9)
MONOS PCT: 8 %
NEUTROS PCT: 75 %
Neutro Abs: 4.3 10*3/uL (ref 1.4–6.5)
Platelets: 317 10*3/uL (ref 150–440)
RBC: 2.74 MIL/uL — ABNORMAL LOW (ref 3.80–5.20)
RDW: 14.6 % — AB (ref 11.5–14.5)
WBC: 5.7 10*3/uL (ref 3.6–11.0)

## 2017-09-05 LAB — COMPREHENSIVE METABOLIC PANEL
ALBUMIN: 2.7 g/dL — AB (ref 3.5–5.0)
ALT: 16 U/L (ref 14–54)
AST: 28 U/L (ref 15–41)
Alkaline Phosphatase: 101 U/L (ref 38–126)
Anion gap: 8 (ref 5–15)
BUN: 22 mg/dL — ABNORMAL HIGH (ref 6–20)
CO2: 21 mmol/L — AB (ref 22–32)
Calcium: 8.5 mg/dL — ABNORMAL LOW (ref 8.9–10.3)
Chloride: 111 mmol/L (ref 101–111)
Creatinine, Ser: 1.72 mg/dL — ABNORMAL HIGH (ref 0.44–1.00)
GFR calc non Af Amer: 30 mL/min — ABNORMAL LOW (ref >60)
GFR, EST AFRICAN AMERICAN: 34 mL/min — AB (ref >60)
GLUCOSE: 112 mg/dL — AB (ref 65–99)
POTASSIUM: 4.1 mmol/L (ref 3.5–5.1)
SODIUM: 140 mmol/L (ref 135–145)
Total Bilirubin: 0.5 mg/dL (ref 0.3–1.2)
Total Protein: 6.4 g/dL — ABNORMAL LOW (ref 6.5–8.1)

## 2017-09-05 LAB — MAGNESIUM: MAGNESIUM: 1.5 mg/dL — AB (ref 1.7–2.4)

## 2017-09-05 MED ORDER — SODIUM CHLORIDE 0.9% FLUSH
10.0000 mL | Freq: Once | INTRAVENOUS | Status: AC
  Administered 2017-09-05: 10 mL via INTRAVENOUS
  Filled 2017-09-05: qty 10

## 2017-09-05 MED ORDER — HEPARIN SOD (PORK) LOCK FLUSH 100 UNIT/ML IV SOLN
500.0000 [IU] | Freq: Once | INTRAVENOUS | Status: AC
  Administered 2017-09-05: 500 [IU] via INTRAVENOUS
  Filled 2017-09-05: qty 5

## 2017-09-05 MED ORDER — SODIUM CHLORIDE 0.9 % IV SOLN
Freq: Once | INTRAVENOUS | Status: DC
  Filled 2017-09-05: qty 4

## 2017-09-05 MED ORDER — ONDANSETRON HCL 4 MG/2ML IJ SOLN
8.0000 mg | Freq: Once | INTRAMUSCULAR | Status: AC
  Administered 2017-09-05: 8 mg via INTRAVENOUS
  Filled 2017-09-05: qty 4

## 2017-09-05 MED ORDER — MAGNESIUM SULFATE 2 GM/50ML IV SOLN
2.0000 g | Freq: Once | INTRAVENOUS | Status: AC
  Administered 2017-09-05: 2 g via INTRAVENOUS
  Filled 2017-09-05: qty 50

## 2017-09-05 MED ORDER — SODIUM CHLORIDE 0.9 % IV SOLN
Freq: Once | INTRAVENOUS | Status: AC
  Administered 2017-09-05: 11:00:00 via INTRAVENOUS
  Filled 2017-09-05: qty 1000

## 2017-09-05 MED ORDER — SODIUM CHLORIDE 0.9 % IV SOLN
4.0000 mg/kg | Freq: Once | INTRAVENOUS | Status: AC
  Administered 2017-09-05: 360 mg via INTRAVENOUS
  Filled 2017-09-05: qty 18

## 2017-09-05 MED ORDER — DEXAMETHASONE SODIUM PHOSPHATE 10 MG/ML IJ SOLN
10.0000 mg | Freq: Once | INTRAMUSCULAR | Status: AC
  Administered 2017-09-05: 10 mg via INTRAVENOUS
  Filled 2017-09-05: qty 1

## 2017-09-05 NOTE — Assessment & Plan Note (Addendum)
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 Jan 24th 2019-PET - improved metastatic lesions to the liver; improved in retroperitoneal lymph nodes 18mm in size. CEA- 49-53/ stable; awaiting from today.    # Patient currently on Vectibex [cut dose to 4 mg/kg] continue Enco 3 pills/ Bini 2 pills BID.  CBC CMP are reviewed; adequate today-except for hemoglobin 8.8; no contraindications to chemotherapy. Proceed with treatment today.    # Also discussed regarding follow-up at [facility] anticipation of any clinical trials/further treatment options if patient progresses on current therapy.  Patient is appointment on March 21.   #Hypomagnesemia magnesium 1.5; secondary to vectibex; 2 g of mag today.  # Skin rash- sec to vectibex; improved/ clindamycin gel.   # acute renal failure/CKD- ? Etiology- creat 1.7 stable. appt with Dr.Singh on 29th   # follow up in 2 weeks/CEA/vectibex; mag infusion [appt- 3/21-[facility]]

## 2017-09-05 NOTE — Progress Notes (Signed)
Kathleen James NOTE  [name], [name], MD (Internal Medicine) Bary Castilla, Forest Gleason, [name], RN as Registered Nurse [name], MD as Consulting Physician (Internal Medicine)  CHIEF COMPLAINTS/PURPOSE OF CONSULTATION:   Oncology History   # MARCH/APRIL 2017-NEUROENDOCRINE CA STAGE IV; [name]; [name]; [name]; [name]; [name]; [name]; [name]; July 14th  2017 CT- PR  # AUG 28th- FOLFIRI +Avastin; OCT 5th CT scan- PR.   # MARCH 20th- CT STABLE liver lesions; ? Enlarging cecal lesion [CEA rising]; March 26th- FOLFOX [last avastin march 12th 2018];   # resection of primary tumor [Dr.Byrnett]; y90- June 19th 2018.  # July 11th-FOLFOX + Avastin; SEP 2018 [CT- stable liver lesions; increasing ~42mm RP LN]; RISING CEA [stopped end of Sep 2018 ]  # OCT 15th 2018- Vemu+ Iri+ Pan [mid nov stopped iri- neutropenia/dia+vemu- arthralgias]; 05/11/2017-Started enco+Bini+vectibex   # [facility]- B-RAF V600E- MUTATED [May 2018]  # right kidney lesion 2.6 x 1.6 cm ? Angiolipoma [followed by Urology in past]     Malignant neoplasm of ascending colon ([place])     HISTORY OF PRESENTING ILLNESS:  Kathleen James 67 y.o.  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  Patient continues to have mild rash.  Denies any significant itch.  Minocycline has been taken off because of the renal insufficiency.  Patient denies any significant diarrhea.  Mild fatigue overall  well controlled.  She is awaiting a repeat appointment at [facility] to discuss any further chemotherapy options clinical trial options if she progresses on the current therapy.  ROS: A complete 10 point review of system is done which is negative except mentioned above in history of present illness  MEDICAL HISTORY:  Past Medical History:  Diagnosis Date  . Angiolipoma of kidney    followed with serial CTs ,,  Dr. Jacqlyn Larsen  . Anxiety   . Arthritis   . 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 Chemotherapy induced diarrhea   . Hammer toe   . Hypertension   . Liver cancer ([place])   . Neuro-endocrine carcinoma ([place])   . Obesity (BMI 30-39.9)     SURGICAL HISTORY: Past Surgical History:  Procedure Laterality Date  . ABDOMINAL HYSTERECTOMY  1995   endometriosis, heavy bleeding  . BUNIONECTOMY Bilateral   . CESAREAN SECTION  1985  . COLONOSCOPY WITH PROPOFOL N/A 10/09/2015   Procedure: COLONOSCOPY WITH PROPOFOL;  Surgeon: Robert Bellow, MD;  Location[facility] ENDOSCOPY;  Service: Endoscopy;  Laterality: N/A;  . IR ANGIOGRAM SELECTIVE EACH ADDITIONAL VESSEL  11/22/2016  . IR ANGIOGRAM SELECTIVE EACH ADDITIONAL VESSEL  11/22/2016  . IR ANGIOGRAM SELECTIVE EACH ADDITIONAL VESSEL  11/22/2016  . IR ANGIOGRAM SELECTIVE EACH ADDITIONAL VESSEL  12/07/2016  . IR ANGIOGRAM SELECTIVE EACH ADDITIONAL VESSEL  12/07/2016  . IR ANGIOGRAM VISCERAL SELECTIVE  11/22/2016  . IR ANGIOGRAM VISCERAL SELECTIVE  12/07/2016  . IR EMBO ARTERIAL NOT HEMORR HEMANG INC GUIDE ROADMAPPING  11/22/2016  . IR EMBO TUMOR  ORGAN ISCHEMIA INFARCT INC GUIDE ROADMAPPING  12/07/2016  . IR GENERIC HISTORICAL  04/07/2016   IR RADIOLOGIST EVAL & MGMT 04/07/2016 Arne Cleveland, MD [facility] INTERV RAD  . IR RADIOLOGIST EVAL & MGMT  04/14/2017  . IR US GUIDE VASC ACCESS RIGHT  11/22/2016  . IR US GUIDE VASC ACCESS RIGHT  12/07/2016  . LAPAROSCOPIC RIGHT COLECTOMY Right 10/26/2016   Procedure: LAPAROSCOPIC  RIGHT COLECTOMY;  Surgeon: Robert Bellow, MD;  Location: [facility] ORS;  Service: General;  Laterality: Right;  . OOPHORECTOMY    . PORTACATH PLACEMENT Left 10/01/2015   Procedure: INSERTION PORT-A-CATH;  Surgeon: Robert Bellow, MD;  Location: [facility] ORS;  Service: General;  Laterality: Left;  . ROTATOR CUFF REPAIR Right 2008   right shoulder.  [place]     SOCIAL HISTORY: She lives at home with her family in [place]. 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 Social History   Socioeconomic History  . Marital status: Married    Spouse name: Not on file  . Number of children: Not on file  . Years of education: Not on file  . Highest education level: Not on file  Social Needs  . Financial resource strain: Not on file  . Food insecurity - worry: Not on file  . Food insecurity - inability: Not on file  . Transportation needs - medical: Not on file  . Transportation needs - non-medical: Not on file  Occupational History  . Not on file  Tobacco Use  . Smoking status: Never Smoker  . Smokeless tobacco: Never Used  Substance and Sexual Activity  . Alcohol use: Yes    Alcohol/week: 0.0 oz    Comment: occasionally  . Drug use: No  . Sexual activity: Not Currently  Other Topics Concern  . Not on file  Social History Narrative  . Not on file    FAMILY HISTORY: Family History  Problem Relation Age of Onset  . Mental illness Mother 94       bipolar, dementia  . Cancer Maternal Aunt 70       breast ca  . Hypertension Father   . Stroke Father 22       deceased  . COPD Father   . Heart disease Sister        deceased    ALLERGIES:  is allergic to bee venom; dilaudid [hydromorphone hcl]; and morphine and related.  MEDICATIONS:  Current Outpatient Medications  Medication Sig Dispense Refill  . ALPRAZolam (XANAX) 0.25 MG tablet Take 1 tablet (0.25 mg total) by mouth 2 (two) times daily as needed for anxiety. 60 tablet 1  . Binimetinib 15  MG TABS Take 45 mg every 12 (twelve) hours by mouth. (Patient taking differently: Take 30 mg by mouth every 12 (twelve) hours. ) 180 tablet 4  . Biotin w/ Vitamins C & E (HAIR/SKIN/NAILS PO) Take 1 tablet by mouth daily.    . Chlorpheniramine Maleate (CHLOR-TABLETS PO) Take 1 tablet by mouth every evening.    . clindamycin (CLINDAGEL) 1 % gel Apply topically 2 (two) times daily. Apply to affected areas twice a day. 30 g 0  . diphenoxylate-atropine (LOMOTIL) 2.5-0.025 MG tablet TAKE 1 TABLET FOUR TIMES DAILY AS NEEDEDFOR DIARRHEA OR LOOSE STOOLS 30 tablet 3  . Encorafenib 75 MG CAPS Take 300 mg daily by mouth. 120 capsule 4  . lidocaine-prilocaine (EMLA) cream Apply 1 application topically as needed. Apply to port a cath site  1 hour prior to chemotherapy treatments 30 g 6  . loperamide (IMODIUM A-D) 2 MG tablet Take 2 mg by mouth 4 (four) times daily as needed for diarrhea or loose stools.    . metoprolol tartrate (LOPRESSOR) 25 MG tablet Take 1 tablet (25 mg total) by mouth 2 (two) times daily. 60 tablet 3  . minocycline (MINOCIN) 100 MG capsule Take 1 capsule (100 mg total) by mouth daily. 60 capsule 1  . naproxen sodium (ANAPROX) 220 MG tablet Take 440 mg by mouth 2 (two) times daily as needed.    . ondansetron (ZOFRAN) 8 MG tablet Take 1 tablet (8 mg total) by mouth every 8 (eight) hours as needed for nausea or vomiting. 40 tablet 3  . potassium chloride (K-DUR) 10 MEQ tablet TAKE ONE TABLET BY MOUTH TWICE DAILY 60 tablet 3  . prochlorperazine (COMPAZINE) 10 MG tablet TAKE ONE TABLET BY MOUTH EVERY 6 HOURS AS NEEDED FOR NAUSEA OR VOMITING 30 tablet 2  . triamcinolone ointment (KENALOG) 0.5 % Apply 1 application topically 2 (two) times daily. To rash 30 g 0   No current facility-administered medications for this visit.    Facility-Administered Medications Ordered in Other Visits  Medication Dose Route Frequency Provider Last Rate Last Dose  . heparin lock flush 100 unit/mL  500 Units  Intravenous Once Charlaine Dalton R, MD      . sodium chloride flush (NS) 0.9 % injection 10 mL  10 mL Intravenous PRN [name], MD   10 mL at 11/24/15 0836      .  PHYSICAL EXAMINATION: ECOG PERFORMANCE STATUS: 0 - Asymptomatic  Vitals:   09/05/17 0951  BP: (!) 148/92  Pulse: 78  Resp: 16  Temp: 97.9 F (36.6 C)   Filed Weights   09/05/17 0951  Weight: 204 lb 9.6 oz (92.8 kg)    GENERAL: Well-nourished well-developed; Alert, no distress and comfortable.  She is alone. EYES: no pallor or icterus OROPHARYNX: no thrush or ulceration; good dentition  NECK: supple, no masses felt LYMPH:  no palpable lymphadenopathy in the cervical, axillary or inguinal regions LUNGS: clear to auscultation and  No wheeze or crackles HEART/CVS: regular rate & rhythm and no murmurs; No lower extremity edema ABDOMEN: abdomen soft, non-tender and normal bowel sounds; positive for hepatomegaly. Musculoskeletal:no cyanosis of digits and no clubbing  PSYCH: alert & oriented x 3 with fluent speech NEURO: no focal motor/sensory deficits SKIN:  Acne-like rash noted on the torso/face.  LABORATORY DATA:  I have reviewed the data as listed Lab Results  Component Value Date   WBC 5.7 09/05/2017   HGB 8.8 (L) 09/05/2017   HCT 25.8 (L) 09/05/2017   MCV 94.0 09/05/2017   PLT 317 09/05/2017   Recent Labs    08/01/17 0820 08/15/17 0909 09/05/17 0920  NA 136 135 140  K 4.1 3.8 4.1  CL 109 109 111  CO2 22 18* 21*  GLUCOSE 85 103* 112*  BUN 19 21* 22*  CREATININE 1.53* 1.71* 1.72*  CALCIUM 8.1* 8.3* 8.5*  GFRNONAA 34* 30* 30*  GFRAA 40* 35* 34*  PROT 6.3* 6.4* 6.4*  ALBUMIN 2.6* 2.6* 2.7*  AST 28 31 28   ALT 20 21 16   ALKPHOS 133* 152* 101  BILITOT 0.5 0.4 0.5    RADIOGRAPHIC STUDIES: I have personally reviewed the radiological images as listed and agreed with the findings in the report. No results found.  Results for SHEKELA, GOODRIDGE (MRN [id number]) as of 07/04/2017  09:30  Ref. Range 09/23/2015 15:04 11/03/2015 08:34 12/10/2015 08:51 12/24/2015 08:50 01/07/2016 09:07 02/02/2016 09:03 03/01/2016 11:00 03/29/2016 09:06 04/26/2016 10:31 05/24/2016 09:50 06/28/2016 08:32 08/09/2016 09:18 08/30/2016 10:13 09/13/2016 09:35 09/27/2016 09:35 10/11/2016 08:38 10/25/2016 08:19 11/04/2016 10:09 11/17/2016 08:29 12/29/2016 11:32 01/12/2017 09:29 01/26/2017 09:31 02/09/2017 09:02 02/28/2017 09:04 03/14/2017 11:30 03/23/2017 09:40 04/18/2017 10:15 04/25/2017 13:00 05/02/2017 08:25 05/16/2017 08:53 05/25/2017 10:50 05/31/2017 12:20 102-May-202018 11:52 06/28/2017 14:31  CEA Latest Ref Range: 0.0 - 4.7 ng/mL 538.7 (H) 860.5 (H) 1,167.0 (H) 658.6 (H) 415.6 (H) 242.1 (H) 173.6 (H) 118.8 (H) 100.6 (H) 91.5 (H) 94.5 (H) 141.5 (H) 181.2 (H) 197.3 (H) 215.3 (H) 254.9 (H) 300.7 (H) 254.9 (H) 268.6 (H)                 CEA Latest Ref Range: 0.0 - 4.7 ng/mL                    638.7 (H) 521.8 (H) 398.7 (H) 386.8 (H) 422.2 (H) 529.1 (H) 537.2 (H) 397.9 (H) 263.6 (H) 155.8 (H) 213.0 (H) 132.2 (H) 87.9 (H) 57.0 (H) 164.5 (H)   IMPRESSION: 1. Continued further decrease in size of the dominant posterior right hepatic dome lesion demonstrating no hypermetabolic FDG accumulation above background parenchymal levels on today's study. No other hypermetabolic liver metastases are evident on the current exam. 2. No hypermetabolic lymphadenopathy in the chest, abdomen, or pelvis to suggest distant hypermetabolic metastatic involvement. The aortocaval lymph node in question on the recent diagnostic CT scan has decreased in size in the interval. 3.  Aortic Atherosclerois (ICD10-170.0)   Electronically Signed   By: Misty Stanley M.D.   On: 07/14/2017 10:36  Results for ZERIYAH, WAIN (MRN [id number]) as of 09/05/2017 10:14  Ref. 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 Jan 24th 2019-PET - improved metastatic lesions to the liver; improved in retroperitoneal lymph nodes 28mm in size. CEA- 49-53/ stable; awaiting from today.    # Patient currently on Vectibex [cut dose to 4 mg/kg] continue Enco 3 pills/ Bini 2 pills BID.  CBC CMP are reviewed; adequate today-except for hemoglobin 8.8; no contraindications to chemotherapy. Proceed with treatment today.    # Also discussed regarding follow-up at [facility] anticipation of any clinical trials/further treatment options if patient progresses on current therapy.  Patient is appointment on March 21.   #Hypomagnesemia magnesium 1.5; secondary to vectibex; 2 g of mag today.  # Skin rash- sec to vectibex; improved/ clindamycin gel.   # acute renal failure/CKD- ? Etiology- creat 1.7 stable. appt with Dr.Singh on 29th   # follow up in 2 weeks/CEA/vectibex; mag infusion [appt- 3/21-[facility]]    [name], MD 09/05/2017 6:03 PM

## 2017-09-06 ENCOUNTER — Other Ambulatory Visit: Payer: Self-pay

## 2017-09-06 ENCOUNTER — Inpatient Hospital Stay (HOSPITAL_BASED_OUTPATIENT_CLINIC_OR_DEPARTMENT_OTHER): Payer: Medicare HMO | Admitting: Oncology

## 2017-09-06 ENCOUNTER — Inpatient Hospital Stay: Payer: Medicare HMO

## 2017-09-06 ENCOUNTER — Inpatient Hospital Stay
Admission: AD | Admit: 2017-09-06 | Discharge: 2017-09-07 | DRG: 381 | Disposition: A | Discharge status: Home / Self Care | Payer: MEDICARE | Source: Ambulatory Visit | Attending: Internal Medicine | Admitting: Internal Medicine

## 2017-09-06 ENCOUNTER — Telehealth: Payer: Self-pay | Admitting: *Deleted

## 2017-09-06 DIAGNOSIS — Z6834 Body mass index (BMI) 34.0-34.9, adult: Secondary | ICD-10-CM

## 2017-09-06 DIAGNOSIS — D62 Acute posthemorrhagic anemia: Secondary | ICD-10-CM | POA: Diagnosis not present

## 2017-09-06 DIAGNOSIS — K625 Hemorrhage of anus and rectum: Secondary | ICD-10-CM | POA: Diagnosis present

## 2017-09-06 DIAGNOSIS — K289 Gastrojejunal ulcer, unspecified as acute or chronic, without hemorrhage or perforation: Principal | ICD-10-CM | POA: Diagnosis present

## 2017-09-06 DIAGNOSIS — Z8719 Personal history of other diseases of the digestive system: Secondary | ICD-10-CM | POA: Diagnosis not present

## 2017-09-06 DIAGNOSIS — C189 Malignant neoplasm of colon, unspecified: Secondary | ICD-10-CM

## 2017-09-06 DIAGNOSIS — C787 Secondary malignant neoplasm of liver and intrahepatic bile duct: Secondary | ICD-10-CM

## 2017-09-06 DIAGNOSIS — D649 Anemia, unspecified: Secondary | ICD-10-CM | POA: Diagnosis not present

## 2017-09-06 DIAGNOSIS — R21 Rash and other nonspecific skin eruption: Secondary | ICD-10-CM | POA: Diagnosis not present

## 2017-09-06 DIAGNOSIS — D630 Anemia in neoplastic disease: Secondary | ICD-10-CM | POA: Diagnosis present

## 2017-09-06 DIAGNOSIS — M1990 Unspecified osteoarthritis, unspecified site: Secondary | ICD-10-CM | POA: Diagnosis not present

## 2017-09-06 DIAGNOSIS — Z5112 Encounter for antineoplastic immunotherapy: Secondary | ICD-10-CM | POA: Diagnosis present

## 2017-09-06 DIAGNOSIS — R5383 Other fatigue: Secondary | ICD-10-CM

## 2017-09-06 DIAGNOSIS — J309 Allergic rhinitis, unspecified: Secondary | ICD-10-CM | POA: Diagnosis not present

## 2017-09-06 DIAGNOSIS — Z885 Allergy status to narcotic agent status: Secondary | ICD-10-CM

## 2017-09-06 DIAGNOSIS — Z79899 Other long term (current) drug therapy: Secondary | ICD-10-CM

## 2017-09-06 DIAGNOSIS — C182 Malignant neoplasm of ascending colon: Secondary | ICD-10-CM

## 2017-09-06 DIAGNOSIS — K5791 Diverticulosis of intestine, part unspecified, without perforation or abscess with bleeding: Secondary | ICD-10-CM | POA: Diagnosis not present

## 2017-09-06 DIAGNOSIS — K219 Gastro-esophageal reflux disease without esophagitis: Secondary | ICD-10-CM | POA: Diagnosis not present

## 2017-09-06 DIAGNOSIS — K5730 Diverticulosis of large intestine without perforation or abscess without bleeding: Secondary | ICD-10-CM | POA: Diagnosis present

## 2017-09-06 DIAGNOSIS — K5289 Other specified noninfective gastroenteritis and colitis: Secondary | ICD-10-CM | POA: Diagnosis not present

## 2017-09-06 DIAGNOSIS — N179 Acute kidney failure, unspecified: Secondary | ICD-10-CM

## 2017-09-06 DIAGNOSIS — K922 Gastrointestinal hemorrhage, unspecified: Secondary | ICD-10-CM | POA: Diagnosis not present

## 2017-09-06 DIAGNOSIS — K921 Melena: Secondary | ICD-10-CM

## 2017-09-06 DIAGNOSIS — Z66 Do not resuscitate: Secondary | ICD-10-CM | POA: Diagnosis not present

## 2017-09-06 DIAGNOSIS — R69 Illness, unspecified: Secondary | ICD-10-CM | POA: Diagnosis not present

## 2017-09-06 DIAGNOSIS — I10 Essential (primary) hypertension: Secondary | ICD-10-CM | POA: Diagnosis present

## 2017-09-06 DIAGNOSIS — Z91030 Bee allergy status: Secondary | ICD-10-CM | POA: Diagnosis not present

## 2017-09-06 DIAGNOSIS — F419 Anxiety disorder, unspecified: Secondary | ICD-10-CM | POA: Diagnosis present

## 2017-09-06 DIAGNOSIS — Z9221 Personal history of antineoplastic chemotherapy: Secondary | ICD-10-CM

## 2017-09-06 DIAGNOSIS — E669 Obesity, unspecified: Secondary | ICD-10-CM | POA: Diagnosis not present

## 2017-09-06 DIAGNOSIS — C799 Secondary malignant neoplasm of unspecified site: Secondary | ICD-10-CM

## 2017-09-06 DIAGNOSIS — K633 Ulcer of intestine: Secondary | ICD-10-CM | POA: Diagnosis not present

## 2017-09-06 LAB — CBC WITH DIFFERENTIAL/PLATELET
BASOS ABS: 0.1 10*3/uL (ref 0–0.1)
Basophils Relative: 1 %
EOS PCT: 1 %
Eosinophils Absolute: 0.1 10*3/uL (ref 0–0.7)
HCT: 25.2 % — ABNORMAL LOW (ref 35.0–47.0)
Hemoglobin: 8.3 g/dL — ABNORMAL LOW (ref 12.0–16.0)
LYMPHS PCT: 9 %
Lymphs Abs: 0.8 10*3/uL — ABNORMAL LOW (ref 1.0–3.6)
MCH: 31.2 pg (ref 26.0–34.0)
MCHC: 33.1 g/dL (ref 32.0–36.0)
MCV: 94.3 fL (ref 80.0–100.0)
MONO ABS: 0.6 10*3/uL (ref 0.2–0.9)
MONOS PCT: 7 %
Neutro Abs: 7.1 10*3/uL — ABNORMAL HIGH (ref 1.4–6.5)
Neutrophils Relative %: 82 %
PLATELETS: 307 10*3/uL (ref 150–440)
RBC: 2.67 MIL/uL — ABNORMAL LOW (ref 3.80–5.20)
RDW: 14.8 % — AB (ref 11.5–14.5)
WBC: 8.7 10*3/uL (ref 3.6–11.0)

## 2017-09-06 LAB — COMPREHENSIVE METABOLIC PANEL
ALBUMIN: 2.9 g/dL — AB (ref 3.5–5.0)
ALK PHOS: 91 U/L (ref 38–126)
ALT: 17 U/L (ref 14–54)
AST: 26 U/L (ref 15–41)
Anion gap: 7 (ref 5–15)
BILIRUBIN TOTAL: 0.6 mg/dL (ref 0.3–1.2)
BUN: 25 mg/dL — AB (ref 6–20)
CO2: 21 mmol/L — AB (ref 22–32)
Calcium: 8.4 mg/dL — ABNORMAL LOW (ref 8.9–10.3)
Chloride: 108 mmol/L (ref 101–111)
Creatinine, Ser: 1.75 mg/dL — ABNORMAL HIGH (ref 0.44–1.00)
GFR calc Af Amer: 34 mL/min — ABNORMAL LOW (ref >60)
GFR calc non Af Amer: 29 mL/min — ABNORMAL LOW (ref >60)
GLUCOSE: 86 mg/dL (ref 65–99)
Potassium: 4.7 mmol/L (ref 3.5–5.1)
SODIUM: 136 mmol/L (ref 135–145)
TOTAL PROTEIN: 6.3 g/dL — AB (ref 6.5–8.1)

## 2017-09-06 LAB — PROTIME-INR
INR: 1.03
Prothrombin Time: 13.4 seconds (ref 11.4–15.2)

## 2017-09-06 LAB — APTT: aPTT: 26 seconds (ref 24–36)

## 2017-09-06 LAB — CEA: CEA1: 92.9 ng/mL — AB (ref 0.0–4.7)

## 2017-09-06 MED ORDER — SODIUM CHLORIDE 0.9% FLUSH: 10.0000 mL | INTRAVENOUS | Status: DC | PRN

## 2017-09-06 MED ORDER — ONDANSETRON HCL 4 MG PO TABS: 4.0000 mg | ORAL_TABLET | Freq: Four times a day (QID) | ORAL | Status: DC | PRN

## 2017-09-06 MED ORDER — SODIUM CHLORIDE 0.9 % IV SOLN
Freq: Once | INTRAVENOUS | Status: AC
  Administered 2017-09-06: 17:00:00 via INTRAVENOUS

## 2017-09-06 MED ORDER — PEG 3350-KCL-NA BICARB-NACL 420 G PO SOLR
4000.0000 mL | Freq: Once | ORAL | Status: AC
  Administered 2017-09-06: 19:00:00 4000 mL via ORAL
  Filled 2017-09-06: qty 4000

## 2017-09-06 MED ORDER — ACETAMINOPHEN 325 MG PO TABS
650.0000 mg | ORAL_TABLET | Freq: Four times a day (QID) | ORAL | Status: DC | PRN
  Administered 2017-09-07: 12:00:00 650 mg via ORAL
  Filled 2017-09-06: qty 2

## 2017-09-06 MED ORDER — SODIUM CHLORIDE 0.9 % IV SOLN
INTRAVENOUS | Status: DC
  Administered 2017-09-07: 20 mL/h via INTRAVENOUS
  Administered 2017-09-07: 14:00:00 via INTRAVENOUS

## 2017-09-06 MED ORDER — METOPROLOL TARTRATE 25 MG PO TABS
25.0000 mg | ORAL_TABLET | Freq: Two times a day (BID) | ORAL | Status: DC
  Administered 2017-09-06 – 2017-09-07 (×2): 25 mg via ORAL
  Filled 2017-09-06 (×2): qty 1

## 2017-09-06 MED ORDER — ACETAMINOPHEN 650 MG RE SUPP: 650.0000 mg | Freq: Four times a day (QID) | RECTAL | Status: DC | PRN

## 2017-09-06 MED ORDER — ONDANSETRON HCL 4 MG/2ML IJ SOLN
4.0000 mg | Freq: Four times a day (QID) | INTRAMUSCULAR | Status: DC | PRN
  Administered 2017-09-07 (×2): 4 mg via INTRAVENOUS
  Filled 2017-09-06 (×3): qty 2

## 2017-09-06 NOTE — H&P (Signed)
[place] [facility] [place] NAME: Kathleen James    MR#:  [id number]  DATE OF BIRTH:  20-Jul-1950  DATE OF ADMISSION:  09/06/2017  PRIMARY CARE PHYSICIAN: [name], MD   REQUESTING/REFERRING PHYSICIAN: Dr Rogue Bussing  CHIEF COMPLAINT:   Bright red blood per rectum since today HISTORY OF PRESENT ILLNESS:  Kathleen James  is a 67 y.o. 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  Patient said she had 2-3 bowel movements with dark red blood per rectum.  It was mixed with stool.  She has on and off history of diarrhea.  Her hemoglobin has dropped down a few points and is being admitted for further vaginal management. Denies any vomiting or hematemesis.  Denies any abdominal pain shortness of breath or chest pain  PAST MEDICAL HISTORY:   Past Medical History:  Diagnosis Date  . Angiolipoma of kidney    followed with serial CTs ,,  Dr. Jacqlyn Larsen  . Anxiety   . Arthritis   . 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 Chemotherapy induced diarrhea   . Hammer toe   . Hypertension   . Liver cancer ([place])   . Neuro-endocrine carcinoma ([place])   . Obesity (BMI 30-39.9)     PAST SURGICAL HISTOIRY:   Past Surgical History:  Procedure Laterality Date  . ABDOMINAL HYSTERECTOMY  1995   endometriosis, heavy bleeding  . BUNIONECTOMY Bilateral   . CESAREAN SECTION  1985  . COLONOSCOPY WITH PROPOFOL N/A 10/09/2015   Procedure: COLONOSCOPY WITH PROPOFOL;  Surgeon: Robert Bellow, MD;  Location: [facility] ENDOSCOPY;  Service: Endoscopy;  Laterality: N/A;  . IR ANGIOGRAM SELECTIVE EACH ADDITIONAL VESSEL  11/22/2016  . IR ANGIOGRAM SELECTIVE EACH ADDITIONAL VESSEL  11/22/2016  . IR ANGIOGRAM SELECTIVE  EACH ADDITIONAL VESSEL  11/22/2016  . IR ANGIOGRAM SELECTIVE EACH ADDITIONAL VESSEL  12/07/2016  . IR ANGIOGRAM SELECTIVE EACH ADDITIONAL VESSEL  12/07/2016  . IR ANGIOGRAM VISCERAL SELECTIVE  11/22/2016  . IR ANGIOGRAM VISCERAL SELECTIVE  12/07/2016  . IR EMBO ARTERIAL NOT HEMORR HEMANG INC GUIDE ROADMAPPING  11/22/2016  . IR EMBO TUMOR ORGAN ISCHEMIA INFARCT INC GUIDE ROADMAPPING  12/07/2016  . IR GENERIC HISTORICAL  04/07/2016   IR RADIOLOGIST EVAL & MGMT 04/07/2016 Arne Cleveland, MD [facility] INTERV RAD  . IR RADIOLOGIST EVAL & MGMT  04/14/2017  . IR US GUIDE VASC ACCESS RIGHT  11/22/2016  . IR US GUIDE VASC ACCESS RIGHT  12/07/2016  . LAPAROSCOPIC RIGHT COLECTOMY Right 10/26/2016   Procedure: LAPAROSCOPIC RIGHT COLECTOMY;  Surgeon: Robert Bellow, MD;  Location: [facility] ORS;  Service: General;  Laterality: Right;  . OOPHORECTOMY    . PORTACATH PLACEMENT Left 10/01/2015   Procedure: INSERTION PORT-A-CATH;  Surgeon: Robert Bellow, MD;  Location: [facility] ORS;  Service: General;  Laterality: Left;  . ROTATOR CUFF REPAIR Right 2008   right shoulder.  [place]     SOCIAL HISTORY:   Social History   Tobacco Use  . Smoking status: Never Smoker  . Smokeless tobacco: Never Used  Substance Use Topics  . Alcohol use: Yes    Alcohol/week: 0.0 oz    Comment: occasionally    FAMILY HISTORY:   Family History  Problem Relation Age of Onset  .  Mental illness Mother 77       bipolar, dementia  . Cancer Maternal Aunt 70       breast ca  . Hypertension Father   . Stroke Father 8       deceased  . COPD Father   . Heart disease Sister        deceased    DRUG ALLERGIES:   Allergies  Allergen Reactions  . Bee Venom Hives    Has epi-pen  . Dilaudid [Hydromorphone Hcl] Nausea And Vomiting  . Morphine And Related Nausea Only    REVIEW OF SYSTEMS:  Review of Systems  Constitutional: Negative for chills, fever and weight loss.  HENT: Negative for ear discharge, ear pain and nosebleeds.   Eyes:  Negative for blurred vision, pain and discharge.  Respiratory: Negative for sputum production, shortness of breath, wheezing and stridor.   Cardiovascular: Negative for chest pain, palpitations, orthopnea and PND.  Gastrointestinal: Positive for blood in stool and diarrhea. Negative for abdominal pain, nausea and vomiting.  Genitourinary: Negative for frequency and urgency.  Musculoskeletal: Negative for back pain and joint pain.  Neurological: Negative for sensory change, speech change, focal weakness and weakness.  Psychiatric/Behavioral: Negative for depression and hallucinations. The patient is not nervous/anxious.      MEDICATIONS AT HOME:   Prior to Admission medications   Medication Sig Start Date End Date Taking? Authorizing Provider  ALPRAZolam (XANAX) 0.25 MG tablet Take 1 tablet (0.25 mg total) by mouth 2 (two) times daily as needed for anxiety. 04/21/17  Yes [name], MD  Binimetinib 15 MG TABS Take 45 mg every 12 (twelve) hours by mouth. Patient taking differently: Take 30 mg by mouth every 12 (twelve) hours.  05/02/17  Yes [name], MD  Biotin w/ Vitamins C & E (HAIR/SKIN/NAILS PO) Take 1 tablet by mouth daily.   Yes [provider]  Chlorpheniramine Maleate (CHLOR-TABLETS PO) Take 1 tablet by mouth every evening.   Yes [provider]  diphenoxylate-atropine (LOMOTIL) 2.5-0.025 MG tablet TAKE 1 TABLET FOUR TIMES DAILY AS NEEDEDFOR DIARRHEA OR LOOSE STOOLS 06/22/17  Yes [name], MD  Encorafenib 75 MG CAPS Take 300 mg daily by mouth. Patient taking differently: Take 225 mg by mouth daily.  05/02/17  Yes [name], MD  lidocaine-prilocaine (EMLA) cream Apply 1 application topically as needed. Apply to port a cath site 1 hour prior to chemotherapy treatments 08/30/16  Yes [name], MD  loperamide (IMODIUM A-D) 2 MG tablet Take 2 mg by mouth 4 (four) times daily as needed for diarrhea or loose stools.    Yes [provider]  metoprolol tartrate (LOPRESSOR) 25 MG tablet Take 1 tablet (25 mg total) by mouth 2 (two) times daily. 06/06/17  Yes [name], MD  naproxen sodium (ANAPROX) 220 MG tablet Take 440 mg by mouth 2 (two) times daily as needed.   Yes [provider]  ondansetron (ZOFRAN) 8 MG tablet Take 1 tablet (8 mg total) by mouth every 8 (eight) hours as needed for nausea or vomiting. 01/26/17  Yes [name], MD  potassium chloride (K-DUR) 10 MEQ tablet TAKE ONE TABLET BY MOUTH TWICE DAILY 08/02/17  Yes [name], MD  prochlorperazine (COMPAZINE) 10 MG tablet TAKE ONE TABLET BY MOUTH EVERY 6 HOURS AS NEEDED FOR NAUSEA OR VOMITING 12/31/16  Yes [name], MD  triamcinolone ointment (KENALOG) 0.5 % Apply 1 application topically 2 (two) times daily. To rash  05/26/17  Yes Burns, Wandra Feinstein, NP      VITAL SIGNS:  Blood pressure (!) 148/76, pulse 67, temperature (!) 97.5 F (36.4 C), temperature source Oral, resp. rate 20, height 5\' 4"  (1.626 m), weight 91.1 kg (200 lb 14.4 oz), SpO2 100 %.  PHYSICAL EXAMINATION:  GENERAL:  67 y.o.-year-old patient lying in the bed with no acute distress.  EYES: Pupils equal, round, reactive to light and accommodation. No scleral icterus. Extraocular muscles intact.  HEENT: Head atraumatic, normocephalic. Oropharynx and nasopharynx clear.  NECK:  Supple, no jugular venous distention. No thyroid enlargement, no tenderness.  LUNGS: Normal breath sounds bilaterally, no wheezing, rales,rhonchi or crepitation. No use of accessory muscles of respiration.  CARDIOVASCULAR: S1, S2 normal. No murmurs, rubs, or gallops.  ABDOMEN: Soft, nontender, nondistended. Bowel sounds present. No organomegaly or mass.  EXTREMITIES: No pedal edema, cyanosis, or clubbing.  NEUROLOGIC: Cranial nerves II through XII are intact. Muscle strength 5/5 in all extremities. Sensation intact. Gait not checked.  PSYCHIATRIC: The  patient is alert and oriented x 3.  SKIN: No obvious rash, lesion, or ulcer.   LABORATORY PANEL:   CBC Recent Labs  Lab 09/06/17 1125  WBC 8.7  HGB 8.3*  HCT 25.2*  PLT 307   ------------------------------------------------------------------------------------------------------------------  Chemistries  Recent Labs  Lab 09/05/17 0920 09/06/17 1125  NA 140 136  K 4.1 4.7  CL 111 108  CO2 21* 21*  GLUCOSE 112* 86  BUN 22* 25*  CREATININE 1.72* 1.75*  CALCIUM 8.5* 8.4*  MG 1.5*  --   AST 28 26  ALT 16 17  ALKPHOS 101 91  BILITOT 0.5 0.6   ------------------------------------------------------------------------------------------------------------------  Cardiac Enzymes No results for input(s): TROPONINI in the last 168 hours. ------------------------------------------------------------------------------------------------------------------  RADIOLOGY:  No results found.  EKG:    IMPRESSION AND PLAN:   Kathleen James  is a 67 y.o. 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  Patient said she had 2-3 bowel movements with dark red blood per rectum.  It was mixed with stool.  1.  GI bleed/rectal bleed -Given patient's history of colon mass rule out recurrence, hemorrhoids -Rule out upper GI bleed given history of gastritis/GERD -Admit to medical floor -IV fluids -GI consultation with Dr. Vicente Males plans for EGD and colonoscopy for tomorrow noted.  2.  Hypertension continue home meds  3.  History of Adenoneuroendocrine tumor---  getting chemotherapy at the cancer center  4.  Chronic anemia -Hemoglobin of 8.8--- today 8.3   All the records are reviewed and case discussed with ED provider. Management plans discussed with the patient, family and they are in  agreement.  CODE STATUS: DNR  TOTAL TIME TAKING CARE OF THIS PATIENT: *45* minutes.    Fritzi Mandes M.D on 09/06/2017 at 2:56 PM  Between 7am to 6pm - Pager - [phone]  After 6pm go to [email or web address] - password [email or web address] [facility]  [facility]  Office  [phone]  CC: Primary care physician; [name], MD

## 2017-09-06 NOTE — Telephone Encounter (Signed)
Please have patient come in this AM to see Sonia Baller, NP & labs- cbc, pt/ptt.  I will enter the lab orders per md order.  Sonia Baller may also need to consult with Dr. Bary Castilla per Dr. Rogue Bussing.  Colette, please arrange apts.

## 2017-09-06 NOTE — Telephone Encounter (Signed)
Called patient to request that she come in for appointment with NP. Patient agreed.

## 2017-09-06 NOTE — Progress Notes (Signed)
Family Meeting Note  Advance Directive yes  Today a meeting took place with the patient in her room no family members present.    The following were discussed:Patient's diagnosis: , Patient's progosis: Patient being admitted with rectal bleed.  She has history of neuroendocrine colon mass with liver metastases currently getting chemotherapy.  She is hemodynamically stable.  CODE STATUS addressed.  Patient wishes to be a DNR.  Time spent during discussion: 16 mins Fritzi Mandes, MD

## 2017-09-06 NOTE — Telephone Encounter (Signed)
Sounds good

## 2017-09-06 NOTE — Consult Note (Signed)
Kathleen James , MD [address] [place], [place], [place], [address] [address], [place], [place], [place], [address] Phone: [phone]  Fax: [phone]  Consultation  Referring Provider:  Dr Posey Pronto  Primary Care Physician:  [name], MD Primary Gastroenterologist:  None          Reason for Consultation:     Anemia/GI bleed   Date of Admission:  09/06/2017 Date of Consultation:  09/06/2017         HPI:   Kathleen James is a 67 y.o. female with a history of Stage IV neuroendocrine CA , ileocolonic mass , had resection of the primary tumor in 12/2016 , S/p chemotherapy. She does have liver metastasis. She called in this morning that she had 3 bowel movements with moderate blood in it . Similar episode a few months back . Hb today 8.3 grams with MCV of 94. No elevation of BUN/Cr ratio recently.   Lastbowel movement was at around 9am with dark and bright red blood mixed. Took a few BC powders last week. Denies any abdominal pain. No hematemesis. No use of blood thinners.     CBC Latest Ref Rng & Units 09/06/2017 09/05/2017 08/15/2017  WBC 3.6 - 11.0 K/uL 8.7 5.7 7.6  Hemoglobin 12.0 - 16.0 g/dL 8.3(L) 8.8(L) 10.7(L)  Hematocrit 35.0 - 47.0 % 25.2(L) 25.8(L) 31.0(L)  Platelets 150 - 440 K/uL 307 317 336     Past Medical History:  Diagnosis Date  . Angiolipoma of kidney    followed with serial CTs ,,  Dr. Jacqlyn Larsen  . Anxiety   . Arthritis   . 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 Chemotherapy induced diarrhea   . Hammer toe   . Hypertension   . Liver cancer ([place])   . Neuro-endocrine carcinoma ([place])   . Obesity (BMI 30-39.9)     Past Surgical History:  Procedure Laterality Date  . ABDOMINAL HYSTERECTOMY  1995   endometriosis, heavy bleeding  . BUNIONECTOMY Bilateral   . CESAREAN SECTION  1985  . COLONOSCOPY WITH PROPOFOL N/A 10/09/2015   Procedure: COLONOSCOPY WITH PROPOFOL;  Surgeon: Robert Bellow, MD;   Location: [facility] ENDOSCOPY;  Service: Endoscopy;  Laterality: N/A;  . IR ANGIOGRAM SELECTIVE EACH ADDITIONAL VESSEL  11/22/2016  . IR ANGIOGRAM SELECTIVE EACH ADDITIONAL VESSEL  11/22/2016  . IR ANGIOGRAM SELECTIVE EACH ADDITIONAL VESSEL  11/22/2016  . IR ANGIOGRAM SELECTIVE EACH ADDITIONAL VESSEL  12/07/2016  . IR ANGIOGRAM SELECTIVE EACH ADDITIONAL VESSEL  12/07/2016  . IR ANGIOGRAM VISCERAL SELECTIVE  11/22/2016  . IR ANGIOGRAM VISCERAL SELECTIVE  12/07/2016  . IR EMBO ARTERIAL NOT HEMORR HEMANG INC GUIDE ROADMAPPING  11/22/2016  . IR EMBO TUMOR ORGAN ISCHEMIA INFARCT INC GUIDE ROADMAPPING  12/07/2016  . IR GENERIC HISTORICAL  04/07/2016   IR RADIOLOGIST EVAL & MGMT 04/07/2016 Arne Cleveland, MD [facility] INTERV RAD  . IR RADIOLOGIST EVAL & MGMT  04/14/2017  . IR US GUIDE VASC ACCESS RIGHT  11/22/2016  . IR US GUIDE VASC ACCESS RIGHT  12/07/2016  . LAPAROSCOPIC RIGHT COLECTOMY Right 10/26/2016   Procedure: LAPAROSCOPIC RIGHT COLECTOMY;  Surgeon: Robert Bellow, MD;  Location: [facility] ORS;  Service: General;  Laterality: Right;  . OOPHORECTOMY    . PORTACATH PLACEMENT Left 10/01/2015   Procedure: INSERTION PORT-A-CATH;  Surgeon: Robert Bellow, MD;  Location: [facility] ORS;  Service: General;  Laterality: Left;  . ROTATOR CUFF REPAIR Right 2008  right shoulder.  [place]     Prior to Admission medications   Medication Sig Start Date End Date Taking? Authorizing Provider  ALPRAZolam (XANAX) 0.25 MG tablet Take 1 tablet (0.25 mg total) by mouth 2 (two) times daily as needed for anxiety. 04/21/17   [name], MD  Binimetinib 15 MG TABS Take 45 mg every 12 (twelve) hours by mouth. Patient taking differently: Take 30 mg by mouth every 12 (twelve) hours.  05/02/17   [name], MD  Biotin w/ Vitamins C & E (HAIR/SKIN/NAILS PO) Take 1 tablet by mouth daily.    [provider]  Chlorpheniramine Maleate (CHLOR-TABLETS PO) Take 1 tablet by mouth every evening.    [provider]    clindamycin (CLINDAGEL) 1 % gel Apply topically 2 (two) times daily. Apply to affected areas twice a day. 03/28/17   [name], MD  diphenoxylate-atropine (LOMOTIL) 2.5-0.025 MG tablet TAKE 1 TABLET FOUR TIMES DAILY AS NEEDEDFOR DIARRHEA OR LOOSE STOOLS 06/22/17   [name], MD  Encorafenib 75 MG CAPS Take 300 mg daily by mouth. 05/02/17   [name], MD  lidocaine-prilocaine (EMLA) cream Apply 1 application topically as needed. Apply to port a cath site 1 hour prior to chemotherapy treatments 08/30/16   [name], MD  loperamide (IMODIUM A-D) 2 MG tablet Take 2 mg by mouth 4 (four) times daily as needed for diarrhea or loose stools.    [provider]  metoprolol tartrate (LOPRESSOR) 25 MG tablet Take 1 tablet (25 mg total) by mouth 2 (two) times daily. 06/06/17   [name], MD  minocycline (MINOCIN) 100 MG capsule Take 1 capsule (100 mg total) by mouth daily. 03/28/17   [name], MD  naproxen sodium (ANAPROX) 220 MG tablet Take 440 mg by mouth 2 (two) times daily as needed.    [provider]  ondansetron (ZOFRAN) 8 MG tablet Take 1 tablet (8 mg total) by mouth every 8 (eight) hours as needed for nausea or vomiting. 01/26/17   [name], MD  potassium chloride (K-DUR) 10 MEQ tablet TAKE ONE TABLET BY MOUTH TWICE DAILY 08/02/17   [name], MD  prochlorperazine (COMPAZINE) 10 MG tablet TAKE ONE TABLET BY MOUTH EVERY 6 HOURS AS NEEDED FOR NAUSEA OR VOMITING 12/31/16   [name], MD  triamcinolone ointment (KENALOG) 0.5 % Apply 1 application topically 2 (two) times daily. To rash 05/26/17   Jacquelin Hawking, NP    Family History  Problem Relation Age of Onset  . Mental illness Mother 36       bipolar, dementia  . Cancer Maternal Aunt 70       breast ca  . Hypertension Father   . Stroke Father 16       deceased  . COPD Father   . Heart disease Sister        deceased      Social History   Tobacco Use  . Smoking status: Never Smoker  . Smokeless tobacco: Never Used  Substance Use Topics  . Alcohol use: Yes    Alcohol/week: 0.0 oz    Comment: occasionally  . Drug use: No    Allergies as of 09/06/2017 - Review Complete 09/05/2017  Allergen Reaction Noted  . Bee venom Hives 08/05/2015  . Dilaudid [hydromorphone hcl] Nausea And Vomiting 11/22/2016  . Morphine and related Nausea Only 05/19/2011    Review of Systems:    All systems reviewed  and negative except where noted in HPI.   Physical Exam:  Vital signs in last 24 hours:     General:   Pleasant, cooperative in NAD Head:  Normocephalic and atraumatic. Eyes:   No icterus.   Conjunctiva pink. PERRLA. Ears:  Normal auditory acuity. Neck:  Supple; no masses or thyroidomegaly Lungs: Respirations even and unlabored. Lungs clear to auscultation bilaterally.   No wheezes, crackles, or rhonchi.  Heart:  Regular rate and rhythm;  Without murmur, clicks, rubs or gallops Abdomen:  Soft, nondistended, nontender. Normal bowel sounds. No appreciable masses or hepatomegaly.  No rebound or guarding.  Neurologic:  Alert and oriented x3;  grossly normal neurologically. Skin:  Intact without significant lesions or rashes. Cervical Nodes:  No significant cervical adenopathy. Psych:  Alert and cooperative. Normal affect.  LAB RESULTS: Recent Labs    09/05/17 0920 09/06/17 1125  WBC 5.7 8.7  HGB 8.8* 8.3*  HCT 25.8* 25.2*  PLT 317 307   BMET Recent Labs    09/05/17 0920 09/06/17 1125  NA 140 136  K 4.1 4.7  CL 111 108  CO2 21* 21*  GLUCOSE 112* 86  BUN 22* 25*  CREATININE 1.72* 1.75*  CALCIUM 8.5* 8.4*   LFT Recent Labs    09/06/17 1125  PROT 6.3*  ALBUMIN 2.9*  AST 26  ALT 17  ALKPHOS 91  BILITOT 0.6   PT/INR Recent Labs    09/06/17 1125  LABPROT 13.4  INR 1.03    STUDIES: No results found.    Impression / Plan:   Kathleen James is a 67 y.o. y/o female with  metastatic neuroendocrine tumor with primary tumor in the ascending colon resected surgically. Was doing well. Acute onset of bloody bowel movements last few days. No repeat colonoscopy after surgery .    Plan  1. EGD+ colonoscopy tomorrow - if negative will need capsule study of the small bowel   2. Monitor CBC and transfuse if needed , if has overt rectal bleeding overnight consider tagged RBC scan to localize the source.   I have discussed alternative options, risks & benefits,  which include, but are not limited to, bleeding, infection, perforation,respiratory complication & drug reaction.  The patient agrees with this plan & written consent will be obtained.    Thank you for involving me in the care of this patient.      LOS: 0 days   Kathleen Bellows, MD  09/06/2017, 2:19 PM

## 2017-09-06 NOTE — Progress Notes (Signed)
Symptom Management Consult note [facility]  [phone] [phone]  [name], [name], MD (Internal Medicine) Bary Castilla, Forest Gleason, [name], RN as Registered Nurse [name], MD as Consulting Physician (Internal Medicine)   Name of the patient: Kathleen James  [id number]  09-11-1950   Date of visit: 09/06/17  Diagnosis- Malignant Neoplasm of the Ascending Colon with metastases to liver  Chief complaint/ Reason for visit- Blood in stool   Heme/Onc history: Patient was last seen by her primary oncologist Dr. Rogue Bussing yesterday for follow-up prior to next cycle . Patient's last chemotherapy was approximately 3 weeks ago on 08/15/2017 where she received Vectibix/Enco/Bini. It was delayed by 1 week due to a planned vacation to the beach. She continued to have a mild rash without itching. Minocycline was discontinued due to renal insufficiency. Patient denied any significant diarrhea. Complained of mild fatigue. Awaiting an appointment at [facility] to discuss further chemotherapy options including clinical trials if she progresses on this current therapy.  Most recent PET scan from 07/14/2017 showed improved metastatic lesions to the liver and improved retroperitoneal lymph nodes 6 mm in size. CEA elevated from yesterdays visit. CEA 92.9. Previously 57.3.   Currently on dose reduced Vectibex (4 mg/kg), Enco 3 pills/ Bini 2 pills BID.   Dose reduced Vectibix due to severe rash. Currently using clindamycin gel with improvement. Received 2 g of magnesium yesterday for a mag level of 1.5.   Interval history-  Kathleen James presents for presents evaluation of blood in stool/ rectal bleeding.  She has associated symptoms of loose stools and visible blood: maroon blood mixed in with stool .  The patient denies abdominal pain and  alternating loose stools and constipation.   The patient has a known history of: Colon Cancer .  The patient has had 1 episodes of rectal bleeding.   There is not a history of rectal injury.  Patient does not have similar episodes of rectal bleeding in the past.  ECOG FS:1 - Symptomatic but completely ambulatory  Review of systems- Review of Systems  Constitutional: Negative.  Negative for chills, fever, malaise/fatigue and weight loss.  HENT: Negative for congestion and ear pain.   Eyes: Negative.  Negative for blurred vision and double vision.  Respiratory: Negative.  Negative for cough, sputum production and shortness of breath.   Cardiovascular: Negative.  Negative for chest pain, palpitations and leg swelling.  Gastrointestinal: Positive for blood in stool (x 3 episodes this morning) and diarrhea. Negative for abdominal pain, constipation, nausea and vomiting.  Genitourinary: Negative for dysuria, frequency and urgency.  Musculoskeletal: Negative for back pain and falls.  Skin: Negative.  Negative for rash.  Neurological: Negative.  Negative for weakness and headaches.  Endo/Heme/Allergies: Negative.  Does not bruise/bleed easily.  Psychiatric/Behavioral: Negative.  Negative for depression. The patient is not nervous/anxious and does not have insomnia.      Current treatment- Vectibix/Enco/Bini  Allergies  Allergen Reactions  . Bee Venom Hives    Has epi-pen  . Dilaudid [Hydromorphone Hcl] Nausea And Vomiting  . Morphine And Related Nausea Only     Past Medical History:  Diagnosis Date  . Angiolipoma of kidney    followed with serial CTs ,,  Dr. Jacqlyn Larsen  . Anxiety   . Arthritis   . 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  Chemotherapy induced diarrhea   . Hammer toe   . Hypertension   . Liver cancer ([place])   . Neuro-endocrine carcinoma ([place])   . Obesity (BMI 30-39.9)      Past Surgical History:  Procedure  Laterality Date  . ABDOMINAL HYSTERECTOMY  1995   endometriosis, heavy bleeding  . BUNIONECTOMY Bilateral   . CESAREAN SECTION  1985  . COLONOSCOPY WITH PROPOFOL N/A 10/09/2015   Procedure: COLONOSCOPY WITH PROPOFOL;  Surgeon: Robert Bellow, MD;  Location: [facility] ENDOSCOPY;  Service: Endoscopy;  Laterality: N/A;  . IR ANGIOGRAM SELECTIVE EACH ADDITIONAL VESSEL  11/22/2016  . IR ANGIOGRAM SELECTIVE EACH ADDITIONAL VESSEL  11/22/2016  . IR ANGIOGRAM SELECTIVE EACH ADDITIONAL VESSEL  11/22/2016  . IR ANGIOGRAM SELECTIVE EACH ADDITIONAL VESSEL  12/07/2016  . IR ANGIOGRAM SELECTIVE EACH ADDITIONAL VESSEL  12/07/2016  . IR ANGIOGRAM VISCERAL SELECTIVE  11/22/2016  . IR ANGIOGRAM VISCERAL SELECTIVE  12/07/2016  . IR EMBO ARTERIAL NOT HEMORR HEMANG INC GUIDE ROADMAPPING  11/22/2016  . IR EMBO TUMOR ORGAN ISCHEMIA INFARCT INC GUIDE ROADMAPPING  12/07/2016  . IR GENERIC HISTORICAL  04/07/2016   IR RADIOLOGIST EVAL & MGMT 04/07/2016 Arne Cleveland, MD [facility] INTERV RAD  . IR RADIOLOGIST EVAL & MGMT  04/14/2017  . IR US GUIDE VASC ACCESS RIGHT  11/22/2016  . IR US GUIDE VASC ACCESS RIGHT  12/07/2016  . LAPAROSCOPIC RIGHT COLECTOMY Right 10/26/2016   Procedure: LAPAROSCOPIC RIGHT COLECTOMY;  Surgeon: Robert Bellow, MD;  Location: [facility] ORS;  Service: General;  Laterality: Right;  . OOPHORECTOMY    . PORTACATH PLACEMENT Left 10/01/2015   Procedure: INSERTION PORT-A-CATH;  Surgeon: Robert Bellow, MD;  Location: [facility] ORS;  Service: General;  Laterality: Left;  . ROTATOR CUFF REPAIR Right 2008   right shoulder.  [place]     Social History   Socioeconomic History  . Marital status: Married    Spouse name: Not on file  . Number of children: Not on file  . Years of education: Not on file  . Highest education level: Not on file  Social Needs  . Financial resource strain: Not on file  . Food insecurity - worry: Not on file  . Food insecurity - inability: Not on file  . Transportation needs - medical: Not on  file  . Transportation needs - non-medical: Not on file  Occupational History  . Not on file  Tobacco Use  . Smoking status: Never Smoker  . Smokeless tobacco: Never Used  Substance and Sexual Activity  . Alcohol use: Yes    Alcohol/week: 0.0 oz    Comment: occasionally  . Drug use: No  . Sexual activity: Not Currently  Other Topics Concern  . Not on file  Social History Narrative  . Not on file    Family History  Problem Relation Age of Onset  . Mental illness Mother 32       bipolar, dementia  . Cancer Maternal Aunt 70       breast ca  . Hypertension Father   . Stroke Father 26       deceased  . COPD Father   . Heart disease Sister        deceased    No current facility-administered medications for this visit.  No current outpatient medications on file.  Facility-Administered Medications Ordered in Other Visits:  .  0.9 %  sodium chloride infusion, , Intravenous, Once, Fritzi Mandes, MD .  0.9 %  sodium chloride infusion, ,  Intravenous, Continuous, Jonathon Bellows, MD .  acetaminophen (TYLENOL) tablet 650 mg, 650 mg, Oral, Q6H PRN **OR** acetaminophen (TYLENOL) suppository 650 mg, 650 mg, Rectal, Q6H PRN, Fritzi Mandes, MD .  heparin lock flush 100 unit/mL, 500 Units, Intravenous, Once, Charlaine Dalton R, MD .  metoprolol tartrate (LOPRESSOR) tablet 25 mg, 25 mg, Oral, BID, Fritzi Mandes, MD .  ondansetron (ZOFRAN) tablet 4 mg, 4 mg, Oral, Q6H PRN **OR** ondansetron (ZOFRAN) injection 4 mg, 4 mg, Intravenous, Q6H PRN, Fritzi Mandes, MD .  polyethylene glycol-electrolytes (NuLYTELY/GoLYTELY) solution 4,000 mL, 4,000 mL, Oral, Once, Jonathon Bellows, MD .  sodium chloride flush (NS) 0.9 % injection 10 mL, 10 mL, Intravenous, PRN, [name], MD, 10 mL at 11/24/15 1829  Physical exam: There were no vitals filed for this visit. Physical Exam  Constitutional: She is oriented to person, place, and time and well-developed, well-nourished, and in no distress. Vital signs are  normal.  HENT:  Head: Normocephalic and atraumatic.  Eyes: Pupils are equal, round, and reactive to light.  Neck: Normal range of motion.  Cardiovascular: Normal rate, regular rhythm and normal heart sounds.  No murmur heard. Pulmonary/Chest: Effort normal and breath sounds normal. She has no wheezes.  Abdominal: Soft. Normal appearance and bowel sounds are normal. She exhibits no distension. There is no tenderness.  Musculoskeletal: Normal range of motion. She exhibits no edema.  Neurological: She is alert and oriented to person, place, and time. Gait normal.  Skin: Skin is warm and dry. No rash noted.  Psychiatric: Mood, memory, affect and judgment normal.     CMP Latest Ref Rng & Units 09/06/2017  Glucose 65 - 99 mg/dL 86  BUN 6 - 20 mg/dL 25(H)  Creatinine 0.44 - 1.00 mg/dL 1.75(H)  Sodium 135 - 145 mmol/L 136  Potassium 3.5 - 5.1 mmol/L 4.7  Chloride 101 - 111 mmol/L 108  CO2 22 - 32 mmol/L 21(L)  Calcium 8.9 - 10.3 mg/dL 8.4(L)  Total Protein 6.5 - 8.1 g/dL 6.3(L)  Total Bilirubin 0.3 - 1.2 mg/dL 0.6  Alkaline Phos 38 - 126 U/L 91  AST 15 - 41 U/L 26  ALT 14 - 54 U/L 17   CBC Latest Ref Rng & Units 09/06/2017  WBC 3.6 - 11.0 K/uL 8.7  Hemoglobin 12.0 - 16.0 g/dL 8.3(L)  Hematocrit 35.0 - 47.0 % 25.2(L)  Platelets 150 - 440 K/uL 307    No images are attached to the encounter.  No results found.   Assessment and plan- Patient is a 67 y.o. female who presented for 3 bloody bowel movements this morning.   1. Neuroendocrine cancer stage IV of the Colon with Mets to liver: Patient currently receiving Vec+Enco+Bini. Last cycle was yesterday. Plan was for her to return for labs and MD assessment prior to next cycle.   2. Blood in stool: 3 episodes this morning. Described as burgundy/red in color. Hemoglobin prior to chemotherapy yesterday was 8.8 which is below baseline. Today's hemoglobin was 8.3. No recent imaging, other then PET scan in January. S/p resection of primary  tumor with Dr. Bary Castilla June 2018.   Spoke to Dr. Rogue Bussing, patient's primary oncologist and he recommended I consult gastroenterology for their recommendations. Spoke to Dr. Vicente Males with GI and he recommended an upper endoscopy and colonoscopy ASAP. If patient is stable, he recommended outpatient as soon as tomorrow morning. Although patient appears stable, vital signs within normal limits, patient not symptomatic and in no pain, I would rather her be monitored inpatient.  Labs are trending down and she appears to be actively bleeding. Dr. Rogue Bussing was in agreement with this plan. Patient was directly admitted to medical oncology 1C room 124. Dr. Posey Pronto was receiving hospitalist.   Visit Diagnosis 1. Blood in stool   2. Malignant neoplasm of ascending colon ([place])   3. 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 She also understands that She can call clinic at any time with any questions, concerns, or complaints.   Greater than 50% was spent in counseling and coordination of care with this patient including but not limited to discussion of the relevant topics above (See A&P) including, but not limited to diagnosis and management of acute and chronic medical conditions.    Faythe Casa, AGNP-C [facility]- [phone] Pager- [phone] 09/06/2017 3:37 PM

## 2017-09-06 NOTE — Telephone Encounter (Signed)
Patient called to report that she has had 3 bowl movements this morning and each time there was a moderate amount of blood. She is not in pain.  She denies hemorrhoids and said that she had had a similar episode about  three months ago. Please advise.

## 2017-09-06 NOTE — Plan of Care (Signed)
  Education: Ability to identify signs and symptoms of gastrointestinal bleeding will improve 09/06/2017 1949 - Progressing by Jeri Cos, RN

## 2017-09-07 ENCOUNTER — Inpatient Hospital Stay: Payer: MEDICARE | Admitting: Anesthesiology

## 2017-09-07 ENCOUNTER — Encounter: Payer: Self-pay | Admitting: Anesthesiology

## 2017-09-07 ENCOUNTER — Encounter
Admission: AD | Disposition: A | Discharge status: Home / Self Care | Payer: Self-pay | Source: Ambulatory Visit | Attending: Internal Medicine

## 2017-09-07 ENCOUNTER — Encounter: Payer: Self-pay | Admitting: Gastroenterology

## 2017-09-07 DIAGNOSIS — Z6834 Body mass index (BMI) 34.0-34.9, adult: Secondary | ICD-10-CM | POA: Diagnosis not present

## 2017-09-07 DIAGNOSIS — I10 Essential (primary) hypertension: Secondary | ICD-10-CM | POA: Diagnosis not present

## 2017-09-07 DIAGNOSIS — C189 Malignant neoplasm of colon, unspecified: Secondary | ICD-10-CM | POA: Diagnosis not present

## 2017-09-07 DIAGNOSIS — K5791 Diverticulosis of intestine, part unspecified, without perforation or abscess with bleeding: Secondary | ICD-10-CM | POA: Diagnosis not present

## 2017-09-07 DIAGNOSIS — K633 Ulcer of intestine: Secondary | ICD-10-CM

## 2017-09-07 DIAGNOSIS — J309 Allergic rhinitis, unspecified: Secondary | ICD-10-CM | POA: Diagnosis not present

## 2017-09-07 DIAGNOSIS — D62 Acute posthemorrhagic anemia: Secondary | ICD-10-CM | POA: Diagnosis not present

## 2017-09-07 DIAGNOSIS — K922 Gastrointestinal hemorrhage, unspecified: Secondary | ICD-10-CM | POA: Diagnosis not present

## 2017-09-07 DIAGNOSIS — Z8719 Personal history of other diseases of the digestive system: Secondary | ICD-10-CM | POA: Diagnosis not present

## 2017-09-07 DIAGNOSIS — K5289 Other specified noninfective gastroenteritis and colitis: Secondary | ICD-10-CM | POA: Diagnosis not present

## 2017-09-07 DIAGNOSIS — K625 Hemorrhage of anus and rectum: Secondary | ICD-10-CM | POA: Diagnosis not present

## 2017-09-07 DIAGNOSIS — E669 Obesity, unspecified: Secondary | ICD-10-CM | POA: Diagnosis not present

## 2017-09-07 HISTORY — PX: COLONOSCOPY WITH PROPOFOL: SHX5780

## 2017-09-07 HISTORY — PX: ESOPHAGOGASTRODUODENOSCOPY (EGD) WITH PROPOFOL: SHX5813

## 2017-09-07 LAB — CBC
HEMATOCRIT: 23.5 % — AB (ref 35.0–47.0)
HEMOGLOBIN: 8 g/dL — AB (ref 12.0–16.0)
MCH: 31.3 pg (ref 26.0–34.0)
MCHC: 33.9 g/dL (ref 32.0–36.0)
MCV: 92.6 fL (ref 80.0–100.0)
Platelets: 340 10*3/uL (ref 150–440)
RBC: 2.54 MIL/uL — AB (ref 3.80–5.20)
RDW: 14.8 % — AB (ref 11.5–14.5)
WBC: 7.6 10*3/uL (ref 3.6–11.0)

## 2017-09-07 SURGERY — ESOPHAGOGASTRODUODENOSCOPY (EGD) WITH PROPOFOL
Anesthesia: General

## 2017-09-07 MED ORDER — HEPARIN SOD (PORK) LOCK FLUSH 100 UNIT/ML IV SOLN
500.0000 [IU] | Freq: Once | INTRAVENOUS | Status: AC
  Administered 2017-09-07: 17:00:00 500 [IU] via INTRAVENOUS
  Filled 2017-09-07: qty 5

## 2017-09-07 MED ORDER — FENTANYL CITRATE (PF) 100 MCG/2ML IJ SOLN
INTRAMUSCULAR | Status: DC | PRN
  Administered 2017-09-07 (×2): 50 ug via INTRAVENOUS

## 2017-09-07 MED ORDER — PROPOFOL 10 MG/ML IV BOLUS
INTRAVENOUS | Status: DC | PRN
  Administered 2017-09-07: 100 mg via INTRAVENOUS

## 2017-09-07 MED ORDER — PANTOPRAZOLE SODIUM 40 MG PO TBEC: 40.0000 mg | DELAYED_RELEASE_TABLET | Freq: Every day | ORAL | 1 refills | Status: DC

## 2017-09-07 MED ORDER — LIDOCAINE HCL (CARDIAC) 20 MG/ML IV SOLN
INTRAVENOUS | Status: DC | PRN
  Administered 2017-09-07: 30 mg via INTRAVENOUS

## 2017-09-07 MED ORDER — FENTANYL CITRATE (PF) 100 MCG/2ML IJ SOLN
INTRAMUSCULAR | Status: AC
  Filled 2017-09-07: qty 2

## 2017-09-07 MED ORDER — PROPOFOL 500 MG/50ML IV EMUL
INTRAVENOUS | Status: DC | PRN
  Administered 2017-09-07: 140 ug/kg/min via INTRAVENOUS

## 2017-09-07 NOTE — Discharge Summary (Signed)
[place] [facility] NAME: Kathleen James    MR#:  [id number]  DATE OF BIRTH:  Sep 24, 1950  DATE OF ADMISSION:  09/06/2017 ADMITTING PHYSICIAN: Dustin Flock, MD  DATE OF DISCHARGE: 09/07/2017   PRIMARY CARE PHYSICIAN: [name], MD    ADMISSION DIAGNOSIS:  GI Bleed  DISCHARGE DIAGNOSIS:  Active Problems:   Rectal bleed   Mucosal ulcers  SECONDARY DIAGNOSIS:   Past Medical History:  Diagnosis Date  . Angiolipoma of kidney    followed with serial CTs ,,  Dr. Jacqlyn Larsen  . Anxiety   . Arthritis   . 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 Chemotherapy induced diarrhea   . Hammer toe   . Hypertension   . Liver cancer ([place])   . Neuro-endocrine carcinoma ([place])   . Obesity (BMI 30-39.9)     HOSPITAL COURSE:   Kathleen James  is a 67 y.o. 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  Patient said she had 2-3 bowel movements with dark red blood per rectum.  It was mixed with stool.  1.  GI bleed/rectal bleed -Ruled out upper GI bleed given history of gastritis/GERD as EGD was negative. -IV fluids -GI consultation with Dr. Vicente Males plans for EGD and colonoscopy . - Colonoscopy showed mucosal ulcers in her anastomosis site from previous surgery in colon. - Advised not to take NSAIDs at home and take PPI - discharge as Hb is stable for last 24- 36 hrs. 2.  Hypertension continue home meds  3.  History of Adenoneuroendocrine tumor---  getting chemotherapy at the cancer center  4.  Chronic anemia due to cancer. -Hemoglobin of 8.8--- today 8.3    DISCHARGE CONDITIONS:   stable  CONSULTS OBTAINED:  Treatment Team:  Jonathon Bellows, MD  DRUG ALLERGIES:    Allergies  Allergen Reactions  . Bee Venom Hives    Has epi-pen  . Dilaudid [Hydromorphone Hcl] Nausea And Vomiting  . Morphine And Related Nausea Only    DISCHARGE MEDICATIONS:   Allergies as of 09/07/2017      Reactions   Bee Venom Hives   Has epi-pen   Dilaudid [hydromorphone Hcl] Nausea And Vomiting   Morphine And Related Nausea Only      Medication List    STOP taking these medications   Encorafenib 75 MG Caps     TAKE these medications   ALPRAZolam 0.25 MG tablet Commonly known as:  XANAX Take 1 tablet (0.25 mg total) by mouth 2 (two) times daily as needed for anxiety.   Binimetinib 15 MG Tabs Take 45 mg every 12 (twelve) hours by mouth. What changed:  how much to take   CHLOR-TABLETS PO Take 1 tablet by mouth every evening.   diphenoxylate-atropine 2.5-0.025 MG tablet Commonly known as:  LOMOTIL TAKE 1 TABLET FOUR TIMES DAILY AS NEEDEDFOR DIARRHEA OR LOOSE STOOLS   HAIR/SKIN/NAILS PO Take 1 tablet by mouth daily.   IMODIUM A-D 2 MG tablet Generic drug:  loperamide Take 2 mg by mouth 4 (four) times daily as needed for diarrhea or loose stools.   lidocaine-prilocaine cream Commonly known as:  EMLA Apply 1 application topically as needed. Apply to port a cath site 1 hour prior to chemotherapy treatments  metoprolol tartrate 25 MG tablet Commonly known as:  LOPRESSOR Take 1 tablet (25 mg total) by mouth 2 (two) times daily.   naproxen sodium 220 MG tablet Commonly known as:  ALEVE Take 440 mg by mouth 2 (two) times daily as needed.   ondansetron 8 MG tablet Commonly known as:  ZOFRAN Take 1 tablet (8 mg total) by mouth every 8 (eight) hours as needed for nausea or vomiting.   pantoprazole 40 MG tablet Commonly known as:  PROTONIX Take 1 tablet (40 mg total) by mouth daily.   potassium chloride 10 MEQ tablet Commonly known as:  K-DUR TAKE ONE TABLET BY MOUTH TWICE DAILY   prochlorperazine 10 MG tablet Commonly known as:  COMPAZINE TAKE  ONE TABLET BY MOUTH EVERY 6 HOURS AS NEEDED FOR NAUSEA OR VOMITING   triamcinolone ointment 0.5 % Commonly known as:  KENALOG Apply 1 application topically 2 (two) times daily. To rash        DISCHARGE INSTRUCTIONS:    Follow with cancer center in 5 days.  If you experience worsening of your admission symptoms, develop shortness of breath, life threatening emergency, suicidal or homicidal thoughts you must seek medical attention immediately by calling 911 or calling your MD immediately  if symptoms less severe.  You Must read complete instructions/literature along with all the possible adverse reactions/side effects for all the Medicines you take and that have been prescribed to you. Take any new Medicines after you have completely understood and accept all the possible adverse reactions/side effects.   Please note  You were cared for by a hospitalist during your hospital stay. If you have any questions about your discharge medications or the care you received while you were in the hospital after you are discharged, you can call the unit and asked to speak with the hospitalist on call if the hospitalist that took care of you is not available. Once you are discharged, your primary care physician will handle any further medical issues. Please note that NO REFILLS for any discharge medications will be authorized once you are discharged, as it is imperative that you return to your primary care physician (or establish a relationship with a primary care physician if you do not have one) for your aftercare needs so that they can reassess your need for medications and monitor your lab values.    Today   CHIEF COMPLAINT:  No chief complaint on file.   HISTORY OF PRESENT ILLNESS:  Kathleen James  is a 67 y.o. 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  Patient said she had 2-3 bowel movements with dark red blood per rectum.  It was mixed with stool.  She has on and off history of diarrhea.  Her hemoglobin has dropped down a few points and is being admitted for further vaginal management. Denies any vomiting or hematemesis.  Denies any abdominal pain shortness of breath or chest pain   VITAL SIGNS:  Blood pressure (!) 152/93, pulse 87, temperature (!) 97 F (36.1 C), resp. rate (!) 21, height 5\' 4"  (1.626 m), weight 91.1 kg (200 lb 14.4 oz), SpO2 100 %.  I/O:    Intake/Output Summary (Last 24 hours) at 09/07/2017 1647 Last data filed at 09/07/2017 1415 Gross per 24 hour  Intake 680 ml  Output 0 ml  Net  680 ml    PHYSICAL EXAMINATION:  GENERAL:  67 y.o.-year-old patient lying in the bed with no acute distress.  EYES: Pupils equal, round, reactive to light and accommodation. No scleral icterus. Extraocular muscles intact.  HEENT: Head atraumatic, normocephalic. Oropharynx and nasopharynx clear.  NECK:  Supple, no jugular venous distention. No thyroid enlargement, no tenderness.  LUNGS: Normal breath sounds bilaterally, no wheezing, rales,rhonchi or crepitation. No use of accessory muscles of respiration.  CARDIOVASCULAR: S1, S2 normal. No murmurs, rubs, or gallops.  ABDOMEN: Soft, non-tender, non-distended. Bowel sounds present. No organomegaly or mass.  EXTREMITIES: No pedal edema, cyanosis, or clubbing.  NEUROLOGIC: Cranial nerves II through XII are intact. Muscle strength 5/5 in all extremities. Sensation intact. Gait not checked.  PSYCHIATRIC: The patient is alert and oriented x 3.  SKIN: No obvious rash, lesion, or ulcer.   DATA REVIEW:   CBC Recent Labs  Lab 09/07/17 0604  WBC 7.6  HGB 8.0*  HCT 23.5*  PLT 340    Chemistries  Recent Labs  Lab 09/05/17 0920 09/06/17 1125  NA 140 136  K 4.1 4.7  CL 111 108  CO2 21* 21*  GLUCOSE 112* 86  BUN 22*  25*  CREATININE 1.72* 1.75*  CALCIUM 8.5* 8.4*  MG 1.5*  --   AST 28 26  ALT 16 17  ALKPHOS 101 91  BILITOT 0.5 0.6    Cardiac Enzymes No results for input(s): TROPONINI in the last 168 hours.  Microbiology Results  Results for orders placed or performed in visit on 12/29/16  Urine culture     Status: Abnormal   Collection Time: 12/29/16 10:00 AM  Result Value Ref Range Status   Specimen Description URINE, CLEAN CATCH  Final   Special Requests NONE  Final   Culture MULTIPLE ORGANISMS PRESENT, NONE PREDOMINANT (A)  Final   Report Status 12/30/2016 FINAL  Final    RADIOLOGY:  No results found.  EKG:   Orders placed or performed in visit on 04/09/17  . EKG      Management plans discussed with the patient, family and they are in agreement.  CODE STATUS:     Code Status Orders  (From admission, onward)        Start     Ordered   09/06/17 1537  Do not attempt resuscitation (DNR)  Continuous    Question Answer Comment  In the event of cardiac or respiratory ARREST Do not call a "code blue"   In the event of cardiac or respiratory ARREST Do not perform Intubation, CPR, defibrillation or ACLS   In the event of cardiac or respiratory ARREST Use medication by any route, position, wound care, and other measures to relive pain and suffering. May use oxygen, suction and manual treatment of airway obstruction as needed for comfort.      09/06/17 1536    Code Status History    Date Active Date Inactive Code Status Order ID Comments User Context   10/26/2016 12:26 10/29/2016 14:55 Full Code [id number]  Robert Bellow, MD Inpatient    Advance Directive Documentation     Most Recent Value  Type of Advance Directive  Healthcare Power of Attorney  Pre-existing out of facility DNR order (yellow form or pink MOST form)  No data  "MOST" Form in Place?  No data      TOTAL TIME TAKING CARE OF THIS PATIENT: 35 minutes.    Vaughan Basta M.D on 09/07/2017 at 4:47  PM  Between 7am  to 6pm - Pager - [phone]  After 6pm go to [email or web address] - password [email or web address] [place] [facility]  Office  [phone]  CC: Primary care physician; [name], MD   Note: This dictation was prepared with Dragon dictation along with smaller phrase technology. Any transcriptional errors that result from this process are unintentional.

## 2017-09-07 NOTE — Progress Notes (Signed)
Patient had been to the hospital for GI bleed; hemoglobin trended from 8.3-8; awaiting EGD colonoscopy today.  Okay to hold off oral chemo pills [Enco+ bini] given acute bleeding/GI issues.  We will plan to start back on  March 25/Monday if labs are adequate.  Patient is aware that CEA is trending up.

## 2017-09-07 NOTE — Discharge Instructions (Signed)
Don't take Over the counter pain meds.

## 2017-09-07 NOTE — Op Note (Signed)
[facility] Gastroenterology Patient Name: Kathleen James Procedure Date: 09/07/2017 1:45 PM MRN: [id number] Account #: [id number] Date of Birth: [birth year] Admit Type: Inpatient Age: 67 Room: [facility] ENDO ROOM 1 Gender: Female Note Status: Finalized Procedure:            Colonoscopy Indications:          Hematochezia Providers:            Jonathon Bellows MD, MD Medicines:            Monitored Anesthesia Care Complications:        No immediate complications. Procedure:            Pre-Anesthesia Assessment:                       - Prior to the procedure, a History and Physical was                        performed, and patient medications, allergies and                        sensitivities were reviewed. The patient's tolerance of                        previous anesthesia was reviewed.                       - The risks and benefits of the procedure and the                        sedation options and risks were discussed with the                        patient. All questions were answered and informed                        consent was obtained.                       - ASA Grade Assessment: III - A patient with severe                        systemic disease.                       After obtaining informed consent, the colonoscope was                        passed under direct vision. Throughout the procedure,                        the patient's blood pressure, pulse, and oxygen                        saturations were monitored continuously. The                        Colonoscope was introduced through the anus and                        advanced to the the ileocolonic anastomosis. The  colonoscopy was performed with ease. The patient                        tolerated the procedure well. The quality of the bowel                        preparation was good. Findings:      A continuous area of nonbleeding ulcerated mucosa with no stigmata of   recent bleeding was present at the anastomosis. Biopsies were taken with       a cold forceps for histology. The ulceration was seen all along the       suture line - no active bleeding . No blood seen anywhere in the colon      The exam was otherwise without abnormality.      Multiple small-mouthed diverticula were found in the sigmoid colon. Impression:           - Mucosal ulceration. Biopsied.                       - The examination was otherwise normal.                       - Diverticulosis in the sigmoid colon. Recommendation:       - Return patient to hospital ward for ongoing care.                       - Advance diet as tolerated.                       - Continue present medications.                       - Await pathology results.                       - Avoid all NSAID's. Differentials for ulceration                        include NSAID related, ischemia , recurrence of                        neoplasm and Crohns disease (in her case very                        unlikely). I will also have the lab stain for HSV , CMV                        as she is immunosupressed.                       I was not able to intubate the ileum due to tight angle                        and ulceration - the contents emptying from the small                        bowel had no blood making a small bowel bleed less                        likely.  Since no active bleeding, if hb stable can go home.                        Close follow up of her Hb with Dr Rogue Bussing . Procedure Code(s):    --- Professional ---                       [phone], Colonoscopy, flexible; with biopsy, single or                        multiple Diagnosis Code(s):    --- Professional ---                       K63.3, Ulcer of intestine                       K92.1, Melena (includes Hematochezia)                       K57.30, Diverticulosis of large intestine without                        perforation or abscess  without bleeding CPT copyright 2016 American Medical Association. All rights reserved. The codes documented in this report are preliminary and upon coder review may  be revised to meet current compliance requirements. Jonathon Bellows, MD Jonathon Bellows MD, MD 09/07/2017 2:29:41 PM This report has been signed electronically. Number of Addenda: 0 Note Initiated On: 09/07/2017 1:45 PM Scope Withdrawal Time: 0 hours 6 minutes 18 seconds  Total Procedure Duration: 0 hours 10 minutes 12 seconds       [facility]

## 2017-09-07 NOTE — Progress Notes (Signed)
Pt is now npo. Pt did not finish all of the [place].

## 2017-09-07 NOTE — Transfer of Care (Signed)
Immediate Anesthesia Transfer of Care Note  Patient: Kathleen James  Procedure(s) Performed: ESOPHAGOGASTRODUODENOSCOPY (EGD) WITH PROPOFOL (N/A ) COLONOSCOPY WITH PROPOFOL (N/A )  Patient Location: PACU and Endoscopy Unit  Anesthesia Type:General  Level of Consciousness: sedated  Airway & Oxygen Therapy: Patient Spontanous Breathing and Patient connected to nasal cannula oxygen  Post-op Assessment: Report given to RN and Post -op Vital signs reviewed and stable  Post vital signs: Reviewed and stable  Last Vitals:  Vitals:   09/07/17 1345 09/07/17 1427  BP: (!) 154/89   Pulse: 84   Resp: 16   Temp: 36.6 C (!) 36.1 C  SpO2: 100%     Last Pain:  Vitals:   09/07/17 1427  TempSrc: Tympanic  PainSc: Asleep         Complications: No apparent anesthesia complications

## 2017-09-07 NOTE — Anesthesia Post-op Follow-up Note (Signed)
Anesthesia QCDR form completed.        

## 2017-09-07 NOTE — Op Note (Signed)
[facility] Gastroenterology Patient Name: Kathleen James Procedure Date: 09/07/2017 1:46 PM MRN: [id number] Account #: [id number] Date of Birth: February 08, 1951 Admit Type: Inpatient Age: 67 Room: [facility] ENDO ROOM 1 Gender: Female Note Status: Finalized Procedure:            Upper GI endoscopy Indications:          Hematochezia Providers:            Jonathon Bellows MD, MD Referring MD:         Deborra Medina, MD (Referring MD) Medicines:            Monitored Anesthesia Care Complications:        No immediate complications. Procedure:            Pre-Anesthesia Assessment:                       - Prior to the procedure, a History and Physical was                        performed, and patient medications, allergies and                        sensitivities were reviewed. The patient's tolerance of                        previous anesthesia was reviewed.                       - The risks and benefits of the procedure and the                        sedation options and risks were discussed with the                        patient. All questions were answered and informed                        consent was obtained.                       - ASA Grade Assessment: III - A patient with severe                        systemic disease.                       After obtaining informed consent, the endoscope was                        passed under direct vision. Throughout the procedure,                        the patient's blood pressure, pulse, and oxygen                        saturations were monitored continuously. The Endoscope                        was introduced through the mouth, and advanced to the  third part of duodenum. The upper GI endoscopy was                        accomplished with ease. The patient tolerated the                        procedure well. Findings:      The esophagus was normal.      The stomach was normal.      The examined duodenum was  normal. Impression:           - Normal esophagus.                       - Normal stomach.                       - Normal examined duodenum.                       - No specimens collected. Recommendation:       - Perform a colonoscopy today. Procedure Code(s):    --- Professional ---                       [phone], Esophagogastroduodenoscopy, flexible, transoral;                        diagnostic, including collection of specimen(s) by                        brushing or washing, when performed (separate procedure) Diagnosis Code(s):    --- Professional ---                       K92.1, Melena (includes Hematochezia) CPT copyright 2016 American Medical Association. All rights reserved. The codes documented in this report are preliminary and upon coder review may  be revised to meet current compliance requirements. Jonathon Bellows, MD Jonathon Bellows MD, MD 09/07/2017 2:09:36 PM This report has been signed electronically. Number of Addenda: 0 Note Initiated On: 09/07/2017 1:46 PM      [facility]

## 2017-09-07 NOTE — Anesthesia Preprocedure Evaluation (Signed)
Anesthesia Evaluation  Patient identified by MRN, date of birth, ID band Patient awake    Reviewed: Allergy & Precautions, H&P , NPO status , Patient's Chart, lab work & pertinent test results  History of Anesthesia Complications Negative for: history of anesthetic complications  Airway Mallampati: III  TM Distance: >3 FB Neck ROM: limited    Dental  (+) Chipped, Caps   Pulmonary neg pulmonary ROS, neg shortness of breath,    Pulmonary exam normal breath sounds clear to auscultation       Cardiovascular Exercise Tolerance: Good hypertension, (-) angina(-) Past MI and (-) DOE Normal cardiovascular exam Rhythm:regular Rate:Normal     Neuro/Psych PSYCHIATRIC DISORDERS Anxiety negative neurological ROS     GI/Hepatic negative GI ROS, Neg liver ROS, neg GERD  ,  Endo/Other  negative endocrine ROS  Renal/GU Renal disease     Musculoskeletal  (+) Arthritis ,   Abdominal   Peds  Hematology negative hematology ROS (+)   Anesthesia Other Findings Signs and symptoms suggestive of sleep apnea   Patient reports no problems with oxycodone or fentanyl.  Patient reports that she has not been told to not take acetaminophen   Past Medical History: No date: Angiolipoma of kidney     Comment: followed with serial CTs ,,  Dr. Jacqlyn Larsen No date: Anxiety No date: Arthritis No date: Cancer [facility])     Comment: colon No date: Chemotherapy induced diarrhea No date: Hammer toe No date: Hypertension No date: Liver cancer ([place]) No date: Neuro-endocrine carcinoma ([facility]) No date: Obesity (BMI 30-39.9)  Past Surgical History: 1995: ABDOMINAL HYSTERECTOMY     Comment: endometriosis, heavy bleeding No date: BUNIONECTOMY Bilateral 1985: CESAREAN SECTION 10/09/2015: COLONOSCOPY WITH PROPOFOL N/A     Comment: Procedure: COLONOSCOPY WITH PROPOFOL;                Surgeon: Robert Bellow, MD;  Location: [facility]              ENDOSCOPY;   Service: Endoscopy;  Laterality:               N/A; 04/07/2016: IR GENERIC HISTORICAL     Comment: IR RADIOLOGIST EVAL & MGMT 04/07/2016 Arne Cleveland, MD [facility] INTERV RAD No date: OOPHORECTOMY 10/01/2015: PORTACATH PLACEMENT Left     Comment: Procedure: INSERTION PORT-A-CATH;  Surgeon:               Robert Bellow, MD;  Location: [facility] ORS;                Service: General;  Laterality: Left; 2008: ROTATOR CUFF REPAIR Right     Comment: right shoulder.  [place]   BMI    Body Mass Index:  32.96 kg/m      Reproductive/Obstetrics negative OB ROS                             Anesthesia Physical  Anesthesia Plan  ASA: III  Anesthesia Plan: General   Post-op Pain Management:    Induction: Intravenous  PONV Risk Score and Plan: 3 and Propofol infusion  Airway Management Planned: Natural Airway and Nasal Cannula  Additional Equipment:   Intra-op Plan:   Post-operative Plan:   Informed Consent: I have reviewed the patients History and Physical, chart, labs and discussed the procedure including the risks, benefits and alternatives for the proposed anesthesia with the  patient or authorized representative who has indicated his/her understanding and acceptance.   Dental Advisory Given  Plan Discussed with: Anesthesiologist, CRNA and Surgeon  Anesthesia Plan Comments: (Patient consented for risks of anesthesia including but not limited to:  - adverse reactions to medications - damage to teeth, lips or other oral mucosa - sore throat or hoarseness - Damage to heart, brain, lungs or loss of life  Patient voiced understanding.)        Anesthesia Quick Evaluation

## 2017-09-07 NOTE — H&P (Signed)
Kathleen Bellows, MD [address] [place], [place], [place], [address] [address], [place], [place], [place], [address] Phone: [phone]  Fax: [phone]  Primary Care Physician:  [name], MD   Pre-Procedure History & Physical: HPI:  Kathleen James is a 67 y.o. female is here for an endoscopy and colonoscopy    Past Medical History:  Diagnosis Date  . Angiolipoma of kidney    followed with serial CTs ,,  Dr. Jacqlyn Larsen  . Anxiety   . Arthritis   . 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 Chemotherapy induced diarrhea   . Hammer toe   . Hypertension   . Liver cancer ([place])   . 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 Obesity (BMI 30-39.9)     Past Surgical History:  Procedure Laterality Date  . ABDOMINAL HYSTERECTOMY  1995   endometriosis, heavy bleeding  . BUNIONECTOMY Bilateral   . CESAREAN SECTION  1985  . COLONOSCOPY WITH PROPOFOL N/A 10/09/2015   Procedure: COLONOSCOPY WITH PROPOFOL;  Surgeon: Robert Bellow, MD;  Location: [facility] ENDOSCOPY;  Service: Endoscopy;  Laterality: N/A;  . IR ANGIOGRAM SELECTIVE EACH ADDITIONAL VESSEL  11/22/2016  . IR ANGIOGRAM SELECTIVE EACH ADDITIONAL VESSEL  11/22/2016  . IR ANGIOGRAM SELECTIVE EACH ADDITIONAL VESSEL  11/22/2016  . IR ANGIOGRAM SELECTIVE EACH ADDITIONAL VESSEL  12/07/2016  . IR ANGIOGRAM SELECTIVE EACH ADDITIONAL VESSEL  12/07/2016  . IR ANGIOGRAM VISCERAL SELECTIVE  11/22/2016  . IR ANGIOGRAM VISCERAL SELECTIVE  12/07/2016  . IR EMBO ARTERIAL NOT HEMORR HEMANG INC GUIDE ROADMAPPING  11/22/2016  . IR EMBO TUMOR ORGAN ISCHEMIA INFARCT INC GUIDE ROADMAPPING  12/07/2016  . IR GENERIC HISTORICAL  04/07/2016   IR RADIOLOGIST EVAL & MGMT 04/07/2016 Arne Cleveland, MD [facility] INTERV RAD  . IR RADIOLOGIST EVAL & MGMT  04/14/2017  . IR US GUIDE VASC ACCESS RIGHT  11/22/2016  . IR US GUIDE VASC ACCESS RIGHT  12/07/2016  . LAPAROSCOPIC RIGHT COLECTOMY Right 10/26/2016   Procedure:  LAPAROSCOPIC RIGHT COLECTOMY;  Surgeon: Robert Bellow, MD;  Location: [facility] ORS;  Service: General;  Laterality: Right;  . OOPHORECTOMY    . PORTACATH PLACEMENT Left 10/01/2015   Procedure: INSERTION PORT-A-CATH;  Surgeon: Robert Bellow, MD;  Location: [facility] ORS;  Service: General;  Laterality: Left;  . ROTATOR CUFF REPAIR Right 2008   right shoulder.  [place]     Prior to Admission medications   Medication Sig Start Date End Date Taking? Authorizing Provider  ALPRAZolam (XANAX) 0.25 MG tablet Take 1 tablet (0.25 mg total) by mouth 2 (two) times daily as needed for anxiety. 04/21/17  Yes [name], MD  Binimetinib 15 MG TABS Take 45 mg every 12 (twelve) hours by mouth. Patient taking differently: Take 30 mg by mouth every 12 (twelve) hours.  05/02/17  Yes [name], MD  Biotin w/ Vitamins C & E (HAIR/SKIN/NAILS PO) Take 1 tablet by mouth daily.   Yes [provider]  Chlorpheniramine Maleate (CHLOR-TABLETS PO) Take 1 tablet by mouth every evening.   Yes [provider]  diphenoxylate-atropine (LOMOTIL) 2.5-0.025 MG tablet TAKE 1 TABLET FOUR TIMES DAILY AS NEEDEDFOR DIARRHEA OR LOOSE STOOLS 06/22/17  Yes [name], MD  Encorafenib 75 MG CAPS Take 300 mg daily by mouth. Patient taking differently: Take 225 mg by mouth daily.  05/02/17  Yes [name], MD  lidocaine-prilocaine (EMLA) cream Apply 1  application topically as needed. Apply to port a cath site 1 hour prior to chemotherapy treatments 08/30/16  Yes [name], MD  loperamide (IMODIUM A-D) 2 MG tablet Take 2 mg by mouth 4 (four) times daily as needed for diarrhea or loose stools.   Yes [provider]  metoprolol tartrate (LOPRESSOR) 25 MG tablet Take 1 tablet (25 mg total) by mouth 2 (two) times daily. 06/06/17  Yes [name], MD  naproxen sodium (ANAPROX) 220 MG tablet Take 440 mg by mouth 2 (two) times daily as needed.   Yes [provider]  ondansetron (ZOFRAN) 8 MG tablet Take 1 tablet (8 mg total) by mouth every 8 (eight) hours as needed for nausea or vomiting. 01/26/17  Yes [name], MD  potassium chloride (K-DUR) 10 MEQ tablet TAKE ONE TABLET BY MOUTH TWICE DAILY 08/02/17  Yes [name], MD  prochlorperazine (COMPAZINE) 10 MG tablet TAKE ONE TABLET BY MOUTH EVERY 6 HOURS AS NEEDED FOR NAUSEA OR VOMITING 12/31/16  Yes [name], MD  triamcinolone ointment (KENALOG) 0.5 % Apply 1 application topically 2 (two) times daily. To rash 05/26/17  Yes Jacquelin Hawking, NP    Allergies as of 09/06/2017 - Review Complete 09/06/2017  Allergen Reaction Noted  . Bee venom Hives 08/05/2015  . Dilaudid [hydromorphone hcl] Nausea And Vomiting 11/22/2016  . Morphine and related Nausea Only 05/19/2011    Family History  Problem Relation Age of Onset  . Mental illness Mother 32       bipolar, dementia  . Cancer Maternal Aunt 70       breast ca  . Hypertension Father   . Stroke Father 76       deceased  . COPD Father   . Heart disease Sister        deceased    Social History   Socioeconomic History  . Marital status: Married    Spouse name: Not on file  . Number of children: Not on file  . Years of education: Not on file  . Highest education level: Not on file  Social Needs  . Financial resource strain: Not on file  . Food insecurity - worry: Not on file  . Food insecurity - inability: Not on file  . Transportation needs - medical: Not on file  . Transportation needs - non-medical: Not on file  Occupational History  . Not on file  Tobacco Use  . Smoking status: Never Smoker  . Smokeless tobacco: Never Used  Substance and Sexual Activity  . Alcohol use: Yes    Alcohol/week: 0.0 oz    Comment: occasionally  . Drug use: No  . Sexual activity: Not Currently  Other Topics Concern  . Not on file  Social History Narrative  . Not on file    Review of Systems: See HPI,  otherwise negative ROS  Physical Exam: BP (!) 148/78   Pulse 83   Temp 97.8 F (36.6 C) (Oral)   Resp 17   Ht 5\' 4"  (1.626 m)   Wt 200 lb 14.4 oz (91.1 kg)   SpO2 100%   BMI 34.48 kg/m  General:   Alert,  pleasant and cooperative in NAD Head:  Normocephalic and atraumatic. Neck:  Supple; no masses or thyromegaly. Lungs:  Clear throughout to auscultation, normal respiratory effort.    Heart:  +S1, +S2, Regular rate and rhythm, No edema. Abdomen:  Soft, nontender and nondistended. Normal bowel sounds, without guarding, and without  rebound.   Neurologic:  Alert and  oriented x4;  grossly normal neurologically.  Impression/Plan: Kathleen James is here for an endoscopy and colonoscopy  to be performed for  evaluation of rectal bleeding     Risks, benefits, limitations, and alternatives regarding endoscopy have been reviewed with the patient.  Questions have been answered.  All parties agreeable.   Kathleen Bellows, MD  09/07/2017, 1:45 PM

## 2017-09-08 ENCOUNTER — Other Ambulatory Visit: Payer: Self-pay | Admitting: *Deleted

## 2017-09-08 ENCOUNTER — Encounter: Payer: Self-pay | Admitting: Internal Medicine

## 2017-09-08 DIAGNOSIS — C799 Secondary malignant neoplasm of unspecified site: Principal | ICD-10-CM

## 2017-09-08 DIAGNOSIS — D6481 Anemia due to antineoplastic chemotherapy: Secondary | ICD-10-CM

## 2017-09-08 DIAGNOSIS — C189 Malignant neoplasm of colon, unspecified: Secondary | ICD-10-CM

## 2017-09-08 DIAGNOSIS — C19 Malignant neoplasm of rectosigmoid junction: Secondary | ICD-10-CM | POA: Diagnosis not present

## 2017-09-08 DIAGNOSIS — T451X5A Adverse effect of antineoplastic and immunosuppressive drugs, initial encounter: Secondary | ICD-10-CM

## 2017-09-08 DIAGNOSIS — C787 Secondary malignant neoplasm of liver and intrahepatic bile duct: Secondary | ICD-10-CM | POA: Diagnosis not present

## 2017-09-08 DIAGNOSIS — C7800 Secondary malignant neoplasm of unspecified lung: Secondary | ICD-10-CM | POA: Diagnosis not present

## 2017-09-08 NOTE — Anesthesia Postprocedure Evaluation (Signed)
Anesthesia Post Note  Patient: CLAIRISSA VALVANO  Procedure(s) Performed: ESOPHAGOGASTRODUODENOSCOPY (EGD) WITH PROPOFOL (N/A ) COLONOSCOPY WITH PROPOFOL (N/A )  Patient location during evaluation: Endoscopy Anesthesia Type: General Level of consciousness: awake and alert Pain management: pain level controlled Vital Signs Assessment: post-procedure vital signs reviewed and stable Respiratory status: spontaneous breathing, nonlabored ventilation, respiratory function stable and patient connected to nasal cannula oxygen Cardiovascular status: blood pressure returned to baseline and stable Postop Assessment: no apparent nausea or vomiting Anesthetic complications: no     Last Vitals:  Vitals Value Taken Time  BP    Temp    Pulse    Resp    SpO2      Last Pain:  Vitals:   09/07/17 1427  TempSrc: Tympanic  PainSc: Asleep                 Martha Clan

## 2017-09-09 ENCOUNTER — Encounter: Payer: Self-pay | Admitting: Gastroenterology

## 2017-09-09 LAB — SURGICAL PATHOLOGY

## 2017-09-12 ENCOUNTER — Inpatient Hospital Stay (HOSPITAL_BASED_OUTPATIENT_CLINIC_OR_DEPARTMENT_OTHER): Payer: Medicare HMO | Admitting: Internal Medicine

## 2017-09-12 ENCOUNTER — Inpatient Hospital Stay: Payer: Medicare HMO

## 2017-09-12 ENCOUNTER — Encounter: Payer: Self-pay | Admitting: Internal Medicine

## 2017-09-12 ENCOUNTER — Other Ambulatory Visit: Payer: Self-pay

## 2017-09-12 VITALS — BP 151/89 | HR 75 | Temp 96.7°F | Wt 197.0 lb

## 2017-09-12 DIAGNOSIS — D6481 Anemia due to antineoplastic chemotherapy: Secondary | ICD-10-CM

## 2017-09-12 DIAGNOSIS — Z79899 Other long term (current) drug therapy: Secondary | ICD-10-CM | POA: Diagnosis not present

## 2017-09-12 DIAGNOSIS — D649 Anemia, unspecified: Secondary | ICD-10-CM

## 2017-09-12 DIAGNOSIS — K921 Melena: Secondary | ICD-10-CM

## 2017-09-12 DIAGNOSIS — N179 Acute kidney failure, unspecified: Secondary | ICD-10-CM | POA: Diagnosis not present

## 2017-09-12 DIAGNOSIS — M1990 Unspecified osteoarthritis, unspecified site: Secondary | ICD-10-CM

## 2017-09-12 DIAGNOSIS — R5383 Other fatigue: Secondary | ICD-10-CM

## 2017-09-12 DIAGNOSIS — C182 Malignant neoplasm of ascending colon: Secondary | ICD-10-CM

## 2017-09-12 DIAGNOSIS — I10 Essential (primary) hypertension: Secondary | ICD-10-CM

## 2017-09-12 DIAGNOSIS — Z5112 Encounter for antineoplastic immunotherapy: Secondary | ICD-10-CM | POA: Diagnosis not present

## 2017-09-12 DIAGNOSIS — C787 Secondary malignant neoplasm of liver and intrahepatic bile duct: Secondary | ICD-10-CM

## 2017-09-12 DIAGNOSIS — C189 Malignant neoplasm of colon, unspecified: Secondary | ICD-10-CM

## 2017-09-12 DIAGNOSIS — R21 Rash and other nonspecific skin eruption: Secondary | ICD-10-CM | POA: Diagnosis not present

## 2017-09-12 DIAGNOSIS — T451X5A Adverse effect of antineoplastic and immunosuppressive drugs, initial encounter: Secondary | ICD-10-CM

## 2017-09-12 DIAGNOSIS — C799 Secondary malignant neoplasm of unspecified site: Principal | ICD-10-CM

## 2017-09-12 LAB — COMPREHENSIVE METABOLIC PANEL
ALK PHOS: 105 U/L (ref 38–126)
ALT: 18 U/L (ref 14–54)
ANION GAP: 10 (ref 5–15)
AST: 26 U/L (ref 15–41)
Albumin: 3.2 g/dL — ABNORMAL LOW (ref 3.5–5.0)
BUN: 16 mg/dL (ref 6–20)
CALCIUM: 8.8 mg/dL — AB (ref 8.9–10.3)
CHLORIDE: 106 mmol/L (ref 101–111)
CO2: 20 mmol/L — AB (ref 22–32)
CREATININE: 1.34 mg/dL — AB (ref 0.44–1.00)
GFR calc non Af Amer: 40 mL/min — ABNORMAL LOW (ref >60)
GFR, EST AFRICAN AMERICAN: 46 mL/min — AB (ref >60)
Glucose, Bld: 92 mg/dL (ref 65–99)
Potassium: 3.8 mmol/L (ref 3.5–5.1)
SODIUM: 136 mmol/L (ref 135–145)
Total Bilirubin: 0.5 mg/dL (ref 0.3–1.2)
Total Protein: 6.8 g/dL (ref 6.5–8.1)

## 2017-09-12 LAB — CBC WITH DIFFERENTIAL/PLATELET
BASOS PCT: 1 %
Basophils Absolute: 0.1 10*3/uL (ref 0–0.1)
EOS ABS: 0.3 10*3/uL (ref 0–0.7)
Eosinophils Relative: 5 %
HEMATOCRIT: 24.3 % — AB (ref 35.0–47.0)
HEMOGLOBIN: 8.2 g/dL — AB (ref 12.0–16.0)
Lymphocytes Relative: 15 %
Lymphs Abs: 0.7 10*3/uL — ABNORMAL LOW (ref 1.0–3.6)
MCH: 31.6 pg (ref 26.0–34.0)
MCHC: 33.8 g/dL (ref 32.0–36.0)
MCV: 93.6 fL (ref 80.0–100.0)
MONOS PCT: 10 %
Monocytes Absolute: 0.5 10*3/uL (ref 0.2–0.9)
NEUTROS PCT: 69 %
Neutro Abs: 3.5 10*3/uL (ref 1.4–6.5)
Platelets: 373 10*3/uL (ref 150–440)
RBC: 2.6 MIL/uL — AB (ref 3.80–5.20)
RDW: 14.9 % — ABNORMAL HIGH (ref 11.5–14.5)
WBC: 5 10*3/uL (ref 3.6–11.0)

## 2017-09-12 LAB — SAMPLE TO BLOOD BANK

## 2017-09-12 NOTE — Progress Notes (Signed)
Patient here today for follow up.   

## 2017-09-12 NOTE — Assessment & Plan Note (Addendum)
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 Jan 24th 2019-PET - improved metastatic lesions to the liver; improved in retroperitoneal lymph nodes 7mm in size. CEA- 92/rising slowly.   # Patient currently on Vectibex [cut dose to 4 mg/kg]; Re-START today- Enco 3 pills/ Bini 2 pills BID.    # acute anemia- ? Lower GI bleed s/p colo [Dr.Anna] ? NSAIDs indcued.  Colonoscopy benign ulceration around the anastomosis.  Currently resolved.  Will discuss with Dr. Bary Castilla.  #Hypomagnesemia magnesium 1.5; secondary to vectibex;   # Skin rash- sec to vectibex; improved/ clindamycin gel.   # acute renal failure/CKD- ? Etiology- creat 1.3/ stable. appt with Dr.Singh on 29th   # follow up as planned.

## 2017-09-14 ENCOUNTER — Other Ambulatory Visit: Payer: Self-pay | Admitting: *Deleted

## 2017-09-14 ENCOUNTER — Encounter: Payer: Self-pay | Admitting: Internal Medicine

## 2017-09-14 MED ORDER — ALPRAZOLAM 0.25 MG PO TABS: 0.2500 mg | ORAL_TABLET | Freq: Two times a day (BID) | ORAL | 3 refills | Status: DC | PRN

## 2017-09-15 NOTE — Progress Notes (Signed)
[place] NOTE  [name], [name], MD (Internal Medicine) Bary Castilla, Forest Gleason, [name], RN as Registered Nurse [name], MD as Consulting Physician (Internal Medicine)  CHIEF COMPLAINTS/PURPOSE OF CONSULTATION:   Oncology History   # MARCH/APRIL 2017-NEUROENDOCRINE CA STAGE IV; [name]; [name]; [name]; [name]; [name]; [name]; [name]; July 14th  2017 CT- PR  # AUG 28th- FOLFIRI +Avastin; OCT 5th CT scan- PR.   # MARCH 20th- CT STABLE liver lesions; ? Enlarging cecal lesion [CEA rising]; March 26th- FOLFOX [last avastin march 12th 2018];   # resection of primary tumor [Dr.Byrnett]; y90- June 19th 2018.  # July 11th-FOLFOX + Avastin; SEP 2018 [CT- stable liver lesions; increasing ~34mm RP LN]; RISING CEA [stopped end of Sep 2018 ]  # OCT 15th 2018- Vemu+ Iri+ Pan [mid nov stopped iri- neutropenia/dia+vemu- arthralgias]; 05/11/2017-Started enco+Bini+vectibex   # [facility]- B-RAF V600E- MUTATED [May 2018]  # right kidney lesion 2.6 x 1.6 cm ? Angiolipoma [followed by Urology in past]     Malignant neoplasm of ascending colon ([place])     HISTORY OF PRESENTING ILLNESS:  Kathleen James 67 y.o.  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  In the interim patient was admitted to the hospital for rectal bleeding/hemoglobin dropped to 8.3 from a baseline of 10.  Patient underwent colonoscopy upper endoscopy; colonoscopy -question ulcerated lesion in the colon.   In the interim patient was also evaluated at [facility] possible left  clinical trials down the line.  At the current time she is recommended to continue current therapy.  She denies any further bleeding.  Denies any nausea vomiting.  Denies any chest pain or shortness of breath or cough.  Mild diarrhea.  ROS: A complete 10 point review of system is done which is negative except mentioned above in history of present illness  MEDICAL HISTORY:  Past Medical History:  Diagnosis Date  . Angiolipoma of kidney    followed with serial CTs ,,  Dr. Jacqlyn Larsen  . Anxiety   . Arthritis   . 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 Chemotherapy induced diarrhea   . Hammer toe   . Hypertension   . Liver cancer ([place])   . 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 Obesity (BMI 30-39.9)     SURGICAL HISTORY: Past Surgical History:  Procedure Laterality Date  . ABDOMINAL HYSTERECTOMY  1995   endometriosis, heavy bleeding  . BUNIONECTOMY Bilateral   . CESAREAN SECTION  1985  . COLONOSCOPY WITH PROPOFOL N/A 10/09/2015   Procedure: COLONOSCOPY WITH PROPOFOL;  Surgeon: Robert Bellow, MD;  Location: [facility] ENDOSCOPY;  Service: Endoscopy;  Laterality: N/A;  . COLONOSCOPY WITH PROPOFOL N/A 09/07/2017   Procedure: COLONOSCOPY WITH PROPOFOL;  Surgeon: Jonathon Bellows, MD;  Location: [facility] ENDOSCOPY;  Service: Gastroenterology;  Laterality: N/A;  . ESOPHAGOGASTRODUODENOSCOPY (EGD) WITH PROPOFOL N/A 09/07/2017   Procedure: ESOPHAGOGASTRODUODENOSCOPY (EGD) WITH PROPOFOL;  Surgeon: Jonathon Bellows, MD;  Location: [facility] ENDOSCOPY;  Service: Gastroenterology;  Laterality: N/A;  . IR ANGIOGRAM SELECTIVE EACH ADDITIONAL VESSEL  11/22/2016  .  IR ANGIOGRAM SELECTIVE EACH ADDITIONAL VESSEL  11/22/2016  . IR ANGIOGRAM SELECTIVE EACH ADDITIONAL VESSEL  11/22/2016  . IR ANGIOGRAM SELECTIVE EACH ADDITIONAL VESSEL  12/07/2016  . IR ANGIOGRAM SELECTIVE EACH ADDITIONAL VESSEL  12/07/2016  . IR ANGIOGRAM VISCERAL SELECTIVE  11/22/2016  . IR ANGIOGRAM VISCERAL SELECTIVE   12/07/2016  . IR EMBO ARTERIAL NOT HEMORR HEMANG INC GUIDE ROADMAPPING  11/22/2016  . IR EMBO TUMOR ORGAN ISCHEMIA INFARCT INC GUIDE ROADMAPPING  12/07/2016  . IR GENERIC HISTORICAL  04/07/2016   IR RADIOLOGIST EVAL & MGMT 04/07/2016 Arne Cleveland, MD [facility] INTERV RAD  . IR RADIOLOGIST EVAL & MGMT  04/14/2017  . IR US GUIDE VASC ACCESS RIGHT  11/22/2016  . IR US GUIDE VASC ACCESS RIGHT  12/07/2016  . LAPAROSCOPIC RIGHT COLECTOMY Right 10/26/2016   Procedure: LAPAROSCOPIC RIGHT COLECTOMY;  Surgeon: Robert Bellow, MD;  Location: [facility] ORS;  Service: General;  Laterality: Right;  . OOPHORECTOMY    . PORTACATH PLACEMENT Left 10/01/2015   Procedure: INSERTION PORT-A-CATH;  Surgeon: Robert Bellow, MD;  Location: [facility] ORS;  Service: General;  Laterality: Left;  . ROTATOR CUFF REPAIR Right 2008   right shoulder.  [place]     SOCIAL HISTORY: She lives at home with her family in [place]. 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 Social History   Socioeconomic History  . Marital status: Married    Spouse name: Not on file  . Number of children: Not on file  . Years of education: Not on file  . Highest education level: Not on file  Occupational History  . Not on file  Social Needs  . Financial resource strain: Not on file  . Food insecurity:    Worry: Not on file    Inability: Not on file  . Transportation needs:    Medical: Not on file    Non-medical: Not on file  Tobacco Use  . Smoking status: Never Smoker  . Smokeless tobacco: Never Used  Substance and Sexual Activity  . Alcohol use: Yes    Alcohol/week: 0.0 oz    Comment: occasionally  . Drug use: No  . Sexual activity: Not Currently  Lifestyle  . Physical activity:    Days per week: Not on file    Minutes per session: Not on file  . Stress: Not on file  Relationships  . Social connections:    Talks on phone: Not on file    Gets together: Not on file    Attends religious service:  Not on file    Active member of club or organization: Not on file    Attends meetings of clubs or organizations: Not on file    Relationship status: Not on file  . Intimate partner violence:    Fear of current or ex partner: Not on file    Emotionally abused: Not on file    Physically abused: Not on file    Forced sexual activity: Not on file  Other Topics Concern  . Not on file  Social History Narrative  . Not on file    FAMILY HISTORY: Family History  Problem Relation Age of Onset  . Mental illness Mother 105       bipolar, dementia  . Cancer Maternal Aunt 70       breast ca  . Hypertension Father   . Stroke Father 39       deceased  . COPD Father   .  Heart disease Sister        deceased    ALLERGIES:  is allergic to bee venom; dilaudid [hydromorphone hcl]; and morphine and related.  MEDICATIONS:  Current Outpatient Medications  Medication Sig Dispense Refill  . Binimetinib 15 MG TABS Take 45 mg every 12 (twelve) hours by mouth. (Patient taking differently: Take 30 mg by mouth every 12 (twelve) hours. ) 180 tablet 4  . Biotin w/ Vitamins C & E (HAIR/SKIN/NAILS PO) Take 1 tablet by mouth daily.    . Chlorpheniramine Maleate (CHLOR-TABLETS PO) Take 1 tablet by mouth every evening.    . diphenoxylate-atropine (LOMOTIL) 2.5-0.025 MG tablet TAKE 1 TABLET FOUR TIMES DAILY AS NEEDEDFOR DIARRHEA OR LOOSE STOOLS 30 tablet 3  . lidocaine-prilocaine (EMLA) cream Apply 1 application topically as needed. Apply to port a cath site 1 hour prior to chemotherapy treatments 30 g 6  . loperamide (IMODIUM A-D) 2 MG tablet Take 2 mg by mouth 4 (four) times daily as needed for diarrhea or loose stools.    . metoprolol tartrate (LOPRESSOR) 25 MG tablet Take 1 tablet (25 mg total) by mouth 2 (two) times daily. 60 tablet 3  . ondansetron (ZOFRAN) 8 MG tablet Take 1 tablet (8 mg total) by mouth every 8 (eight) hours as needed for nausea or vomiting. 40 tablet 3  . pantoprazole (PROTONIX) 40 MG  tablet Take 1 tablet (40 mg total) by mouth daily. 30 tablet 1  . potassium chloride (K-DUR) 10 MEQ tablet TAKE ONE TABLET BY MOUTH TWICE DAILY 60 tablet 3  . prochlorperazine (COMPAZINE) 10 MG tablet TAKE ONE TABLET BY MOUTH EVERY 6 HOURS AS NEEDED FOR NAUSEA OR VOMITING 30 tablet 2  . triamcinolone ointment (KENALOG) 0.5 % Apply 1 application topically 2 (two) times daily. To rash 30 g 0  . ALPRAZolam (XANAX) 0.25 MG tablet Take 1 tablet (0.25 mg total) by mouth 2 (two) times daily as needed for anxiety. 60 tablet 3   No current facility-administered medications for this visit.    Facility-Administered Medications Ordered in Other Visits  Medication Dose Route Frequency Provider Last Rate Last Dose  . heparin lock flush 100 unit/mL  500 Units Intravenous Once Charlaine Dalton R, MD      . sodium chloride flush (NS) 0.9 % injection 10 mL  10 mL Intravenous PRN [name], MD   10 mL at 11/24/15 0836      .  PHYSICAL EXAMINATION: ECOG PERFORMANCE STATUS: 0 - Asymptomatic  Vitals:   09/12/17 0931  BP: (!) 151/89  Pulse: 75  Temp: (!) 96.7 F (35.9 C)   Filed Weights   09/12/17 0931  Weight: 197 lb (89.4 kg)    GENERAL: Well-nourished well-developed; Alert, no distress and comfortable.  She is alone. EYES: no pallor or icterus OROPHARYNX: no thrush or ulceration; good dentition  NECK: supple, no masses felt LYMPH:  no palpable lymphadenopathy in the cervical, axillary or inguinal regions LUNGS: clear to auscultation and  No wheeze or crackles HEART/CVS: regular rate & rhythm and no murmurs; No lower extremity edema ABDOMEN: abdomen soft, non-tender and normal bowel sounds; positive for hepatomegaly. Musculoskeletal:no cyanosis of digits and no clubbing  PSYCH: alert & oriented x 3 with fluent speech NEURO: no focal motor/sensory deficits SKIN:  Acne-like rash noted on the torso/face.  LABORATORY DATA:  I have reviewed the data as listed Lab Results   Component Value Date   WBC 5.0 09/12/2017   HGB 8.2 (L) 09/12/2017   HCT  24.3 (L) 09/12/2017   MCV 93.6 09/12/2017   PLT 373 09/12/2017   Recent Labs    09/05/17 0920 09/06/17 1125 09/12/17 0910  NA 140 136 136  K 4.1 4.7 3.8  CL 111 108 106  CO2 21* 21* 20*  GLUCOSE 112* 86 92  BUN 22* 25* 16  CREATININE 1.72* 1.75* 1.34*  CALCIUM 8.5* 8.4* 8.8*  GFRNONAA 30* 29* 40*  GFRAA 34* 34* 46*  PROT 6.4* 6.3* 6.8  ALBUMIN 2.7* 2.9* 3.2*  AST 28 26 26   ALT 16 17 18   ALKPHOS 101 91 105  BILITOT 0.5 0.6 0.5    RADIOGRAPHIC STUDIES: I have personally reviewed the radiological images as listed and agreed with the findings in the report. No results found.  Results for Kathleen, James (MRN [id number]) as of 07/04/2017 09:30  Ref. Range 09/23/2015 15:04 11/03/2015 08:34 12/10/2015 08:51 12/24/2015 08:50 01/07/2016 09:07 02/02/2016 09:03 03/01/2016 11:00 03/29/2016 09:06 04/26/2016 10:31 05/24/2016 09:50 06/28/2016 08:32 08/09/2016 09:18 08/30/2016 10:13 09/13/2016 09:35 09/27/2016 09:35 10/11/2016 08:38 10/25/2016 08:19 11/04/2016 10:09 11/17/2016 08:29 12/29/2016 11:32 01/12/2017 09:29 01/26/2017 09:31 02/09/2017 09:02 02/28/2017 09:04 03/14/2017 11:30 03/23/2017 09:40 04/18/2017 10:15 04/25/2017 13:00 05/02/2017 08:25 05/16/2017 08:53 05/25/2017 10:50 05/31/2017 12:20 1July 10, 202018 11:52 06/28/2017 14:31  CEA Latest Ref Range: 0.0 - 4.7 ng/mL 538.7 (H) 860.5 (H) 1,167.0 (H) 658.6 (H) 415.6 (H) 242.1 (H) 173.6 (H) 118.8 (H) 100.6 (H) 91.5 (H) 94.5 (H) 141.5 (H) 181.2 (H) 197.3 (H) 215.3 (H) 254.9 (H) 300.7 (H) 254.9 (H) 268.6 (H)                 CEA Latest Ref Range: 0.0 - 4.7 ng/mL                    638.7 (H) 521.8 (H) 398.7 (H) 386.8 (H) 422.2 (H) 529.1 (H) 537.2 (H) 397.9 (H) 263.6 (H) 155.8 (H) 213.0 (H) 132.2 (H) 87.9 (H) 57.0 (H) 164.5 (H)   IMPRESSION: 1. Continued further decrease in size of the dominant posterior right hepatic dome lesion demonstrating no hypermetabolic FDG accumulation above background  parenchymal levels on today's study. No other hypermetabolic liver metastases are evident on the current exam. 2. No hypermetabolic lymphadenopathy in the chest, abdomen, or pelvis to suggest distant hypermetabolic metastatic involvement. The aortocaval lymph node in question on the recent diagnostic CT scan has decreased in size in the interval. 3.  Aortic Atherosclerois (ICD10-170.0)   Electronically Signed   By: Misty Stanley M.D.   On: 07/14/2017 10:36  Results for Kathleen, LEFEBER (MRN [id number]) as of 09/05/2017 10:14  Ref. 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 Jan 24th 2019-PET - improved metastatic lesions to the liver; improved in retroperitoneal lymph nodes 66mm in size. CEA- 92/rising slowly.   # Patient currently on Vectibex [cut dose to 4 mg/kg]; Re-START today- Enco 3 pills/ Bini 2 pills BID.    # acute anemia- ? Lower GI bleed s/p colo [Dr.Anna] ? NSAIDs indcued.  Colonoscopy benign ulceration around the anastomosis.  Currently resolved.  Will discuss with Dr. Bary Castilla.  #Hypomagnesemia magnesium 1.5; secondary to vectibex;   #  Skin rash- sec to vectibex; improved/ clindamycin gel.   # acute renal failure/CKD- ? Etiology- creat 1.3/ stable. appt with Dr.Singh on 29th   # follow up as planned.       [name], MD 09/15/2017 4:59 PM

## 2017-09-16 DIAGNOSIS — N183 Chronic kidney disease, stage 3 (moderate): Secondary | ICD-10-CM | POA: Diagnosis not present

## 2017-09-16 DIAGNOSIS — N179 Acute kidney failure, unspecified: Secondary | ICD-10-CM | POA: Diagnosis not present

## 2017-09-19 ENCOUNTER — Inpatient Hospital Stay: Payer: Medicare HMO

## 2017-09-19 ENCOUNTER — Telehealth: Payer: Self-pay

## 2017-09-19 ENCOUNTER — Inpatient Hospital Stay (HOSPITAL_BASED_OUTPATIENT_CLINIC_OR_DEPARTMENT_OTHER): Payer: Medicare HMO | Admitting: Internal Medicine

## 2017-09-19 ENCOUNTER — Other Ambulatory Visit: Payer: Self-pay | Admitting: Internal Medicine

## 2017-09-19 ENCOUNTER — Inpatient Hospital Stay: Payer: Medicare HMO | Attending: Internal Medicine

## 2017-09-19 ENCOUNTER — Other Ambulatory Visit: Payer: Self-pay

## 2017-09-19 ENCOUNTER — Encounter: Payer: Self-pay | Admitting: Internal Medicine

## 2017-09-19 VITALS — BP 130/84 | HR 77 | Temp 97.9°F | Resp 20

## 2017-09-19 DIAGNOSIS — Z8719 Personal history of other diseases of the digestive system: Secondary | ICD-10-CM | POA: Diagnosis not present

## 2017-09-19 DIAGNOSIS — I129 Hypertensive chronic kidney disease with stage 1 through stage 4 chronic kidney disease, or unspecified chronic kidney disease: Secondary | ICD-10-CM | POA: Insufficient documentation

## 2017-09-19 DIAGNOSIS — Z79899 Other long term (current) drug therapy: Secondary | ICD-10-CM | POA: Diagnosis not present

## 2017-09-19 DIAGNOSIS — Z5112 Encounter for antineoplastic immunotherapy: Secondary | ICD-10-CM | POA: Diagnosis present

## 2017-09-19 DIAGNOSIS — T451X5A Adverse effect of antineoplastic and immunosuppressive drugs, initial encounter: Principal | ICD-10-CM

## 2017-09-19 DIAGNOSIS — D649 Anemia, unspecified: Secondary | ICD-10-CM

## 2017-09-19 DIAGNOSIS — N189 Chronic kidney disease, unspecified: Secondary | ICD-10-CM | POA: Insufficient documentation

## 2017-09-19 DIAGNOSIS — C787 Secondary malignant neoplasm of liver and intrahepatic bile duct: Secondary | ICD-10-CM

## 2017-09-19 DIAGNOSIS — C182 Malignant neoplasm of ascending colon: Secondary | ICD-10-CM

## 2017-09-19 DIAGNOSIS — Z803 Family history of malignant neoplasm of breast: Secondary | ICD-10-CM | POA: Insufficient documentation

## 2017-09-19 DIAGNOSIS — R21 Rash and other nonspecific skin eruption: Secondary | ICD-10-CM

## 2017-09-19 DIAGNOSIS — D500 Iron deficiency anemia secondary to blood loss (chronic): Secondary | ICD-10-CM | POA: Insufficient documentation

## 2017-09-19 DIAGNOSIS — R970 Elevated carcinoembryonic antigen [CEA]: Secondary | ICD-10-CM

## 2017-09-19 DIAGNOSIS — C799 Secondary malignant neoplasm of unspecified site: Secondary | ICD-10-CM

## 2017-09-19 DIAGNOSIS — D6481 Anemia due to antineoplastic chemotherapy: Secondary | ICD-10-CM

## 2017-09-19 DIAGNOSIS — C189 Malignant neoplasm of colon, unspecified: Secondary | ICD-10-CM

## 2017-09-19 LAB — CBC WITH DIFFERENTIAL/PLATELET
BASOS PCT: 1 %
Basophils Absolute: 0 10*3/uL (ref 0–0.1)
EOS ABS: 0.2 10*3/uL (ref 0–0.7)
Eosinophils Relative: 3 %
HCT: 22.1 % — ABNORMAL LOW (ref 35.0–47.0)
Hemoglobin: 7.5 g/dL — ABNORMAL LOW (ref 12.0–16.0)
LYMPHS ABS: 0.7 10*3/uL — AB (ref 1.0–3.6)
Lymphocytes Relative: 13 %
MCH: 30.9 pg (ref 26.0–34.0)
MCHC: 34.2 g/dL (ref 32.0–36.0)
MCV: 90.3 fL (ref 80.0–100.0)
MONO ABS: 0.5 10*3/uL (ref 0.2–0.9)
MONOS PCT: 8 %
Neutro Abs: 4.3 10*3/uL (ref 1.4–6.5)
Neutrophils Relative %: 75 %
PLATELETS: 316 10*3/uL (ref 150–440)
RBC: 2.44 MIL/uL — ABNORMAL LOW (ref 3.80–5.20)
RDW: 15.1 % — ABNORMAL HIGH (ref 11.5–14.5)
WBC: 5.7 10*3/uL (ref 3.6–11.0)

## 2017-09-19 LAB — COMPREHENSIVE METABOLIC PANEL
ALK PHOS: 119 U/L (ref 38–126)
ALT: 17 U/L (ref 14–54)
AST: 26 U/L (ref 15–41)
Albumin: 3.1 g/dL — ABNORMAL LOW (ref 3.5–5.0)
Anion gap: 7 (ref 5–15)
BUN: 25 mg/dL — ABNORMAL HIGH (ref 6–20)
CALCIUM: 8.5 mg/dL — AB (ref 8.9–10.3)
CHLORIDE: 108 mmol/L (ref 101–111)
CO2: 20 mmol/L — AB (ref 22–32)
CREATININE: 1.66 mg/dL — AB (ref 0.44–1.00)
GFR calc Af Amer: 36 mL/min — ABNORMAL LOW (ref >60)
GFR calc non Af Amer: 31 mL/min — ABNORMAL LOW (ref >60)
Glucose, Bld: 106 mg/dL — ABNORMAL HIGH (ref 65–99)
Potassium: 4 mmol/L (ref 3.5–5.1)
SODIUM: 135 mmol/L (ref 135–145)
Total Bilirubin: 0.4 mg/dL (ref 0.3–1.2)
Total Protein: 6.5 g/dL (ref 6.5–8.1)

## 2017-09-19 LAB — IRON AND TIBC
IRON: 15 ug/dL — AB (ref 28–170)
Saturation Ratios: 4 % — ABNORMAL LOW (ref 10.4–31.8)
TIBC: 391 ug/dL (ref 250–450)
UIBC: 376 ug/dL

## 2017-09-19 LAB — ABO/RH: ABO/RH(D): A POS

## 2017-09-19 LAB — MAGNESIUM: MAGNESIUM: 1.7 mg/dL (ref 1.7–2.4)

## 2017-09-19 LAB — FERRITIN: Ferritin: 6 ng/mL — ABNORMAL LOW (ref 11–307)

## 2017-09-19 LAB — PREPARE RBC (CROSSMATCH)

## 2017-09-19 MED ORDER — SODIUM CHLORIDE 0.9% FLUSH
10.0000 mL | INTRAVENOUS | Status: DC | PRN
  Administered 2017-09-19: 10 mL
  Filled 2017-09-19: qty 10

## 2017-09-19 MED ORDER — SODIUM CHLORIDE 0.9 % IV SOLN
Freq: Once | INTRAVENOUS | Status: AC
  Administered 2017-09-19: 11:00:00 via INTRAVENOUS
  Filled 2017-09-19: qty 1000

## 2017-09-19 MED ORDER — ONDANSETRON HCL 4 MG/2ML IJ SOLN
8.0000 mg | Freq: Once | INTRAMUSCULAR | Status: AC
  Administered 2017-09-19: 8 mg via INTRAVENOUS
  Filled 2017-09-19: qty 4

## 2017-09-19 MED ORDER — SODIUM CHLORIDE 0.9 % IV SOLN: Freq: Once | INTRAVENOUS | Status: DC

## 2017-09-19 MED ORDER — SODIUM CHLORIDE 0.9 % IV SOLN
4.0000 mg/kg | Freq: Once | INTRAVENOUS | Status: AC
  Administered 2017-09-19: 360 mg via INTRAVENOUS
  Filled 2017-09-19: qty 18

## 2017-09-19 MED ORDER — HEPARIN SOD (PORK) LOCK FLUSH 100 UNIT/ML IV SOLN
500.0000 [IU] | Freq: Once | INTRAVENOUS | Status: AC | PRN
  Administered 2017-09-19: 500 [IU]
  Filled 2017-09-19: qty 5

## 2017-09-19 MED ORDER — DEXAMETHASONE SODIUM PHOSPHATE 10 MG/ML IJ SOLN
10.0000 mg | Freq: Once | INTRAMUSCULAR | Status: AC
  Administered 2017-09-19: 10 mg via INTRAVENOUS
  Filled 2017-09-19: qty 1

## 2017-09-19 NOTE — Telephone Encounter (Signed)
Spoke to patient and advised of results per Dr. Vicente Males.    - inform biopsies show ulceration- suggest no NSAID's, Mobic, BC powder or Goodies   Information sent to PCP as well.

## 2017-09-19 NOTE — Telephone Encounter (Signed)
LVM for patient callback for results per Dr. Vicente Males.    - inform biopsies show ulceration- suggest no NSAID's, Mobic, BC powder or Goodies

## 2017-09-19 NOTE — Progress Notes (Signed)
[place] NOTE  [name], [name], MD (Internal Medicine) Bary Castilla, Forest Gleason, [name], RN as Registered Nurse [name], MD as Consulting Physician (Internal Medicine)  CHIEF COMPLAINTS/PURPOSE OF CONSULTATION:   Oncology History   # MARCH/APRIL 2017-NEUROENDOCRINE CA STAGE IV; [name]; [name]; [name]; [name]; [name]; [name]; [name]; July 14th  2017 CT- PR  # AUG 28th- FOLFIRI +Avastin; OCT 5th CT scan- PR.   # MARCH 20th- CT STABLE liver lesions; ? Enlarging cecal lesion [CEA rising]; March 26th- FOLFOX [last avastin march 12th 2018];   # resection of primary tumor [Dr.Byrnett]; y90- June 19th 2018.  # July 11th-FOLFOX + Avastin; SEP 2018 [CT- stable liver lesions; increasing ~20mm RP LN]; RISING CEA [stopped end of Sep 2018 ]  # OCT 15th 2018- Vemu+ Iri+ Pan [mid nov stopped iri- neutropenia/dia+vemu- arthralgias]; 05/11/2017-Started enco+Bini+vectibex   # acute lower GIB-EGD/ colonoscopy [Dr.Anna]- - ulcerated Lesion along the anastomotic line;  biopsy negative for recurrence.    # [employer]- B-RAF V600E- MUTATED [May 8466]  # right kidney lesion 2.6 x 1.6 cm ? Angiolipoma [followed by Urology in past]     Malignant neoplasm of ascending colon ([place])     HISTORY OF PRESENTING ILLNESS:  Kathleen James 67 y.o.  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     Patient denies any blood in stools black or stools.  Complains of mild to moderate worsening fatigue.  Denies any shortness of breath at rest denies any  cough.  Denies any nausea vomiting.  Denies any chest pain or shortness of breath or cough.  Mild diarrhea.  ROS: A complete 10 point review of system is done which is negative except mentioned above in history of present illness  MEDICAL HISTORY:  Past Medical History:  Diagnosis Date  . Angiolipoma of kidney    followed with serial CTs ,,  Dr. Jacqlyn Larsen  . Anxiety   . Arthritis   . 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 Chemotherapy induced diarrhea   . Hammer toe   . Hypertension   . Liver cancer ([place])   . Neuro-endocrine carcinoma ([place])   . Obesity (BMI 30-39.9)     SURGICAL HISTORY: Past Surgical History:  Procedure Laterality Date  . ABDOMINAL HYSTERECTOMY  1995   endometriosis, heavy bleeding  . BUNIONECTOMY Bilateral   . CESAREAN SECTION  1985  . COLONOSCOPY WITH PROPOFOL N/A 10/09/2015   Procedure: COLONOSCOPY WITH PROPOFOL;  Surgeon: Robert Bellow, MD;  Location: [facility] ENDOSCOPY;  Service: Endoscopy;  Laterality: N/A;  . COLONOSCOPY WITH PROPOFOL N/A 09/07/2017   Procedure: COLONOSCOPY WITH PROPOFOL;  Surgeon: Jonathon Bellows, MD;  Location: [facility] ENDOSCOPY;  Service: Gastroenterology;  Laterality: N/A;  . ESOPHAGOGASTRODUODENOSCOPY (EGD) WITH PROPOFOL N/A 09/07/2017   Procedure: ESOPHAGOGASTRODUODENOSCOPY (EGD) WITH PROPOFOL;  Surgeon: Jonathon Bellows, MD;  Location: [facility] ENDOSCOPY;  Service: Gastroenterology;  Laterality: N/A;  . IR ANGIOGRAM SELECTIVE EACH ADDITIONAL VESSEL  11/22/2016  . IR ANGIOGRAM SELECTIVE EACH ADDITIONAL VESSEL  11/22/2016  . IR ANGIOGRAM SELECTIVE EACH ADDITIONAL  VESSEL  11/22/2016  . IR ANGIOGRAM SELECTIVE EACH ADDITIONAL VESSEL  12/07/2016  . IR ANGIOGRAM SELECTIVE EACH ADDITIONAL VESSEL  12/07/2016  . IR ANGIOGRAM VISCERAL SELECTIVE  11/22/2016  . IR ANGIOGRAM VISCERAL SELECTIVE  12/07/2016  . IR EMBO ARTERIAL NOT HEMORR HEMANG INC GUIDE ROADMAPPING  11/22/2016  . IR EMBO TUMOR ORGAN ISCHEMIA INFARCT INC  GUIDE ROADMAPPING  12/07/2016  . IR GENERIC HISTORICAL  04/07/2016   IR RADIOLOGIST EVAL & MGMT 04/07/2016 Arne Cleveland, MD [facility] INTERV RAD  . IR RADIOLOGIST EVAL & MGMT  04/14/2017  . IR US GUIDE VASC ACCESS RIGHT  11/22/2016  . IR US GUIDE VASC ACCESS RIGHT  12/07/2016  . LAPAROSCOPIC RIGHT COLECTOMY Right 10/26/2016   Procedure: LAPAROSCOPIC RIGHT COLECTOMY;  Surgeon: Robert Bellow, MD;  Location: [facility] ORS;  Service: General;  Laterality: Right;  . OOPHORECTOMY    . PORTACATH PLACEMENT Left 10/01/2015   Procedure: INSERTION PORT-A-CATH;  Surgeon: Robert Bellow, MD;  Location: [facility] ORS;  Service: General;  Laterality: Left;  . ROTATOR CUFF REPAIR Right 2008   right shoulder.  [place]     SOCIAL HISTORY: She lives at home with her family in [place]. 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 Social History   Socioeconomic History  . Marital status: Married    Spouse name: Not on file  . Number of children: Not on file  . Years of education: Not on file  . Highest education level: Not on file  Occupational History  . Not on file  Social Needs  . Financial resource strain: Not on file  . Food insecurity:    Worry: Not on file    Inability: Not on file  . Transportation needs:    Medical: Not on file    Non-medical: Not on file  Tobacco Use  . Smoking status: Never Smoker  . Smokeless tobacco: Never Used  Substance and Sexual Activity  . Alcohol use: Yes    Alcohol/week: 0.0 oz    Comment: occasionally  . Drug use: No  . Sexual activity: Not Currently  Lifestyle  . Physical activity:    Days per week: Not on file    Minutes per session: Not on file  . Stress: Not on file  Relationships  . Social connections:    Talks on phone: Not on file    Gets together: Not on file    Attends religious service: Not on file    Active member of club or organization: Not on file    Attends meetings of clubs or organizations: Not on file     Relationship status: Not on file  . Intimate partner violence:    Fear of current or ex partner: Not on file    Emotionally abused: Not on file    Physically abused: Not on file    Forced sexual activity: Not on file  Other Topics Concern  . Not on file  Social History Narrative  . Not on file    FAMILY HISTORY: Family History  Problem Relation Age of Onset  . Mental illness Mother 4       bipolar, dementia  . Cancer Maternal Aunt 70       breast ca  . Hypertension Father   . Stroke Father 21       deceased  . COPD Father   . Heart disease Sister        deceased  ALLERGIES:  is allergic to bee venom; dilaudid [hydromorphone hcl]; and morphine and related.  MEDICATIONS:  Current Outpatient Medications  Medication Sig Dispense Refill  . ALPRAZolam (XANAX) 0.25 MG tablet Take 1 tablet (0.25 mg total) by mouth 2 (two) times daily as needed for anxiety. 60 tablet 3  . Binimetinib 15 MG TABS Take 45 mg every 12 (twelve) hours by mouth. (Patient taking differently: Take 30 mg by mouth every 12 (twelve) hours. ) 180 tablet 4  . Biotin w/ Vitamins C & E (HAIR/SKIN/NAILS PO) Take 1 tablet by mouth daily.    . Chlorpheniramine Maleate (CHLOR-TABLETS PO) Take 1 tablet by mouth every evening.    . lidocaine-prilocaine (EMLA) cream Apply 1 application topically as needed. Apply to port a cath site 1 hour prior to chemotherapy treatments 30 g 6  . metoprolol tartrate (LOPRESSOR) 25 MG tablet Take 1 tablet (25 mg total) by mouth 2 (two) times daily. 60 tablet 3  . ondansetron (ZOFRAN) 8 MG tablet Take 1 tablet (8 mg total) by mouth every 8 (eight) hours as needed for nausea or vomiting. 40 tablet 3  . pantoprazole (PROTONIX) 40 MG tablet Take 1 tablet (40 mg total) by mouth daily. 30 tablet 1  . potassium chloride (K-DUR) 10 MEQ tablet TAKE ONE TABLET BY MOUTH TWICE DAILY 60 tablet 3  . prochlorperazine (COMPAZINE) 10 MG tablet TAKE ONE TABLET BY MOUTH EVERY 6 HOURS AS NEEDED FOR  NAUSEA OR VOMITING 30 tablet 2  . triamcinolone ointment (KENALOG) 0.5 % Apply 1 application topically 2 (two) times daily. To rash 30 g 0  . diphenoxylate-atropine (LOMOTIL) 2.5-0.025 MG tablet TAKE 1 TABLET FOUR TIMES DAILY AS NEEDEDFOR DIARRHEA OR LOOSE STOOLS (Patient not taking: Reported on 09/19/2017) 30 tablet 3  . loperamide (IMODIUM A-D) 2 MG tablet Take 2 mg by mouth 4 (four) times daily as needed for diarrhea or loose stools.     No current facility-administered medications for this visit.    Facility-Administered Medications Ordered in Other Visits  Medication Dose Route Frequency Provider Last Rate Last Dose  . heparin lock flush 100 unit/mL  500 Units Intravenous Once Charlaine Dalton R, MD      . sodium chloride flush (NS) 0.9 % injection 10 mL  10 mL Intravenous PRN [name], MD   10 mL at 11/24/15 0836  . sodium chloride flush (NS) 0.9 % injection 10 mL  10 mL Intracatheter PRN [name], MD   10 mL at 09/19/17 1041      .  PHYSICAL EXAMINATION: ECOG PERFORMANCE STATUS: 0 - Asymptomatic  Vitals:   09/19/17 0945  BP: 130/84  Pulse: 77  Resp: 20  Temp: 97.9 F (36.6 C)   There were no vitals filed for this visit.  GENERAL: Well-nourished well-developed; Alert, no distress and comfortable.  She is accompanied by her daughter EYES: no pallor or icterus OROPHARYNX: no thrush or ulceration; good dentition  NECK: supple, no masses felt LYMPH:  no palpable lymphadenopathy in the cervical, axillary or inguinal regions LUNGS: clear to auscultation and  No wheeze or crackles HEART/CVS: regular rate & rhythm and no murmurs; No lower extremity edema ABDOMEN: abdomen soft, non-tender and normal bowel sounds; positive for hepatomegaly. Musculoskeletal:no cyanosis of digits and no clubbing  PSYCH: alert & oriented x 3 with fluent speech NEURO: no focal motor/sensory deficits SKIN:  Acne-like rash noted on the torso/face.  LABORATORY DATA:  I have  reviewed the data as listed Lab Results  Component Value Date   WBC 5.7 09/19/2017   HGB 7.5 (L) 09/19/2017   HCT 22.1 (L) 09/19/2017   MCV 90.3 09/19/2017   PLT 316 09/19/2017   Recent Labs    09/06/17 1125 09/12/17 0910 09/19/17 0939  NA 136 136 135  K 4.7 3.8 4.0  CL 108 106 108  CO2 21* 20* 20*  GLUCOSE 86 92 106*  BUN 25* 16 25*  CREATININE 1.75* 1.34* 1.66*  CALCIUM 8.4* 8.8* 8.5*  GFRNONAA 29* 40* 31*  GFRAA 34* 46* 36*  PROT 6.3* 6.8 6.5  ALBUMIN 2.9* 3.2* 3.1*  AST 26 26 26   ALT 17 18 17   ALKPHOS 91 105 119  BILITOT 0.6 0.5 0.4    RADIOGRAPHIC STUDIES: I have personally reviewed the radiological images as listed and agreed with the findings in the report. No results found.  Results for LILLAH, STANDRE (MRN [id number]) as of 07/04/2017 09:30  Ref. Range 09/23/2015 15:04 11/03/2015 08:34 12/10/2015 08:51 12/24/2015 08:50 01/07/2016 09:07 02/02/2016 09:03 03/01/2016 11:00 03/29/2016 09:06 04/26/2016 10:31 05/24/2016 09:50 06/28/2016 08:32 08/09/2016 09:18 08/30/2016 10:13 09/13/2016 09:35 09/27/2016 09:35 10/11/2016 08:38 10/25/2016 08:19 11/04/2016 10:09 11/17/2016 08:29 12/29/2016 11:32 01/12/2017 09:29 01/26/2017 09:31 02/09/2017 09:02 02/28/2017 09:04 03/14/2017 11:30 03/23/2017 09:40 04/18/2017 10:15 04/25/2017 13:00 05/02/2017 08:25 05/16/2017 08:53 05/25/2017 10:50 05/31/2017 12:20 12020/11/1316 11:52 06/28/2017 14:31  CEA Latest Ref Range: 0.0 - 4.7 ng/mL 538.7 (H) 860.5 (H) 1,167.0 (H) 658.6 (H) 415.6 (H) 242.1 (H) 173.6 (H) 118.8 (H) 100.6 (H) 91.5 (H) 94.5 (H) 141.5 (H) 181.2 (H) 197.3 (H) 215.3 (H) 254.9 (H) 300.7 (H) 254.9 (H) 268.6 (H)                 CEA Latest Ref Range: 0.0 - 4.7 ng/mL                    638.7 (H) 521.8 (H) 398.7 (H) 386.8 (H) 422.2 (H) 529.1 (H) 537.2 (H) 397.9 (H) 263.6 (H) 155.8 (H) 213.0 (H) 132.2 (H) 87.9 (H) 57.0 (H) 164.5 (H)   IMPRESSION: 1. Continued further decrease in size of the dominant posterior right hepatic dome lesion demonstrating no hypermetabolic  FDG accumulation above background parenchymal levels on today's study. No other hypermetabolic liver metastases are evident on the current exam. 2. No hypermetabolic lymphadenopathy in the chest, abdomen, or pelvis to suggest distant hypermetabolic metastatic involvement. The aortocaval lymph node in question on the recent diagnostic CT scan has decreased in size in the interval. 3.  Aortic Atherosclerois (ICD10-170.0)   Electronically Signed   By: Misty Stanley M.D.   On: 07/14/2017 10:36  Results for KABELLA, CASSIDY (MRN [id number]) as of 09/05/2017 10:14  Ref. 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 Jan 24th 2019-PET - improved metastatic lesions to the liver; improved in retroperitoneal lymph nodes 9mm in size. CEA- 92/rising slowly.   # Patient currently on Vectibex [cut dose to 4 mg/kg]; currently on Enco 3 pills/ Bini 2 pills BID.    # acute on chronic anemia-no ongoing acute bleeding.  Today hemoglobin 7.5.  Patient symptomatic.  Recommend PRBC transfusion.  Check  iron studies.  Again reminded the patient to let us know ASAP if she is noticed an increasing bleeding.  #  Lower GI bleed s/p colo [Dr.Anna] ? NSAIDs induced;  Colonoscopy benign ulceration around the anastomosis.  Discussed with Dr. Faith Rogue would be 1 of the options if she starts to bleed again.  Also discussed with Dr. Bary Castilla.  #Hypomagnesemia magnesium 1.7 stable today.; secondary to vectibex;   # Skin rash- sec to vectibex; improved/ clindamycin gel.   # CKD- ? Sec to Bini/Enco- creat 1.6/ stable  # check iron studies/ HOLD tube/  PRBC transfusion tomorrow.   # follow up in 2 weeks/labs/hod tube;CEA;  1 week-cbc/hold tube/possible PRBC transfusion.     [name], MD 09/19/2017 1:29 PM

## 2017-09-19 NOTE — Assessment & Plan Note (Addendum)
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 Jan 24th 2019-PET - improved metastatic lesions to the liver; improved in retroperitoneal lymph nodes 28mm in size. CEA- 92/rising slowly.   # Patient currently on Vectibex [cut dose to 4 mg/kg]; currently on Enco 3 pills/ Bini 2 pills BID.    # acute on chronic anemia-no ongoing acute bleeding.  Today hemoglobin 7.5.  Patient symptomatic.  Recommend PRBC transfusion.  Check iron studies.  Again reminded the patient to let us know ASAP if she is noticed an increasing bleeding.  #  Lower GI bleed s/p colo [Dr.Anna] ? NSAIDs induced;  Colonoscopy benign ulceration around the anastomosis.  Discussed with Dr. Faith Rogue would be 1 of the options if she starts to bleed again.  Also discussed with Dr. Bary Castilla.  #Hypomagnesemia magnesium 1.7 stable today.; secondary to vectibex;   # Skin rash- sec to vectibex; improved/ clindamycin gel.   # CKD- ? Sec to Bini/Enco- creat 1.6/ stable  # check iron studies/ HOLD tube/ PRBC transfusion tomorrow.   # follow up in 2 weeks/labs/hod tube;CEA;  1 week-cbc/hold tube/possible PRBC transfusion.

## 2017-09-20 ENCOUNTER — Inpatient Hospital Stay: Payer: Medicare HMO

## 2017-09-20 DIAGNOSIS — Z5112 Encounter for antineoplastic immunotherapy: Secondary | ICD-10-CM | POA: Diagnosis not present

## 2017-09-20 DIAGNOSIS — D6481 Anemia due to antineoplastic chemotherapy: Secondary | ICD-10-CM

## 2017-09-20 DIAGNOSIS — T451X5A Adverse effect of antineoplastic and immunosuppressive drugs, initial encounter: Principal | ICD-10-CM

## 2017-09-20 LAB — CEA: CEA1: 219.6 ng/mL — AB (ref 0.0–4.7)

## 2017-09-20 MED ORDER — HEPARIN SOD (PORK) LOCK FLUSH 100 UNIT/ML IV SOLN
INTRAVENOUS | Status: AC
  Filled 2017-09-20: qty 5

## 2017-09-20 MED ORDER — SODIUM CHLORIDE 0.9% FLUSH
10.0000 mL | INTRAVENOUS | Status: AC | PRN
  Administered 2017-09-20: 10 mL
  Filled 2017-09-20: qty 10

## 2017-09-20 MED ORDER — HEPARIN SOD (PORK) LOCK FLUSH 100 UNIT/ML IV SOLN
500.0000 [IU] | Freq: Every day | INTRAVENOUS | Status: AC | PRN
  Administered 2017-09-20: 500 [IU]
  Filled 2017-09-20: qty 5

## 2017-09-20 MED ORDER — DIPHENHYDRAMINE HCL 25 MG PO CAPS
25.0000 mg | ORAL_CAPSULE | Freq: Once | ORAL | Status: AC
  Administered 2017-09-20: 25 mg via ORAL
  Filled 2017-09-20: qty 1

## 2017-09-20 MED ORDER — SODIUM CHLORIDE 0.9 % IV SOLN
250.0000 mL | Freq: Once | INTRAVENOUS | Status: AC
  Administered 2017-09-20: 250 mL via INTRAVENOUS
  Filled 2017-09-20: qty 250

## 2017-09-20 MED ORDER — ACETAMINOPHEN 325 MG PO TABS
650.0000 mg | ORAL_TABLET | Freq: Once | ORAL | Status: AC
  Administered 2017-09-20: 650 mg via ORAL
  Filled 2017-09-20: qty 2

## 2017-09-21 ENCOUNTER — Telehealth: Payer: Self-pay | Admitting: Internal Medicine

## 2017-09-21 ENCOUNTER — Encounter: Payer: Self-pay | Admitting: Internal Medicine

## 2017-09-21 LAB — TYPE AND SCREEN
ABO/RH(D): A POS
Antibody Screen: NEGATIVE
Unit division: 0

## 2017-09-21 LAB — BPAM RBC
Blood Product Expiration Date: 201904082359
ISSUE DATE / TIME: 201904020914
UNIT TYPE AND RH: 6200

## 2017-09-21 NOTE — Telephone Encounter (Signed)
Weekly IV venofer x4; start asap. Thx

## 2017-09-21 NOTE — Telephone Encounter (Signed)
Kathleen James, can we schedule the first iron infusion tomorrow? Please contact patient with these apts asap.

## 2017-09-22 ENCOUNTER — Inpatient Hospital Stay: Payer: Medicare HMO

## 2017-09-22 VITALS — BP 137/77 | HR 70 | Resp 20

## 2017-09-22 DIAGNOSIS — C182 Malignant neoplasm of ascending colon: Secondary | ICD-10-CM

## 2017-09-22 DIAGNOSIS — Z5112 Encounter for antineoplastic immunotherapy: Secondary | ICD-10-CM | POA: Diagnosis not present

## 2017-09-22 MED ORDER — IRON SUCROSE 20 MG/ML IV SOLN
200.0000 mg | Freq: Once | INTRAVENOUS | Status: AC
  Administered 2017-09-22: 200 mg via INTRAVENOUS
  Filled 2017-09-22: qty 10

## 2017-09-22 MED ORDER — SODIUM CHLORIDE 0.9 % IV SOLN
Freq: Once | INTRAVENOUS | Status: AC
  Administered 2017-09-22: 14:00:00 via INTRAVENOUS
  Filled 2017-09-22: qty 1000

## 2017-09-26 ENCOUNTER — Inpatient Hospital Stay: Payer: Medicare HMO

## 2017-09-26 DIAGNOSIS — C182 Malignant neoplasm of ascending colon: Secondary | ICD-10-CM

## 2017-09-26 DIAGNOSIS — Z5112 Encounter for antineoplastic immunotherapy: Secondary | ICD-10-CM | POA: Diagnosis not present

## 2017-09-26 LAB — CBC WITH DIFFERENTIAL/PLATELET
BASOS PCT: 1 %
Basophils Absolute: 0 10*3/uL (ref 0–0.1)
EOS ABS: 0.2 10*3/uL (ref 0–0.7)
Eosinophils Relative: 4 %
HCT: 26.3 % — ABNORMAL LOW (ref 35.0–47.0)
HEMOGLOBIN: 8.9 g/dL — AB (ref 12.0–16.0)
Lymphocytes Relative: 14 %
Lymphs Abs: 0.6 10*3/uL — ABNORMAL LOW (ref 1.0–3.6)
MCH: 29.7 pg (ref 26.0–34.0)
MCHC: 33.6 g/dL (ref 32.0–36.0)
MCV: 88.2 fL (ref 80.0–100.0)
MONO ABS: 0.4 10*3/uL (ref 0.2–0.9)
MONOS PCT: 8 %
NEUTROS PCT: 73 %
Neutro Abs: 3.3 10*3/uL (ref 1.4–6.5)
Platelets: 313 10*3/uL (ref 150–440)
RBC: 2.98 MIL/uL — ABNORMAL LOW (ref 3.80–5.20)
RDW: 17.8 % — AB (ref 11.5–14.5)
WBC: 4.6 10*3/uL (ref 3.6–11.0)

## 2017-09-26 MED ORDER — SODIUM CHLORIDE 0.9% FLUSH
10.0000 mL | Freq: Once | INTRAVENOUS | Status: AC
  Administered 2017-09-26: 10 mL via INTRAVENOUS
  Filled 2017-09-26: qty 10

## 2017-09-26 MED ORDER — HEPARIN SOD (PORK) LOCK FLUSH 100 UNIT/ML IV SOLN
500.0000 [IU] | Freq: Once | INTRAVENOUS | Status: AC
  Administered 2017-09-26: 500 [IU] via INTRAVENOUS
  Filled 2017-09-26: qty 5

## 2017-09-26 MED ORDER — DEXTROSE 5 % IV SOLN
INTRAVENOUS | Status: DC
  Filled 2017-09-26: qty 1000

## 2017-09-26 NOTE — Progress Notes (Signed)
Per Nira Conn  RN per Dr. Rogue Bussing no need for blood transfusion at this time. Pt to keep her iron appts. Pt verbalizes understanding and denies any complaints at this time. Pt and VS stable at discharge.

## 2017-09-27 LAB — SAMPLE TO BLOOD BANK

## 2017-09-29 ENCOUNTER — Inpatient Hospital Stay: Payer: Medicare HMO

## 2017-09-29 VITALS — BP 137/85 | HR 72 | Temp 96.8°F | Resp 20

## 2017-09-29 DIAGNOSIS — C182 Malignant neoplasm of ascending colon: Secondary | ICD-10-CM

## 2017-09-29 DIAGNOSIS — Z5112 Encounter for antineoplastic immunotherapy: Secondary | ICD-10-CM | POA: Diagnosis not present

## 2017-09-29 MED ORDER — SODIUM CHLORIDE 0.9 % IV SOLN
Freq: Once | INTRAVENOUS | Status: AC
  Administered 2017-09-29: 14:00:00 via INTRAVENOUS
  Filled 2017-09-29: qty 1000

## 2017-09-29 MED ORDER — IRON SUCROSE 20 MG/ML IV SOLN
200.0000 mg | Freq: Once | INTRAVENOUS | Status: AC
  Administered 2017-09-29: 200 mg via INTRAVENOUS
  Filled 2017-09-29: qty 10

## 2017-09-29 MED ORDER — SODIUM CHLORIDE 0.9% FLUSH
10.0000 mL | INTRAVENOUS | Status: DC | PRN
  Administered 2017-09-29: 10 mL
  Filled 2017-09-29: qty 10

## 2017-09-29 MED ORDER — HEPARIN SOD (PORK) LOCK FLUSH 100 UNIT/ML IV SOLN
500.0000 [IU] | Freq: Once | INTRAVENOUS | Status: AC | PRN
  Administered 2017-09-29: 500 [IU]
  Filled 2017-09-29: qty 5

## 2017-10-03 ENCOUNTER — Inpatient Hospital Stay: Payer: Medicare HMO

## 2017-10-03 ENCOUNTER — Inpatient Hospital Stay (HOSPITAL_BASED_OUTPATIENT_CLINIC_OR_DEPARTMENT_OTHER): Payer: Medicare HMO | Admitting: Internal Medicine

## 2017-10-03 VITALS — BP 134/89 | HR 89 | Temp 98.3°F | Resp 16 | Wt 197.2 lb

## 2017-10-03 DIAGNOSIS — C787 Secondary malignant neoplasm of liver and intrahepatic bile duct: Secondary | ICD-10-CM

## 2017-10-03 DIAGNOSIS — N189 Chronic kidney disease, unspecified: Secondary | ICD-10-CM

## 2017-10-03 DIAGNOSIS — Z8719 Personal history of other diseases of the digestive system: Secondary | ICD-10-CM | POA: Diagnosis not present

## 2017-10-03 DIAGNOSIS — D649 Anemia, unspecified: Secondary | ICD-10-CM | POA: Diagnosis not present

## 2017-10-03 DIAGNOSIS — C182 Malignant neoplasm of ascending colon: Secondary | ICD-10-CM

## 2017-10-03 DIAGNOSIS — Z79899 Other long term (current) drug therapy: Secondary | ICD-10-CM | POA: Diagnosis not present

## 2017-10-03 DIAGNOSIS — R21 Rash and other nonspecific skin eruption: Secondary | ICD-10-CM | POA: Diagnosis not present

## 2017-10-03 DIAGNOSIS — Z5112 Encounter for antineoplastic immunotherapy: Secondary | ICD-10-CM | POA: Diagnosis not present

## 2017-10-03 DIAGNOSIS — Z803 Family history of malignant neoplasm of breast: Secondary | ICD-10-CM

## 2017-10-03 DIAGNOSIS — I129 Hypertensive chronic kidney disease with stage 1 through stage 4 chronic kidney disease, or unspecified chronic kidney disease: Secondary | ICD-10-CM | POA: Diagnosis not present

## 2017-10-03 LAB — COMPREHENSIVE METABOLIC PANEL
ALT: 16 U/L (ref 14–54)
AST: 23 U/L (ref 15–41)
Albumin: 3.2 g/dL — ABNORMAL LOW (ref 3.5–5.0)
Alkaline Phosphatase: 123 U/L (ref 38–126)
Anion gap: 7 (ref 5–15)
BUN: 24 mg/dL — ABNORMAL HIGH (ref 6–20)
CHLORIDE: 109 mmol/L (ref 101–111)
CO2: 21 mmol/L — AB (ref 22–32)
CREATININE: 1.88 mg/dL — AB (ref 0.44–1.00)
Calcium: 8.6 mg/dL — ABNORMAL LOW (ref 8.9–10.3)
GFR calc Af Amer: 31 mL/min — ABNORMAL LOW (ref >60)
GFR, EST NON AFRICAN AMERICAN: 27 mL/min — AB (ref >60)
GLUCOSE: 117 mg/dL — AB (ref 65–99)
Potassium: 3.9 mmol/L (ref 3.5–5.1)
SODIUM: 137 mmol/L (ref 135–145)
Total Bilirubin: 0.3 mg/dL (ref 0.3–1.2)
Total Protein: 6.8 g/dL (ref 6.5–8.1)

## 2017-10-03 LAB — CBC WITH DIFFERENTIAL/PLATELET
Basophils Absolute: 0 10*3/uL (ref 0–0.1)
Basophils Relative: 1 %
EOS ABS: 0.2 10*3/uL (ref 0–0.7)
EOS PCT: 3 %
HCT: 27.3 % — ABNORMAL LOW (ref 35.0–47.0)
Hemoglobin: 9.1 g/dL — ABNORMAL LOW (ref 12.0–16.0)
LYMPHS ABS: 0.7 10*3/uL — AB (ref 1.0–3.6)
Lymphocytes Relative: 12 %
MCH: 29.6 pg (ref 26.0–34.0)
MCHC: 33.4 g/dL (ref 32.0–36.0)
MCV: 88.6 fL (ref 80.0–100.0)
MONO ABS: 0.5 10*3/uL (ref 0.2–0.9)
MONOS PCT: 9 %
Neutro Abs: 4.1 10*3/uL (ref 1.4–6.5)
Neutrophils Relative %: 75 %
PLATELETS: 297 10*3/uL (ref 150–440)
RBC: 3.08 MIL/uL — ABNORMAL LOW (ref 3.80–5.20)
RDW: 18.9 % — ABNORMAL HIGH (ref 11.5–14.5)
WBC: 5.5 10*3/uL (ref 3.6–11.0)

## 2017-10-03 LAB — MAGNESIUM: MAGNESIUM: 1.6 mg/dL — AB (ref 1.7–2.4)

## 2017-10-03 LAB — SAMPLE TO BLOOD BANK

## 2017-10-03 MED ORDER — PANITUMUMAB CHEMO INJECTION 100 MG/5ML
4.0000 mg/kg | Freq: Once | INTRAVENOUS | Status: AC
  Administered 2017-10-03: 360 mg via INTRAVENOUS
  Filled 2017-10-03: qty 18

## 2017-10-03 MED ORDER — HEPARIN SOD (PORK) LOCK FLUSH 100 UNIT/ML IV SOLN
500.0000 [IU] | Freq: Once | INTRAVENOUS | Status: AC | PRN
  Administered 2017-10-03: 500 [IU]

## 2017-10-03 MED ORDER — ONDANSETRON HCL 40 MG/20ML IJ SOLN: Freq: Once | INTRAMUSCULAR | Status: DC

## 2017-10-03 MED ORDER — DEXAMETHASONE SODIUM PHOSPHATE 10 MG/ML IJ SOLN
10.0000 mg | Freq: Once | INTRAMUSCULAR | Status: AC
  Administered 2017-10-03: 10 mg via INTRAVENOUS
  Filled 2017-10-03: qty 1

## 2017-10-03 MED ORDER — SODIUM CHLORIDE 0.9 % IV SOLN
Freq: Once | INTRAVENOUS | Status: AC
  Administered 2017-10-03: 12:00:00 via INTRAVENOUS
  Filled 2017-10-03: qty 1000

## 2017-10-03 MED ORDER — SODIUM CHLORIDE 0.9% FLUSH
10.0000 mL | INTRAVENOUS | Status: DC | PRN
  Administered 2017-10-03: 10 mL via INTRAVENOUS
  Filled 2017-10-03: qty 10

## 2017-10-03 MED ORDER — HEPARIN SOD (PORK) LOCK FLUSH 100 UNIT/ML IV SOLN: 500.0000 [IU] | Freq: Once | INTRAVENOUS | Status: DC

## 2017-10-03 MED ORDER — ONDANSETRON HCL 4 MG/2ML IJ SOLN
8.0000 mg | Freq: Once | INTRAMUSCULAR | Status: AC
  Administered 2017-10-03: 8 mg via INTRAVENOUS
  Filled 2017-10-03: qty 4

## 2017-10-03 NOTE — Assessment & Plan Note (Addendum)
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 Jan 24th 2019-PET - improved metastatic lesions to the liver; improved in retroperitoneal lymph nodes 41mm in size.However- CEA- 250/rising.    # Patient currently on Vectibex [cut dose to 4 mg/kg]; currently on Enco 3 pills/ Bini 2 pills BID.    # acute on chronic anemia-lower GI source/currently resolved.  9.1 s/p IV Iron.  Hemoglobin 9.5.  #Hypomagnesemia magnesium-  secondary to vectibex; 1.6 stable today.  # Skin rash- sec to vectibex; improved/ clindamycin gel.   # CKD- ? Sec to Bini/Enco- creat 1.88/  # follow up on may 1st/labs/hold tube;CEA;Vectibex; PET scan on April 29th.

## 2017-10-03 NOTE — Progress Notes (Signed)
[place] NOTE  [name], [name], MD (Internal Medicine) Bary Castilla, Forest Gleason, [name], RN as Registered Nurse [name], MD as Consulting Physician (Internal Medicine)  CHIEF COMPLAINTS/PURPOSE OF CONSULTATION:   Oncology History   # MARCH/APRIL 2017-NEUROENDOCRINE CA STAGE IV; [name]; [name]; [name]; [name]; [name]; [name]; [name]; July 14th  2017 CT- PR  # AUG 28th- FOLFIRI +Avastin; OCT 5th CT scan- PR.   # MARCH 20th- CT STABLE liver lesions; ? Enlarging cecal lesion [CEA rising]; March 26th- FOLFOX [last avastin march 12th 2018];   # resection of primary tumor [Dr.Byrnett]; y90- June 19th 2018.  # July 11th-FOLFOX + Avastin; SEP 2018 [CT- stable liver lesions; increasing ~1mm RP LN]; RISING CEA [stopped end of Sep 2018 ]  # OCT 15th 2018- Vemu+ Iri+ Pan [mid nov stopped iri- neutropenia/dia+vemu- arthralgias]; 05/11/2017-Started enco+Bini+vectibex   # acute lower GIB-EGD/ colonoscopy [Dr.Anna]- - ulcerated Lesion along the anastomotic line;  biopsy negative for recurrence.    # [facility]- B-RAF V600E- MUTATED [May 1062]  # right kidney lesion 2.6 x 1.6 cm ? Angiolipoma [followed by Urology in past]     Malignant neoplasm of ascending colon ([place])     HISTORY OF PRESENTING ILLNESS:  Kathleen James 67 y.o.  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   Patient denies any swelling in the legs.  Denies any pain.  Skin rash is stable.  No significant diarrhea or fatigue. Denies any shortness of breath at rest  denies any cough.  Denies any nausea vomiting.  Denies any blood in stools.  ROS: A complete 10 point review of system is done which is negative except mentioned above in history of present illness  MEDICAL HISTORY:  Past Medical History:  Diagnosis Date  . Angiolipoma of kidney    followed with serial CTs ,,  Dr. Jacqlyn Larsen  . Anxiety   . Arthritis   . 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 Chemotherapy induced diarrhea   . Hammer toe   . Hypertension   . Liver cancer ([place])   . Neuro-endocrine carcinoma ([place])   . Obesity (BMI 30-39.9)     SURGICAL HISTORY: Past Surgical History:  Procedure Laterality Date  . ABDOMINAL HYSTERECTOMY  1995   endometriosis, heavy bleeding  . BUNIONECTOMY Bilateral   . CESAREAN SECTION  1985  . COLONOSCOPY WITH PROPOFOL N/A 10/09/2015   Procedure: COLONOSCOPY WITH PROPOFOL;  Surgeon: Robert Bellow, MD;  Location: [facility] ENDOSCOPY;  Service: Endoscopy;  Laterality: N/A;  . COLONOSCOPY WITH PROPOFOL N/A 09/07/2017   Procedure: COLONOSCOPY WITH PROPOFOL;  Surgeon: Jonathon Bellows, MD;  Location: [facility] ENDOSCOPY;  Service: Gastroenterology;  Laterality: N/A;  . ESOPHAGOGASTRODUODENOSCOPY (EGD) WITH PROPOFOL N/A 09/07/2017   Procedure: ESOPHAGOGASTRODUODENOSCOPY (EGD) WITH PROPOFOL;  Surgeon: Jonathon Bellows, MD;  Location: [facility] ENDOSCOPY;  Service: Gastroenterology;  Laterality: N/A;  . IR ANGIOGRAM SELECTIVE EACH ADDITIONAL VESSEL  11/22/2016  . IR ANGIOGRAM SELECTIVE EACH ADDITIONAL VESSEL  11/22/2016  . IR ANGIOGRAM SELECTIVE EACH ADDITIONAL VESSEL  11/22/2016  . IR  ANGIOGRAM SELECTIVE EACH ADDITIONAL VESSEL  12/07/2016  . IR ANGIOGRAM SELECTIVE EACH ADDITIONAL VESSEL  12/07/2016  . IR ANGIOGRAM VISCERAL SELECTIVE  11/22/2016  . IR ANGIOGRAM VISCERAL SELECTIVE  12/07/2016  . IR EMBO ARTERIAL NOT HEMORR HEMANG INC GUIDE ROADMAPPING  11/22/2016  . IR EMBO TUMOR ORGAN ISCHEMIA INFARCT INC GUIDE ROADMAPPING  12/07/2016  .  IR GENERIC HISTORICAL  04/07/2016   IR RADIOLOGIST EVAL & MGMT 04/07/2016 Arne Cleveland, MD [facility] INTERV RAD  . IR RADIOLOGIST EVAL & MGMT  04/14/2017  . IR US GUIDE VASC ACCESS RIGHT  11/22/2016  . IR US GUIDE VASC ACCESS RIGHT  12/07/2016  . LAPAROSCOPIC RIGHT COLECTOMY Right 10/26/2016   Procedure: LAPAROSCOPIC RIGHT COLECTOMY;  Surgeon: Robert Bellow, MD;  Location: [facility] ORS;  Service: General;  Laterality: Right;  . OOPHORECTOMY    . PORTACATH PLACEMENT Left 10/01/2015   Procedure: INSERTION PORT-A-CATH;  Surgeon: Robert Bellow, MD;  Location: [facility] ORS;  Service: General;  Laterality: Left;  . ROTATOR CUFF REPAIR Right 2008   right shoulder.  [place]     SOCIAL HISTORY: She lives at home with her family in [place]. 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 Social History   Socioeconomic History  . Marital status: Married    Spouse name: Not on file  . Number of children: Not on file  . Years of education: Not on file  . Highest education level: Not on file  Occupational History  . Not on file  Social Needs  . Financial resource strain: Not on file  . Food insecurity:    Worry: Not on file    Inability: Not on file  . Transportation needs:    Medical: Not on file    Non-medical: Not on file  Tobacco Use  . Smoking status: Never Smoker  . Smokeless tobacco: Never Used  Substance and Sexual Activity  . Alcohol use: Yes    Alcohol/week: 0.0 oz    Comment: occasionally  . Drug use: No  . Sexual activity: Not Currently  Lifestyle  . Physical activity:    Days per week: Not on file    Minutes per session: Not on file  . Stress: Not on file  Relationships  . Social connections:    Talks on phone: Not on file    Gets together: Not on file    Attends religious service: Not on file    Active member of club or organization: Not on file    Attends meetings of clubs or organizations: Not on file    Relationship status: Not on  file  . Intimate partner violence:    Fear of current or ex partner: Not on file    Emotionally abused: Not on file    Physically abused: Not on file    Forced sexual activity: Not on file  Other Topics Concern  . Not on file  Social History Narrative  . Not on file    FAMILY HISTORY: Family History  Problem Relation Age of Onset  . Mental illness Mother 58       bipolar, dementia  . Cancer Maternal Aunt 70       breast ca  . Hypertension Father   . Stroke Father 84       deceased  . COPD Father   . Heart disease Sister        deceased    ALLERGIES:  is allergic to  bee venom; dilaudid [hydromorphone hcl]; and morphine and related.  MEDICATIONS:  Current Outpatient Medications  Medication Sig Dispense Refill  . ALPRAZolam (XANAX) 0.25 MG tablet Take 1 tablet (0.25 mg total) by mouth 2 (two) times daily as needed for anxiety. 60 tablet 3  . Binimetinib 15 MG TABS Take 45 mg every 12 (twelve) hours by mouth. (Patient taking differently: Take 30 mg by mouth every 12 (twelve) hours. ) 180 tablet 4  . Biotin w/ Vitamins C & E (HAIR/SKIN/NAILS PO) Take 1 tablet by mouth daily.    . Chlorpheniramine Maleate (CHLOR-TABLETS PO) Take 1 tablet by mouth every evening.    . diphenoxylate-atropine (LOMOTIL) 2.5-0.025 MG tablet TAKE 1 TABLET FOUR TIMES DAILY AS NEEDEDFOR DIARRHEA OR LOOSE STOOLS 30 tablet 3  . lidocaine-prilocaine (EMLA) cream Apply 1 application topically as needed. Apply to port a cath site 1 hour prior to chemotherapy treatments 30 g 6  . loperamide (IMODIUM A-D) 2 MG tablet Take 2 mg by mouth 4 (four) times daily as needed for diarrhea or loose stools.    . metoprolol tartrate (LOPRESSOR) 25 MG tablet Take 1 tablet (25 mg total) by mouth 2 (two) times daily. 60 tablet 3  . ondansetron (ZOFRAN) 8 MG tablet Take 1 tablet (8 mg total) by mouth every 8 (eight) hours as needed for nausea or vomiting. 40 tablet 3  . pantoprazole (PROTONIX) 40 MG tablet Take 1 tablet (40 mg  total) by mouth daily. 30 tablet 1  . potassium chloride (K-DUR) 10 MEQ tablet TAKE ONE TABLET BY MOUTH TWICE DAILY 60 tablet 3  . prochlorperazine (COMPAZINE) 10 MG tablet TAKE ONE TABLET BY MOUTH EVERY 6 HOURS AS NEEDED FOR NAUSEA OR VOMITING 30 tablet 2  . triamcinolone ointment (KENALOG) 0.5 % Apply 1 application topically 2 (two) times daily. To rash 30 g 0   No current facility-administered medications for this visit.    Facility-Administered Medications Ordered in Other Visits  Medication Dose Route Frequency Provider Last Rate Last Dose  . heparin lock flush 100 unit/mL  500 Units Intravenous Once Charlaine Dalton R, MD      . sodium chloride flush (NS) 0.9 % injection 10 mL  10 mL Intravenous PRN [name], MD   10 mL at 11/24/15 0836      .  PHYSICAL EXAMINATION: ECOG PERFORMANCE STATUS: 0 - Asymptomatic  Vitals:   10/03/17 1037  BP: 134/89  Pulse: 89  Resp: 16  Temp: 98.3 F (36.8 C)   Filed Weights   10/03/17 1037  Weight: 197 lb 3.2 oz (89.4 kg)    GENERAL: Well-nourished well-developed; Alert, no distress and comfortable.  She is accompanied by her daughter EYES: no pallor or icterus OROPHARYNX: no thrush or ulceration; good dentition  NECK: supple, no masses felt LYMPH:  no palpable lymphadenopathy in the cervical, axillary or inguinal regions LUNGS: clear to auscultation and  No wheeze or crackles HEART/CVS: regular rate & rhythm and no murmurs; No lower extremity edema ABDOMEN: abdomen soft, non-tender and normal bowel sounds; positive for hepatomegaly. Musculoskeletal:no cyanosis of digits and no clubbing  PSYCH: alert & oriented x 3 with fluent speech NEURO: no focal motor/sensory deficits SKIN:  Acne-like rash noted on the torso/face.  LABORATORY DATA:  I have reviewed the data as listed Lab Results  Component Value Date   WBC 5.5 10/03/2017   HGB 9.1 (L) 10/03/2017   HCT 27.3 (L) 10/03/2017   MCV 88.6 10/03/2017   PLT 297  10/03/2017  Recent Labs    09/12/17 0910 09/19/17 0939 10/03/17 0949  NA 136 135 137  K 3.8 4.0 3.9  CL 106 108 109  CO2 20* 20* 21*  GLUCOSE 92 106* 117*  BUN 16 25* 24*  CREATININE 1.34* 1.66* 1.88*  CALCIUM 8.8* 8.5* 8.6*  GFRNONAA 40* 31* 27*  GFRAA 46* 36* 31*  PROT 6.8 6.5 6.8  ALBUMIN 3.2* 3.1* 3.2*  AST 26 26 23   ALT 18 17 16   ALKPHOS 105 119 123  BILITOT 0.5 0.4 0.3    RADIOGRAPHIC STUDIES: I have personally reviewed the radiological images as listed and agreed with the findings in the report. No results found.  Results for Kathleen, James (MRN [id number]) as of 10/03/2017 11:16  Ref. Range 12020/09/2416 11:52 06/28/2017 14:31 07/04/2017 09:09 07/18/2017 08:04 08/01/2017 08:20 08/15/2017 09:09 09/05/2017 09:20 09/19/2017 09:39  CEA Latest Ref Range: 0.0 - 4.7 ng/mL 57.0 (H) 164.5 (H) 224.0 (H) 98.3 (H) 49.3 (H) 57.3 (H) 92.9 (H) 219.6 (H)    IMPRESSION: 1. Continued further decrease in size of the dominant posterior right hepatic dome lesion demonstrating no hypermetabolic FDG accumulation above background parenchymal levels on today's study. No other hypermetabolic liver metastases are evident on the current exam. 2. No hypermetabolic lymphadenopathy in the chest, abdomen, or pelvis to suggest distant hypermetabolic metastatic involvement. The aortocaval lymph node in question on the recent diagnostic CT scan has decreased in size in the interval. 3.  Aortic Atherosclerois (ICD10-170.0)   Electronically Signed   By: Misty Stanley M.D.   On: 07/14/2017 10:36  Results for Kathleen, James (MRN [id number]) as of 10/04/2017 08:07  Ref. 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 Jan 24th 2019-PET - improved metastatic lesions to the liver; improved in retroperitoneal lymph nodes 31mm in size.However- CEA- 250/rising.    # Patient currently on Vectibex [cut dose to 4 mg/kg]; currently on Enco 3 pills/ Bini 2 pills BID.    # acute on chronic anemia-lower GI source/currently resolved.  9.1 s/p IV Iron.  Hemoglobin 9.5.  #Hypomagnesemia magnesium-  secondary to vectibex; 1.6 stable today.  # Skin rash- sec to vectibex; improved/ clindamycin gel.   # CKD- ? Sec to Bini/Enco- creat 1.88/  # follow up on may 1st/labs/hold tube;CEA;Vectibex; PET scan on April 29th.     [name], MD 10/04/2017 8:09 AM

## 2017-10-04 ENCOUNTER — Telehealth: Payer: Self-pay | Admitting: Internal Medicine

## 2017-10-04 LAB — CEA: CEA1: 238.4 ng/mL — AB (ref 0.0–4.7)

## 2017-10-04 NOTE — Telephone Encounter (Signed)
Please schedule patient for Venofer when patient comes for treatment on May 1st along withy vectibex- Thx

## 2017-10-06 ENCOUNTER — Inpatient Hospital Stay: Payer: Medicare HMO

## 2017-10-06 VITALS — BP 117/72 | HR 74 | Temp 97.4°F | Resp 18

## 2017-10-06 DIAGNOSIS — C182 Malignant neoplasm of ascending colon: Secondary | ICD-10-CM

## 2017-10-06 DIAGNOSIS — Z5112 Encounter for antineoplastic immunotherapy: Secondary | ICD-10-CM | POA: Diagnosis not present

## 2017-10-06 MED ORDER — HEPARIN SOD (PORK) LOCK FLUSH 100 UNIT/ML IV SOLN
500.0000 [IU] | Freq: Once | INTRAVENOUS | Status: AC | PRN
  Administered 2017-10-06: 500 [IU]

## 2017-10-06 MED ORDER — IRON SUCROSE 20 MG/ML IV SOLN
200.0000 mg | Freq: Once | INTRAVENOUS | Status: AC
  Administered 2017-10-06: 200 mg via INTRAVENOUS
  Filled 2017-10-06: qty 10

## 2017-10-06 MED ORDER — SODIUM CHLORIDE 0.9 % IV SOLN
Freq: Once | INTRAVENOUS | Status: AC
  Administered 2017-10-06: 14:00:00 via INTRAVENOUS
  Filled 2017-10-06: qty 1000

## 2017-10-13 ENCOUNTER — Ambulatory Visit: Payer: Self-pay

## 2017-10-17 ENCOUNTER — Encounter
Admission: RE | Admit: 2017-10-17 | Discharge: 2017-10-17 | Disposition: A | Discharge status: Home / Self Care | Payer: Medicare HMO | Source: Ambulatory Visit | Attending: Internal Medicine | Admitting: Internal Medicine

## 2017-10-17 DIAGNOSIS — C182 Malignant neoplasm of ascending colon: Secondary | ICD-10-CM | POA: Diagnosis not present

## 2017-10-17 LAB — GLUCOSE, CAPILLARY: GLUCOSE-CAPILLARY: 78 mg/dL (ref 65–99)

## 2017-10-17 MED ORDER — FLUDEOXYGLUCOSE F - 18 (FDG) INJECTION
10.5800 | Freq: Once | INTRAVENOUS | Status: AC | PRN
  Administered 2017-10-17: 10.58 via INTRAVENOUS

## 2017-10-19 ENCOUNTER — Telehealth: Payer: Self-pay | Admitting: *Deleted

## 2017-10-19 ENCOUNTER — Encounter: Payer: Self-pay | Admitting: Internal Medicine

## 2017-10-19 ENCOUNTER — Inpatient Hospital Stay (HOSPITAL_BASED_OUTPATIENT_CLINIC_OR_DEPARTMENT_OTHER): Payer: Medicare HMO | Admitting: Internal Medicine

## 2017-10-19 ENCOUNTER — Inpatient Hospital Stay: Payer: Medicare HMO

## 2017-10-19 ENCOUNTER — Telehealth: Payer: Self-pay | Admitting: Pharmacist

## 2017-10-19 ENCOUNTER — Telehealth: Payer: Self-pay | Admitting: Internal Medicine

## 2017-10-19 ENCOUNTER — Inpatient Hospital Stay: Payer: Medicare HMO | Attending: Internal Medicine

## 2017-10-19 ENCOUNTER — Telehealth: Payer: Self-pay | Admitting: Pharmacy Technician

## 2017-10-19 VITALS — BP 124/76 | Temp 96.4°F | Resp 16 | Wt 198.4 lb

## 2017-10-19 DIAGNOSIS — N189 Chronic kidney disease, unspecified: Secondary | ICD-10-CM

## 2017-10-19 DIAGNOSIS — R5381 Other malaise: Secondary | ICD-10-CM | POA: Insufficient documentation

## 2017-10-19 DIAGNOSIS — I129 Hypertensive chronic kidney disease with stage 1 through stage 4 chronic kidney disease, or unspecified chronic kidney disease: Secondary | ICD-10-CM | POA: Diagnosis not present

## 2017-10-19 DIAGNOSIS — R160 Hepatomegaly, not elsewhere classified: Secondary | ICD-10-CM | POA: Insufficient documentation

## 2017-10-19 DIAGNOSIS — C182 Malignant neoplasm of ascending colon: Secondary | ICD-10-CM | POA: Diagnosis not present

## 2017-10-19 DIAGNOSIS — Z79899 Other long term (current) drug therapy: Secondary | ICD-10-CM | POA: Insufficient documentation

## 2017-10-19 DIAGNOSIS — R110 Nausea: Secondary | ICD-10-CM | POA: Diagnosis not present

## 2017-10-19 DIAGNOSIS — M1990 Unspecified osteoarthritis, unspecified site: Secondary | ICD-10-CM

## 2017-10-19 DIAGNOSIS — C772 Secondary and unspecified malignant neoplasm of intra-abdominal lymph nodes: Secondary | ICD-10-CM

## 2017-10-19 DIAGNOSIS — D649 Anemia, unspecified: Secondary | ICD-10-CM | POA: Insufficient documentation

## 2017-10-19 DIAGNOSIS — R5383 Other fatigue: Secondary | ICD-10-CM | POA: Insufficient documentation

## 2017-10-19 DIAGNOSIS — C787 Secondary malignant neoplasm of liver and intrahepatic bile duct: Secondary | ICD-10-CM | POA: Diagnosis not present

## 2017-10-19 DIAGNOSIS — E669 Obesity, unspecified: Secondary | ICD-10-CM

## 2017-10-19 DIAGNOSIS — N183 Chronic kidney disease, stage 3 (moderate): Secondary | ICD-10-CM | POA: Insufficient documentation

## 2017-10-19 LAB — COMPREHENSIVE METABOLIC PANEL
ALK PHOS: 141 U/L — AB (ref 38–126)
ALT: 18 U/L (ref 14–54)
ANION GAP: 8 (ref 5–15)
AST: 27 U/L (ref 15–41)
Albumin: 3.1 g/dL — ABNORMAL LOW (ref 3.5–5.0)
BUN: 20 mg/dL (ref 6–20)
CALCIUM: 8.7 mg/dL — AB (ref 8.9–10.3)
CO2: 20 mmol/L — AB (ref 22–32)
CREATININE: 1.7 mg/dL — AB (ref 0.44–1.00)
Chloride: 110 mmol/L (ref 101–111)
GFR, EST AFRICAN AMERICAN: 35 mL/min — AB (ref >60)
GFR, EST NON AFRICAN AMERICAN: 30 mL/min — AB (ref >60)
Glucose, Bld: 88 mg/dL (ref 65–99)
Potassium: 4.1 mmol/L (ref 3.5–5.1)
SODIUM: 138 mmol/L (ref 135–145)
TOTAL PROTEIN: 6.6 g/dL (ref 6.5–8.1)
Total Bilirubin: 0.5 mg/dL (ref 0.3–1.2)

## 2017-10-19 LAB — CBC WITH DIFFERENTIAL/PLATELET
BASOS ABS: 0 10*3/uL (ref 0–0.1)
BASOS PCT: 1 %
EOS ABS: 0.2 10*3/uL (ref 0–0.7)
Eosinophils Relative: 3 %
HEMATOCRIT: 28.9 % — AB (ref 35.0–47.0)
HEMOGLOBIN: 9.7 g/dL — AB (ref 12.0–16.0)
Lymphocytes Relative: 13 %
Lymphs Abs: 0.7 10*3/uL — ABNORMAL LOW (ref 1.0–3.6)
MCH: 30.6 pg (ref 26.0–34.0)
MCHC: 33.6 g/dL (ref 32.0–36.0)
MCV: 91.2 fL (ref 80.0–100.0)
MONOS PCT: 8 %
Monocytes Absolute: 0.4 10*3/uL (ref 0.2–0.9)
NEUTROS PCT: 75 %
Neutro Abs: 3.7 10*3/uL (ref 1.4–6.5)
Platelets: 261 10*3/uL (ref 150–440)
RBC: 3.17 MIL/uL — AB (ref 3.80–5.20)
RDW: 20.4 % — ABNORMAL HIGH (ref 11.5–14.5)
WBC: 5 10*3/uL (ref 3.6–11.0)

## 2017-10-19 LAB — MAGNESIUM: MAGNESIUM: 1.6 mg/dL — AB (ref 1.7–2.4)

## 2017-10-19 MED ORDER — HEPARIN SOD (PORK) LOCK FLUSH 100 UNIT/ML IV SOLN
500.0000 [IU] | Freq: Once | INTRAVENOUS | Status: AC
  Administered 2017-10-19: 500 [IU] via INTRAVENOUS
  Filled 2017-10-19: qty 5

## 2017-10-19 MED ORDER — TRIFLURIDINE-TIPIRACIL 20-8.19 MG PO TABS: ORAL_TABLET | ORAL | 4 refills | Status: DC

## 2017-10-19 MED ORDER — TRIFLURIDINE-TIPIRACIL 15-6.14 MG PO TABS: ORAL_TABLET | ORAL | 4 refills | Status: DC

## 2017-10-19 MED ORDER — SODIUM CHLORIDE 0.9% FLUSH
10.0000 mL | Freq: Once | INTRAVENOUS | Status: AC
  Administered 2017-10-19: 10 mL via INTRAVENOUS
  Filled 2017-10-19: qty 10

## 2017-10-19 MED ORDER — TRIFLURIDINE-TIPIRACIL 20-8.19 MG PO TABS: 80.0000 mg | ORAL_TABLET | Freq: Two times a day (BID) | ORAL | 4 refills | Status: DC

## 2017-10-19 NOTE — Telephone Encounter (Signed)
Oral Chemotherapy Pharmacist Encounter   Called [employer] the manufacturer assistance program providing Mektovi/Braftovi to Ms. Mckibbin. I informed them that she is no longer taking Mektovi/Braftovi.   Darl Pikes, PharmD, BCPS Hematology/Oncology Clinical Pharmacist [facility] [phone]  10/19/2017 1:52 PM

## 2017-10-19 NOTE — Assessment & Plan Note (Addendum)
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 # Patient currently on Vectibex / Enco 3 pills/ Bini 2 pills BID.   #Unfortunately, APRIL 24th 2019-PET -Worsening metastatic lesions in the liver/ worsening retroperitoneal lymph nodes; also discussed with Dr. Rosario Jacks from radiology.  CEA- 250/rising.    # Recommend discontinuation of this therapy given progression.Recommend Lonsurf as the next option.Discussed the dosing/schedule potential side effects including but not limited to neutropenia sores in the mouth etc. understands that treatments are palliative.  Repeat IR radiation could be considered to the liver; however that would not treat the retroperitoneal adenopathy.  Discussed with Ebony Hail chemo pharmacist.  Marcelina Morel discussed regarding clinical trials phase 1 at [facility]; will plan Lonsurf for now.   # acute on chronic anemia-lower GI source/currently resolved.  s/p IV Iron.  Hemoglobin 9.7.   #Hypomagnesemia magnesium-  secondary to vectibex; 1.6 stable today.   # CKD- ? Sec to Bini/Enco- creat 1.7; [no dosage adjustment on lonsurf with GFR > 30]  # STOP enco/Bini; NO vecntibex today; de-access; follow up in 3 weeks/labs.  Addendum: Discussed with the patient daughter over the phone Erin-regarding the above plan; also discussed regarding clinical trial options/and also evaluation at tertiary care centers like [facility].  Daughter states that she will talk to her mother/and family and let us know-then will initiate a referral.   # I reviewed the blood work- with the patient in detail; also reviewed the imaging independently [as summarized above]; and with the patient in detail.

## 2017-10-19 NOTE — Telephone Encounter (Signed)
Oral Oncology Pharmacist Encounter  Received new prescription for Lonsurf for the treatment of metastatic colon cancer, planned duration until disease progression or unacceptable drug toxicity.  CBC from 10/19/17 assessed, no relevant lab abnormalities. Prescription dose and frequency assessed.   Current medication list in Epic reviewed, no DDIs with Lonsurf identified.  Prescription has been e-scribed to the [facility] for benefits analysis and approval.  Patient education Counseled patient on administration, dosing, side effects, monitoring, drug-food interactions, safe handling, storage, and disposal. Patient will take two 15-6.14 mg and two 20-8.19 mg tablets of Lonsurf by mouth twice daily, 1 hr after AM & PM meals on days 1-5, 8-12. Repeat every 28d.  Side effects include but not limited to: neutropenia, decreased hgb/WBC/plt, N/V, fatigue, diarrhea.    Reviewed with patient importance of keeping a medication schedule and plan for any missed doses.  Ms. Slape voiced understanding and appreciation. All questions answered. Medication handout was given to patient and consent was obtained.  Oral Oncology Clinic will continue to follow for insurance authorization, copayment issues, and start date.  Provided patient with [facility] [place] [facility] phone number. Patient knows to call the office with questions or concerns. Oral Chemotherapy Navigation Clinic will continue to follow.  Darl Pikes, PharmD, BCPS Hematology/Oncology Clinical Pharmacist [facility] [phone]  10/19/2017 12:06 PM

## 2017-10-19 NOTE — Progress Notes (Signed)
No treatment today per Dr. Brahmanday 

## 2017-10-19 NOTE — Telephone Encounter (Signed)
Call attempt by Dr.Brahmanday - 1500- no answer. md left vm for daughter to return his call

## 2017-10-19 NOTE — Progress Notes (Signed)
[place] NOTE  [name], [name], MD (Internal Medicine) Bary Castilla, Forest Gleason, [name], RN as Registered Nurse [name], MD as Consulting Physician (Internal Medicine)  CHIEF COMPLAINTS/PURPOSE OF CONSULTATION:   Oncology History   # MARCH/APRIL 2017-NEUROENDOCRINE CA STAGE IV; [name]; [name]; [name]; [name]; [name]; [name]; [name]; July 14th  2017 CT- PR  # AUG 28th- FOLFIRI +Avastin; OCT 5th CT scan- PR.   # MARCH 20th- CT STABLE liver lesions; ? Enlarging cecal lesion [CEA rising]; March 26th- FOLFOX [last avastin march 12th 2018];   # resection of primary tumor [Dr.Byrnett]; y90- June 19th 2018.  # July 11th-FOLFOX + Avastin; SEP 2018 [CT- stable liver lesions; increasing ~85mm RP LN]; RISING CEA [stopped end of Sep 2018 ]  # OCT 15th 2018- Vemu+ Iri+ Pan [mid nov stopped iri- neutropenia/dia+vemu- arthralgias]; 05/11/2017-Started enco+Bini+vectibex; April 28th PET- PROGRESSION  #    # acute lower GIB-EGD/ colonoscopy [Dr.Anna]- - ulcerated Lesion along the anastomotic line;  biopsy negative for recurrence.    # [facility]- B-RAF V600E- MUTATED [May 7616]  # right kidney lesion 2.6 x 1.6 cm ? Angiolipoma [followed by Urology in past]     Malignant neoplasm of ascending colon ([place])     HISTORY OF PRESENTING ILLNESS:  Kathleen James 67 y.o.  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  Patient had a good trip to the beach.  Denies any worsening skin rash.    Patient has chronic mild diarrhea.  Chronic mild rash.  This is not getting any worse.  Denies abdominal pain.  Denies any loss of appetite no chest pain or cough.  Denies any nausea vomiting.  Denies any blood in stools.  ROS: A complete 10 point review of system is done which is negative except mentioned above in history of present illness  MEDICAL HISTORY:  Past Medical History:  Diagnosis Date  . Angiolipoma of kidney    followed with serial CTs ,,  Dr. Jacqlyn Larsen  . Anxiety   . Arthritis   . 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 Chemotherapy induced diarrhea   . Hammer toe   . Hypertension   . Liver cancer ([place])   . Neuro-endocrine carcinoma ([place])   . Obesity (BMI 30-39.9)     SURGICAL HISTORY: Past Surgical History:  Procedure Laterality Date  . ABDOMINAL HYSTERECTOMY  1995   endometriosis, heavy bleeding  . BUNIONECTOMY Bilateral   . CESAREAN SECTION  1985  . COLONOSCOPY WITH PROPOFOL N/A 10/09/2015   Procedure: COLONOSCOPY WITH PROPOFOL;  Surgeon: Robert Bellow, MD;  Location[facility] END[facility]COPY;  Service: Endoscopy;  Laterality: N/A;  . COLONOSCOPY WITH PROPOFOL N/A 09/07/2017   Procedure: COLONOSCOPY WITH PROPOFOL;  Surgeon: Jonathon Bellows, MD;  Location: [facility] ENDOSCOPY;  Service: Gastroenterology;  Laterality: N/A;  . ESOPHAGOGASTRODUODENOSCOPY (EGD) WITH PROPOFOL N/A 09/07/2017   Procedure: ESOPHAGOGASTRODUODENOSCOPY (EGD) WITH PROPOFOL;  Surgeon: Jonathon Bellows, MD;  Location: [facility] ENDOSCOPY;  Service: Gastroenterology;  Laterality: N/A;  . IR ANGIOGRAM SELECTIVE  EACH ADDITIONAL VESSEL  11/22/2016  . IR ANGIOGRAM SELECTIVE EACH ADDITIONAL VESSEL  11/22/2016  . IR ANGIOGRAM SELECTIVE EACH ADDITIONAL VESSEL  11/22/2016  . IR ANGIOGRAM SELECTIVE EACH ADDITIONAL VESSEL  12/07/2016  . IR ANGIOGRAM SELECTIVE EACH ADDITIONAL VESSEL  12/07/2016  . IR ANGIOGRAM VISCERAL SELECTIVE  11/22/2016  . IR ANGIOGRAM VISCERAL SELECTIVE  12/07/2016   . IR EMBO ARTERIAL NOT HEMORR HEMANG INC GUIDE ROADMAPPING  11/22/2016  . IR EMBO TUMOR ORGAN ISCHEMIA INFARCT INC GUIDE ROADMAPPING  12/07/2016  . IR GENERIC HISTORICAL  04/07/2016   IR RADIOLOGIST EVAL & MGMT 04/07/2016 Arne Cleveland, MD [facility] INTERV RAD  . IR RADIOLOGIST EVAL & MGMT  04/14/2017  . IR US GUIDE VASC ACCESS RIGHT  11/22/2016  . IR US GUIDE VASC ACCESS RIGHT  12/07/2016  . LAPAROSCOPIC RIGHT COLECTOMY Right 10/26/2016   Procedure: LAPAROSCOPIC RIGHT COLECTOMY;  Surgeon: Robert Bellow, MD;  Location: [facility] ORS;  Service: General;  Laterality: Right;  . OOPHORECTOMY    . PORTACATH PLACEMENT Left 10/01/2015   Procedure: INSERTION PORT-A-CATH;  Surgeon: Robert Bellow, MD;  Location: [facility] ORS;  Service: General;  Laterality: Left;  . ROTATOR CUFF REPAIR Right 2008   right shoulder.  [place]     SOCIAL HISTORY: She lives at home with her family in [place]. 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 Social History   Socioeconomic History  . Marital status: Married    Spouse name: Not on file  . Number of children: Not on file  . Years of education: Not on file  . Highest education level: Not on file  Occupational History  . Not on file  Social Needs  . Financial resource strain: Not on file  . Food insecurity:    Worry: Not on file    Inability: Not on file  . Transportation needs:    Medical: Not on file    Non-medical: Not on file  Tobacco Use  . Smoking status: Never Smoker  . Smokeless tobacco: Never Used  Substance and Sexual Activity  . Alcohol use: Yes    Alcohol/week: 0.0 oz    Comment: occasionally  . Drug use: No  . Sexual activity: Not Currently  Lifestyle  . Physical activity:    Days per week: Not on file    Minutes per session: Not on file  . Stress: Not on file  Relationships  . Social connections:    Talks on phone: Not on file    Gets together: Not on file    Attends religious service: Not on  file    Active member of club or organization: Not on file    Attends meetings of clubs or organizations: Not on file    Relationship status: Not on file  . Intimate partner violence:    Fear of current or ex partner: Not on file    Emotionally abused: Not on file    Physically abused: Not on file    Forced sexual activity: Not on file  Other Topics Concern  . Not on file  Social History Narrative  . Not on file    FAMILY HISTORY: Family History  Problem Relation Age of Onset  . Mental illness Mother 18       bipolar, dementia  . Cancer Maternal Aunt 70       breast ca  . Hypertension Father   . Stroke Father 63  deceased  . COPD Father   . Heart disease Sister        deceased    ALLERGIES:  is allergic to bee venom; dilaudid [hydromorphone hcl]; and morphine and related.  MEDICATIONS:  Current Outpatient Medications  Medication Sig Dispense Refill  . ALPRAZolam (XANAX) 0.25 MG tablet Take 1 tablet (0.25 mg total) by mouth 2 (two) times daily as needed for anxiety. 60 tablet 3  . Biotin w/ Vitamins C & E (HAIR/SKIN/NAILS PO) Take 1 tablet by mouth daily.    . Chlorpheniramine Maleate (CHLOR-TABLETS PO) Take 1 tablet by mouth every evening.    . diphenoxylate-atropine (LOMOTIL) 2.5-0.025 MG tablet TAKE 1 TABLET FOUR TIMES DAILY AS NEEDEDFOR DIARRHEA OR LOOSE STOOLS 30 tablet 3  . lidocaine-prilocaine (EMLA) cream Apply 1 application topically as needed. Apply to port a cath site 1 hour prior to chemotherapy treatments 30 g 6  . loperamide (IMODIUM A-D) 2 MG tablet Take 2 mg by mouth 4 (four) times daily as needed for diarrhea or loose stools.    . metoprolol tartrate (LOPRESSOR) 25 MG tablet Take 1 tablet (25 mg total) by mouth 2 (two) times daily. 60 tablet 3  . ondansetron (ZOFRAN) 8 MG tablet Take 1 tablet (8 mg total) by mouth every 8 (eight) hours as needed for nausea or vomiting. 40 tablet 3  . pantoprazole (PROTONIX) 40 MG tablet Take 1 tablet (40 mg total) by  mouth daily. 30 tablet 1  . potassium chloride (K-DUR) 10 MEQ tablet TAKE ONE TABLET BY MOUTH TWICE DAILY 60 tablet 3  . prochlorperazine (COMPAZINE) 10 MG tablet TAKE ONE TABLET BY MOUTH EVERY 6 HOURS AS NEEDED FOR NAUSEA OR VOMITING 30 tablet 2  . triamcinolone ointment (KENALOG) 0.5 % Apply 1 application topically 2 (two) times daily. To rash 30 g 0  . trifluridine-tipiracil (LONSURF) 15-6.14 MG tablet Take 2 tablets by mouth twice daily, 1 hr after AM & PM meals on days 1-5, 8-12. Take with two Lonsurf 20-8.19 mg tablets. Repeat every 28d 40 tablet 4  . trifluridine-tipiracil (LONSURF) 20-8.19 MG tablet Take 2 tablets by mouth twice daily, 1 hr after AM & PM meals on days 1-5, 8-12. Take with two Lonsurf 15-6.14 mg tablets. Repeat every 28d 40 tablet 4   No current facility-administered medications for this visit.    Facility-Administered Medications Ordered in Other Visits  Medication Dose Route Frequency Provider Last Rate Last Dose  . heparin lock flush 100 unit/mL  500 Units Intravenous Once Charlaine Dalton R, MD      . sodium chloride flush (NS) 0.9 % injection 10 mL  10 mL Intravenous PRN [name], MD   10 mL at 11/24/15 0836      .  PHYSICAL EXAMINATION: ECOG PERFORMANCE STATUS: 0 - Asymptomatic  Vitals:   10/19/17 0847  BP: 124/76  Resp: 16  Temp: (!) 96.4 F (35.8 C)   Filed Weights   10/19/17 0847  Weight: 198 lb 6.4 oz (90 kg)    GENERAL: Well-nourished well-developed; Alert, no distress and comfortable.  She is accompanied by her husband. EYES: no pallor or icterus OROPHARYNX: no thrush or ulceration; good dentition  NECK: supple, no masses felt LYMPH:  no palpable lymphadenopathy in the cervical, axillary or inguinal regions LUNGS: clear to auscultation and  No wheeze or crackles HEART/CVS: regular rate & rhythm and no murmurs; No lower extremity edema ABDOMEN: abdomen soft, non-tender and normal bowel sounds; positive for  hepatomegaly. Musculoskeletal:no cyanosis of digits  and no clubbing  PSYCH: alert & oriented x 3 with fluent speech NEURO: no focal motor/sensory deficits SKIN:  Acne-like rash noted on the torso/face.  LABORATORY DATA:  I have reviewed the data as listed Lab Results  Component Value Date   WBC 5.0 10/19/2017   HGB 9.7 (L) 10/19/2017   HCT 28.9 (L) 10/19/2017   MCV 91.2 10/19/2017   PLT 261 10/19/2017   Recent Labs    09/19/17 0939 10/03/17 0949 10/19/17 0830  NA 135 137 138  K 4.0 3.9 4.1  CL 108 109 110  CO2 20* 21* 20*  GLUCOSE 106* 117* 88  BUN 25* 24* 20  CREATININE 1.66* 1.88* 1.70*  CALCIUM 8.5* 8.6* 8.7*  GFRNONAA 31* 27* 30*  GFRAA 36* 31* 35*  PROT 6.5 6.8 6.6  ALBUMIN 3.1* 3.2* 3.1*  AST 26 23 27   ALT 17 16 18   ALKPHOS 119 123 141*  BILITOT 0.4 0.3 0.5    RADIOGRAPHIC STUDIES: I have personally reviewed the radiological images as listed and agreed with the findings in the report. Nm Pet Image Restag (ps) Skull Base To Thigh  Addendum Date: 10/19/2017   ADDENDUM REPORT: 10/19/2017 08:33 ADDENDUM: Compared to 07/14/2017, hepatic metastatic disease has progressed and hypermetabolic abdominal/pelvic retroperitoneal and mesenteric adenopathy is new. Electronically Signed   By: Lorin Picket M.D.   On: 10/19/2017 08:33   Result Date: 10/19/2017 CLINICAL DATA:  Subsequent treatment strategy for ascending colon cancer. EXAM: NUCLEAR MEDICINE PET SKULL BASE TO THIGH TECHNIQUE: 10.6 mCi F-18 FDG was injected intravenously. Full-ring PET imaging was performed from the skull base to thigh after the radiotracer. CT data was obtained and used for attenuation correction and anatomic localization. Fasting blood glucose: 78 mg/dl COMPARISON:  07/14/2017. FINDINGS: Mediastinal blood pool activity: SUV max 2.8 NECK: No hypermetabolic lymph nodes in the neck. Incidental CT findings: None. CHEST: No hypermetabolic mediastinal, hilar or axillary lymph nodes. No hypermetabolic  pulmonary nodules. Incidental CT findings: Heart is mildly enlarged. Atherosclerotic calcification of the arterial vasculature, including coronary arteries. Left subclavian Port-A-Cath terminates at the SVC RA junction. No pericardial or pleural effusion. ABDOMEN/PELVIS: Right hepatic lobe low-attenuation ill-defined lesion measures 4.1 cm (series 4, image 116) with an SUV max of 19.3. Additional hypermetabolic low-attenuation lesions are seen in the periphery of the right and left hepatic lobes (series 4, images 129, 150 and 131, respectively). Numerous hypermetabolic lymph nodes are seen in the abdominal and pelvic retroperitoneum as well as small bowel mesentery. Index aortocaval lymph node measures 10 mm (image 140) with an SUV max of 11.5. Index lymph node at the root of the small bowel mesentery measures 10 mm (image 152) with an SUV max of 16.9. Index right common iliac lymph node measures 1.5 cm (image 187) an SUV max of 24.9. No abnormal hypermetabolism in the spleen, pancreas or adrenal glands. 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 Atherosclerotic calcification of the arterial vasculature. No free fluid. SKELETON: No abnormal osseous hypermetabolism. Incidental CT findings: Degenerative changes in the spine. IMPRESSION: 1. Hypermetabolic metastatic disease involving the liver, abdominal/pelvic retroperitoneal and mesenteric lymph nodes. 2.  Aortic atherosclerosis (ICD10-170.0).  Coronary calcification Electronically Signed: By: Lorin Picket M.D. On: 10/17/2017 12:15    Results for TACARRA, JUSTO (MRN [id number]) as of 10/03/2017 11:16  Ref. Range 102/27/202018 11:52 06/28/2017 14:31 07/04/2017 09:09 07/18/2017 08:04 08/01/2017 08:20 08/15/2017 09:09 09/05/2017 09:20 09/19/2017 09:39  CEA Latest Ref Range: 0.0 - 4.7 ng/mL 57.0 (H) 164.5 (H)  224.0 (H) 98.3 (H) 49.3 (H) 57.3 (H) 92.9 (H) 219.6 (H)    IMPRESSION: 1. Continued further decrease in size of the dominant  posterior right hepatic dome lesion demonstrating no hypermetabolic FDG accumulation above background parenchymal levels on today's study. No other hypermetabolic liver metastases are evident on the current exam. 2. No hypermetabolic lymphadenopathy in the chest, abdomen, or pelvis to suggest distant hypermetabolic metastatic involvement. The aortocaval lymph node in question on the recent diagnostic CT scan has decreased in size in the interval. 3.  Aortic Atherosclerois (ICD10-170.0)   Electronically Signed   By: Misty Stanley M.D.   On: 07/14/2017 10:36  Results for KAYNA, SUPPA (MRN [id number]) as of 10/04/2017 08:07  Ref. 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 # Patient currently on Vectibex / Enco 3 pills/ Bini 2 pills BID.   #Unfortunately, APRIL 24th 2019-PET -Worsening metastatic lesions in the liver/ worsening retroperitoneal lymph nodes; also discussed with Dr. Rosario Jacks from radiology.  CEA- 250/rising.    # Recommend discontinuation of this therapy given progression.Recommend Lonsurf as the next option.Discussed the dosing/schedule potential side effects including but not limited to neutropenia sores in the mouth etc. understands that treatments are palliative.  Repeat IR radiation could be considered to the liver; however that would not treat the retroperitoneal adenopathy.  Discussed with Ebony Hail chemo pharmacist.  Marcelina Morel discussed regarding clinical trials phase 1 at [facility]; will plan Lonsurf for now.   # acute on chronic anemia-lower GI source/currently resolved.  s/p IV Iron.  Hemoglobin 9.7.   #Hypomagnesemia magnesium-  secondary to vectibex; 1.6 stable today.   #  CKD- ? Sec to Bini/Enco- creat 1.7; [no dosage adjustment on lonsurf with GFR > 30]  # STOP enco/Bini; NO vecntibex today; de-access; follow up in 3 weeks/labs.  Addendum: Discussed with the patient daughter over the phone Erin-regarding the above plan; also discussed regarding clinical trial options/and also evaluation at tertiary care centers like [facility].  Daughter states that she will talk to her mother/and family and let us know-then will initiate a referral.   # I reviewed the blood work- with the patient in detail; also reviewed the imaging independently [as summarized above]; and with the patient in detail.      [name], MD 10/20/2017 9:00 AM

## 2017-10-19 NOTE — Telephone Encounter (Signed)
FYI- LVMfor daughter to call us back. Thx

## 2017-10-19 NOTE — Telephone Encounter (Signed)
Oral Oncology Patient Advocate Encounter  Received notification from [facility] that prior authorization for [place] is required.  PA submitted on CoverMyMeds Key (20-8.19 mg tablets): [id number]        (15-6.14mg  tabets):  [id number] Status is pending  Oral Oncology Clinic will continue to follow.  Fabio Asa. Melynda Keller, [place] Patient [place] [phone] 10/19/2017 3:40 PM

## 2017-10-19 NOTE — Telephone Encounter (Signed)
Daughter Junie Panning called stating that she is returning Dr Agnes [name] call. She is a Marine scientist and does patient care so if she does not answer, give her a couple minutes to finish up and then call again [phone]

## 2017-10-19 NOTE — Telephone Encounter (Signed)
Daughter returned md phone call. md spoke with daughter.

## 2017-10-20 NOTE — Telephone Encounter (Signed)
Oral Chemotherapy Pharmacist Encounter   Lonsurf copay is $1966.25/month. Called patient to inform her, she will stop by today to fill out assistance paperwork. Asked her to bring with her her federal taxes because I copy is required to be sent with the application.   Darl Pikes, PharmD, BCPS Hematology/Oncology Clinical Pharmacist [facility] [phone]  10/20/2017 10:05 AM

## 2017-10-20 NOTE — Telephone Encounter (Signed)
Oral Oncology Pharmacist Encounter  Received notification from [facility] that prior authorization for [place] was approved.  Referral #: [id number] Effective: 06/19/17-06-20-18  I will place a copy of the approval letter to be scanned into patient's chart.  Darl Pikes, PharmD, BCPS Hematology/Oncology Clinical Pharmacist [name] [phone]  10/20/2017 10:04 AM

## 2017-10-21 NOTE — Telephone Encounter (Signed)
Oral Chemotherapy Pharmacist Encounter   Sent off Kathleen James's Lonsurf patient assistance application. Will follow-up on status of application.   Darl Pikes, PharmD, BCPS Hematology/Oncology Clinical Pharmacist [facility] [place] [phone]  10/21/2017 11:37 AM

## 2017-10-26 ENCOUNTER — Encounter: Payer: Self-pay | Admitting: Internal Medicine

## 2017-10-27 ENCOUNTER — Encounter: Payer: Self-pay | Admitting: Internal Medicine

## 2017-10-28 NOTE — Telephone Encounter (Signed)
Oral Chemotherapy Pharmacist Encounter   [employer] patient assistance approved.  Effective 10/28/17-06/19/18  I will place a copy of the approval letter to be scanned into patient's chart. Patient informed.   Darl Pikes, PharmD, BCPS Hematology/Oncology Clinical Pharmacist [facility] [place] [phone]  10/28/2017 4:30 PM

## 2017-10-31 ENCOUNTER — Telehealth: Payer: Self-pay | Admitting: Pharmacist

## 2017-10-31 NOTE — Telephone Encounter (Signed)
Oral Chemotherapy Pharmacist Encounter   Unfortunately because Ms. Kath is depended on manufacturer assistance to for her Lonsurf, we are beholding to the speed at which they process her application and the speed of the pharmacy ([facility]) that the manufacturer uses to process the prescription.  Ms. Gaines application was faxed to [employer] patient assistance on 10/21/17. Unfortunately it is not unusual for this process to take 2-3 weeks.  I have been following up on the status of her application/prescription and provided phone numbers for [employer] and [facility] for Ms. Murrillo to participate in the process. When I call today to ensure that that had all the information they need (which they did), I asked that they mark her case as urgent. I followed up with Ms. Stamps to give her an update.    Darl Pikes, PharmD, BCPS Hematology/Oncology Clinical Pharmacist [facility] [phone]  10/31/2017 1:16 PM

## 2017-11-01 NOTE — Telephone Encounter (Signed)
Oral Chemotherapy Pharmacist Encounter   Kathleen James will be delivered to the patient tomorrow 11/02/17.   Darl Pikes, PharmD, BCPS Hematology/Oncology Clinical Pharmacist [facility] [place] [phone]  11/01/2017 11:39 AM

## 2017-11-08 ENCOUNTER — Encounter: Payer: Self-pay | Admitting: Internal Medicine

## 2017-11-08 ENCOUNTER — Inpatient Hospital Stay: Payer: Medicare HMO | Attending: Internal Medicine

## 2017-11-08 ENCOUNTER — Inpatient Hospital Stay (HOSPITAL_BASED_OUTPATIENT_CLINIC_OR_DEPARTMENT_OTHER): Payer: Medicare HMO | Admitting: Internal Medicine

## 2017-11-08 VITALS — BP 136/89 | HR 71 | Temp 97.9°F | Resp 16 | Wt 198.9 lb

## 2017-11-08 DIAGNOSIS — C799 Secondary malignant neoplasm of unspecified site: Secondary | ICD-10-CM

## 2017-11-08 DIAGNOSIS — R5383 Other fatigue: Secondary | ICD-10-CM

## 2017-11-08 DIAGNOSIS — R110 Nausea: Secondary | ICD-10-CM

## 2017-11-08 DIAGNOSIS — C772 Secondary and unspecified malignant neoplasm of intra-abdominal lymph nodes: Secondary | ICD-10-CM

## 2017-11-08 DIAGNOSIS — R5381 Other malaise: Secondary | ICD-10-CM

## 2017-11-08 DIAGNOSIS — C787 Secondary malignant neoplasm of liver and intrahepatic bile duct: Secondary | ICD-10-CM | POA: Diagnosis not present

## 2017-11-08 DIAGNOSIS — C182 Malignant neoplasm of ascending colon: Secondary | ICD-10-CM

## 2017-11-08 DIAGNOSIS — I129 Hypertensive chronic kidney disease with stage 1 through stage 4 chronic kidney disease, or unspecified chronic kidney disease: Secondary | ICD-10-CM | POA: Diagnosis not present

## 2017-11-08 DIAGNOSIS — R970 Elevated carcinoembryonic antigen [CEA]: Secondary | ICD-10-CM

## 2017-11-08 DIAGNOSIS — C189 Malignant neoplasm of colon, unspecified: Secondary | ICD-10-CM

## 2017-11-08 DIAGNOSIS — Z79899 Other long term (current) drug therapy: Secondary | ICD-10-CM

## 2017-11-08 DIAGNOSIS — R160 Hepatomegaly, not elsewhere classified: Secondary | ICD-10-CM | POA: Diagnosis not present

## 2017-11-08 DIAGNOSIS — D649 Anemia, unspecified: Secondary | ICD-10-CM | POA: Diagnosis not present

## 2017-11-08 DIAGNOSIS — M1990 Unspecified osteoarthritis, unspecified site: Secondary | ICD-10-CM

## 2017-11-08 DIAGNOSIS — N183 Chronic kidney disease, stage 3 (moderate): Secondary | ICD-10-CM

## 2017-11-08 DIAGNOSIS — E669 Obesity, unspecified: Secondary | ICD-10-CM

## 2017-11-08 LAB — CBC WITH DIFFERENTIAL/PLATELET
Basophils Absolute: 0 10*3/uL (ref 0–0.1)
Basophils Relative: 1 %
EOS ABS: 0.1 10*3/uL (ref 0–0.7)
Eosinophils Relative: 2 %
HCT: 31.3 % — ABNORMAL LOW (ref 35.0–47.0)
Hemoglobin: 10.3 g/dL — ABNORMAL LOW (ref 12.0–16.0)
LYMPHS ABS: 0.5 10*3/uL — AB (ref 1.0–3.6)
Lymphocytes Relative: 13 %
MCH: 29.7 pg (ref 26.0–34.0)
MCHC: 33 g/dL (ref 32.0–36.0)
MCV: 89.9 fL (ref 80.0–100.0)
MONO ABS: 0.1 10*3/uL — AB (ref 0.2–0.9)
MONOS PCT: 2 %
Neutro Abs: 3.1 10*3/uL (ref 1.4–6.5)
Neutrophils Relative %: 82 %
PLATELETS: 306 10*3/uL (ref 150–440)
RBC: 3.48 MIL/uL — ABNORMAL LOW (ref 3.80–5.20)
RDW: 19.8 % — AB (ref 11.5–14.5)
WBC: 3.8 10*3/uL (ref 3.6–11.0)

## 2017-11-08 LAB — COMPREHENSIVE METABOLIC PANEL
ALK PHOS: 137 U/L — AB (ref 38–126)
ALT: 14 U/L (ref 14–54)
AST: 25 U/L (ref 15–41)
Albumin: 3.8 g/dL (ref 3.5–5.0)
Anion gap: 9 (ref 5–15)
BUN: 18 mg/dL (ref 6–20)
CHLORIDE: 103 mmol/L (ref 101–111)
CO2: 24 mmol/L (ref 22–32)
CREATININE: 1.6 mg/dL — AB (ref 0.44–1.00)
Calcium: 8.9 mg/dL (ref 8.9–10.3)
GFR calc Af Amer: 37 mL/min — ABNORMAL LOW (ref >60)
GFR calc non Af Amer: 32 mL/min — ABNORMAL LOW (ref >60)
Glucose, Bld: 106 mg/dL — ABNORMAL HIGH (ref 65–99)
Potassium: 4.8 mmol/L (ref 3.5–5.1)
SODIUM: 136 mmol/L (ref 135–145)
Total Bilirubin: 0.5 mg/dL (ref 0.3–1.2)
Total Protein: 7.5 g/dL (ref 6.5–8.1)

## 2017-11-08 NOTE — Assessment & Plan Note (Addendum)
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  #Continue Lonsurf; currently cycle number 1 day 9; she will start Lonsurf cycle number 1 day 10 tomorrow [523]  # Discussed regarding referral to [facility]; currently on hold because of current therapy with Lonsurf   # acute on chronic anemia-lower GI source/currently resolved.  s/p IV Iron.  Hemoglobin ~10/stable  # CKD- III/ 1.6 stable.   #Follow-up May 12 with covering physician; CBC CMP CEA.

## 2017-11-08 NOTE — Progress Notes (Signed)
[place] OFFICE PROGRESS NOTE  [name], [name], MD (Internal Medicine) [name], [name], RN as Registered Nurse [name], MD as Consulting Physician (Internal Medicine)  Cancer Staging No matching staging information was found for the patient.   Oncology History   # MARCH/APRIL 2017-NEUROENDOCRINE CA STAGE IV; [name]; [name]; [name]; [name]; [name]; [name]; [name]; July 14th  2017 CT- PR  # AUG 28th- FOLFIRI +Avastin; OCT 5th CT scan- PR.   # MARCH 20th- CT STABLE liver lesions; ? Enlarging cecal lesion [CEA rising]; March 26th- FOLFOX [last avastin march 12th 2018];   # resection of primary tumor [Dr.Byrnett]; y90- June 19th 2018.  # July 11th-FOLFOX + Avastin; SEP 2018 [CT- stable liver lesions; increasing ~50m RP LN]; RISING CEA [stopped end of Sep 2018 ]  # OCT 15th 2018- Vemu+ Iri+ Pan [mid nov stopped iri- neutropenia/dia+vemu- arthralgias]; 05/11/2017-Started enco+Bini+vectibex; April 28th PET- PROGRESSION  #    # acute lower GIB-EGD/ colonoscopy [Dr.Anna]- - ulcerated Lesion along the anastomotic line;  biopsy negative for recurrence.    # [facility]- B-RAF V600E- MUTATED [May 24888] # right kidney lesion 2.6 x 1.6 cm ? 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  female pleasant patient above history of metastatic colon cancer/Braf positive currently on Lonsurf is here for follow-up.  Patient complains of mild nausea otherwise no vomiting.  Complains of mild fatigue.  No blood in stool no blood per stool.  No swelling in the legs.  Review of Systems  Constitutional: Positive for malaise/fatigue. Negative for chills, diaphoresis, fever and weight loss.  HENT: Negative for nosebleeds and sore throat.   Eyes: Negative for double vision.  Respiratory: Negative for cough, hemoptysis, sputum production, shortness of breath and wheezing.   Cardiovascular: Negative for chest pain, palpitations, orthopnea and leg swelling.  Gastrointestinal: Positive for nausea. Negative for abdominal pain, blood in stool, constipation, diarrhea, heartburn, melena and vomiting.  Genitourinary: Negative for dysuria, frequency and urgency.  Musculoskeletal: Negative for back pain and joint pain.  Skin: Negative.  Negative for itching and rash.  Neurological: Negative for dizziness, tingling, focal weakness, weakness and headaches.  Endo/Heme/Allergies: Does not bruise/bleed easily.  Psychiatric/Behavioral: Negative for depression. The patient is not nervous/anxious and does not have insomnia.       PAST MEDICAL HISTORY :  Past Medical History:  Diagnosis Date  . Angiolipoma of kidney    followed with serial CTs ,,  Dr. CJacqlyn Larsen . Anxiety   . Arthritis   . 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 Chemotherapy induced diarrhea   . Hammer toe   . Hypertension   . Liver cancer ([place]   . Neuro-endocrine carcinoma ([place]   . Obesity (BMI 30-39.9)     PAST SURGICAL HISTORY :   Past  Surgical History:  Procedure Laterality Date  . ABDOMINAL HYSTERECTOMY  1995   endometriosis, heavy bleeding  . BUNIONECTOMY Bilateral   . CESAREAN SECTION  1985  . COLONOSCOPY WITH PROPOFOL N/A  10/09/2015   Procedure: COLONOSCOPY WITH PROPOFOL;  Surgeon: Robert Bellow, MD;  Location: [facility] ENDOSCOPY;  Service: Endoscopy;  Laterality: N/A;  . COLONOSCOPY WITH PROPOFOL N/A 09/07/2017   Procedure: COLONOSCOPY WITH PROPOFOL;  Surgeon: Jonathon Bellows, MD;  Location: [facility] ENDOSCOPY;  Service: Gastroenterology;  Laterality: N/A;  . ESOPHAGOGASTRODUODENOSCOPY (EGD) WITH PROPOFOL N/A 09/07/2017   Procedure: ESOPHAGOGASTRODUODENOSCOPY (EGD) WITH PROPOFOL;  Surgeon: Jonathon Bellows, MD;  Location: [facility] ENDOSCOPY;  Service: Gastroenterology;  Laterality: N/A;  . IR ANGIOGRAM SELECTIVE EACH ADDITIONAL VESSEL  11/22/2016  . IR ANGIOGRAM SELECTIVE EACH ADDITIONAL VESSEL  11/22/2016  . IR ANGIOGRAM SELECTIVE EACH ADDITIONAL VESSEL  11/22/2016  . IR ANGIOGRAM SELECTIVE EACH ADDITIONAL VESSEL  12/07/2016  . IR ANGIOGRAM SELECTIVE EACH ADDITIONAL VESSEL  12/07/2016  . IR ANGIOGRAM VISCERAL SELECTIVE  11/22/2016  . IR ANGIOGRAM VISCERAL SELECTIVE  12/07/2016  . IR EMBO ARTERIAL NOT HEMORR HEMANG INC GUIDE ROADMAPPING  11/22/2016  . IR EMBO TUMOR ORGAN ISCHEMIA INFARCT INC GUIDE ROADMAPPING  12/07/2016  . IR GENERIC HISTORICAL  04/07/2016   IR RADIOLOGIST EVAL & MGMT 04/07/2016 Arne Cleveland, MD [facility] INTERV RAD  . IR RADIOLOGIST EVAL & MGMT  04/14/2017  . IR US GUIDE VASC ACCESS RIGHT  11/22/2016  . IR US GUIDE VASC ACCESS RIGHT  12/07/2016  . LAPAROSCOPIC RIGHT COLECTOMY Right 10/26/2016   Procedure: LAPAROSCOPIC RIGHT COLECTOMY;  Surgeon: Robert Bellow, MD;  Location: [facility] ORS;  Service: General;  Laterality: Right;  . OOPHORECTOMY    . PORTACATH PLACEMENT Left 10/01/2015   Procedure: INSERTION PORT-A-CATH;  Surgeon: Robert Bellow, MD;  Location: [facility] ORS;  Service: General;  Laterality: Left;  . ROTATOR CUFF REPAIR Right 2008   right shoulder.  [place]     FAMILY HISTORY :   Family History  Problem Relation Age of Onset  . Mental illness Mother 80       bipolar, dementia  . Cancer Maternal Aunt 70        breast ca  . Hypertension Father   . Stroke Father 52       deceased  . COPD Father   . Heart disease Sister        deceased    SOCIAL HISTORY:   Social History   Tobacco Use  . Smoking status: Never Smoker  . Smokeless tobacco: Never Used  Substance Use Topics  . Alcohol use: Yes    Alcohol/week: 0.0 oz    Comment: occasionally  . Drug use: No    ALLERGIES:  is allergic to bee venom; dilaudid [hydromorphone hcl]; and morphine and related.  MEDICATIONS:  Current Outpatient Medications  Medication Sig Dispense Refill  . ALPRAZolam (XANAX) 0.25 MG tablet Take 1 tablet (0.25 mg total) by mouth 2 (two) times daily as needed for anxiety. 60 tablet 3  . Biotin w/ Vitamins C & E (HAIR/SKIN/NAILS PO) Take 1 tablet by mouth daily.    . Chlorpheniramine Maleate (CHLOR-TABLETS PO) Take 1 tablet by mouth every evening.    . diphenoxylate-atropine (LOMOTIL) 2.5-0.025 MG tablet TAKE 1 TABLET FOUR TIMES DAILY AS NEEDEDFOR DIARRHEA OR LOOSE STOOLS 30 tablet 3  . lidocaine-prilocaine (EMLA) cream Apply 1 application topically as needed. Apply to port a cath site 1 hour prior to chemotherapy treatments 30 g 6  .  loperamide (IMODIUM A-D) 2 MG tablet Take 2 mg by mouth 4 (four) times daily as needed for diarrhea or loose stools.    . metoprolol tartrate (LOPRESSOR) 25 MG tablet Take 1 tablet (25 mg total) by mouth 2 (two) times daily. 60 tablet 3  . ondansetron (ZOFRAN) 8 MG tablet Take 1 tablet (8 mg total) by mouth every 8 (eight) hours as needed for nausea or vomiting. 40 tablet 3  . pantoprazole (PROTONIX) 40 MG tablet Take 1 tablet (40 mg total) by mouth daily. 30 tablet 1  . potassium chloride (K-DUR) 10 MEQ tablet TAKE ONE TABLET BY MOUTH TWICE DAILY 60 tablet 3  . prochlorperazine (COMPAZINE) 10 MG tablet TAKE ONE TABLET BY MOUTH EVERY 6 HOURS AS NEEDED FOR NAUSEA OR VOMITING 30 tablet 2  . triamcinolone ointment (KENALOG) 0.5 % Apply 1 application topically 2 (two) times daily. To rash 30  g 0  . trifluridine-tipiracil (LONSURF) 15-6.14 MG tablet Take 2 tablets by mouth twice daily, 1 hr after AM & PM meals on days 1-5, 8-12. Take with two Lonsurf 20-8.19 mg tablets. Repeat every 28d 40 tablet 4  . trifluridine-tipiracil (LONSURF) 20-8.19 MG tablet Take 2 tablets by mouth twice daily, 1 hr after AM & PM meals on days 1-5, 8-12. Take with two Lonsurf 15-6.14 mg tablets. Repeat every 28d 40 tablet 4   No current facility-administered medications for this visit.    Facility-Administered Medications Ordered in Other Visits  Medication Dose Route Frequency Provider Last Rate Last Dose  . heparin lock flush 100 unit/mL  500 Units Intravenous Once Charlaine Dalton R, MD      . sodium chloride flush (NS) 0.9 % injection 10 mL  10 mL Intravenous PRN [name], MD   10 mL at 11/24/15 0836    PHYSICAL EXAMINATION: ECOG PERFORMANCE STATUS: 1 - Symptomatic but completely ambulatory  BP 136/89 (BP Location: Left Arm, Patient Position: Sitting)   Pulse 71   Temp 97.9 F (36.6 C) (Tympanic)   Resp 16   Wt 198 lb 13.7 oz (90.2 kg)   BMI 34.13 kg/m   Filed Weights   11/08/17 1116  Weight: 198 lb 13.7 oz (90.2 kg)    GENERAL: Well-nourished well-developed; Alert, no distress and comfortable.  With her daughter.  EYES: no pallor or icterus OROPHARYNX: no thrush or ulceration; NECK: supple; no lymph nodes felt. LYMPH:  no palpable lymphadenopathy in the axillary or inguinal regions LUNGS: Decreased breath sounds auscultation bilaterally. No wheeze or crackles HEART/CVS: regular rate & rhythm and no murmurs; No lower extremity edema ABDOMEN:abdomen soft, non-tender and normal bowel sounds. No hepatomegaly or splenomegaly.  Musculoskeletal:no cyanosis of digits and no clubbing  PSYCH: alert & oriented x 3 with fluent speech NEURO: no focal motor/sensory deficits SKIN:  no rashes or significant lesions    LABORATORY DATA:  I have reviewed the data as listed     Component Value Date/Time   NA 136 11/08/2017 1103   NA 138 11/04/2016 1009   K 4.8 11/08/2017 1103   CL 103 11/08/2017 1103   CO2 24 11/08/2017 1103   GLUCOSE 106 (H) 11/08/2017 1103   BUN 18 11/08/2017 1103   BUN 14 11/04/2016 1009   CREATININE 1.60 (H) 11/08/2017 1103   CALCIUM 8.9 11/08/2017 1103   PROT 7.5 11/08/2017 1103   PROT 7.9 11/04/2016 1009   ALBUMIN 3.8 11/08/2017 1103   ALBUMIN 4.2 11/04/2016 1009   AST 25 11/08/2017 1103   ALT 14  11/08/2017 1103   ALKPHOS 137 (H) 11/08/2017 1103   BILITOT 0.5 11/08/2017 1103   BILITOT 0.3 11/04/2016 1009   GFRNONAA 32 (L) 11/08/2017 1103   GFRAA 37 (L) 11/08/2017 1103    No results found for: SPEP, UPEP  Lab Results  Component Value Date   WBC 3.8 11/08/2017   NEUTROABS 3.1 11/08/2017   HGB 10.3 (L) 11/08/2017   HCT 31.3 (L) 11/08/2017   MCV 89.9 11/08/2017   PLT 306 11/08/2017      Chemistry      Component Value Date/Time   NA 136 11/08/2017 1103   NA 138 11/04/2016 1009   K 4.8 11/08/2017 1103   CL 103 11/08/2017 1103   CO2 24 11/08/2017 1103   BUN 18 11/08/2017 1103   BUN 14 11/04/2016 1009   CREATININE 1.60 (H) 11/08/2017 1103      Component Value Date/Time   CALCIUM 8.9 11/08/2017 1103   ALKPHOS 137 (H) 11/08/2017 1103   AST 25 11/08/2017 1103   ALT 14 11/08/2017 1103   BILITOT 0.5 11/08/2017 1103   BILITOT 0.3 11/04/2016 1009       RADIOGRAPHIC STUDIES: I have personally reviewed the radiological images as listed and agreed with the findings in the report. No results found.   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  #Continue Lonsurf; currently cycle number 1 day 9; she will start Lonsurf cycle number 1 day 10 tomorrow [523]  # Discussed regarding referral to MD Ouida Sills; currently on hold because of current therapy with Lonsurf   # acute on chronic  anemia-lower GI source/currently resolved.  s/p IV Iron.  Hemoglobin ~10/stable  # CKD- III/ 1.6 stable.   #Follow-up May 12 with covering physician; CBC CMP CEA.    Orders Placed This Encounter  Procedures  . CBC with Differential/Platelet    Standing Status:   Future    Standing Expiration Date:   11/09/2018  . Comprehensive metabolic panel    Standing Status:   Future    Standing Expiration Date:   11/09/2018  . CEA    Standing Status:   Future    Standing Expiration Date:   11/09/2018   All questions were answered. The patient knows to call the clinic with any problems, questions or concerns.      [name], MD 11/08/2017 4:27 PM

## 2017-11-09 ENCOUNTER — Encounter: Payer: Self-pay | Admitting: Internal Medicine

## 2017-11-09 ENCOUNTER — Other Ambulatory Visit: Payer: Self-pay

## 2017-11-09 ENCOUNTER — Ambulatory Visit: Payer: Self-pay | Admitting: Internal Medicine

## 2017-11-09 ENCOUNTER — Other Ambulatory Visit: Payer: Self-pay | Admitting: *Deleted

## 2017-11-09 LAB — CEA: CEA: 1799 ng/mL — ABNORMAL HIGH (ref 0.0–4.7)

## 2017-11-09 MED ORDER — METOPROLOL TARTRATE 25 MG PO TABS: 25.0000 mg | ORAL_TABLET | Freq: Two times a day (BID) | ORAL | 6 refills | Status: DC

## 2017-11-27 NOTE — Progress Notes (Signed)
[place]  [phone] [phone]  ID: Kathleen James OB: 01/07/1951  MR#: [id number]  NGE#:[id number]  [name], [name], MD (Internal Medicine) [name], [name], RN as Registered Nurse [name], MD as Consulting Physician (Internal Medicine)  CHIEF COMPLAINT: BRAF positive metastatic colon cancer.  INTERVAL HISTORY: Patient returns to clinic today for further evaluation prior to initiating cycle 2 of Lonsurf. She tolerated her first cycle well without significant side effects.  She is anxious, but otherwise feels well.  She has no neurologic complaints.  She denies any recent fevers or illnesses.  She has mild fatigue.  She denies any chest pain or shortness of breath.  She has occasional nausea, but denies any vomiting, constipation, or diarrhea.  She has no melena or hematochezia.  She has no urinary complaints.  Patient offers no further specific complaints.  REVIEW OF SYSTEMS:   Review of Systems  Constitutional: Positive for malaise/fatigue. Negative for fever and weight loss.  Respiratory: Negative.  Negative for cough and shortness of breath.   Cardiovascular: Negative.  Negative for chest pain and leg swelling.  Gastrointestinal: Positive for nausea. Negative for abdominal pain, blood in stool and melena.  Genitourinary: Negative.  Negative for dysuria.  Musculoskeletal: Negative.  Negative for myalgias.  Skin: Negative.  Negative for rash.  Neurological: Negative.  Negative for sensory change, focal weakness and weakness.  Psychiatric/Behavioral: The patient is nervous/anxious.     As per HPI. Otherwise, a complete review of systems is negative.  PAST MEDICAL HISTORY: Past Medical History:  Diagnosis Date  . Angiolipoma of kidney    followed with serial CTs ,,  Dr. Jacqlyn Larsen  . Anxiety   . Arthritis     . 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 Chemotherapy induced diarrhea   . Hammer toe   . Hypertension   . Liver cancer ([place])   . Neuro-endocrine carcinoma ([place])   . Obesity (BMI 30-39.9)     PAST SURGICAL HISTORY: Past Surgical History:  Procedure Laterality Date  . ABDOMINAL HYSTERECTOMY  1995   endometriosis, heavy bleeding  . BUNIONECTOMY Bilateral   . CESAREAN SECTION  1985  . COLONOSCOPY WITH PROPOFOL N/A 10/09/2015   Procedure: COLONOSCOPY WITH PROPOFOL;  Surgeon: Robert Bellow, MD;  Location: [facility] ENDOSCOPY;  Service: Endoscopy;  Laterality: N/A;  . COLONOSCOPY WITH PROPOFOL N/A 09/07/2017   Procedure: COLONOSCOPY WITH PROPOFOL;  Surgeon: Jonathon Bellows, MD;  Location: [facility] ENDOSCOPY;  Service: Gastroenterology;  Laterality: N/A;  . ESOPHAGOGASTRODUODENOSCOPY (EGD) WITH PROPOFOL N/A 09/07/2017   Procedure: ESOPHAGOGASTRODUODENOSCOPY (EGD) WITH PROPOFOL;  Surgeon: Jonathon Bellows, MD;  Location: [facility] ENDOSCOPY;  Service: Gastroenterology;  Laterality: N/A;  . IR ANGIOGRAM SELECTIVE EACH ADDITIONAL VESSEL  11/22/2016  . IR ANGIOGRAM SELECTIVE EACH ADDITIONAL VESSEL  11/22/2016  . IR ANGIOGRAM SELECTIVE EACH ADDITIONAL VESSEL  11/22/2016  . IR ANGIOGRAM SELECTIVE EACH ADDITIONAL VESSEL  12/07/2016  . IR ANGIOGRAM SELECTIVE EACH ADDITIONAL VESSEL  12/07/2016  . IR ANGIOGRAM VISCERAL SELECTIVE  11/22/2016  . IR ANGIOGRAM VISCERAL SELECTIVE  12/07/2016  . IR EMBO ARTERIAL NOT HEMORR HEMANG INC GUIDE ROADMAPPING  11/22/2016  . IR EMBO TUMOR ORGAN ISCHEMIA INFARCT INC GUIDE ROADMAPPING  12/07/2016  . IR GENERIC HISTORICAL  04/07/2016   IR RADIOLOGIST EVAL & MGMT 04/07/2016 Arne Cleveland, MD [facility] INTERV RAD  .  IR RADIOLOGIST EVAL & MGMT  04/14/2017  . IR US GUIDE VASC ACCESS RIGHT  11/22/2016  . IR US GUIDE VASC ACCESS RIGHT  12/07/2016  . LAPAROSCOPIC RIGHT COLECTOMY Right 10/26/2016   Procedure: LAPAROSCOPIC RIGHT COLECTOMY;  Surgeon: Robert Bellow, MD;  Location: [facility] ORS;  Service: General;  Laterality: Right;  . OOPHORECTOMY    . PORTACATH PLACEMENT Left 10/01/2015   Procedure: INSERTION PORT-A-CATH;  Surgeon: Robert Bellow, MD;  Location: [facility] ORS;  Service: General;  Laterality: Left;  . ROTATOR CUFF REPAIR Right 2008   right shoulder.  [place]     FAMILY HISTORY: Family History  Problem Relation Age of Onset  . Mental illness Mother 66       bipolar, dementia  . Cancer Maternal Aunt 70       breast ca  . Hypertension Father   . Stroke Father 33       deceased  . COPD Father   . Heart disease Sister        deceased    ADVANCED DIRECTIVES (Y/N):  N  HEALTH MAINTENANCE: Social History   Tobacco Use  . Smoking status: Never Smoker  . Smokeless tobacco: Never Used  Substance Use Topics  . Alcohol use: Yes    Alcohol/week: 0.0 oz    Comment: occasionally  . Drug use: No     Colonoscopy:  PAP:  Bone density:  Lipid panel:  Allergies  Allergen Reactions  . Bee Venom Hives    Has epi-pen  . Dilaudid [Hydromorphone Hcl] Nausea And Vomiting  . Morphine And Related Nausea Only    Current Outpatient Medications  Medication Sig Dispense Refill  . ALPRAZolam (XANAX) 0.25 MG tablet Take 1 tablet (0.25 mg total) by mouth 2 (two) times daily as needed for anxiety. 60 tablet 3  . Biotin w/ Vitamins C & E (HAIR/SKIN/NAILS PO) Take 1 tablet by mouth daily.    . Chlorpheniramine Maleate (CHLOR-TABLETS PO) Take 1 tablet by mouth every evening.    . diphenoxylate-atropine (LOMOTIL) 2.5-0.025 MG tablet TAKE 1 TABLET FOUR TIMES DAILY AS NEEDEDFOR DIARRHEA OR LOOSE STOOLS 30 tablet 3  . lidocaine-prilocaine (EMLA) cream Apply 1 application topically as needed. Apply to port a cath site 1 hour prior to chemotherapy treatments 30 g 6  . loperamide (IMODIUM A-D) 2 MG tablet Take 2 mg by mouth 4 (four) times daily as needed for diarrhea or loose stools.    . metoprolol tartrate (LOPRESSOR) 25 MG tablet Take 1  tablet (25 mg total) by mouth 2 (two) times daily. 60 tablet 6  . ondansetron (ZOFRAN) 8 MG tablet Take 1 tablet (8 mg total) by mouth every 8 (eight) hours as needed for nausea or vomiting. 40 tablet 3  . pantoprazole (PROTONIX) 40 MG tablet Take 1 tablet (40 mg total) by mouth daily. 30 tablet 1  . potassium chloride (K-DUR) 10 MEQ tablet TAKE ONE TABLET BY MOUTH TWICE DAILY 60 tablet 3  . prochlorperazine (COMPAZINE) 10 MG tablet TAKE ONE TABLET BY MOUTH EVERY 6 HOURS AS NEEDED FOR NAUSEA OR VOMITING 30 tablet 2  . trifluridine-tipiracil (LONSURF) 15-6.14 MG tablet Take 2 tablets by mouth twice daily, 1 hr after AM & PM meals on days 1-5, 8-12. Take with two Lonsurf 20-8.19 mg tablets. Repeat every 28d 40 tablet 4  . trifluridine-tipiracil (LONSURF) 20-8.19 MG tablet Take 2 tablets by mouth twice daily, 1 hr after AM & PM meals on days 1-5, 8-12. Take with two Lonsurf  15-6.14 mg tablets. Repeat every 28d 40 tablet 4  . triamcinolone ointment (KENALOG) 0.5 % Apply 1 application topically 2 (two) times daily. To rash (Patient not taking: Reported on 11/30/2017) 30 g 0   No current facility-administered medications for this visit.    Facility-Administered Medications Ordered in Other Visits  Medication Dose Route Frequency Provider Last Rate Last Dose  . heparin lock flush 100 unit/mL  500 Units Intravenous Once Charlaine Dalton R, MD      . sodium chloride flush (NS) 0.9 % injection 10 mL  10 mL Intravenous PRN [name], MD   10 mL at 11/24/15 0836    OBJECTIVE: Vitals:   11/30/17 1559  BP: (!) 160/98  Pulse: 73  Resp: 18  Temp: (!) 97.5 F (36.4 C)     Body mass index is 34.41 kg/m.    ECOG FS:0 - Asymptomatic  General: Well-developed, well-nourished, no acute distress. Eyes: Pink conjunctiva, anicteric sclera. HEENT: Normocephalic, moist mucous membranes, clear oropharnyx. Lungs: Clear to auscultation bilaterally. Heart: Regular rate and rhythm. No rubs, murmurs, or  gallops. Abdomen: Soft, nontender, nondistended. No organomegaly noted, normoactive bowel sounds. Musculoskeletal: No edema, cyanosis, or clubbing. Neuro: Alert, answering all questions appropriately. Cranial nerves grossly intact. Skin: No rashes or petechiae noted. Psych: Normal affect.   LAB RESULTS:  Lab Results  Component Value Date   NA 136 11/30/2017   K 4.1 11/30/2017   CL 108 11/30/2017   CO2 21 (L) 11/30/2017   GLUCOSE 107 (H) 11/30/2017   BUN 20 11/30/2017   CREATININE 1.21 (H) 11/30/2017   CALCIUM 9.2 11/30/2017   PROT 7.5 11/30/2017   ALBUMIN 3.8 11/30/2017   AST 30 11/30/2017   ALT 18 11/30/2017   ALKPHOS 174 (H) 11/30/2017   BILITOT 0.4 11/30/2017   GFRNONAA 45 (L) 11/30/2017   GFRAA 52 (L) 11/30/2017    Lab Results  Component Value Date   WBC 2.4 (L) 11/30/2017   NEUTROABS 1.1 (L) 11/30/2017   HGB 10.1 (L) 11/30/2017   HCT 30.0 (L) 11/30/2017   MCV 91.7 11/30/2017   PLT 296 11/30/2017     STUDIES: No results found.  ASSESSMENT: BRAF positive metastatic colon cancer.  PLAN:    1. 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  Proceed with cycle 2 of Lonsurf which patient initiated today.  Previously discussed referral to MD Ouida Sills.  Return to clinic in 2 weeks for laboratory work only and then in 4 weeks for laboratory work and further evaluation. 2.  Acute on chronic anemia: Patient's hemoglobin is decreased, but stable.  Monitor. 3.  Leukopenia/neutropenia: Proceed cautiously with treatment as above.  Repeat laboratory work in 2 weeks as above.  4.  Chronic renal insufficiency: Patient's creatinine is improved, monitor.   Patient expressed understanding and was in agreement with this plan. She also understands that She can call clinic at any time with any questions, concerns, or complaints.   Cancer Staging No matching staging  information was found for the patient.  Lloyd Huger, MD   12/04/2017 7:40 AM

## 2017-11-30 ENCOUNTER — Inpatient Hospital Stay: Payer: Medicare HMO

## 2017-11-30 ENCOUNTER — Encounter: Payer: Self-pay | Admitting: Oncology

## 2017-11-30 ENCOUNTER — Inpatient Hospital Stay: Payer: Medicare HMO | Attending: Oncology | Admitting: Oncology

## 2017-11-30 VITALS — BP 160/98 | HR 73 | Temp 97.5°F | Resp 18 | Wt 200.4 lb

## 2017-11-30 DIAGNOSIS — N189 Chronic kidney disease, unspecified: Secondary | ICD-10-CM | POA: Insufficient documentation

## 2017-11-30 DIAGNOSIS — C787 Secondary malignant neoplasm of liver and intrahepatic bile duct: Secondary | ICD-10-CM

## 2017-11-30 DIAGNOSIS — I129 Hypertensive chronic kidney disease with stage 1 through stage 4 chronic kidney disease, or unspecified chronic kidney disease: Secondary | ICD-10-CM

## 2017-11-30 DIAGNOSIS — R5381 Other malaise: Secondary | ICD-10-CM | POA: Insufficient documentation

## 2017-11-30 DIAGNOSIS — D709 Neutropenia, unspecified: Secondary | ICD-10-CM | POA: Diagnosis not present

## 2017-11-30 DIAGNOSIS — M1990 Unspecified osteoarthritis, unspecified site: Secondary | ICD-10-CM | POA: Diagnosis not present

## 2017-11-30 DIAGNOSIS — C772 Secondary and unspecified malignant neoplasm of intra-abdominal lymph nodes: Secondary | ICD-10-CM | POA: Insufficient documentation

## 2017-11-30 DIAGNOSIS — Z79899 Other long term (current) drug therapy: Secondary | ICD-10-CM | POA: Insufficient documentation

## 2017-11-30 DIAGNOSIS — R160 Hepatomegaly, not elsewhere classified: Secondary | ICD-10-CM | POA: Insufficient documentation

## 2017-11-30 DIAGNOSIS — C182 Malignant neoplasm of ascending colon: Secondary | ICD-10-CM

## 2017-11-30 DIAGNOSIS — R5383 Other fatigue: Secondary | ICD-10-CM

## 2017-11-30 DIAGNOSIS — D649 Anemia, unspecified: Secondary | ICD-10-CM | POA: Diagnosis not present

## 2017-11-30 DIAGNOSIS — R110 Nausea: Secondary | ICD-10-CM | POA: Diagnosis not present

## 2017-11-30 LAB — CBC WITH DIFFERENTIAL/PLATELET
BASOS ABS: 0 10*3/uL (ref 0–0.1)
Basophils Relative: 1 %
EOS PCT: 1 %
Eosinophils Absolute: 0 10*3/uL (ref 0–0.7)
HEMATOCRIT: 30 % — AB (ref 35.0–47.0)
Hemoglobin: 10.1 g/dL — ABNORMAL LOW (ref 12.0–16.0)
LYMPHS ABS: 1 10*3/uL (ref 1.0–3.6)
LYMPHS PCT: 41 %
MCH: 30.9 pg (ref 26.0–34.0)
MCHC: 33.7 g/dL (ref 32.0–36.0)
MCV: 91.7 fL (ref 80.0–100.0)
MONO ABS: 0.3 10*3/uL (ref 0.2–0.9)
Monocytes Relative: 14 %
NEUTROS ABS: 1.1 10*3/uL — AB (ref 1.4–6.5)
Neutrophils Relative %: 43 %
Platelets: 296 10*3/uL (ref 150–440)
RBC: 3.27 MIL/uL — ABNORMAL LOW (ref 3.80–5.20)
RDW: 21.5 % — AB (ref 11.5–14.5)
WBC: 2.4 10*3/uL — ABNORMAL LOW (ref 3.6–11.0)

## 2017-11-30 LAB — COMPREHENSIVE METABOLIC PANEL
ALT: 18 U/L (ref 14–54)
ANION GAP: 7 (ref 5–15)
AST: 30 U/L (ref 15–41)
Albumin: 3.8 g/dL (ref 3.5–5.0)
Alkaline Phosphatase: 174 U/L — ABNORMAL HIGH (ref 38–126)
BUN: 20 mg/dL (ref 6–20)
CHLORIDE: 108 mmol/L (ref 101–111)
CO2: 21 mmol/L — AB (ref 22–32)
Calcium: 9.2 mg/dL (ref 8.9–10.3)
Creatinine, Ser: 1.21 mg/dL — ABNORMAL HIGH (ref 0.44–1.00)
GFR calc Af Amer: 52 mL/min — ABNORMAL LOW (ref >60)
GFR calc non Af Amer: 45 mL/min — ABNORMAL LOW (ref >60)
GLUCOSE: 107 mg/dL — AB (ref 65–99)
Potassium: 4.1 mmol/L (ref 3.5–5.1)
SODIUM: 136 mmol/L (ref 135–145)
TOTAL PROTEIN: 7.5 g/dL (ref 6.5–8.1)
Total Bilirubin: 0.4 mg/dL (ref 0.3–1.2)

## 2017-11-30 MED ORDER — SODIUM CHLORIDE 0.9% FLUSH
10.0000 mL | Freq: Once | INTRAVENOUS | Status: AC
  Administered 2017-11-30: 10 mL via INTRAVENOUS
  Filled 2017-11-30: qty 10

## 2017-11-30 MED ORDER — HEPARIN SOD (PORK) LOCK FLUSH 100 UNIT/ML IV SOLN
500.0000 [IU] | Freq: Once | INTRAVENOUS | Status: AC
  Administered 2017-11-30: 500 [IU] via INTRAVENOUS

## 2017-11-30 NOTE — Progress Notes (Signed)
Pt in for follow up, states "nervous today", reports starting 2nd cycle of Lonsurf today.

## 2017-12-01 ENCOUNTER — Telehealth: Payer: Self-pay | Admitting: *Deleted

## 2017-12-01 LAB — CEA: CEA: 2777 ng/mL — ABNORMAL HIGH (ref 0.0–4.7)

## 2017-12-01 NOTE — Telephone Encounter (Signed)
-----   Message from Sabino Gasser, RN sent at 12/01/2017 12:10 PM EDT ----- Rn to contact patient with cea results

## 2017-12-01 NOTE — Telephone Encounter (Signed)
Spoke with patient regarding CEA results. Cea results are rising - 2,777.0. I spoke with Dr. Grayland Ormond before contacting the patient. This is an anticipated rise. Patient has not been on lonsurf long enough. Will continue current tx plan for now. Recheck cea in 2 weeks..   Pt thanked me for calling her.  My chart msg sent to the patient.

## 2017-12-13 ENCOUNTER — Other Ambulatory Visit: Payer: Self-pay | Admitting: Internal Medicine

## 2017-12-13 NOTE — Telephone Encounter (Signed)
   Ref Range & Units 13d ago  Potassium 3.5 - 5.1 mmol/L 4.1

## 2017-12-14 ENCOUNTER — Inpatient Hospital Stay: Payer: Medicare HMO

## 2017-12-15 ENCOUNTER — Telehealth: Payer: Self-pay | Admitting: *Deleted

## 2017-12-15 ENCOUNTER — Inpatient Hospital Stay: Payer: Medicare HMO

## 2017-12-15 DIAGNOSIS — C182 Malignant neoplasm of ascending colon: Secondary | ICD-10-CM

## 2017-12-15 DIAGNOSIS — C799 Secondary malignant neoplasm of unspecified site: Secondary | ICD-10-CM

## 2017-12-15 DIAGNOSIS — C189 Malignant neoplasm of colon, unspecified: Secondary | ICD-10-CM

## 2017-12-15 DIAGNOSIS — R970 Elevated carcinoembryonic antigen [CEA]: Secondary | ICD-10-CM

## 2017-12-15 LAB — COMPREHENSIVE METABOLIC PANEL
ALBUMIN: 3.8 g/dL (ref 3.5–5.0)
ALK PHOS: 152 U/L — AB (ref 38–126)
ALT: 16 U/L (ref 0–44)
AST: 22 U/L (ref 15–41)
Anion gap: 6 (ref 5–15)
BILIRUBIN TOTAL: 0.6 mg/dL (ref 0.3–1.2)
BUN: 18 mg/dL (ref 8–23)
CO2: 21 mmol/L — ABNORMAL LOW (ref 22–32)
CREATININE: 1.29 mg/dL — AB (ref 0.44–1.00)
Calcium: 8.8 mg/dL — ABNORMAL LOW (ref 8.9–10.3)
Chloride: 110 mmol/L (ref 98–111)
GFR calc Af Amer: 49 mL/min — ABNORMAL LOW (ref >60)
GFR, EST NON AFRICAN AMERICAN: 42 mL/min — AB (ref >60)
Glucose, Bld: 104 mg/dL — ABNORMAL HIGH (ref 70–99)
POTASSIUM: 3.9 mmol/L (ref 3.5–5.1)
Sodium: 137 mmol/L (ref 135–145)
TOTAL PROTEIN: 7.2 g/dL (ref 6.5–8.1)

## 2017-12-15 LAB — CBC WITH DIFFERENTIAL/PLATELET
BASOS ABS: 0 10*3/uL (ref 0–0.1)
Basophils Relative: 2 %
EOS ABS: 0 10*3/uL (ref 0–0.7)
EOS PCT: 1 %
HCT: 26 % — ABNORMAL LOW (ref 35.0–47.0)
Hemoglobin: 8.9 g/dL — ABNORMAL LOW (ref 12.0–16.0)
LYMPHS ABS: 0.4 10*3/uL — AB (ref 1.0–3.6)
LYMPHS PCT: 51 %
MCH: 31.8 pg (ref 26.0–34.0)
MCHC: 34.4 g/dL (ref 32.0–36.0)
MCV: 92.4 fL (ref 80.0–100.0)
MONO ABS: 0 10*3/uL — AB (ref 0.2–0.9)
Monocytes Relative: 0 %
Neutro Abs: 0.4 10*3/uL — ABNORMAL LOW (ref 1.4–6.5)
Neutrophils Relative %: 46 %
PLATELETS: 118 10*3/uL — AB (ref 150–440)
RBC: 2.82 MIL/uL — AB (ref 3.80–5.20)
RDW: 20.7 % — AB (ref 11.5–14.5)
WBC: 0.8 10*3/uL — AB (ref 3.6–11.0)

## 2017-12-15 NOTE — Telephone Encounter (Signed)
Penny from cancer center lab called [place] critical 0.4. Per Dr. Rogue Bussing - confirmed with patient that she currently not taking Lonsurf as this is her week off.  Per md - pt was educated on Neutropenic precautions.

## 2017-12-16 ENCOUNTER — Telehealth: Payer: Self-pay | Admitting: Internal Medicine

## 2017-12-16 LAB — CEA: CEA1: 2529 ng/mL — AB (ref 0.0–4.7)

## 2017-12-16 NOTE — Telephone Encounter (Signed)
Left message for pt re; improved CEA; infectious precautions; follow upas planned. Dr.B

## 2017-12-30 ENCOUNTER — Inpatient Hospital Stay (HOSPITAL_BASED_OUTPATIENT_CLINIC_OR_DEPARTMENT_OTHER): Payer: Medicare HMO | Admitting: Internal Medicine

## 2017-12-30 ENCOUNTER — Telehealth: Payer: Self-pay | Admitting: Internal Medicine

## 2017-12-30 ENCOUNTER — Other Ambulatory Visit: Payer: Self-pay

## 2017-12-30 ENCOUNTER — Inpatient Hospital Stay: Payer: Medicare HMO | Attending: Internal Medicine

## 2017-12-30 VITALS — BP 149/93 | HR 65 | Temp 98.2°F | Resp 20

## 2017-12-30 DIAGNOSIS — T451X5S Adverse effect of antineoplastic and immunosuppressive drugs, sequela: Secondary | ICD-10-CM | POA: Diagnosis not present

## 2017-12-30 DIAGNOSIS — Z79899 Other long term (current) drug therapy: Secondary | ICD-10-CM | POA: Diagnosis not present

## 2017-12-30 DIAGNOSIS — Z9221 Personal history of antineoplastic chemotherapy: Secondary | ICD-10-CM | POA: Insufficient documentation

## 2017-12-30 DIAGNOSIS — R110 Nausea: Secondary | ICD-10-CM | POA: Insufficient documentation

## 2017-12-30 DIAGNOSIS — C799 Secondary malignant neoplasm of unspecified site: Secondary | ICD-10-CM

## 2017-12-30 DIAGNOSIS — M1990 Unspecified osteoarthritis, unspecified site: Secondary | ICD-10-CM | POA: Insufficient documentation

## 2017-12-30 DIAGNOSIS — N183 Chronic kidney disease, stage 3 (moderate): Secondary | ICD-10-CM | POA: Diagnosis not present

## 2017-12-30 DIAGNOSIS — D500 Iron deficiency anemia secondary to blood loss (chronic): Secondary | ICD-10-CM | POA: Diagnosis not present

## 2017-12-30 DIAGNOSIS — D702 Other drug-induced agranulocytosis: Secondary | ICD-10-CM

## 2017-12-30 DIAGNOSIS — Z8719 Personal history of other diseases of the digestive system: Secondary | ICD-10-CM

## 2017-12-30 DIAGNOSIS — D631 Anemia in chronic kidney disease: Secondary | ICD-10-CM | POA: Diagnosis not present

## 2017-12-30 DIAGNOSIS — I129 Hypertensive chronic kidney disease with stage 1 through stage 4 chronic kidney disease, or unspecified chronic kidney disease: Secondary | ICD-10-CM

## 2017-12-30 DIAGNOSIS — R5383 Other fatigue: Secondary | ICD-10-CM

## 2017-12-30 DIAGNOSIS — C182 Malignant neoplasm of ascending colon: Secondary | ICD-10-CM

## 2017-12-30 DIAGNOSIS — E669 Obesity, unspecified: Secondary | ICD-10-CM

## 2017-12-30 DIAGNOSIS — R5381 Other malaise: Secondary | ICD-10-CM | POA: Insufficient documentation

## 2017-12-30 DIAGNOSIS — R630 Anorexia: Secondary | ICD-10-CM | POA: Diagnosis not present

## 2017-12-30 DIAGNOSIS — C189 Malignant neoplasm of colon, unspecified: Secondary | ICD-10-CM

## 2017-12-30 DIAGNOSIS — E500 Vitamin A deficiency with conjunctival xerosis: Secondary | ICD-10-CM

## 2017-12-30 DIAGNOSIS — C787 Secondary malignant neoplasm of liver and intrahepatic bile duct: Secondary | ICD-10-CM | POA: Diagnosis not present

## 2017-12-30 DIAGNOSIS — R197 Diarrhea, unspecified: Secondary | ICD-10-CM | POA: Insufficient documentation

## 2017-12-30 DIAGNOSIS — R970 Elevated carcinoembryonic antigen [CEA]: Secondary | ICD-10-CM

## 2017-12-30 DIAGNOSIS — R634 Abnormal weight loss: Secondary | ICD-10-CM | POA: Diagnosis not present

## 2017-12-30 LAB — COMPREHENSIVE METABOLIC PANEL
ALBUMIN: 3.8 g/dL (ref 3.5–5.0)
ALT: 16 U/L (ref 0–44)
AST: 27 U/L (ref 15–41)
Alkaline Phosphatase: 152 U/L — ABNORMAL HIGH (ref 38–126)
Anion gap: 8 (ref 5–15)
BUN: 13 mg/dL (ref 8–23)
CHLORIDE: 109 mmol/L (ref 98–111)
CO2: 20 mmol/L — AB (ref 22–32)
Calcium: 8.7 mg/dL — ABNORMAL LOW (ref 8.9–10.3)
Creatinine, Ser: 1.29 mg/dL — ABNORMAL HIGH (ref 0.44–1.00)
GFR calc Af Amer: 49 mL/min — ABNORMAL LOW (ref >60)
GFR calc non Af Amer: 42 mL/min — ABNORMAL LOW (ref >60)
GLUCOSE: 97 mg/dL (ref 70–99)
Potassium: 4.3 mmol/L (ref 3.5–5.1)
SODIUM: 137 mmol/L (ref 135–145)
Total Bilirubin: 0.4 mg/dL (ref 0.3–1.2)
Total Protein: 7.2 g/dL (ref 6.5–8.1)

## 2017-12-30 LAB — CBC WITH DIFFERENTIAL/PLATELET
Basophils Absolute: 0 10*3/uL (ref 0–0.1)
Basophils Relative: 0 %
EOS PCT: 0 %
Eosinophils Absolute: 0 10*3/uL (ref 0–0.7)
HCT: 26.6 % — ABNORMAL LOW (ref 35.0–47.0)
Hemoglobin: 9.2 g/dL — ABNORMAL LOW (ref 12.0–16.0)
LYMPHS ABS: 0.6 10*3/uL — AB (ref 1.0–3.6)
LYMPHS PCT: 27 %
MCH: 33.5 pg (ref 26.0–34.0)
MCHC: 34.6 g/dL (ref 32.0–36.0)
MCV: 96.7 fL (ref 80.0–100.0)
MONOS PCT: 12 %
Monocytes Absolute: 0.3 10*3/uL (ref 0.2–0.9)
Neutro Abs: 1.3 10*3/uL — ABNORMAL LOW (ref 1.4–6.5)
Neutrophils Relative %: 61 %
Platelets: 243 10*3/uL (ref 150–440)
RBC: 2.75 MIL/uL — AB (ref 3.80–5.20)
RDW: 24 % — ABNORMAL HIGH (ref 11.5–14.5)
WBC: 2.2 10*3/uL — AB (ref 3.6–11.0)

## 2017-12-30 NOTE — Progress Notes (Signed)
[facility] OFFICE PROGRESS NOTE  [name], [name], MD (Internal Medicine) [name], [name], RN as Registered Nurse [name], MD as Consulting Physician (Internal Medicine)  Cancer Staging No matching staging information was found for the patient.   Oncology History   # MARCH/APRIL 2017-NEUROENDOCRINE CA STAGE IV; [name]; [name]; [name]; [name]; [name]; [name]; [name]; July 14th  2017 CT- PR  # AUG 28th- FOLFIRI +Avastin; OCT 5th CT scan- PR.   # MARCH 20th- CT STABLE liver lesions; ? Enlarging cecal lesion [CEA rising]; March 26th- FOLFOX [last avastin march 12th 2018];   # resection of primary tumor [Dr.Byrnett]; y90- June 19th 2018.  # July 11th-FOLFOX + Avastin; SEP 2018 [CT- stable liver lesions; increasing ~26mm RP LN]; RISING CEA [stopped end of Sep 2018 ]  # OCT 15th 2018- Vemu+ Iri+ Pan [mid nov stopped iri- neutropenia/dia+vemu- arthralgias]; 05/11/2017-Started enco+Bini+vectibex; April 28th PET- PROGRESSION  #    # acute lower GIB-EGD/ colonoscopy [Dr.Anna]- - ulcerated Lesion along the anastomotic line;  biopsy negative for recurrence.    # [facility]- B-RAF V600E- MUTATED [May 6213]  # right kidney lesion 2.6 x 1.6 cm ? 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  female pleasant patient above history of B-raF positive neuroendocrine/adenocarcinoma: Currently on Lonsurf is here for follow-up.  Patient denies any abdominal pain;vomiting.  She does complain of fatigue nausea diarrhea up to 3-4 loose stools a day during the third week of treatment.  Symptoms improved with Imodium.  Patient denies any sores in the mouth.  Denies any rash.  No fever chills.  Today patient feels good.  Review of Systems  Constitutional: Positive for malaise/fatigue. Negative for chills, diaphoresis, fever and weight loss.  HENT: Negative for nosebleeds and sore throat.   Eyes: Negative for double vision.  Respiratory: Negative for cough, hemoptysis, sputum production, shortness of breath and wheezing.   Cardiovascular: Negative for chest pain, palpitations, orthopnea and leg swelling.  Gastrointestinal: Positive for diarrhea and nausea. Negative for abdominal pain, blood in stool, constipation, heartburn, melena and vomiting.  Genitourinary: Negative for dysuria, frequency and urgency.  Musculoskeletal: Negative for back pain and joint pain.  Skin: Negative.  Negative for itching and rash.  Neurological: Negative for dizziness, tingling, focal weakness, weakness and headaches.  Endo/Heme/Allergies: Does not bruise/bleed easily.  Psychiatric/Behavioral: Negative for depression. The patient is not nervous/anxious and does not have insomnia.       PAST MEDICAL HISTORY :  Past Medical History:  Diagnosis Date  . Angiolipoma of kidney    followed with serial CTs ,,  Dr. Jacqlyn Larsen  . Anxiety   . Arthritis   . 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 Chemotherapy induced diarrhea   . Hammer toe   . Hypertension   .  Liver cancer ([place])   . Neuro-endocrine carcinoma ([place])   . Obesity (BMI 30-39.9)     PAST SURGICAL HISTORY :   Past Surgical History:  Procedure Laterality Date  .  ABDOMINAL HYSTERECTOMY  1995   endometriosis, heavy bleeding  . BUNIONECTOMY Bilateral   . CESAREAN SECTION  1985  . COLONOSCOPY WITH PROPOFOL N/A 10/09/2015   Procedure: COLONOSCOPY WITH PROPOFOL;  Surgeon: Robert Bellow, MD;  Location: [facility] ENDOSCOPY;  Service: Endoscopy;  Laterality: N/A;  . COLONOSCOPY WITH PROPOFOL N/A 09/07/2017   Procedure: COLONOSCOPY WITH PROPOFOL;  Surgeon: Jonathon Bellows, MD;  Location: [facility] ENDOSCOPY;  Service: Gastroenterology;  Laterality: N/A;  . ESOPHAGOGASTRODUODENOSCOPY (EGD) WITH PROPOFOL N/A 09/07/2017   Procedure: ESOPHAGOGASTRODUODENOSCOPY (EGD) WITH PROPOFOL;  Surgeon: Jonathon Bellows, MD;  Location: [facility] ENDOSCOPY;  Service: Gastroenterology;  Laterality: N/A;  . IR ANGIOGRAM SELECTIVE EACH ADDITIONAL VESSEL  11/22/2016  . IR ANGIOGRAM SELECTIVE EACH ADDITIONAL VESSEL  11/22/2016  . IR ANGIOGRAM SELECTIVE EACH ADDITIONAL VESSEL  11/22/2016  . IR ANGIOGRAM SELECTIVE EACH ADDITIONAL VESSEL  12/07/2016  . IR ANGIOGRAM SELECTIVE EACH ADDITIONAL VESSEL  12/07/2016  . IR ANGIOGRAM VISCERAL SELECTIVE  11/22/2016  . IR ANGIOGRAM VISCERAL SELECTIVE  12/07/2016  . IR EMBO ARTERIAL NOT HEMORR HEMANG INC GUIDE ROADMAPPING  11/22/2016  . IR EMBO TUMOR ORGAN ISCHEMIA INFARCT INC GUIDE ROADMAPPING  12/07/2016  . IR GENERIC HISTORICAL  04/07/2016   IR RADIOLOGIST EVAL & MGMT 04/07/2016 Arne Cleveland, MD [facility] INTERV RAD  . IR RADIOLOGIST EVAL & MGMT  04/14/2017  . IR US GUIDE VASC ACCESS RIGHT  11/22/2016  . IR US GUIDE VASC ACCESS RIGHT  12/07/2016  . LAPAROSCOPIC RIGHT COLECTOMY Right 10/26/2016   Procedure: LAPAROSCOPIC RIGHT COLECTOMY;  Surgeon: Robert Bellow, MD;  Location: [facility] ORS;  Service: General;  Laterality: Right;  . OOPHORECTOMY    . PORTACATH PLACEMENT Left 10/01/2015   Procedure: INSERTION PORT-A-CATH;  Surgeon: Robert Bellow, MD;  Location: [facility] ORS;  Service: General;  Laterality: Left;  . ROTATOR CUFF REPAIR Right 2008   right shoulder.  [place]     FAMILY  HISTORY :   Family History  Problem Relation Age of Onset  . Mental illness Mother 67       bipolar, dementia  . Cancer Maternal Aunt 70       breast ca  . Hypertension Father   . Stroke Father 5       deceased  . COPD Father   . Heart disease Sister        deceased    SOCIAL HISTORY:   Social History   Tobacco Use  . Smoking status: Never Smoker  . Smokeless tobacco: Never Used  Substance Use Topics  . Alcohol use: Yes    Alcohol/week: 0.0 oz    Comment: occasionally  . Drug use: No    ALLERGIES:  is allergic to bee venom; dilaudid [hydromorphone hcl]; and morphine and related.  MEDICATIONS:  Current Outpatient Medications  Medication Sig Dispense Refill  . ALPRAZolam (XANAX) 0.25 MG tablet Take 1 tablet (0.25 mg total) by mouth 2 (two) times daily as needed for anxiety. 60 tablet 3  . Biotin w/ Vitamins C & E (HAIR/SKIN/NAILS PO) Take 1 tablet by mouth daily.    . Chlorpheniramine Maleate (CHLOR-TABLETS PO) Take 1 tablet by mouth every evening.    . diphenoxylate-atropine (LOMOTIL) 2.5-0.025 MG tablet TAKE 1 TABLET FOUR TIMES DAILY AS NEEDEDFOR DIARRHEA OR LOOSE STOOLS 30 tablet 3  .  lidocaine-prilocaine (EMLA) cream Apply 1 application topically as needed. Apply to port a cath site 1 hour prior to chemotherapy treatments 30 g 6  . loperamide (IMODIUM A-D) 2 MG tablet Take 2 mg by mouth 4 (four) times daily as needed for diarrhea or loose stools.    . metoprolol tartrate (LOPRESSOR) 25 MG tablet Take 1 tablet (25 mg total) by mouth 2 (two) times daily. 60 tablet 6  . ondansetron (ZOFRAN) 8 MG tablet Take 1 tablet (8 mg total) by mouth every 8 (eight) hours as needed for nausea or vomiting. 40 tablet 3  . pantoprazole (PROTONIX) 40 MG tablet Take 1 tablet (40 mg total) by mouth daily. 30 tablet 1  . potassium chloride (K-DUR) 10 MEQ tablet TAKE ONE TABLET BY MOUTH TWICE A DAY. 60 tablet 3  . prochlorperazine (COMPAZINE) 10 MG tablet TAKE ONE TABLET BY MOUTH EVERY 6 HOURS  AS NEEDED FOR NAUSEA OR VOMITING 30 tablet 2  . triamcinolone ointment (KENALOG) 0.5 % Apply 1 application topically 2 (two) times daily. To rash 30 g 0  . trifluridine-tipiracil (LONSURF) 15-6.14 MG tablet Take 2 tablets by mouth twice daily, 1 hr after AM & PM meals on days 1-5, 8-12. Take with two Lonsurf 20-8.19 mg tablets. Repeat every 28d 40 tablet 4  . trifluridine-tipiracil (LONSURF) 20-8.19 MG tablet Take 2 tablets by mouth twice daily, 1 hr after AM & PM meals on days 1-5, 8-12. Take with two Lonsurf 15-6.14 mg tablets. Repeat every 28d 40 tablet 4   No current facility-administered medications for this visit.    Facility-Administered Medications Ordered in Other Visits  Medication Dose Route Frequency Provider Last Rate Last Dose  . heparin lock flush 100 unit/mL  500 Units Intravenous Once Charlaine Dalton R, MD      . sodium chloride flush (NS) 0.9 % injection 10 mL  10 mL Intravenous PRN [name], MD   10 mL at 11/24/15 0836    PHYSICAL EXAMINATION: ECOG PERFORMANCE STATUS: 1 - Symptomatic but completely ambulatory  BP (!) 149/93   Pulse 65   Temp 98.2 F (36.8 C) (Tympanic)   Resp 20   There were no vitals filed for this visit.  GENERAL: Well-nourished well-developed; Alert, no distress and comfortable.  Accompanied by family.  EYES: no pallor or icterus OROPHARYNX: no thrush or ulceration; NECK: supple; no lymph nodes felt. LYMPH:  no palpable lymphadenopathy in the axillary or inguinal regions LUNGS: Decreased breath sounds auscultation bilaterally. No wheeze or crackles HEART/CVS: regular rate & rhythm and no murmurs; No lower extremity edema ABDOMEN:abdomen soft, non-tender and normal bowel sounds. No hepatomegaly or splenomegaly.  Musculoskeletal:no cyanosis of digits and no clubbing  PSYCH: alert & oriented x 3 with fluent speech NEURO: no focal motor/sensory deficits SKIN:  no rashes or significant lesions    LABORATORY DATA:  I have  reviewed the data as listed    Component Value Date/Time   NA 137 12/30/2017 1043   NA 138 11/04/2016 1009   K 4.3 12/30/2017 1043   CL 109 12/30/2017 1043   CO2 20 (L) 12/30/2017 1043   GLUCOSE 97 12/30/2017 1043   BUN 13 12/30/2017 1043   BUN 14 11/04/2016 1009   CREATININE 1.29 (H) 12/30/2017 1043   CALCIUM 8.7 (L) 12/30/2017 1043   PROT 7.2 12/30/2017 1043   PROT 7.9 11/04/2016 1009   ALBUMIN 3.8 12/30/2017 1043   ALBUMIN 4.2 11/04/2016 1009   AST 27 12/30/2017 1043   ALT 16  12/30/2017 1043   ALKPHOS 152 (H) 12/30/2017 1043   BILITOT 0.4 12/30/2017 1043   BILITOT 0.3 11/04/2016 1009   GFRNONAA 42 (L) 12/30/2017 1043   GFRAA 49 (L) 12/30/2017 1043    No results found for: SPEP, UPEP  Lab Results  Component Value Date   WBC 2.2 (L) 12/30/2017   NEUTROABS 1.3 (L) 12/30/2017   HGB 9.2 (L) 12/30/2017   HCT 26.6 (L) 12/30/2017   MCV 96.7 12/30/2017   PLT 243 12/30/2017      Chemistry      Component Value Date/Time   NA 137 12/30/2017 1043   NA 138 11/04/2016 1009   K 4.3 12/30/2017 1043   CL 109 12/30/2017 1043   CO2 20 (L) 12/30/2017 1043   BUN 13 12/30/2017 1043   BUN 14 11/04/2016 1009   CREATININE 1.29 (H) 12/30/2017 1043      Component Value Date/Time   CALCIUM 8.7 (L) 12/30/2017 1043   ALKPHOS 152 (H) 12/30/2017 1043   AST 27 12/30/2017 1043   ALT 16 12/30/2017 1043   BILITOT 0.4 12/30/2017 1043   BILITOT 0.3 11/04/2016 1009       RADIOGRAPHIC STUDIES: I have personally reviewed the radiological images as listed and agreed with the findings in the report. No results found.   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  Currently on Lonsurf tolerating fairly well-mild-moderate side effects see discussion below  #Continue Lonsurf; currently cycle number 3 day 3; improving CEA; tolerating well except [ see discussion  below]; we will plan to get a PET scan after this cycle/approximately 1 month.  #Drug-induced neutropenia-secondary Lonsurf; today ANC-1.3. Would not recommend dose reductions.  Discussed neutropenia as possible predictor of treatment response.  Would recommend Granix if patient severely neutropenic-in approximately 2 weeks. If ANC less than 1500; please Granix x 5 days.   # Diarrhea/nausea sec  Lonsurf; G-12; recommend continued Zofran/lomotil; recommend diet/pro-bitics.   #Anemia multifactorial history of GI bleed/CKD-Lonsurf.  Hemoglobin 9-10 stable.  # CKD- III/ 1.2/ stable.   # follow up in 2 week/cbc/granix; follow up in 4 weeks/CBC/CMP/CEA. PET scan in 4 week.    Orders Placed This Encounter  Procedures  . NM PET Image Restag (PS) Skull Base To Thigh    Standing Status:   Future    Standing Expiration Date:   12/30/2018    Order Specific Question:   ** REASON FOR EXAM (FREE TEXT)    Answer:   colon cancer; metastatic to liver on chemo    Order Specific Question:   If indicated for the ordered procedure, I authorize the administration of a radiopharmaceutical per Radiology protocol    Answer:   Yes    Order Specific Question:   Preferred imaging location?    Answer:   [facility]    Order Specific Question:   Radiology Contrast Protocol - do NOT remove file path    Answer:   [email or web address]  . CBC with Differential    Standing Status:   Future    Number of Occurrences:   1    Standing Expiration Date:   12/30/2018  . Comprehensive metabolic panel    Standing Status:   Future    Number of Occurrences:   1    Standing Expiration Date:   12/30/2018   All questions were answered. The patient knows to call the clinic with  any problems, questions or concerns.      [name], MD 12/30/2017 9:16 PM

## 2017-12-30 NOTE — Assessment & Plan Note (Addendum)
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  Currently on Lonsurf tolerating fairly well-mild-moderate side effects see discussion below  #Continue Lonsurf; currently cycle number 3 day 3; improving CEA; tolerating well except [ see discussion below]; we will plan to get a PET scan after this cycle/approximately 1 month.  #Drug-induced neutropenia-secondary Lonsurf; today ANC-1.3. Would not recommend dose reductions.  Discussed neutropenia as possible predictor of treatment response.  Would recommend Granix if patient severely neutropenic-in approximately 2 weeks. If ANC less than 1500; please Granix x 5 days.   # Diarrhea/nausea sec  Lonsurf; G-12; recommend continued Zofran/lomotil; recommend diet/pro-bitics.   #Anemia multifactorial history of GI bleed/CKD-Lonsurf.  Hemoglobin 9-10 stable.  # CKD- III/ 1.2/ stable.   # follow up in 2 week/cbc/granix; follow up in 4 weeks/CBC/CMP/CEA. PET scan in 4 week.

## 2017-12-30 NOTE — Telephone Encounter (Signed)
Heather/collete- please schedule pt for daily granix x 5 days starting 7/26 [excluding sat/sun] If ANC less than 1500 [most likley pt's [place] will be less than 1500 on that day].  Also inform pre-authorization team  Thx GB

## 2017-12-31 LAB — CEA: CEA1: 2976 ng/mL — AB (ref 0.0–4.7)

## 2018-01-01 ENCOUNTER — Telehealth: Payer: Self-pay | Admitting: Internal Medicine

## 2018-01-01 NOTE — Telephone Encounter (Signed)
Kathleen James/brooke- please inform pt that her CEA is going up; so would like to recommend PET scan sooner as recommended.  Please schedule the PET scan in the early part week of 29th July- and follow up with me 1-2 days after the PET.   Thx GB

## 2018-01-02 NOTE — Telephone Encounter (Signed)
Pet to be scheduled. Pt aware.

## 2018-01-03 ENCOUNTER — Other Ambulatory Visit: Payer: Self-pay | Admitting: Internal Medicine

## 2018-01-03 DIAGNOSIS — T451X5A Adverse effect of antineoplastic and immunosuppressive drugs, initial encounter: Secondary | ICD-10-CM

## 2018-01-03 DIAGNOSIS — C189 Malignant neoplasm of colon, unspecified: Secondary | ICD-10-CM

## 2018-01-03 DIAGNOSIS — C182 Malignant neoplasm of ascending colon: Secondary | ICD-10-CM

## 2018-01-03 DIAGNOSIS — C799 Secondary malignant neoplasm of unspecified site: Principal | ICD-10-CM

## 2018-01-03 DIAGNOSIS — R112 Nausea with vomiting, unspecified: Secondary | ICD-10-CM

## 2018-01-03 DIAGNOSIS — K521 Toxic gastroenteritis and colitis: Secondary | ICD-10-CM

## 2018-01-05 ENCOUNTER — Encounter: Payer: Self-pay | Admitting: *Deleted

## 2018-01-05 ENCOUNTER — Encounter: Payer: Self-pay | Admitting: Internal Medicine

## 2018-01-05 ENCOUNTER — Other Ambulatory Visit: Payer: Self-pay | Admitting: Internal Medicine

## 2018-01-05 ENCOUNTER — Other Ambulatory Visit: Payer: Self-pay | Admitting: *Deleted

## 2018-01-05 DIAGNOSIS — C182 Malignant neoplasm of ascending colon: Secondary | ICD-10-CM

## 2018-01-05 DIAGNOSIS — T451X5A Adverse effect of antineoplastic and immunosuppressive drugs, initial encounter: Secondary | ICD-10-CM

## 2018-01-05 DIAGNOSIS — C189 Malignant neoplasm of colon, unspecified: Secondary | ICD-10-CM

## 2018-01-05 DIAGNOSIS — C799 Secondary malignant neoplasm of unspecified site: Principal | ICD-10-CM

## 2018-01-05 DIAGNOSIS — K521 Toxic gastroenteritis and colitis: Secondary | ICD-10-CM

## 2018-01-05 DIAGNOSIS — R112 Nausea with vomiting, unspecified: Secondary | ICD-10-CM

## 2018-01-05 MED ORDER — ONDANSETRON HCL 8 MG PO TABS: 8.0000 mg | ORAL_TABLET | Freq: Three times a day (TID) | ORAL | 3 refills | Status: AC | PRN

## 2018-01-05 MED ORDER — DIPHENOXYLATE-ATROPINE 2.5-0.025 MG PO TABS: 1.0000 | ORAL_TABLET | Freq: Four times a day (QID) | ORAL | 0 refills | Status: AC | PRN

## 2018-01-05 NOTE — Telephone Encounter (Signed)
Prior auth for zofran submitted.

## 2018-01-05 NOTE — Telephone Encounter (Signed)
Patient sent my chart msg requesting RF on zofran and lomotil.

## 2018-01-13 ENCOUNTER — Other Ambulatory Visit: Payer: Self-pay | Admitting: Internal Medicine

## 2018-01-13 ENCOUNTER — Inpatient Hospital Stay: Payer: Medicare HMO

## 2018-01-13 ENCOUNTER — Other Ambulatory Visit: Payer: Self-pay

## 2018-01-13 DIAGNOSIS — C182 Malignant neoplasm of ascending colon: Secondary | ICD-10-CM

## 2018-01-13 LAB — COMPREHENSIVE METABOLIC PANEL
ALK PHOS: 161 U/L — AB (ref 38–126)
ALT: 17 U/L (ref 0–44)
ANION GAP: 6 (ref 5–15)
AST: 24 U/L (ref 15–41)
Albumin: 3.7 g/dL (ref 3.5–5.0)
BUN: 16 mg/dL (ref 8–23)
CALCIUM: 8.9 mg/dL (ref 8.9–10.3)
CO2: 22 mmol/L (ref 22–32)
CREATININE: 1.16 mg/dL — AB (ref 0.44–1.00)
Chloride: 108 mmol/L (ref 98–111)
GFR calc non Af Amer: 48 mL/min — ABNORMAL LOW (ref >60)
GFR, EST AFRICAN AMERICAN: 55 mL/min — AB (ref >60)
Glucose, Bld: 104 mg/dL — ABNORMAL HIGH (ref 70–99)
Potassium: 4.5 mmol/L (ref 3.5–5.1)
SODIUM: 136 mmol/L (ref 135–145)
TOTAL PROTEIN: 7.1 g/dL (ref 6.5–8.1)
Total Bilirubin: 0.6 mg/dL (ref 0.3–1.2)

## 2018-01-13 LAB — CBC WITH DIFFERENTIAL/PLATELET
Basophils Absolute: 0 10*3/uL (ref 0–0.1)
Basophils Relative: 1 %
Eosinophils Absolute: 0 10*3/uL (ref 0–0.7)
Eosinophils Relative: 1 %
HCT: 22.5 % — ABNORMAL LOW (ref 35.0–47.0)
Hemoglobin: 7.7 g/dL — ABNORMAL LOW (ref 12.0–16.0)
Lymphocytes Relative: 51 %
Lymphs Abs: 0.4 10*3/uL — ABNORMAL LOW (ref 1.0–3.6)
MCH: 33.2 pg (ref 26.0–34.0)
MCHC: 34.2 g/dL (ref 32.0–36.0)
MCV: 96.9 fL (ref 80.0–100.0)
Monocytes Absolute: 0 10*3/uL — ABNORMAL LOW (ref 0.2–0.9)
Monocytes Relative: 1 %
NEUTROS ABS: 0.4 10*3/uL — AB (ref 1.4–6.5)
NEUTROS PCT: 46 %
PLATELETS: 105 10*3/uL — AB (ref 150–440)
RBC: 2.33 MIL/uL — ABNORMAL LOW (ref 3.80–5.20)
RDW: 22.8 % — AB (ref 11.5–14.5)
WBC: 0.8 10*3/uL — AB (ref 3.6–11.0)

## 2018-01-13 MED ORDER — TBO-FILGRASTIM 480 MCG/0.8ML ~~LOC~~ SOSY
480.0000 ug | PREFILLED_SYRINGE | Freq: Once | SUBCUTANEOUS | Status: AC
  Administered 2018-01-13: 480 ug via SUBCUTANEOUS

## 2018-01-14 LAB — CEA: CEA1: 3789 ng/mL — AB (ref 0.0–4.7)

## 2018-01-16 ENCOUNTER — Inpatient Hospital Stay: Payer: Medicare HMO

## 2018-01-16 DIAGNOSIS — C182 Malignant neoplasm of ascending colon: Secondary | ICD-10-CM

## 2018-01-16 MED ORDER — TBO-FILGRASTIM 480 MCG/0.8ML ~~LOC~~ SOSY
480.0000 ug | PREFILLED_SYRINGE | Freq: Once | SUBCUTANEOUS | Status: AC
  Administered 2018-01-16: 480 ug via SUBCUTANEOUS

## 2018-01-17 ENCOUNTER — Ambulatory Visit
Admission: RE | Admit: 2018-01-17 | Discharge: 2018-01-17 | Disposition: A | Discharge status: Home / Self Care | Payer: Medicare HMO | Source: Ambulatory Visit | Attending: Internal Medicine | Admitting: Internal Medicine

## 2018-01-17 ENCOUNTER — Inpatient Hospital Stay: Payer: Medicare HMO

## 2018-01-17 DIAGNOSIS — C787 Secondary malignant neoplasm of liver and intrahepatic bile duct: Secondary | ICD-10-CM | POA: Diagnosis not present

## 2018-01-17 DIAGNOSIS — Z79899 Other long term (current) drug therapy: Secondary | ICD-10-CM | POA: Diagnosis not present

## 2018-01-17 DIAGNOSIS — D1771 Benign lipomatous neoplasm of kidney: Secondary | ICD-10-CM | POA: Diagnosis not present

## 2018-01-17 DIAGNOSIS — C772 Secondary and unspecified malignant neoplasm of intra-abdominal lymph nodes: Secondary | ICD-10-CM | POA: Insufficient documentation

## 2018-01-17 DIAGNOSIS — C182 Malignant neoplasm of ascending colon: Secondary | ICD-10-CM

## 2018-01-17 DIAGNOSIS — C189 Malignant neoplasm of colon, unspecified: Secondary | ICD-10-CM | POA: Diagnosis not present

## 2018-01-17 LAB — GLUCOSE, CAPILLARY: GLUCOSE-CAPILLARY: 83 mg/dL (ref 70–99)

## 2018-01-17 MED ORDER — TBO-FILGRASTIM 480 MCG/0.8ML ~~LOC~~ SOSY
480.0000 ug | PREFILLED_SYRINGE | Freq: Once | SUBCUTANEOUS | Status: AC
  Administered 2018-01-17: 480 ug via SUBCUTANEOUS

## 2018-01-17 MED ORDER — FLUDEOXYGLUCOSE F - 18 (FDG) INJECTION
10.8300 | Freq: Once | INTRAVENOUS | Status: AC
  Administered 2018-01-17: 10.83 via INTRAVENOUS

## 2018-01-18 ENCOUNTER — Encounter: Payer: Self-pay | Admitting: Internal Medicine

## 2018-01-18 ENCOUNTER — Inpatient Hospital Stay: Payer: Medicare HMO

## 2018-01-18 ENCOUNTER — Inpatient Hospital Stay (HOSPITAL_BASED_OUTPATIENT_CLINIC_OR_DEPARTMENT_OTHER): Payer: Medicare HMO | Admitting: Internal Medicine

## 2018-01-18 ENCOUNTER — Other Ambulatory Visit: Payer: Self-pay

## 2018-01-18 VITALS — BP 122/76 | HR 71 | Temp 97.6°F | Resp 20 | Ht 64.0 in | Wt 200.0 lb

## 2018-01-18 DIAGNOSIS — R634 Abnormal weight loss: Secondary | ICD-10-CM

## 2018-01-18 DIAGNOSIS — R5383 Other fatigue: Secondary | ICD-10-CM | POA: Diagnosis not present

## 2018-01-18 DIAGNOSIS — R5381 Other malaise: Secondary | ICD-10-CM

## 2018-01-18 DIAGNOSIS — C787 Secondary malignant neoplasm of liver and intrahepatic bile duct: Secondary | ICD-10-CM

## 2018-01-18 DIAGNOSIS — D702 Other drug-induced agranulocytosis: Secondary | ICD-10-CM

## 2018-01-18 DIAGNOSIS — E669 Obesity, unspecified: Secondary | ICD-10-CM

## 2018-01-18 DIAGNOSIS — C182 Malignant neoplasm of ascending colon: Secondary | ICD-10-CM

## 2018-01-18 DIAGNOSIS — T451X5S Adverse effect of antineoplastic and immunosuppressive drugs, sequela: Secondary | ICD-10-CM

## 2018-01-18 DIAGNOSIS — D500 Iron deficiency anemia secondary to blood loss (chronic): Secondary | ICD-10-CM

## 2018-01-18 DIAGNOSIS — Z8719 Personal history of other diseases of the digestive system: Secondary | ICD-10-CM

## 2018-01-18 DIAGNOSIS — Z9221 Personal history of antineoplastic chemotherapy: Secondary | ICD-10-CM | POA: Diagnosis not present

## 2018-01-18 DIAGNOSIS — R110 Nausea: Secondary | ICD-10-CM

## 2018-01-18 DIAGNOSIS — R197 Diarrhea, unspecified: Secondary | ICD-10-CM

## 2018-01-18 DIAGNOSIS — D631 Anemia in chronic kidney disease: Secondary | ICD-10-CM

## 2018-01-18 DIAGNOSIS — N183 Chronic kidney disease, stage 3 (moderate): Secondary | ICD-10-CM

## 2018-01-18 DIAGNOSIS — M1990 Unspecified osteoarthritis, unspecified site: Secondary | ICD-10-CM

## 2018-01-18 DIAGNOSIS — I129 Hypertensive chronic kidney disease with stage 1 through stage 4 chronic kidney disease, or unspecified chronic kidney disease: Secondary | ICD-10-CM

## 2018-01-18 DIAGNOSIS — R630 Anorexia: Secondary | ICD-10-CM | POA: Diagnosis not present

## 2018-01-18 DIAGNOSIS — Z79899 Other long term (current) drug therapy: Secondary | ICD-10-CM

## 2018-01-18 LAB — CBC WITH DIFFERENTIAL/PLATELET
BASOS ABS: 0 10*3/uL (ref 0–0.1)
BASOS PCT: 0 %
Eosinophils Absolute: 0 10*3/uL (ref 0–0.7)
Eosinophils Relative: 1 %
HEMATOCRIT: 23.3 % — AB (ref 35.0–47.0)
HEMOGLOBIN: 7.9 g/dL — AB (ref 12.0–16.0)
Lymphocytes Relative: 21 %
Lymphs Abs: 0.6 10*3/uL — ABNORMAL LOW (ref 1.0–3.6)
MCH: 34.1 pg — ABNORMAL HIGH (ref 26.0–34.0)
MCHC: 33.9 g/dL (ref 32.0–36.0)
MCV: 100.5 fL — ABNORMAL HIGH (ref 80.0–100.0)
MONOS PCT: 14 %
Monocytes Absolute: 0.4 10*3/uL (ref 0.2–0.9)
NEUTROS ABS: 1.8 10*3/uL (ref 1.4–6.5)
Neutrophils Relative %: 64 %
Platelets: 134 10*3/uL — ABNORMAL LOW (ref 150–440)
RBC: 2.31 MIL/uL — AB (ref 3.80–5.20)
RDW: 23.2 % — ABNORMAL HIGH (ref 11.5–14.5)
WBC: 2.8 10*3/uL — AB (ref 3.6–11.0)

## 2018-01-18 NOTE — Assessment & Plan Note (Addendum)
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  #I would recommend discontinuation of Lonsurf at this time; patient unfortunately has gone through multiple lines of standard therapies; also including BRAF + MEK inhibitor combination.  Recommend patient reach out to MD Ouida Sills for any experimental/now treatment options.   #Drug-induced neutropenia/on Granix.  #Diarrhea from [place] improved.  Continue Lomotil as needed.  #Anemia multifactorial history of GI bleed/CKD-Lonsurf.  Hemoglobin 9-10 stable.  # CKD- III/ 1.2/ stable.   #Lab hold tube possible blood transfusion in 2 weeks follow-up with me in 4 weeks labs CEA.  # I reviewed the blood work- with the patient in detail; also reviewed the imaging independently [as summarized above]; and with the patient in detail.

## 2018-01-18 NOTE — Progress Notes (Signed)
[place] OFFICE PROGRESS NOTE  [name], [name], MD (Internal Medicine) [name], [name], RN as Registered Nurse [name], MD as Consulting Physician (Internal Medicine)  Cancer Staging No matching staging information was found for the patient.   Oncology History   # MARCH/APRIL 2017-NEUROENDOCRINE CA STAGE IV; [name]; [name]; [name]; [name]; [name]; [name]; [name]; July 14th  2017 CT- PR  # AUG 28th- FOLFIRI +Avastin; OCT 5th CT scan- PR.   # MARCH 20th- CT STABLE liver lesions; ? Enlarging cecal lesion [CEA rising]; March 26th- FOLFOX [last avastin march 12th 2018];   # resection of primary tumor [Dr.Byrnett]; y90- June 19th 2018.  # July 11th-FOLFOX + Avastin; SEP 2018 [CT- stable liver lesions; increasing ~19m RP LN]; RISING CEA [stopped end of Sep 2018 ]  # OCT 15th 2018- Vemu+ Iri+ Pan [mid nov stopped iri- neutropenia/dia+vemu- arthralgias]; 05/11/2017-Started enco+Bini+vectibex; April 28th PET- PROGRESSION  #    # acute lower GIB-EGD/ colonoscopy [Dr.Anna]- - ulcerated Lesion along the anastomotic line;  biopsy negative for recurrence.    # [facility]- B-RAF V600E- MUTATED [May 21572] # right kidney lesion 2.6 x 1.6 cm ? 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  Patient admits to mild fatigue.  Also admits to mild weight loss.  Poor appetite.  Otherwise denies any pain nausea vomiting or abdominal discomfort.  Review of Systems  Constitutional: Positive for malaise/fatigue and weight loss. Negative for chills, diaphoresis and fever.  HENT: Negative for nosebleeds and sore throat.   Eyes: Negative for double vision.  Respiratory: Negative for cough, hemoptysis, sputum production, shortness of breath and wheezing.   Cardiovascular: Negative for chest pain, palpitations, orthopnea and leg swelling.  Gastrointestinal: Negative for abdominal pain, blood in stool, constipation, diarrhea, heartburn, melena, nausea and vomiting.  Genitourinary: Negative for dysuria, frequency and urgency.  Musculoskeletal: Negative for back pain and joint pain.  Skin: Negative.  Negative for itching and rash.  Neurological: Negative for dizziness, tingling, focal weakness, weakness and headaches.  Endo/Heme/Allergies: Does not bruise/bleed easily.  Psychiatric/Behavioral: Negative for depression. The patient is nervous/anxious. The patient does not have insomnia.       PAST MEDICAL HISTORY :  Past Medical History:  Diagnosis Date  . Angiolipoma of kidney    followed with serial CTs ,,  Dr. CJacqlyn Larsen . Anxiety   . Arthritis   . 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 Chemotherapy induced diarrhea   . Chemotherapy induced nausea and vomiting   . Hammer toe   . Hypertension   . Liver cancer ([place]   . Neuro-endocrine carcinoma ([facility]   . Obesity (BMI 30-39.9)  PAST SURGICAL HISTORY :   Past Surgical History:  Procedure Laterality Date  . ABDOMINAL HYSTERECTOMY  1995   endometriosis, heavy bleeding  . BUNIONECTOMY  Bilateral   . CESAREAN SECTION  1985  . COLONOSCOPY WITH PROPOFOL N/A 10/09/2015   Procedure: COLONOSCOPY WITH PROPOFOL;  Surgeon: Robert Bellow, MD;  Location: [facility] ENDOSCOPY;  Service: Endoscopy;  Laterality: N/A;  . COLONOSCOPY WITH PROPOFOL N/A 09/07/2017   Procedure: COLONOSCOPY WITH PROPOFOL;  Surgeon: Jonathon Bellows, MD;  Location: [facility] ENDOSCOPY;  Service: Gastroenterology;  Laterality: N/A;  . ESOPHAGOGASTRODUODENOSCOPY (EGD) WITH PROPOFOL N/A 09/07/2017   Procedure: ESOPHAGOGASTRODUODENOSCOPY (EGD) WITH PROPOFOL;  Surgeon: Jonathon Bellows, MD;  Location: [facility] ENDOSCOPY;  Service: Gastroenterology;  Laterality: N/A;  . IR ANGIOGRAM SELECTIVE EACH ADDITIONAL VESSEL  11/22/2016  . IR ANGIOGRAM SELECTIVE EACH ADDITIONAL VESSEL  11/22/2016  . IR ANGIOGRAM SELECTIVE EACH ADDITIONAL VESSEL  11/22/2016  . IR ANGIOGRAM SELECTIVE EACH ADDITIONAL VESSEL  12/07/2016  . IR ANGIOGRAM SELECTIVE EACH ADDITIONAL VESSEL  12/07/2016  . IR ANGIOGRAM VISCERAL SELECTIVE  11/22/2016  . IR ANGIOGRAM VISCERAL SELECTIVE  12/07/2016  . IR EMBO ARTERIAL NOT HEMORR HEMANG INC GUIDE ROADMAPPING  11/22/2016  . IR EMBO TUMOR ORGAN ISCHEMIA INFARCT INC GUIDE ROADMAPPING  12/07/2016  . IR GENERIC HISTORICAL  04/07/2016   IR RADIOLOGIST EVAL & MGMT 04/07/2016 Arne Cleveland, MD [facility] INTERV RAD  . IR RADIOLOGIST EVAL & MGMT  04/14/2017  . IR US GUIDE VASC ACCESS RIGHT  11/22/2016  . IR US GUIDE VASC ACCESS RIGHT  12/07/2016  . LAPAROSCOPIC RIGHT COLECTOMY Right 10/26/2016   Procedure: LAPAROSCOPIC RIGHT COLECTOMY;  Surgeon: Robert Bellow, MD;  Location: [facility] ORS;  Service: General;  Laterality: Right;  . OOPHORECTOMY    . PORTACATH PLACEMENT Left 10/01/2015   Procedure: INSERTION PORT-A-CATH;  Surgeon: Robert Bellow, MD;  Location: [facility] ORS;  Service: General;  Laterality: Left;  . ROTATOR CUFF REPAIR Right 2008   right shoulder.  [place]     FAMILY HISTORY :   Family History  Problem Relation Age of Onset  . Mental illness  Mother 26       bipolar, dementia  . Cancer Maternal Aunt 70       breast ca  . Hypertension Father   . Stroke Father 18       deceased  . COPD Father   . Heart disease Sister        deceased    SOCIAL HISTORY:   Social History   Tobacco Use  . Smoking status: Never Smoker  . Smokeless tobacco: Never Used  Substance Use Topics  . Alcohol use: Yes    Alcohol/week: 0.0 standard drinks    Comment: occasionally  . Drug use: No    ALLERGIES:  is allergic to bee venom; dilaudid [hydromorphone hcl]; and morphine and related.  MEDICATIONS:  Current Outpatient Medications  Medication Sig Dispense Refill  . ALPRAZolam (XANAX) 0.25 MG tablet Take 1 tablet (0.25 mg total) by mouth 2 (two) times daily as needed for anxiety. 60 tablet 3  . Biotin w/ Vitamins C & E (HAIR/SKIN/NAILS PO) Take 1 tablet by mouth daily.    . Chlorpheniramine Maleate (CHLOR-TABLETS PO) Take 1 tablet by mouth every evening.    . diphenoxylate-atropine (LOMOTIL) 2.5-0.025 MG tablet Take 1 tablet by mouth 4 (four) times daily as needed for diarrhea or loose stools. 60 tablet 0  . lidocaine-prilocaine (EMLA) cream Apply 1 application topically as needed. Apply to port a cath  site 1 hour prior to chemotherapy treatments 30 g 6  . loperamide (IMODIUM A-D) 2 MG tablet Take 2 mg by mouth 4 (four) times daily as needed for diarrhea or loose stools.    . metoprolol tartrate (LOPRESSOR) 25 MG tablet Take 1 tablet (25 mg total) by mouth 2 (two) times daily. 60 tablet 6  . ondansetron (ZOFRAN) 8 MG tablet Take 1 tablet (8 mg total) by mouth every 8 (eight) hours as needed for nausea or vomiting. 40 tablet 3  . pantoprazole (PROTONIX) 40 MG tablet Take 1 tablet (40 mg total) by mouth daily. 30 tablet 1  . potassium chloride (K-DUR) 10 MEQ tablet TAKE ONE TABLET BY MOUTH TWICE A DAY. 60 tablet 3  . prochlorperazine (COMPAZINE) 10 MG tablet TAKE ONE TABLET BY MOUTH EVERY 6 HOURS AS NEEDED FOR NAUSEA OR VOMITING 30 tablet 2  .  triamcinolone ointment (KENALOG) 0.5 % Apply 1 application topically 2 (two) times daily. To rash (Patient not taking: Reported on 01/18/2018) 30 g 0  . trifluridine-tipiracil (LONSURF) 15-6.14 MG tablet Take 2 tablets by mouth twice daily, 1 hr after AM & PM meals on days 1-5, 8-12. Take with two Lonsurf 20-8.19 mg tablets. Repeat every 28d (Patient not taking: Reported on 01/18/2018) 40 tablet 4  . trifluridine-tipiracil (LONSURF) 20-8.19 MG tablet Take 2 tablets by mouth twice daily, 1 hr after AM & PM meals on days 1-5, 8-12. Take with two Lonsurf 15-6.14 mg tablets. Repeat every 28d (Patient not taking: Reported on 01/18/2018) 40 tablet 4   No current facility-administered medications for this visit.    Facility-Administered Medications Ordered in Other Visits  Medication Dose Route Frequency Provider Last Rate Last Dose  . heparin lock flush 100 unit/mL  500 Units Intravenous Once Charlaine Dalton R, MD      . sodium chloride flush (NS) 0.9 % injection 10 mL  10 mL Intravenous PRN [name], MD   10 mL at 11/24/15 0836    PHYSICAL EXAMINATION: ECOG PERFORMANCE STATUS: 1 - Symptomatic but completely ambulatory  BP 122/76   Pulse 71   Temp 97.6 F (36.4 C) (Tympanic)   Resp 20   Ht '5\' 4"'$  (1.626 m)   Wt 200 lb (90.7 kg)   BMI 34.33 kg/m   Filed Weights   01/18/18 1018  Weight: 200 lb (90.7 kg)    GENERAL: Well-nourished well-developed; Alert, no distress and comfortable.  Accompanied by daughter. EYES: no pallor or icterus OROPHARYNX: no thrush or ulceration; NECK: supple; no lymph nodes felt. LYMPH:  no palpable lymphadenopathy in the axillary or inguinal regions LUNGS: Decreased breath sounds auscultation bilaterally. No wheeze or crackles HEART/CVS: regular rate & rhythm and no murmurs; No lower extremity edema ABDOMEN:abdomen soft, non-tender and normal bowel sounds. No hepatomegaly or splenomegaly.  Musculoskeletal:no cyanosis of digits and no clubbing   PSYCH: alert & oriented x 3 with fluent speech NEURO: no focal motor/sensory deficits SKIN:  no rashes or significant lesions    LABORATORY DATA:  I have reviewed the data as listed    Component Value Date/Time   NA 136 01/13/2018 1037   NA 138 11/04/2016 1009   K 4.5 01/13/2018 1037   CL 108 01/13/2018 1037   CO2 22 01/13/2018 1037   GLUCOSE 104 (H) 01/13/2018 1037   BUN 16 01/13/2018 1037   BUN 14 11/04/2016 1009   CREATININE 1.16 (H) 01/13/2018 1037   CALCIUM 8.9 01/13/2018 1037   PROT 7.1 01/13/2018 1037  PROT 7.9 11/04/2016 1009   ALBUMIN 3.7 01/13/2018 1037   ALBUMIN 4.2 11/04/2016 1009   AST 24 01/13/2018 1037   ALT 17 01/13/2018 1037   ALKPHOS 161 (H) 01/13/2018 1037   BILITOT 0.6 01/13/2018 1037   BILITOT 0.3 11/04/2016 1009   GFRNONAA 48 (L) 01/13/2018 1037   GFRAA 55 (L) 01/13/2018 1037    No results found for: SPEP, UPEP  Lab Results  Component Value Date   WBC 2.8 (L) 01/18/2018   NEUTROABS 1.8 01/18/2018   HGB 7.9 (L) 01/18/2018   HCT 23.3 (L) 01/18/2018   MCV 100.5 (H) 01/18/2018   PLT 134 (L) 01/18/2018      Chemistry      Component Value Date/Time   NA 136 01/13/2018 1037   NA 138 11/04/2016 1009   K 4.5 01/13/2018 1037   CL 108 01/13/2018 1037   CO2 22 01/13/2018 1037   BUN 16 01/13/2018 1037   BUN 14 11/04/2016 1009   CREATININE 1.16 (H) 01/13/2018 1037      Component Value Date/Time   CALCIUM 8.9 01/13/2018 1037   ALKPHOS 161 (H) 01/13/2018 1037   AST 24 01/13/2018 1037   ALT 17 01/13/2018 1037   BILITOT 0.6 01/13/2018 1037   BILITOT 0.3 11/04/2016 1009       RADIOGRAPHIC STUDIES: I have personally reviewed the radiological images as listed and agreed with the findings in the report. No results found.   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  #I would recommend discontinuation of Lonsurf at this time; patient unfortunately has gone through multiple lines of standard therapies; also including BRAF + MEK inhibitor combination.  Recommend patient reach out to MD Ouida Sills for any experimental/now treatment options.   #Drug-induced neutropenia/on Granix.  #Diarrhea from [place] improved.  Continue Lomotil as needed.  #Anemia multifactorial history of GI bleed/CKD-Lonsurf.  Hemoglobin 9-10 stable.  # CKD- III/ 1.2/ stable.   #Lab hold tube possible blood transfusion in 2 weeks follow-up with me in 4 weeks labs CEA.  # I reviewed the blood work- with the patient in detail; also reviewed the imaging independently [as summarized above]; and with the patient in detail.     Orders Placed This Encounter  Procedures  . CBC with Differential    Standing Status:   Future    Number of Occurrences:   1    Standing Expiration Date:   01/18/2019  . CEA    Standing Status:   Standing    Number of Occurrences:   10    Standing Expiration Date:   01/19/2019  . CBC with Differential/Platelet    Standing Status:   Standing    Number of Occurrences:   10    Standing Expiration Date:   01/19/2019  . Comprehensive metabolic panel    Standing Status:   Standing    Number of Occurrences:   10    Standing Expiration Date:   01/19/2019  . Sample to Blood Bank    Standing Status:   Standing    Number of Occurrences:   10    Standing Expiration Date:   01/19/2019   All questions were answered. The patient knows to call the clinic with any problems, questions or concerns.  [name], MD 01/29/2018 7:44 PM

## 2018-01-19 ENCOUNTER — Inpatient Hospital Stay: Payer: Medicare HMO | Attending: Internal Medicine

## 2018-01-22 ENCOUNTER — Encounter: Payer: Self-pay | Admitting: Internal Medicine

## 2018-01-25 ENCOUNTER — Other Ambulatory Visit: Payer: Self-pay

## 2018-01-27 ENCOUNTER — Other Ambulatory Visit: Payer: Self-pay

## 2018-01-27 ENCOUNTER — Ambulatory Visit: Payer: Self-pay | Admitting: Internal Medicine

## 2018-01-27 DIAGNOSIS — C7A8 Other malignant neuroendocrine tumors: Secondary | ICD-10-CM | POA: Diagnosis not present

## 2018-01-27 DIAGNOSIS — C7B8 Other secondary neuroendocrine tumors: Secondary | ICD-10-CM | POA: Diagnosis not present

## 2018-01-30 ENCOUNTER — Telehealth: Payer: Self-pay | Admitting: Internal Medicine

## 2018-01-30 DIAGNOSIS — C787 Secondary malignant neoplasm of liver and intrahepatic bile duct: Secondary | ICD-10-CM | POA: Diagnosis not present

## 2018-02-01 ENCOUNTER — Ambulatory Visit: Payer: Self-pay

## 2018-02-01 ENCOUNTER — Other Ambulatory Visit: Payer: Self-pay

## 2018-02-01 DIAGNOSIS — Z79899 Other long term (current) drug therapy: Secondary | ICD-10-CM | POA: Diagnosis not present

## 2018-02-01 DIAGNOSIS — C7B8 Other secondary neuroendocrine tumors: Secondary | ICD-10-CM | POA: Diagnosis not present

## 2018-02-01 DIAGNOSIS — C7A8 Other malignant neuroendocrine tumors: Secondary | ICD-10-CM | POA: Diagnosis not present

## 2018-02-03 ENCOUNTER — Encounter: Payer: Self-pay | Admitting: *Deleted

## 2018-02-03 ENCOUNTER — Other Ambulatory Visit: Payer: Self-pay | Admitting: Internal Medicine

## 2018-02-03 DIAGNOSIS — C772 Secondary and unspecified malignant neoplasm of intra-abdominal lymph nodes: Secondary | ICD-10-CM | POA: Diagnosis not present

## 2018-02-03 DIAGNOSIS — C180 Malignant neoplasm of cecum: Secondary | ICD-10-CM | POA: Diagnosis not present

## 2018-02-03 DIAGNOSIS — C7989 Secondary malignant neoplasm of other specified sites: Secondary | ICD-10-CM | POA: Diagnosis not present

## 2018-02-03 DIAGNOSIS — C787 Secondary malignant neoplasm of liver and intrahepatic bile duct: Secondary | ICD-10-CM | POA: Diagnosis not present

## 2018-02-03 DIAGNOSIS — C182 Malignant neoplasm of ascending colon: Secondary | ICD-10-CM | POA: Diagnosis not present

## 2018-02-03 NOTE — Telephone Encounter (Signed)
...     Ref Range & Units 3 wk ago  Potassium 3.5 - 5.1 mmol/L 4.5         

## 2018-02-07 DIAGNOSIS — C787 Secondary malignant neoplasm of liver and intrahepatic bile duct: Secondary | ICD-10-CM | POA: Diagnosis not present

## 2018-02-07 DIAGNOSIS — C189 Malignant neoplasm of colon, unspecified: Secondary | ICD-10-CM | POA: Diagnosis not present

## 2018-02-13 ENCOUNTER — Encounter: Payer: Self-pay | Admitting: Internal Medicine

## 2018-02-15 ENCOUNTER — Encounter: Payer: Self-pay | Admitting: Internal Medicine

## 2018-02-15 ENCOUNTER — Other Ambulatory Visit: Payer: Self-pay

## 2018-02-15 ENCOUNTER — Inpatient Hospital Stay: Payer: Medicare HMO | Attending: Internal Medicine | Admitting: Internal Medicine

## 2018-02-15 VITALS — BP 137/89 | HR 74 | Temp 97.0°F | Resp 16 | Wt 197.7 lb

## 2018-02-15 DIAGNOSIS — M1990 Unspecified osteoarthritis, unspecified site: Secondary | ICD-10-CM | POA: Diagnosis not present

## 2018-02-15 DIAGNOSIS — F418 Other specified anxiety disorders: Secondary | ICD-10-CM | POA: Insufficient documentation

## 2018-02-15 DIAGNOSIS — C787 Secondary malignant neoplasm of liver and intrahepatic bile duct: Secondary | ICD-10-CM | POA: Diagnosis not present

## 2018-02-15 DIAGNOSIS — N183 Chronic kidney disease, stage 3 (moderate): Secondary | ICD-10-CM | POA: Insufficient documentation

## 2018-02-15 DIAGNOSIS — I129 Hypertensive chronic kidney disease with stage 1 through stage 4 chronic kidney disease, or unspecified chronic kidney disease: Secondary | ICD-10-CM

## 2018-02-15 DIAGNOSIS — Z79899 Other long term (current) drug therapy: Secondary | ICD-10-CM | POA: Diagnosis not present

## 2018-02-15 DIAGNOSIS — R197 Diarrhea, unspecified: Secondary | ICD-10-CM

## 2018-02-15 DIAGNOSIS — C182 Malignant neoplasm of ascending colon: Secondary | ICD-10-CM | POA: Diagnosis not present

## 2018-02-15 DIAGNOSIS — R5383 Other fatigue: Secondary | ICD-10-CM | POA: Diagnosis not present

## 2018-02-15 DIAGNOSIS — D631 Anemia in chronic kidney disease: Secondary | ICD-10-CM

## 2018-02-15 DIAGNOSIS — Z9221 Personal history of antineoplastic chemotherapy: Secondary | ICD-10-CM | POA: Diagnosis not present

## 2018-02-15 DIAGNOSIS — R5381 Other malaise: Secondary | ICD-10-CM

## 2018-02-15 NOTE — Progress Notes (Signed)
[place] OFFICE PROGRESS NOTE  [name], [name], MD (Internal Medicine) [name], [name], RN as Registered Nurse [name], MD as Consulting Physician (Internal Medicine)  Cancer Staging No matching staging information was found for the patient.   Oncology History   # MARCH/APRIL 2017-NEUROENDOCRINE CA STAGE IV; [name]; [name]; [name]; [name]; [name]; [name]; [name]; July 14th  2017 CT- PR  # AUG 28th- FOLFIRI +Avastin; OCT 5th CT scan- PR.   # MARCH 20th- CT STABLE liver lesions; ? Enlarging cecal lesion [CEA rising]; March 26th- FOLFOX [last avastin march 12th 2018];   # resection of primary tumor [Dr.Byrnett]; Y 89- June 19th 2018.  # July 11th-FOLFOX + Avastin; SEP 2018 [CT- stable liver lesions; increasing ~37mm RP LN]; RISING CEA [stopped end of Sep 2018 ]  # OCT 15th 2018- Vemu+ Iri+ Pan [mid nov stopped iri- neutropenia/dia+vemu- arthralgias]; 05/11/2017-Started enco+Bini+vectibex; April 28th PET- PROGRESSION  # May 2019- Lonsurf; End of July 2019- PET Progression; Stopped Lonserf  # acute lower GIB-EGD/ colonoscopy [Dr.Anna]- - ulcerated Lesion along the anastomotic line;  biopsy negative for recurrence.    # [employer]- B-RAF V600E- MUTATED [May 1275]  # right kidney lesion 2.6 x 1.6 cm ? Angiolipoma [followed by Urology in past]  ------------------------------------------------------------    DIAGNOSIS: [Jan 2017 ]-right-sided colon cancer [neuroendocrine/adeno CA; B-RAF mutated]  STAGE:  IV  ;GOALS: PALLIATIVE  CURRENT/MOST RECENT THERAPY [may 15th 2019 ]- lonsurf       Malignant neoplasm of ascending colon ([place])      INTERVAL HISTORY:  Kathleen James 67 y.o.  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  In the interim patient was evaluated at [facility] for a phase 1 clinical trial.  I have reviewed the records in detail.  However patient is awaiting to see if she could get into the clinical trial locally at [place]/close to home.  Otherwise denies any blood in stools black or stools.  Denies any pain.  No nausea no vomiting.  Appetite is fair.  Mild fatigue.  Review of Systems  Constitutional: Positive for malaise/fatigue. Negative for chills, diaphoresis, fever and weight loss.  HENT: Negative for nosebleeds and sore throat.   Eyes: Negative for double vision.  Respiratory: Negative for cough, hemoptysis, sputum production, shortness of breath and wheezing.   Cardiovascular: Negative for chest pain, palpitations, orthopnea and leg swelling.  Gastrointestinal: Negative for abdominal pain, blood in stool, constipation, diarrhea, heartburn, melena, nausea and vomiting.  Genitourinary: Negative for dysuria, frequency and urgency.  Musculoskeletal: Negative for back pain and joint pain.  Skin: Negative.  Negative for itching and rash.  Neurological: Negative for dizziness, tingling, focal weakness, weakness and headaches.  Endo/Heme/Allergies: Does not bruise/bleed easily.  Psychiatric/Behavioral: Negative for depression. The patient is not nervous/anxious and does not have insomnia.       PAST MEDICAL HISTORY :  Past Medical History:  Diagnosis Date  . Angiolipoma of kidney    followed with serial CTs ,,  Dr. Jacqlyn Larsen  . Anxiety   . Arthritis   . 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 Chemotherapy induced diarrhea   . Chemotherapy induced nausea and vomiting    . Hammer toe   . Hypertension   . Liver cancer ([place])   . Neuro-endocrine carcinoma ([place])   . Obesity (BMI 30-39.9)     PAST SURGICAL HISTORY :   Past Surgical History:  Procedure Laterality Date  . ABDOMINAL HYSTERECTOMY  1995   endometriosis, heavy bleeding  . BUNIONECTOMY Bilateral   . CESAREAN SECTION  1985  . COLONOSCOPY WITH PROPOFOL N/A 10/09/2015   Procedure: COLONOSCOPY WITH PROPOFOL;  Surgeon: Robert Bellow, MD;  Location: [facility] ENDOSCOPY;  Service: Endoscopy;  Laterality: N/A;  . COLONOSCOPY WITH PROPOFOL N/A 09/07/2017   Procedure: COLONOSCOPY WITH PROPOFOL;  Surgeon: Jonathon Bellows, MD;  Location: [facility] ENDOSCOPY;  Service: Gastroenterology;  Laterality: N/A;  . ESOPHAGOGASTRODUODENOSCOPY (EGD) WITH PROPOFOL N/A 09/07/2017   Procedure: ESOPHAGOGASTRODUODENOSCOPY (EGD) WITH PROPOFOL;  Surgeon: Jonathon Bellows, MD;  Location: [facility] ENDOSCOPY;  Service: Gastroenterology;  Laterality: N/A;  . IR ANGIOGRAM SELECTIVE EACH ADDITIONAL VESSEL  11/22/2016  . IR ANGIOGRAM SELECTIVE EACH ADDITIONAL VESSEL  11/22/2016  . IR ANGIOGRAM SELECTIVE EACH ADDITIONAL VESSEL  11/22/2016  . IR ANGIOGRAM SELECTIVE EACH ADDITIONAL VESSEL  12/07/2016  . IR ANGIOGRAM SELECTIVE EACH ADDITIONAL VESSEL  12/07/2016  . IR ANGIOGRAM VISCERAL SELECTIVE  11/22/2016  . IR ANGIOGRAM VISCERAL SELECTIVE  12/07/2016  . IR EMBO ARTERIAL NOT HEMORR HEMANG INC GUIDE ROADMAPPING  11/22/2016  . IR EMBO TUMOR ORGAN ISCHEMIA INFARCT INC GUIDE ROADMAPPING  12/07/2016  . IR GENERIC HISTORICAL  04/07/2016   IR RADIOLOGIST EVAL & MGMT 04/07/2016 Arne Cleveland, MD [facility] INTERV RAD  . IR RADIOLOGIST EVAL & MGMT  04/14/2017  . IR US GUIDE VASC ACCESS RIGHT  11/22/2016  . IR US GUIDE VASC ACCESS RIGHT  12/07/2016  . LAPAROSCOPIC RIGHT COLECTOMY Right 10/26/2016   Procedure: LAPAROSCOPIC RIGHT COLECTOMY;  Surgeon: Robert Bellow, MD;  Location: [facility] ORS;  Service: General;  Laterality: Right;  . OOPHORECTOMY    . PORTACATH PLACEMENT Left  10/01/2015   Procedure: INSERTION PORT-A-CATH;  Surgeon: Robert Bellow, MD;  Location: [facility] ORS;  Service: General;  Laterality: Left;  . ROTATOR CUFF REPAIR Right 2008   right shoulder.  [place]     FAMILY HISTORY :   Family History  Problem Relation Age of Onset  . Mental illness Mother 29       bipolar, dementia  . Cancer Maternal Aunt 70       breast ca  . Hypertension Father   . Stroke Father 37       deceased  . COPD Father   . Heart disease Sister        deceased    SOCIAL HISTORY:   Social History   Tobacco Use  . Smoking status: Never Smoker  . Smokeless tobacco: Never Used  Substance Use Topics  . Alcohol use: Yes    Alcohol/week: 0.0 standard drinks    Comment: occasionally  . Drug use: No    ALLERGIES:  is allergic to bee venom; dilaudid [hydromorphone hcl]; and morphine and related.  MEDICATIONS:  Current Outpatient Medications  Medication Sig Dispense Refill  . ALPRAZolam (XANAX) 0.25 MG tablet Take 1 tablet (0.25 mg total) by mouth 2 (two) times daily as needed for anxiety. 60 tablet 3  . Biotin w/ Vitamins C & E (HAIR/SKIN/NAILS PO) Take 1 tablet by mouth daily.    Marland Kitchen  Chlorpheniramine Maleate (CHLOR-TABLETS PO) Take 1 tablet by mouth every evening.    . diphenoxylate-atropine (LOMOTIL) 2.5-0.025 MG tablet Take 1 tablet by mouth 4 (four) times daily as needed for diarrhea or loose stools. 60 tablet 0  . lidocaine-prilocaine (EMLA) cream Apply 1 application topically as needed. Apply to port a cath site 1 hour prior to chemotherapy treatments 30 g 6  . loperamide (IMODIUM A-D) 2 MG tablet Take 2 mg by mouth 4 (four) times daily as needed for diarrhea or loose stools.    . metoprolol tartrate (LOPRESSOR) 25 MG tablet Take 1 tablet (25 mg total) by mouth 2 (two) times daily. 60 tablet 6  . ondansetron (ZOFRAN) 8 MG tablet Take 1 tablet (8 mg total) by mouth every 8 (eight) hours as needed for nausea or vomiting. 40 tablet 3  . pantoprazole (PROTONIX) 40 MG  tablet Take 1 tablet (40 mg total) by mouth daily. 30 tablet 1  . potassium chloride (K-DUR) 10 MEQ tablet TAKE ONE TABLET BY MOUTH TWICE DAILY 60 tablet 3  . prochlorperazine (COMPAZINE) 10 MG tablet TAKE ONE TABLET BY MOUTH EVERY 6 HOURS AS NEEDED FOR NAUSEA OR VOMITING 30 tablet 2  . triamcinolone ointment (KENALOG) 0.5 % Apply 1 application topically 2 (two) times daily. To rash 30 g 0  . trifluridine-tipiracil (LONSURF) 15-6.14 MG tablet Take 2 tablets by mouth twice daily, 1 hr after AM & PM meals on days 1-5, 8-12. Take with two Lonsurf 20-8.19 mg tablets. Repeat every 28d 40 tablet 4  . trifluridine-tipiracil (LONSURF) 20-8.19 MG tablet Take 2 tablets by mouth twice daily, 1 hr after AM & PM meals on days 1-5, 8-12. Take with two Lonsurf 15-6.14 mg tablets. Repeat every 28d 40 tablet 4   No current facility-administered medications for this visit.    Facility-Administered Medications Ordered in Other Visits  Medication Dose Route Frequency Provider Last Rate Last Dose  . heparin lock flush 100 unit/mL  500 Units Intravenous Once Charlaine Dalton R, MD      . sodium chloride flush (NS) 0.9 % injection 10 mL  10 mL Intravenous PRN [name], MD   10 mL at 11/24/15 0836    PHYSICAL EXAMINATION: ECOG PERFORMANCE STATUS: 0 - Asymptomatic  BP 137/89 (BP Location: Left Arm, Patient Position: Sitting)   Pulse 74   Temp (!) 97 F (36.1 C)   Resp 16   Wt 197 lb 11.2 oz (89.7 kg)   BMI 33.94 kg/m   Filed Weights   02/15/18 1009  Weight: 197 lb 11.2 oz (89.7 kg)    Physical Exam  Constitutional: She is oriented to person, place, and time and well-developed, well-nourished, and in no distress.  Patient is alone.  Completely ambulatory.  HENT:  Head: Normocephalic and atraumatic.  Mouth/Throat: Oropharynx is clear and moist. No oropharyngeal exudate.  Eyes: Pupils are equal, round, and reactive to light.  Neck: Normal range of motion. Neck supple.  Cardiovascular:  Normal rate and regular rhythm.  Pulmonary/Chest: No respiratory distress. She has no wheezes.  Abdominal: Soft. Bowel sounds are normal. She exhibits no distension and no mass. There is no tenderness. There is no rebound and no guarding.  Musculoskeletal: Normal range of motion. She exhibits no edema or tenderness.  Neurological: She is alert and oriented to person, place, and time.  Skin: Skin is warm.  Psychiatric: Affect normal.       LABORATORY DATA:  I have reviewed the data as listed  Component Value Date/Time   NA 136 01/13/2018 1037   NA 138 11/04/2016 1009   K 4.5 01/13/2018 1037   CL 108 01/13/2018 1037   CO2 22 01/13/2018 1037   GLUCOSE 104 (H) 01/13/2018 1037   BUN 16 01/13/2018 1037   BUN 14 11/04/2016 1009   CREATININE 1.16 (H) 01/13/2018 1037   CALCIUM 8.9 01/13/2018 1037   PROT 7.1 01/13/2018 1037   PROT 7.9 11/04/2016 1009   ALBUMIN 3.7 01/13/2018 1037   ALBUMIN 4.2 11/04/2016 1009   AST 24 01/13/2018 1037   ALT 17 01/13/2018 1037   ALKPHOS 161 (H) 01/13/2018 1037   BILITOT 0.6 01/13/2018 1037   BILITOT 0.3 11/04/2016 1009   GFRNONAA 48 (L) 01/13/2018 1037   GFRAA 55 (L) 01/13/2018 1037    No results found for: SPEP, UPEP  Lab Results  Component Value Date   WBC 2.8 (L) 01/18/2018   NEUTROABS 1.8 01/18/2018   HGB 7.9 (L) 01/18/2018   HCT 23.3 (L) 01/18/2018   MCV 100.5 (H) 01/18/2018   PLT 134 (L) 01/18/2018      Chemistry      Component Value Date/Time   NA 136 01/13/2018 1037   NA 138 11/04/2016 1009   K 4.5 01/13/2018 1037   CL 108 01/13/2018 1037   CO2 22 01/13/2018 1037   BUN 16 01/13/2018 1037   BUN 14 11/04/2016 1009   CREATININE 1.16 (H) 01/13/2018 1037      Component Value Date/Time   CALCIUM 8.9 01/13/2018 1037   ALKPHOS 161 (H) 01/13/2018 1037   AST 24 01/13/2018 1037   ALT 17 01/13/2018 1037   BILITOT 0.6 01/13/2018 1037   BILITOT 0.3 11/04/2016 1009       RADIOGRAPHIC STUDIES: I have personally reviewed the  radiological images as listed and agreed with the findings in the report. No results found.   ASSESSMENT & PLAN:  Malignant neoplasm of ascending colon ([place])  #Metastatic B-RAF mutated neuroendocrine/ adeno ca of the colon.  Unfortunately 3 cycles of Lonsurf; End of July 2019 PET scan shows progressive disease/CEA elevated at 1200.   #Given the progression of disease on Lonsurf; Lonsurf has been discontinued.  Patient eligible for phase 1 clinical trial at [facility] trial.  However, patient family interested in getting the clinical trial closer/[facility].  I think that is very reasonable.  # Diarrhea from Lonsurf improved.  # Anemia multifactorial history of GI bleed/CKD-Lonsurf.  Hemoglobin 9-10. STABLE.   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    # follow up TBD.  Will hold off labs-patient's preference.  Patient will update [place] regarding the status of her enrollment-and she can call us with any questions in the interim.   No orders of the defined types were placed in this encounter.  All questions were answered. The patient knows to call the clinic with any problems, questions or concerns.      [name], MD 02/15/2018 12:52 PM

## 2018-02-15 NOTE — Assessment & Plan Note (Addendum)
#  Metastatic B-RAF mutated neuroendocrine/ adeno ca of the colon.  Unfortunately 3 cycles of Lonsurf; End of July 2019 PET scan shows progressive disease/CEA elevated at 1200.   #Given the progression of disease on Lonsurf; Lonsurf has been discontinued.  Patient eligible for phase 1 clinical trial at [facility] trial.  However, patient family interested in getting the clinical trial closer/[facility].  I think that is very reasonable.  # Diarrhea from Lonsurf improved.  # Anemia multifactorial history of GI bleed/CKD-Lonsurf.  Hemoglobin 9-10. STABLE.   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    # follow up TBD.  Will hold off labs-patient's preference.  Patient will update [place] regarding the status of her enrollment-and she can call us with any questions in the interim.

## 2018-02-17 ENCOUNTER — Encounter: Payer: Self-pay | Admitting: Internal Medicine

## 2018-02-17 ENCOUNTER — Other Ambulatory Visit: Payer: Self-pay | Admitting: *Deleted

## 2018-02-17 DIAGNOSIS — Z599 Problem related to housing and economic circumstances, unspecified: Secondary | ICD-10-CM

## 2018-02-17 DIAGNOSIS — Z598 Other problems related to housing and economic circumstances: Secondary | ICD-10-CM

## 2018-02-22 NOTE — Telephone Encounter (Signed)
x

## 2018-03-01 ENCOUNTER — Encounter: Payer: Self-pay | Admitting: Internal Medicine

## 2018-03-03 DIAGNOSIS — C189 Malignant neoplasm of colon, unspecified: Secondary | ICD-10-CM | POA: Diagnosis not present

## 2018-03-20 ENCOUNTER — Encounter: Payer: Self-pay | Admitting: *Deleted

## 2018-03-28 ENCOUNTER — Other Ambulatory Visit: Payer: Self-pay | Admitting: Internal Medicine

## 2018-04-06 DIAGNOSIS — R69 Illness, unspecified: Secondary | ICD-10-CM | POA: Diagnosis not present

## 2018-04-13 DIAGNOSIS — C189 Malignant neoplasm of colon, unspecified: Secondary | ICD-10-CM | POA: Diagnosis not present

## 2018-04-14 DIAGNOSIS — C787 Secondary malignant neoplasm of liver and intrahepatic bile duct: Secondary | ICD-10-CM | POA: Diagnosis not present

## 2018-04-17 DIAGNOSIS — R82998 Other abnormal findings in urine: Secondary | ICD-10-CM | POA: Diagnosis not present

## 2018-04-17 DIAGNOSIS — C189 Malignant neoplasm of colon, unspecified: Secondary | ICD-10-CM | POA: Diagnosis not present

## 2018-04-20 DIAGNOSIS — Z006 Encounter for examination for normal comparison and control in clinical research program: Secondary | ICD-10-CM | POA: Diagnosis not present

## 2018-04-20 DIAGNOSIS — E860 Dehydration: Secondary | ICD-10-CM | POA: Diagnosis not present

## 2018-04-20 DIAGNOSIS — C189 Malignant neoplasm of colon, unspecified: Secondary | ICD-10-CM | POA: Diagnosis not present

## 2018-04-28 DIAGNOSIS — E860 Dehydration: Secondary | ICD-10-CM | POA: Diagnosis not present

## 2018-04-28 DIAGNOSIS — C189 Malignant neoplasm of colon, unspecified: Secondary | ICD-10-CM | POA: Diagnosis not present

## 2018-04-28 DIAGNOSIS — Z006 Encounter for examination for normal comparison and control in clinical research program: Secondary | ICD-10-CM | POA: Diagnosis not present

## 2018-05-05 ENCOUNTER — Other Ambulatory Visit: Payer: Self-pay | Admitting: *Deleted

## 2018-05-05 ENCOUNTER — Other Ambulatory Visit: Payer: Self-pay | Admitting: Internal Medicine

## 2018-05-05 ENCOUNTER — Encounter: Payer: Self-pay | Admitting: Internal Medicine

## 2018-05-05 DIAGNOSIS — Z006 Encounter for examination for normal comparison and control in clinical research program: Secondary | ICD-10-CM | POA: Diagnosis not present

## 2018-05-05 DIAGNOSIS — C182 Malignant neoplasm of ascending colon: Secondary | ICD-10-CM

## 2018-05-05 DIAGNOSIS — E860 Dehydration: Secondary | ICD-10-CM | POA: Diagnosis not present

## 2018-05-05 NOTE — Telephone Encounter (Signed)
   Ref Range & Units 57mo ago  Potassium 3.5 - 5.1 mmol/L 4.5

## 2018-05-08 ENCOUNTER — Inpatient Hospital Stay: Payer: Medicare HMO

## 2018-05-08 ENCOUNTER — Ambulatory Visit: Payer: Medicare HMO

## 2018-05-08 ENCOUNTER — Inpatient Hospital Stay: Payer: Medicare HMO | Attending: Nurse Practitioner | Admitting: Nurse Practitioner

## 2018-05-08 VITALS — BP 113/80 | HR 78 | Temp 96.9°F | Resp 18 | Wt 193.0 lb

## 2018-05-08 DIAGNOSIS — R5383 Other fatigue: Secondary | ICD-10-CM | POA: Insufficient documentation

## 2018-05-08 DIAGNOSIS — R634 Abnormal weight loss: Secondary | ICD-10-CM | POA: Diagnosis not present

## 2018-05-08 DIAGNOSIS — T451X5S Adverse effect of antineoplastic and immunosuppressive drugs, sequela: Secondary | ICD-10-CM | POA: Diagnosis not present

## 2018-05-08 DIAGNOSIS — E860 Dehydration: Secondary | ICD-10-CM

## 2018-05-08 DIAGNOSIS — M1990 Unspecified osteoarthritis, unspecified site: Secondary | ICD-10-CM | POA: Diagnosis not present

## 2018-05-08 DIAGNOSIS — N183 Chronic kidney disease, stage 3 (moderate): Secondary | ICD-10-CM | POA: Insufficient documentation

## 2018-05-08 DIAGNOSIS — C787 Secondary malignant neoplasm of liver and intrahepatic bile duct: Secondary | ICD-10-CM | POA: Diagnosis not present

## 2018-05-08 DIAGNOSIS — R5381 Other malaise: Secondary | ICD-10-CM | POA: Diagnosis not present

## 2018-05-08 DIAGNOSIS — I129 Hypertensive chronic kidney disease with stage 1 through stage 4 chronic kidney disease, or unspecified chronic kidney disease: Secondary | ICD-10-CM | POA: Insufficient documentation

## 2018-05-08 DIAGNOSIS — F418 Other specified anxiety disorders: Secondary | ICD-10-CM

## 2018-05-08 DIAGNOSIS — N179 Acute kidney failure, unspecified: Secondary | ICD-10-CM | POA: Diagnosis not present

## 2018-05-08 DIAGNOSIS — C182 Malignant neoplasm of ascending colon: Secondary | ICD-10-CM

## 2018-05-08 DIAGNOSIS — K521 Toxic gastroenteritis and colitis: Secondary | ICD-10-CM | POA: Insufficient documentation

## 2018-05-08 DIAGNOSIS — D702 Other drug-induced agranulocytosis: Secondary | ICD-10-CM | POA: Diagnosis not present

## 2018-05-08 DIAGNOSIS — Z79899 Other long term (current) drug therapy: Secondary | ICD-10-CM | POA: Diagnosis not present

## 2018-05-08 DIAGNOSIS — R197 Diarrhea, unspecified: Secondary | ICD-10-CM

## 2018-05-08 DIAGNOSIS — F419 Anxiety disorder, unspecified: Secondary | ICD-10-CM | POA: Diagnosis not present

## 2018-05-08 DIAGNOSIS — Z9221 Personal history of antineoplastic chemotherapy: Secondary | ICD-10-CM | POA: Insufficient documentation

## 2018-05-08 DIAGNOSIS — C801 Malignant (primary) neoplasm, unspecified: Secondary | ICD-10-CM

## 2018-05-08 LAB — CBC WITH DIFFERENTIAL/PLATELET
ABS IMMATURE GRANULOCYTES: 0.01 10*3/uL (ref 0.00–0.07)
BASOS ABS: 0 10*3/uL (ref 0.0–0.1)
BASOS PCT: 1 %
Eosinophils Absolute: 0.1 10*3/uL (ref 0.0–0.5)
Eosinophils Relative: 4 %
HCT: 34.1 % — ABNORMAL LOW (ref 36.0–46.0)
HEMOGLOBIN: 11.3 g/dL — AB (ref 12.0–15.0)
IMMATURE GRANULOCYTES: 0 %
LYMPHS PCT: 21 %
Lymphs Abs: 0.6 10*3/uL — ABNORMAL LOW (ref 0.7–4.0)
MCH: 31.9 pg (ref 26.0–34.0)
MCHC: 33.1 g/dL (ref 30.0–36.0)
MCV: 96.3 fL (ref 80.0–100.0)
Monocytes Absolute: 0.2 10*3/uL (ref 0.1–1.0)
Monocytes Relative: 5 %
NEUTROS ABS: 2.1 10*3/uL (ref 1.7–7.7)
NEUTROS PCT: 69 %
NRBC: 0 % (ref 0.0–0.2)
PLATELETS: 134 10*3/uL — AB (ref 150–400)
RBC: 3.54 MIL/uL — AB (ref 3.87–5.11)
RDW: 13.2 % (ref 11.5–15.5)
WBC: 3 10*3/uL — AB (ref 4.0–10.5)

## 2018-05-08 LAB — COMPREHENSIVE METABOLIC PANEL
ALBUMIN: 3.1 g/dL — AB (ref 3.5–5.0)
ALT: 27 U/L (ref 0–44)
ANION GAP: 8 (ref 5–15)
AST: 44 U/L — ABNORMAL HIGH (ref 15–41)
Alkaline Phosphatase: 129 U/L — ABNORMAL HIGH (ref 38–126)
BUN: 14 mg/dL (ref 8–23)
CHLORIDE: 108 mmol/L (ref 98–111)
CO2: 20 mmol/L — ABNORMAL LOW (ref 22–32)
Calcium: 9 mg/dL (ref 8.9–10.3)
Creatinine, Ser: 1.77 mg/dL — ABNORMAL HIGH (ref 0.44–1.00)
GFR calc Af Amer: 33 mL/min — ABNORMAL LOW (ref >60)
GFR, EST NON AFRICAN AMERICAN: 29 mL/min — AB (ref >60)
Glucose, Bld: 113 mg/dL — ABNORMAL HIGH (ref 70–99)
POTASSIUM: 3.7 mmol/L (ref 3.5–5.1)
Sodium: 136 mmol/L (ref 135–145)
Total Bilirubin: 0.4 mg/dL (ref 0.3–1.2)
Total Protein: 6.6 g/dL (ref 6.5–8.1)

## 2018-05-08 LAB — MAGNESIUM: MAGNESIUM: 1.6 mg/dL — AB (ref 1.7–2.4)

## 2018-05-08 MED ORDER — SODIUM CHLORIDE 0.9 % IV SOLN
Freq: Once | INTRAVENOUS | Status: AC
  Administered 2018-05-08: 11:00:00 via INTRAVENOUS
  Filled 2018-05-08: qty 250

## 2018-05-08 MED ORDER — SODIUM CHLORIDE 0.9% FLUSH
10.0000 mL | Freq: Once | INTRAVENOUS | Status: AC
  Administered 2018-05-08: 10 mL via INTRAVENOUS
  Filled 2018-05-08: qty 10

## 2018-05-08 MED ORDER — HEPARIN SOD (PORK) LOCK FLUSH 100 UNIT/ML IV SOLN
500.0000 [IU] | Freq: Once | INTRAVENOUS | Status: AC
  Administered 2018-05-08: 500 [IU] via INTRAVENOUS

## 2018-05-08 NOTE — Progress Notes (Signed)
Nutrition Assessment   Reason for Assessment:   Verbal referral from [place], NP for diarrhea   ASSESSMENT:  67 year old female with neuroendocrine/adenocarcinoma of ascending colon.  Patient in clinical trial and receiving oral chemotherapy.  Past medical history of HTN  Met with patient in [facility] clinic today.  Patient reports has been having nausea, vomiting and diarrhea per her report likely due to chemotherapy that she is receiving.  Reports that she has been following the BRAT diet and diarrhea has decreased.  Reports yesterday ate bananas, rice and chicken soup, toast.  Reports that she is feeling better but wanting to know what other foods she can try.  Previously intake was fairly good.  Reports that she has always had to watch what she eats due to foods upsetting her stomach.     Nutrition Focused Physical Exam: deferred   Medications: zofran, protonix, Kdur, biotin Vit C and E, lomotil, imodium, compazine   Labs: Mag 1.6, creatinine 1.77   Anthropometrics:   Height: 64 inches Weight: 193 lb UBW: Noted 200 lb in 12/2017 BMI: 33  4% weight loss in the last 4 months   NUTRITION DIAGNOSIS: Inadequate oral intake related to cancer related treatment side effects as evidenced by nausea, vomiting, diarrhea, and 4% weight loss   MALNUTRITION DIAGNOSIS: continue to monitor   INTERVENTION:  Discussed foods appropriate for neuroendocrine tumor and handout from [facility] provided to help with diarrhea.   Discussed oral nutrition supplements and hydration alternatives during times of diarrhea.  Samples provided to patient   MONITORING, EVALUATION, GOAL: weight trends, intake   Next Visit: as needed.  Patient given contact information and encouraged to call if needed  Tersa Fotopoulos B. Zenia Resides, [place], [place] Registered Dietitian [phone] (pager)

## 2018-05-08 NOTE — Progress Notes (Signed)
Symptom Management [place]  Telephone:([phone] Fax:(336) [phone]  [name], [name], MD (Internal Medicine) Bary Castilla, Forest Gleason, [name], RN as Registered Nurse [name], MD as Consulting Physician (Internal Medicine)   Name of the patient: Kathleen James  [id number]  10-01-1950   Date of visit: 05/08/18  Diagnosis-neuroendocrine/adenocarcinoma of ascending colon  Chief complaint/ Reason for visit- Diarrhea  Heme/Onc history:  Oncology History   # MARCH/APRIL 2017-NEUROENDOCRINE CA STAGE IV; [name]; [name]; [name]; [name]; [name]; [name]; [name]; July 14th  2017 CT- PR  # AUG 28th- FOLFIRI +Avastin; OCT 5th CT scan- PR.   # MARCH 20th- CT STABLE liver lesions; ? Enlarging cecal lesion [CEA rising]; March 26th- FOLFOX [last avastin march 12th 2018];   # resection of primary tumor [Dr.Byrnett]; Y 90- June 19th 2018.  # July 11th-FOLFOX + Avastin; SEP 2018 [CT- stable liver lesions; increasing ~11mm RP LN]; RISING CEA [stopped end of Sep 2018 ]  # OCT 15th 2018- Vemu+ Iri+ Pan [mid nov stopped iri- neutropenia/dia+vemu- arthralgias]; 05/11/2017-Started enco+Bini+vectibex; April 28th PET- PROGRESSION  # May 2019- Lonsurf; End of July 2019- PET Progression; Stopped Lonserf  # acute lower GIB-EGD/ colonoscopy [Dr.Anna]- - ulcerated Lesion along the anastomotic line;  biopsy negative for recurrence.    # FOUNDATION ONE- B-RAF V600E- MUTATED [May 6213]  # right kidney lesion 2.6 x 1.6 cm ? Angiolipoma [followed by Urology in  past]  ------------------------------------------------------------    DIAGNOSIS: [Jan 2017 ]-right-sided colon cancer [neuroendocrine/adeno CA; B-RAF mutated]  STAGE:  IV  ;GOALS: PALLIATIVE  CURRENT/MOST RECENT THERAPY [may 15th 2019 ]- lonsurf      Malignant neoplasm of ascending colon ([place])    Interval history-Kathleen James "Kathleen James, 67 year old female with above history of neuroendocrine/adenocarcinoma of the colon who presents to symptom management clinic for chemotherapy-induced diarrhea.  She is currently on clinical trial at [place] in [facility] and was seen end of last week and over the weekend.  Her labs noted to have some evidence of dehydration and she received IV fluid hydration.  She continues to have diarrhea and started Lomotil over the weekend she began "BRAT' diet and reports improvement in her symptoms.  She questions food choices and says she has trouble getting her calories with current foods.  She feels tired.  Appetite is fair.  Denies any blood in stools.  Denies pain.  Anxiety is stable.  No nausea or vomiting.  She continues on phase 1 clinical trial and is scheduled to be seen again in [place] later this week.  ECOG FS:2 - Symptomatic, <50% confined to bed  Review of systems- Review of Systems  Constitutional: Positive for malaise/fatigue. Negative for chills, fever and weight loss.  HENT: Negative for hearing loss, nosebleeds, sore throat and tinnitus.   Eyes: Negative for blurred vision and double vision.  Respiratory: Negative for cough, hemoptysis, shortness of breath and wheezing.   Cardiovascular: Negative for chest pain, palpitations and leg swelling.  Gastrointestinal: Positive for diarrhea. Negative for abdominal pain, blood in stool, constipation, melena, nausea and vomiting.  Genitourinary: Negative for dysuria, flank pain, hematuria and urgency.  Musculoskeletal: Negative for back  pain, falls, joint pain and  myalgias.  Skin: Negative for itching and rash.  Neurological: Negative for dizziness, tingling, sensory change, loss of consciousness, weakness and headaches.  Endo/Heme/Allergies: Negative for environmental allergies. Does not bruise/bleed easily.  Psychiatric/Behavioral: Negative for depression. The patient is nervous/anxious. The patient does not have insomnia.      Allergies  Allergen Reactions  . Bee Venom Hives    Has epi-pen  . Dilaudid [Hydromorphone Hcl] Nausea And Vomiting  . Morphine And Related Nausea Only    Past Medical History:  Diagnosis Date  . Angiolipoma of kidney    followed with serial CTs ,,  Dr. Jacqlyn Larsen  . Anxiety   . Arthritis   . 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 Chemotherapy induced diarrhea   . Chemotherapy induced nausea and vomiting   . Hammer toe   . Hypertension   . Liver cancer ([place])   . Neuro-endocrine carcinoma ([place])   . Obesity (BMI 30-39.9)     Past Surgical History:  Procedure Laterality Date  . ABDOMINAL HYSTERECTOMY  1995   endometriosis, heavy bleeding  . BUNIONECTOMY Bilateral   . CESAREAN SECTION  1985  . COLONOSCOPY WITH PROPOFOL N/A 10/09/2015   Procedure: COLONOSCOPY WITH PROPOFOL;  Surgeon: Robert Bellow, MD;  Location[facility] ENDOSCOPY;  Service: Endoscopy;  Laterality: N/A;  . COLONOSCOPY WITH PROPOFOL N/A 09/07/2017   Procedure: COLONOSCOPY WITH PROPOFOL;  Surgeon: Jonathon Bellows, MD;  Location: [facility] ENDOSCOPY;  Service: Gastroenterology;  Laterality: N/A;  . ESOPHAGOGASTRODUODENOSCOPY (EGD) WITH PROPOFOL N/A 09/07/2017   Procedure: ESOPHAGOGASTRODUODENOSCOPY (EGD) WITH PROPOFOL;  Surgeon: Jonathon Bellows, MD;  Location[facility] END[facility]COPY;  Service: Gastroenterology;  Laterality: N/A;  . IR ANGIOGRAM SELECTIVE EACH ADDITIONAL VESSEL  11/22/2016  . IR ANGIOGRAM SELECTIVE EACH ADDITIONAL VESSEL  11/22/2016  . IR ANGIOGRAM SELECTIVE EACH ADDITIONAL VESSEL  11/22/2016  . IR ANGIOGRAM  SELECTIVE EACH ADDITIONAL VESSEL  12/07/2016  . IR ANGIOGRAM SELECTIVE EACH ADDITIONAL VESSEL  12/07/2016  . IR ANGIOGRAM VISCERAL SELECTIVE  11/22/2016  . IR ANGIOGRAM VISCERAL SELECTIVE  12/07/2016  . IR EMBO ARTERIAL NOT HEMORR HEMANG INC GUIDE ROADMAPPING  11/22/2016  . IR EMBO TUMOR ORGAN ISCHEMIA INFARCT INC GUIDE ROADMAPPING  12/07/2016  . IR GENERIC HISTORICAL  04/07/2016   IR RADIOLOGIST EVAL & MGMT 04/07/2016 Arne Cleveland, MD [facility] INTERV RAD  . IR RADIOLOGIST EVAL & MGMT  04/14/2017  . IR US GUIDE VASC ACCESS RIGHT  11/22/2016  . IR US GUIDE VASC ACCESS RIGHT  12/07/2016  . LAPAROSCOPIC RIGHT COLECTOMY Right 10/26/2016   Procedure: LAPAROSCOPIC RIGHT COLECTOMY;  Surgeon: Robert Bellow, MD;  Location: [facility] ORS;  Service: General;  Laterality: Right;  . OOPHORECTOMY    . PORTACATH PLACEMENT Left 10/01/2015   Procedure: INSERTION PORT-A-CATH;  Surgeon: Robert Bellow, MD;  Location: [facility] ORS;  Service: General;  Laterality: Left;  . ROTATOR CUFF REPAIR Right 2008   right shoulder.  [place]     Social History   Socioeconomic History  . Marital status: Married    Spouse name: Not on file  . Number of children: Not on file  . Years of education: Not on file  . Highest education level: Not on file  Occupational History  . Not on file  Social Needs  . Financial resource strain: Not on file  . Food insecurity:    Worry: Not on file    Inability: Not on file  . Transportation  needs:    Medical: Not on file    Non-medical: Not on file  Tobacco Use  . Smoking status: Never Smoker  . Smokeless tobacco: Never Used  Substance and Sexual Activity  . Alcohol use: Yes    Alcohol/week: 0.0 standard drinks    Comment: occasionally  . Drug use: No  . Sexual activity: Not Currently  Lifestyle  . Physical activity:    Days per week: Not on file    Minutes per session: Not on file  . Stress: Not on file  Relationships  . Social connections:    Talks on phone: Not on file    Gets  together: Not on file    Attends religious service: Not on file    Active member of club or organization: Not on file    Attends meetings of clubs or organizations: Not on file    Relationship status: Not on file  . Intimate partner violence:    Fear of current or ex partner: Not on file    Emotionally abused: Not on file    Physically abused: Not on file    Forced sexual activity: Not on file  Other Topics Concern  . Not on file  Social History Narrative  . Not on file    Family History  Problem Relation Age of Onset  . Mental illness Mother 41       bipolar, dementia  . Cancer Maternal Aunt 70       breast ca  . Hypertension Father   . Stroke Father 70       deceased  . COPD Father   . Heart disease Sister        deceased     Current Outpatient Medications:  .  ALPRAZolam (XANAX) 0.25 MG tablet, TAKE 1 TABLET TWICE DAILY AS NEEDED FOR ANXIETY, Disp: 60 tablet, Rfl: 0 .  Biotin w/ Vitamins C & E (HAIR/SKIN/NAILS PO), Take 1 tablet by mouth daily., Disp: , Rfl:  .  diphenoxylate-atropine (LOMOTIL) 2.5-0.025 MG tablet, Take 1 tablet by mouth 4 (four) times daily as needed for diarrhea or loose stools., Disp: 60 tablet, Rfl: 0 .  Investigational - Study Medication, [place], [place] clinical trial, Disp: , Rfl:  .  lidocaine-prilocaine (EMLA) cream, Apply 1 application topically as needed. Apply to port a cath site 1 hour prior to chemotherapy treatments, Disp: 30 g, Rfl: 6 .  loperamide (IMODIUM A-D) 2 MG tablet, Take 2 mg by mouth 4 (four) times daily as needed for diarrhea or loose stools., Disp: , Rfl:  .  metoprolol tartrate (LOPRESSOR) 25 MG tablet, TAKE ONE TABLET BY MOUTH TWICE DAILY, Disp: 180 tablet, Rfl: 2 .  ondansetron (ZOFRAN) 8 MG tablet, Take 1 tablet (8 mg total) by mouth every 8 (eight) hours as needed for nausea or vomiting., Disp: 40 tablet, Rfl: 3 .  potassium chloride (K-DUR) 10 MEQ tablet, TAKE ONE TABLET BY MOUTH TWICE DAILY, Disp:  180 tablet, Rfl: 1 .  prochlorperazine (COMPAZINE) 10 MG tablet, TAKE ONE TABLET BY MOUTH EVERY 6 HOURS AS NEEDED FOR NAUSEA OR VOMITING, Disp: 30 tablet, Rfl: 2 .  triamcinolone ointment (KENALOG) 0.5 %, Apply 1 application topically 2 (two) times daily. To rash, Disp: 30 g, Rfl: 0 .  Chlorpheniramine Maleate (CHLOR-TABLETS PO), Take 1 tablet by mouth every evening., Disp: , Rfl:  .  pantoprazole (PROTONIX) 40 MG tablet, Take 1 tablet (40 mg total) by mouth daily. (Patient not taking: Reported on  05/08/2018), Disp: 30 tablet, Rfl: 1 No current facility-administered medications for this visit.   Facility-Administered Medications Ordered in Other Visits:  .  heparin lock flush 100 unit/mL, 500 Units, Intravenous, Once, Brahmanday, Govinda R, MD .  heparin lock flush 100 unit/mL, 500 Units, Intravenous, Once, Brahmanday, Lenetta Quaker R, MD .  sodium chloride flush (NS) 0.9 % injection 10 mL, 10 mL, Intravenous, PRN, [name], MD, 10 mL at 11/24/15 0836  Physical exam:  Vitals:   05/08/18 1026  BP: 113/80  Pulse: 78  Resp: 18  Temp: (!) 96.9 F (36.1 C)  TempSrc: Tympanic  SpO2: 99%  Weight: 193 lb (87.5 kg)   Physical Exam  Constitutional: She is oriented to person, place, and time. She appears well-developed and well-nourished.  HENT:  Head: Atraumatic.  Nose: Nose normal.  Mouth/Throat: Oropharynx is clear and moist. No oropharyngeal exudate.  Eyes: Pupils are equal, round, and reactive to light. Conjunctivae are normal. No scleral icterus.  Neck: Normal range of motion. Neck supple.  Cardiovascular: Normal rate and regular rhythm.  Pulmonary/Chest: Effort normal and breath sounds normal.  Abdominal: Soft. Bowel sounds are normal. She exhibits no distension.  Musculoskeletal: Normal range of motion. She exhibits no edema.  Neurological: She is alert and oriented to person, place, and time.  Skin: Skin is warm and dry.  Psychiatric: She has a normal mood and affect.      CMP Latest Ref Rng & Units 05/08/2018  Glucose 70 - 99 mg/dL 113(H)  BUN 8 - 23 mg/dL 14  Creatinine 0.44 - 1.00 mg/dL 1.77(H)  Sodium 135 - 145 mmol/L 136  Potassium 3.5 - 5.1 mmol/L 3.7  Chloride 98 - 111 mmol/L 108  CO2 22 - 32 mmol/L 20(L)  Calcium 8.9 - 10.3 mg/dL 9.0  Total Protein 6.5 - 8.1 g/dL 6.6  Total Bilirubin 0.3 - 1.2 mg/dL 0.4  Alkaline Phos 38 - 126 U/L 129(H)  AST 15 - 41 U/L 44(H)  ALT 0 - 44 U/L 27   CBC Latest Ref Rng & Units 05/08/2018  WBC 4.0 - 10.5 K/uL 3.0(L)  Hemoglobin 12.0 - 15.0 g/dL 11.3(L)  Hematocrit 36.0 - 46.0 % 34.1(L)  Platelets 150 - 400 K/uL 134(L)    No images are attached to the encounter.  No results found.  Assessment and plan- Patient is a 67 y.o. female diagnosed with neuroendocrine/adeno carcinoma of the colon who presents to symptom management clinic for dehydration.  1.  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  July 2019 PET scan showed evidence of progression with CEA elevated at 1200.  Lonsurf discontinued and patient was eligible for phase 1 clinical trial at [facility] + Abemaciclib trail now on trial at [facility] in [place], [place]. CEA today rising - 16,206. Liver biopsy planned for later this week and possible imaging.   2.  Chemotherapy-induced diarrhea-Per patient has been discussed and is managed by Marylou Mccoy and thought to be associated with diarrhea.  Per patient previous stool studies were negative for infection. Mag 1.6. Currently on Lomotil with improvement in symptoms. has made dietary changes with some additional improvement.  Continue Lomotil as prescribed.   3.  CKD-history of stage III chronic kidney disease.  Baseline creatinine 1.1-1.2 most recently. Today, creatinine 1.77. Suspect r/t underlying poor oral intake, diarrhea.  IV fluids given in clinic today and recommended increasing fluid intake.  She is scheduled to be seen at  [facility] later this week for liver biopsy.  Recommend rechecking labs at that visit for later this week if persistent diarrhea and possible imaging +/- IV fluids.   4.  Anxiety-associated with cancer diagnosis well controlled with current regimen.  Continue Xanax as prescribed.  5.  Poor oral intake-related to diarrhea.  Currently on brat diet with improvement in symptoms.  Approximately 7 pound weight loss in the past 4 months.  Patient discussed with Jennet Maduro, RD who will see patient today for evaluation and discussion of oral nutrition supplements and alternative food choices.  Follow-up with [place] later this week as scheduled or sooner if symptoms persist. Not currently scheduled for follow-up with Dr. Rogue Bussing but can rtc as needed.   Visit Diagnosis 1. Diarrhea, unspecified type   2. Malignant neoplasm of ascending colon ([place])   3. AKI (acute kidney injury) ([place])   4. Anxiety associated with cancer diagnosis     Patient expressed understanding and was in agreement with this plan. She also understands that She can call clinic at any time with any questions, concerns, or complaints.   Thank you for allowing me to participate in the care of this very pleasant patient.   Beckey Rutter, DNP, AGNP-C [place] at [facility] [phone] (work cell) [phone] (office)

## 2018-05-09 LAB — CEA: CEA1: 16206 ng/mL — AB (ref 0.0–4.7)

## 2018-05-10 ENCOUNTER — Telehealth: Payer: Self-pay | Admitting: Internal Medicine

## 2018-05-10 NOTE — Telephone Encounter (Signed)
Spoke to pt re: results of creatinine-1.7; and also discussed re: elevated CEA at 16,0000.  Commend increase fluid intake.  Patient currently [place]/Kathleen James; awaiting liver biopsy tomorrow.  Plan for imaging first week of 2020

## 2018-05-12 ENCOUNTER — Other Ambulatory Visit: Payer: Self-pay | Admitting: *Deleted

## 2018-05-12 ENCOUNTER — Telehealth: Payer: Self-pay

## 2018-05-12 DIAGNOSIS — R7989 Other specified abnormal findings of blood chemistry: Secondary | ICD-10-CM

## 2018-05-12 NOTE — Telephone Encounter (Signed)
I received a phone call from nurse names Baxter Flattery in [place], where patient is receiving clinical trial. Per Baxter Flattery, patient's creatinine level is elevated, and patient would benefit from 1 liter of fluids on Monday. Per Dr. Rogue Bussing, have patient added on to [facility] clinic on Monday 05/15/18 for lab work and 1 liter of fluids.

## 2018-05-15 ENCOUNTER — Encounter: Payer: Self-pay | Admitting: Oncology

## 2018-05-15 ENCOUNTER — Other Ambulatory Visit: Payer: Self-pay

## 2018-05-15 ENCOUNTER — Encounter: Payer: Self-pay | Admitting: Nurse Practitioner

## 2018-05-15 ENCOUNTER — Inpatient Hospital Stay: Payer: Medicare HMO

## 2018-05-15 ENCOUNTER — Other Ambulatory Visit: Payer: Self-pay | Admitting: Internal Medicine

## 2018-05-15 ENCOUNTER — Inpatient Hospital Stay (HOSPITAL_BASED_OUTPATIENT_CLINIC_OR_DEPARTMENT_OTHER): Payer: Medicare HMO | Admitting: Oncology

## 2018-05-15 DIAGNOSIS — D702 Other drug-induced agranulocytosis: Secondary | ICD-10-CM

## 2018-05-15 DIAGNOSIS — C182 Malignant neoplasm of ascending colon: Secondary | ICD-10-CM | POA: Diagnosis not present

## 2018-05-15 DIAGNOSIS — R5381 Other malaise: Secondary | ICD-10-CM | POA: Diagnosis not present

## 2018-05-15 DIAGNOSIS — F419 Anxiety disorder, unspecified: Secondary | ICD-10-CM

## 2018-05-15 DIAGNOSIS — R5383 Other fatigue: Secondary | ICD-10-CM | POA: Diagnosis not present

## 2018-05-15 DIAGNOSIS — Z79899 Other long term (current) drug therapy: Secondary | ICD-10-CM

## 2018-05-15 DIAGNOSIS — C787 Secondary malignant neoplasm of liver and intrahepatic bile duct: Secondary | ICD-10-CM

## 2018-05-15 DIAGNOSIS — N179 Acute kidney failure, unspecified: Secondary | ICD-10-CM

## 2018-05-15 DIAGNOSIS — E860 Dehydration: Secondary | ICD-10-CM

## 2018-05-15 DIAGNOSIS — T451X5S Adverse effect of antineoplastic and immunosuppressive drugs, sequela: Secondary | ICD-10-CM | POA: Diagnosis not present

## 2018-05-15 DIAGNOSIS — R634 Abnormal weight loss: Secondary | ICD-10-CM

## 2018-05-15 DIAGNOSIS — Z95828 Presence of other vascular implants and grafts: Secondary | ICD-10-CM

## 2018-05-15 DIAGNOSIS — Z9221 Personal history of antineoplastic chemotherapy: Secondary | ICD-10-CM

## 2018-05-15 DIAGNOSIS — M1990 Unspecified osteoarthritis, unspecified site: Secondary | ICD-10-CM

## 2018-05-15 DIAGNOSIS — K521 Toxic gastroenteritis and colitis: Secondary | ICD-10-CM | POA: Diagnosis not present

## 2018-05-15 DIAGNOSIS — N183 Chronic kidney disease, stage 3 (moderate): Secondary | ICD-10-CM

## 2018-05-15 DIAGNOSIS — I129 Hypertensive chronic kidney disease with stage 1 through stage 4 chronic kidney disease, or unspecified chronic kidney disease: Secondary | ICD-10-CM

## 2018-05-15 LAB — COMPREHENSIVE METABOLIC PANEL
ALBUMIN: 2.9 g/dL — AB (ref 3.5–5.0)
ALK PHOS: 125 U/L (ref 38–126)
ALT: 32 U/L (ref 0–44)
ANION GAP: 7 (ref 5–15)
AST: 51 U/L — ABNORMAL HIGH (ref 15–41)
BUN: 18 mg/dL (ref 8–23)
CALCIUM: 9 mg/dL (ref 8.9–10.3)
CO2: 19 mmol/L — AB (ref 22–32)
Chloride: 110 mmol/L (ref 98–111)
Creatinine, Ser: 2.29 mg/dL — ABNORMAL HIGH (ref 0.44–1.00)
GFR calc non Af Amer: 21 mL/min — ABNORMAL LOW (ref >60)
GFR, EST AFRICAN AMERICAN: 24 mL/min — AB (ref >60)
GLUCOSE: 105 mg/dL — AB (ref 70–99)
POTASSIUM: 4 mmol/L (ref 3.5–5.1)
SODIUM: 136 mmol/L (ref 135–145)
TOTAL PROTEIN: 6.2 g/dL — AB (ref 6.5–8.1)
Total Bilirubin: 0.6 mg/dL (ref 0.3–1.2)

## 2018-05-15 LAB — MAGNESIUM: Magnesium: 1.5 mg/dL — ABNORMAL LOW (ref 1.7–2.4)

## 2018-05-15 LAB — CBC WITH DIFFERENTIAL/PLATELET
Abs Immature Granulocytes: 0 10*3/uL (ref 0.00–0.07)
BASOS ABS: 0 10*3/uL (ref 0.0–0.1)
BASOS PCT: 2 %
EOS ABS: 0.2 10*3/uL (ref 0.0–0.5)
Eosinophils Relative: 6 %
HCT: 31.8 % — ABNORMAL LOW (ref 36.0–46.0)
Hemoglobin: 10.8 g/dL — ABNORMAL LOW (ref 12.0–15.0)
IMMATURE GRANULOCYTES: 0 %
LYMPHS ABS: 0.8 10*3/uL (ref 0.7–4.0)
Lymphocytes Relative: 31 %
MCH: 32.6 pg (ref 26.0–34.0)
MCHC: 34 g/dL (ref 30.0–36.0)
MCV: 96.1 fL (ref 80.0–100.0)
Monocytes Absolute: 0.1 10*3/uL (ref 0.1–1.0)
Monocytes Relative: 5 %
NEUTROS PCT: 56 %
NRBC: 0 % (ref 0.0–0.2)
Neutro Abs: 1.3 10*3/uL — ABNORMAL LOW (ref 1.7–7.7)
Platelets: 137 10*3/uL — ABNORMAL LOW (ref 150–400)
RBC: 3.31 MIL/uL — ABNORMAL LOW (ref 3.87–5.11)
RDW: 14.3 % (ref 11.5–15.5)
WBC: 2.4 10*3/uL — ABNORMAL LOW (ref 4.0–10.5)

## 2018-05-15 MED ORDER — SODIUM CHLORIDE 0.9% FLUSH
10.0000 mL | INTRAVENOUS | Status: DC | PRN
  Administered 2018-05-15: 10 mL via INTRAVENOUS
  Filled 2018-05-15: qty 10

## 2018-05-15 MED ORDER — MAGNESIUM SULFATE 2 GM/50ML IV SOLN
2.0000 g | Freq: Once | INTRAVENOUS | Status: AC
  Administered 2018-05-15: 2 g via INTRAVENOUS
  Filled 2018-05-15: qty 50

## 2018-05-15 MED ORDER — SODIUM CHLORIDE 0.9 % IV SOLN
Freq: Once | INTRAVENOUS | Status: AC
  Administered 2018-05-15: 09:00:00 via INTRAVENOUS
  Filled 2018-05-15: qty 250

## 2018-05-15 MED ORDER — SODIUM CHLORIDE 0.9 % IV SOLN: 2.0000 g | Freq: Once | INTRAVENOUS | Status: DC

## 2018-05-15 MED ORDER — HEPARIN SOD (PORK) LOCK FLUSH 100 UNIT/ML IV SOLN
500.0000 [IU] | Freq: Once | INTRAVENOUS | Status: AC
  Administered 2018-05-15: 500 [IU] via INTRAVENOUS

## 2018-05-15 NOTE — Progress Notes (Signed)
Symptom Management Consult note [facility]  [phone] Fax:([phone]  [name], [name], MD (Internal Medicine) Bary Castilla, Forest Gleason, [name], RN as Registered Nurse [name], MD as Consulting Physician (Internal Medicine)   Name of the patient: Kathleen James  [id number]  01/12/1951   Date of visit: 05/15/2018   Diagnosis: Metastatic B-RAF mutated neuroendocrine adenocarcinoma cancer of the colon.   1. Neutropenia, drug-induced (HCC) - CBC with Differential/Platelet  2. Malignant neoplasm of ascending colon (HCC) - CBC with Differential/Platelet  3. Hypomagnesemia - Magnesium; Future   Chief Complaint: Dehydration secondary to diarrhea  Current Treatment: Phase 1 clinical trial at [facility]. ERK + Abemaciclib.   Oncology History: Last seen by primary MD Dr. Rogue Bussing on 02/15/2018 for follow-up with history of metastatic neuroendocrine/adenocarcinoma of the ascending colon.  She had recently been evaluated by [facility] for phase 1 clinical trial.  Opted for clinical trial that was local [place] in [place] d/t proximity to home.  She admitted to mild fatigue but otherwise felt well.  She was just seen and evaluated by MD at [facility] last week which revealed elevated creatinine.  She received 1 L NaCl last week and was instructed to RTC in [place] on Monday for additional fluids and labs.   Seen and evaluated in [facility] on 05/08/2018 by Beckey Rutter, NP for diarrhea  And dehydration.  She was given IV fluids and encouraged to increase fluid intake.  Baseline creatinine was noted to be 1.1-1.2.   Had PET scan on 01/17/18 after 3 cycles of Lonsurf unfortunately revealing progression of disease.  Discontinued Lonsurf.  Oncology History     # MARCH/APRIL 2017-NEUROENDOCRINE CA STAGE IV; [name]; [name]; [name]; [name]; [name]; [name]; [name]; July 14th  2017 CT- PR  # AUG 28th- FOLFIRI +Avastin; OCT 5th CT scan- PR.   # MARCH 20th- CT STABLE liver lesions; ? Enlarging cecal lesion [CEA rising]; March 26th- FOLFOX [last avastin march 12th 2018];   # resection of primary tumor [Dr.Byrnett]; Y 90- June 19th 2018.  # July 11th-FOLFOX + Avastin; SEP 2018 [CT- stable liver lesions; increasing ~37mm RP LN]; RISING CEA [stopped end of Sep 2018 ]  # OCT 15th 2018- Vemu+ Iri+ Pan [mid nov stopped iri- neutropenia/dia+vemu- arthralgias]; 05/11/2017-Started enco+Bini+vectibex; April 28th PET- PROGRESSION  # May 2019- Lonsurf; End of July 2019- PET Progression; Stopped Lonserf  # acute lower GIB-EGD/ colonoscopy [Dr.Anna]- - ulcerated Lesion along the anastomotic line;  biopsy negative for recurrence.    # FOUNDATION ONE- B-RAF V600E- MUTATED [May 9528]  # right kidney lesion 2.6 x 1.6 cm ? Angiolipoma [followed by Urology in past]  ------------------------------------------------------------    DIAGNOSIS: [Jan 2017 ]-right-sided colon cancer [neuroendocrine/adeno CA; B-RAF mutated]  STAGE:  IV  ;GOALS: PALLIATIVE  CURRENT/MOST RECENT THERAPY [may 15th 2019 ]- lonsurf      Malignant neoplasm of ascending colon ([place])    Subjective Data:  Subjective:     Kathleen James is a 67 y.o. female who presents for evaluation of diarrhea. Onset of diarrhea was a few months ago. Diarrhea is occurring  approximately 4 times per day. Patient describes diarrhea as watery. Diarrhea has been associated with current oral chemotherapy. Patient denies blood in stool, fever, recent antibiotic use,  significant abdominal pain. Previous visits for diarrhea: yes, last seen 1 month ago by Beckey Rutter, NP. Evaluation to date: CBC, electrolytes, BUN and creatinine and stool cultures.  Treatment to date: Lomotil and immodium.  The following portions of the patient's history were reviewed and updated as appropriate: allergies, current medications, past family history, past medical history, past social history, past surgical history and problem list.  Review of Systems A comprehensive review of systems was negative except for: Constitutional: positive for fatigue Gastrointestinal: positive for diarrhea Musculoskeletal: positive for muscle weakness    Objective:    BP 124/83 (BP Location: Left Arm, Patient Position: Sitting) Comment: standing bp 100/65  Pulse 78 Comment: standing pulse 87  Temp 97.7 F (36.5 C) (Tympanic)   Resp 16  General: alert, fatigued and no distress  Hydration:  mildly dehydrated  Abdomen:    soft, non-tender; bowel sounds normal; no masses,  no organomegaly    Assessment:    Diarrhea d/t oral chemotherapy.    Plan:    Discussed the appropriate management of diarrhea. Lab studies per orders. BRAT diet    Metastatic B-RAF mutated neuroendocrine/adenocarcinoma cancer of the colon: S/p 3 cycles of Lonsurf.  Recent PET scan from July 2019 unfortunately revealed progression.  Discontinued Lonsurf.  Began phase 1 clinical trial with ERK + Abemaciclib in September 2019 under management of Dr. Selinda Flavin.  Scheduled to return for further work-up and evaluation on 05/21/2018.   Chronic diarrhea: Secondary to clinical trial medications.  Previous work-up was negative for infection. Seen in [facility] for dehydration due to diarrhea on 05/05/18 by Beckey Rutter, NP.  Instructed to continue Lomotil/Imodium and to adhere to the brat diet with increased oral intake.   Acute Kidney Injury: Worsening kidney function.  Historically, has improved with IV fluids.  Will have her return to clinic  on Wednesday, 05/17/2018 with labs and possible IV fluids +/- magnesium.    Plan: Stat labs.  Hypomagnesemia (1.5), hypoalbuminemia (2.9).  Give 1 L NaCl.  Orthostatics improved with fluids. Give 2 g magnesium.   RTC on Wednesday for assessment and labs.   Greater than 50% was spent in counseling and coordination of care with this patient including but not limited to discussion of the relevant topics above (See A&P) including, but not limited to diagnosis and management of acute and chronic medical conditions.   Faythe Casa, NP 05/15/2018 3:40 PM

## 2018-05-17 ENCOUNTER — Inpatient Hospital Stay: Payer: Medicare HMO

## 2018-05-17 DIAGNOSIS — T451X5S Adverse effect of antineoplastic and immunosuppressive drugs, sequela: Secondary | ICD-10-CM | POA: Diagnosis not present

## 2018-05-17 DIAGNOSIS — R112 Nausea with vomiting, unspecified: Secondary | ICD-10-CM

## 2018-05-17 DIAGNOSIS — Z9221 Personal history of antineoplastic chemotherapy: Secondary | ICD-10-CM | POA: Diagnosis not present

## 2018-05-17 DIAGNOSIS — K521 Toxic gastroenteritis and colitis: Secondary | ICD-10-CM | POA: Diagnosis not present

## 2018-05-17 DIAGNOSIS — R5381 Other malaise: Secondary | ICD-10-CM | POA: Diagnosis not present

## 2018-05-17 DIAGNOSIS — C787 Secondary malignant neoplasm of liver and intrahepatic bile duct: Secondary | ICD-10-CM | POA: Diagnosis not present

## 2018-05-17 DIAGNOSIS — C182 Malignant neoplasm of ascending colon: Secondary | ICD-10-CM | POA: Diagnosis not present

## 2018-05-17 DIAGNOSIS — E860 Dehydration: Secondary | ICD-10-CM

## 2018-05-17 DIAGNOSIS — Z95828 Presence of other vascular implants and grafts: Secondary | ICD-10-CM

## 2018-05-17 DIAGNOSIS — R5383 Other fatigue: Secondary | ICD-10-CM | POA: Diagnosis not present

## 2018-05-17 DIAGNOSIS — D702 Other drug-induced agranulocytosis: Secondary | ICD-10-CM | POA: Diagnosis not present

## 2018-05-17 MED ORDER — SODIUM CHLORIDE 0.9% FLUSH
10.0000 mL | INTRAVENOUS | Status: DC | PRN
  Administered 2018-05-17: 10 mL via INTRAVENOUS
  Filled 2018-05-17: qty 10

## 2018-05-17 MED ORDER — ONDANSETRON HCL 4 MG/2ML IJ SOLN
INTRAMUSCULAR | Status: AC
  Filled 2018-05-17: qty 4

## 2018-05-17 MED ORDER — SODIUM CHLORIDE 0.9 % IV SOLN
Freq: Once | INTRAVENOUS | Status: AC
  Administered 2018-05-17: 10:00:00 via INTRAVENOUS
  Filled 2018-05-17: qty 250

## 2018-05-17 MED ORDER — HEPARIN SOD (PORK) LOCK FLUSH 100 UNIT/ML IV SOLN
500.0000 [IU] | Freq: Once | INTRAVENOUS | Status: AC
  Administered 2018-05-17: 500 [IU] via INTRAVENOUS
  Filled 2018-05-17: qty 5

## 2018-05-17 MED ORDER — ONDANSETRON HCL 4 MG/2ML IJ SOLN
8.0000 mg | Freq: Once | INTRAMUSCULAR | Status: AC
  Administered 2018-05-17: 8 mg via INTRAVENOUS

## 2018-05-22 DIAGNOSIS — Z006 Encounter for examination for normal comparison and control in clinical research program: Secondary | ICD-10-CM | POA: Diagnosis not present

## 2018-05-22 DIAGNOSIS — C189 Malignant neoplasm of colon, unspecified: Secondary | ICD-10-CM | POA: Diagnosis not present

## 2018-05-23 DIAGNOSIS — C189 Malignant neoplasm of colon, unspecified: Secondary | ICD-10-CM | POA: Diagnosis not present

## 2018-05-23 DIAGNOSIS — R7989 Other specified abnormal findings of blood chemistry: Secondary | ICD-10-CM | POA: Diagnosis not present

## 2018-05-23 DIAGNOSIS — N9489 Other specified conditions associated with female genital organs and menstrual cycle: Secondary | ICD-10-CM | POA: Diagnosis not present

## 2018-05-23 DIAGNOSIS — N2889 Other specified disorders of kidney and ureter: Secondary | ICD-10-CM | POA: Diagnosis not present

## 2018-05-24 DIAGNOSIS — R82998 Other abnormal findings in urine: Secondary | ICD-10-CM | POA: Diagnosis not present

## 2018-05-24 DIAGNOSIS — Z006 Encounter for examination for normal comparison and control in clinical research program: Secondary | ICD-10-CM | POA: Diagnosis not present

## 2018-05-24 DIAGNOSIS — C189 Malignant neoplasm of colon, unspecified: Secondary | ICD-10-CM | POA: Diagnosis not present

## 2018-05-29 ENCOUNTER — Encounter: Payer: Self-pay | Admitting: Oncology

## 2018-05-29 ENCOUNTER — Inpatient Hospital Stay: Payer: Medicare HMO

## 2018-05-29 ENCOUNTER — Inpatient Hospital Stay: Payer: Medicare HMO | Attending: Nurse Practitioner

## 2018-05-29 ENCOUNTER — Inpatient Hospital Stay (HOSPITAL_BASED_OUTPATIENT_CLINIC_OR_DEPARTMENT_OTHER): Payer: Medicare HMO | Admitting: Oncology

## 2018-05-29 VITALS — BP 138/92 | HR 89 | Temp 98.4°F | Resp 18 | Wt 187.0 lb

## 2018-05-29 DIAGNOSIS — E860 Dehydration: Secondary | ICD-10-CM | POA: Insufficient documentation

## 2018-05-29 DIAGNOSIS — C182 Malignant neoplasm of ascending colon: Secondary | ICD-10-CM | POA: Insufficient documentation

## 2018-05-29 DIAGNOSIS — R197 Diarrhea, unspecified: Secondary | ICD-10-CM | POA: Insufficient documentation

## 2018-05-29 DIAGNOSIS — N179 Acute kidney failure, unspecified: Secondary | ICD-10-CM | POA: Diagnosis not present

## 2018-05-29 DIAGNOSIS — C787 Secondary malignant neoplasm of liver and intrahepatic bile duct: Secondary | ICD-10-CM | POA: Insufficient documentation

## 2018-05-29 DIAGNOSIS — R7989 Other specified abnormal findings of blood chemistry: Secondary | ICD-10-CM

## 2018-05-29 DIAGNOSIS — D702 Other drug-induced agranulocytosis: Secondary | ICD-10-CM

## 2018-05-29 LAB — COMPREHENSIVE METABOLIC PANEL
ALBUMIN: 2.7 g/dL — AB (ref 3.5–5.0)
ALK PHOS: 126 U/L (ref 38–126)
ALT: 37 U/L (ref 0–44)
AST: 44 U/L — AB (ref 15–41)
Anion gap: 8 (ref 5–15)
BILIRUBIN TOTAL: 0.7 mg/dL (ref 0.3–1.2)
BUN: 14 mg/dL (ref 8–23)
CO2: 20 mmol/L — AB (ref 22–32)
Calcium: 8.6 mg/dL — ABNORMAL LOW (ref 8.9–10.3)
Chloride: 109 mmol/L (ref 98–111)
Creatinine, Ser: 1.71 mg/dL — ABNORMAL HIGH (ref 0.44–1.00)
GFR calc Af Amer: 35 mL/min — ABNORMAL LOW (ref >60)
GFR calc non Af Amer: 30 mL/min — ABNORMAL LOW (ref >60)
GLUCOSE: 122 mg/dL — AB (ref 70–99)
POTASSIUM: 3.6 mmol/L (ref 3.5–5.1)
SODIUM: 137 mmol/L (ref 135–145)
TOTAL PROTEIN: 6.7 g/dL (ref 6.5–8.1)

## 2018-05-29 LAB — CBC WITH DIFFERENTIAL/PLATELET
ABS IMMATURE GRANULOCYTES: 0.02 10*3/uL (ref 0.00–0.07)
Basophils Absolute: 0 10*3/uL (ref 0.0–0.1)
Basophils Relative: 1 %
Eosinophils Absolute: 0.4 10*3/uL (ref 0.0–0.5)
Eosinophils Relative: 9 %
HEMATOCRIT: 29.3 % — AB (ref 36.0–46.0)
HEMOGLOBIN: 9.8 g/dL — AB (ref 12.0–15.0)
IMMATURE GRANULOCYTES: 0 %
LYMPHS ABS: 0.3 10*3/uL — AB (ref 0.7–4.0)
Lymphocytes Relative: 6 %
MCH: 31.9 pg (ref 26.0–34.0)
MCHC: 33.4 g/dL (ref 30.0–36.0)
MCV: 95.4 fL (ref 80.0–100.0)
MONO ABS: 0.3 10*3/uL (ref 0.1–1.0)
MONOS PCT: 5 %
NEUTROS ABS: 3.9 10*3/uL (ref 1.7–7.7)
NEUTROS PCT: 79 %
PLATELETS: 105 10*3/uL — AB (ref 150–400)
RBC: 3.07 MIL/uL — ABNORMAL LOW (ref 3.87–5.11)
RDW: 17.4 % — ABNORMAL HIGH (ref 11.5–15.5)
WBC: 4.9 10*3/uL (ref 4.0–10.5)
nRBC: 0 % (ref 0.0–0.2)

## 2018-05-29 MED ORDER — HEPARIN SOD (PORK) LOCK FLUSH 100 UNIT/ML IV SOLN
500.0000 [IU] | Freq: Once | INTRAVENOUS | Status: AC
  Administered 2018-05-29: 500 [IU] via INTRAVENOUS

## 2018-05-29 MED ORDER — SODIUM CHLORIDE 0.9 % IV SOLN
Freq: Once | INTRAVENOUS | Status: AC
  Administered 2018-05-29: 09:00:00 via INTRAVENOUS
  Filled 2018-05-29: qty 250

## 2018-05-29 MED ORDER — SODIUM CHLORIDE 0.9% FLUSH
10.0000 mL | Freq: Once | INTRAVENOUS | Status: AC
  Administered 2018-05-29: 10 mL via INTRAVENOUS
  Filled 2018-05-29: qty 10

## 2018-05-29 NOTE — Progress Notes (Addendum)
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  ERK+ Abemaciclib.   Oncology History: Patient was last seen and evaluated by primary medical oncologist Kathleen James on 02/15/2018 for follow-up with history of metastatic neuroendocrine adenocarcinoma of the ascending colon.  She had recently been evaluated by MD Kathleen James for phase 1 clinical trial.  Opted for clinical trial that was closer to home; [place] in [place].  Admitted to mild fatigue but otherwise felt well.  Patient was recently evaluated by her clinical trial MD, Kathleen James last week where creatinine continued to worsen.  Both oral ERK and Abema were held. Patient states creatinine was as high as 2.71 at last lab draw.  Scheduled to reassess on 06/02/2018 with lab work and reinitiation of oral chemotherapy.  Has required IV fluids often with current treatment for dehydration d/t diarrhea.  Received IV fluids on 05/15/2018 and 05/17/2018.  Found to have an elevated creatinine requiring several liters of fluid.  Baseline creatinine was noted to be 1.1-1.2.  Imaging from 01/17/18 after 3 cycles of Lonsurf unfortunately revealed progression of disease.  Discontinued Lonsurf.  Oncology History   # MARCH/APRIL 2017-NEUROENDOCRINE CA STAGE IV; [name]; Colo Bx-  KathleenByrnett]- Multiple liver lesions [largest- 8.4x5.4x5.9; [name]; [name]; [name]; [name]; [name]; July 14th  2017 CT- PR  # AUG 28th- FOLFIRI +Avastin; OCT 5th CT scan- PR.   # MARCH 20th- CT STABLE liver lesions; ? Enlarging cecal lesion [CEA rising]; March 26th- FOLFOX [last avastin march 12th 2018];   # resection of primary tumor [KathleenByrnett]; Y 90- June 19th 2018.  # July 11th-FOLFOX + Avastin; SEP 2018 [CT- stable liver lesions; increasing ~58mm RP LN]; RISING CEA [stopped end of Sep 2018 ]  # OCT 15th 2018- Vemu+ Iri+ Pan [mid nov stopped iri- neutropenia/dia+vemu- arthralgias]; 05/11/2017-Started enco+Bini+vectibex; April 28th PET- PROGRESSION  # May 2019- Lonsurf; End of July 2019- PET Progression; Stopped Lonserf  # acute lower GIB-EGD/ colonoscopy [KathleenAnna]- - ulcerated Lesion along the anastomotic line;  biopsy negative for recurrence.    # FOUNDATION ONE- B-RAF V600E- MUTATED [May 8101]  # right kidney lesion 2.6 x 1.6 cm ? Angiolipoma [followed by Urology in past]  ------------------------------------------------------------    DIAGNOSIS: [Jan 2017 ]-right-sided colon cancer [neuroendocrine/adeno CA; B-RAF mutated]  STAGE:  IV  ;GOALS: PALLIATIVE  CURRENT/MOST RECENT THERAPY [may 15th 2019 ]- lonsurf      Malignant neoplasm of ascending colon ([place])    Subjective Data:  ECOG: 1 - Symptomatic but completely ambulatory  Subjective:     Kathleen James is a 67 y.o. female who presents for evaluation of diarrhea and dehydration.  It has been present for 2 months.  Associated signs & symptoms:  diarrhea occurring 6 times daily and Lack of  appetite and early satiety.  She was seen on 05/15/2018 for  IV fluids. Had recently returned from [place] and was found to have an elevated creatinine. She  was encouraged to come see [place] for fluids.  Creatinine improved.  Evaluated last Wednesday (05/21/2018) where creatinine was again significantly elevated, as high as 2.71. Oral chemotherapy held and she was encouraged to have labs rechecked with possible fluids today.  Scheduled to return to [place] later this week in hopes to reinitiate oral chemotherapy.  The following portions of the patient's history were reviewed and updated as appropriate: allergies, current medications, past family history, past medical history, past social history, past surgical history and problem list. Review of Systems A comprehensive review of systems was negative except for: Constitutional: positive for anorexia, fatigue and malaise Cardiovascular: positive for fatigue Gastrointestinal: positive for diarrhea and nausea Musculoskeletal: positive for muscle weakness       Objective:    BP (!) 138/92 (BP Location: Right Arm, Patient Position: Sitting) Comment: standing bp 131/88  Pulse 89 Comment: standing 93  Temp 98.4 F (36.9 C) (Oral)   Resp 18   Wt 187 lb (84.8 kg)   SpO2 98%   BMI 32.10 kg/m  General appearance: alert, fatigued and no distress Lungs: clear to auscultation bilaterally Heart: regular rate and rhythm, S1, S2 normal, no murmur, click, rub or gallop Abdomen: soft, non-tender; bowel sounds normal; no masses,  no organomegaly    Assessment:     Dehydration and elevated creatinine d/t decrease oral intake and chemotherapy.   Plan:    Antiemetics per medication orders. IV fluids per orders. Labs per orders.    Metastatic B-RAF mutated neuroendocrine/adenocarcinoma cancer of the colon: S/p 3 cycles of Lonsurf.  Imaging from July 2019 unfortunately revealed progression.  Discontinued Lonsurf.  Began phase 1 clinical trial with ERK + Abemaciclib in September 2019 under management of Kathleen James.  Was seen last week for lab work and assessment and found to have worsening kidney function.  Oral  chemotherapy was stopped.  Plan is for her to return to [place] this Friday for assessment including labs with the hope to reinitiate oral chemotherapy.  Acute kidney injury: Worsening kidney function.  Labs today show improvement of creatinine.  She does not appear to be orthostatic or significantly dehydrated.  Dehydration: d/t diarrhea secondary to chemotherapy.  Encouraged patient to drink plenty of fluids.   Plan: 1 L NaCl. Labs.  Improvement of creatinine.  1.71 today.  AST elevated but stable.  GFR improved.  Slightly more anemic hemoglobin 9.8.  No active bleeding.  Likely due to chemotherapy. Hopefully labs will continue to improve while chemotherapy on hold. Labs will be rechecked on Friday, 06/02/2018 at [facility] prior to reinitiating chemo. Has CT scans scheduled for 06/23/2018.  Patient knows she can contact us with any problems or concerns.   Greater than 50% was spent in counseling and coordination of care with this patient including but not limited to discussion of the relevant topics above (See A&P) including, but not limited to diagnosis and management of acute and chronic medical conditions.   Faythe Casa, NP 05/29/2018 1:36 PM

## 2018-05-30 DIAGNOSIS — H524 Presbyopia: Secondary | ICD-10-CM | POA: Diagnosis not present

## 2018-06-01 ENCOUNTER — Telehealth: Payer: Self-pay | Admitting: *Deleted

## 2018-06-01 DIAGNOSIS — C182 Malignant neoplasm of ascending colon: Secondary | ICD-10-CM

## 2018-06-01 DIAGNOSIS — R7989 Other specified abnormal findings of blood chemistry: Secondary | ICD-10-CM

## 2018-06-01 NOTE — Telephone Encounter (Signed)
Incoming fax from [place] oncology to request a CMP to be drawn tomorrow. And fax results to [phone]. I spoke with patient to inform her that her oncology office faxed over this order. She states that the oncologist in [place] are holding chemo at this time due to elevated creatinine. She was also notified of this lab draw. I asked the patient to come in at 9 am and she agreed to wait on the lab results to determine if anything further needed to be done in regarding to hydration.

## 2018-06-02 ENCOUNTER — Inpatient Hospital Stay: Payer: Medicare HMO

## 2018-06-02 ENCOUNTER — Inpatient Hospital Stay (HOSPITAL_BASED_OUTPATIENT_CLINIC_OR_DEPARTMENT_OTHER): Payer: Medicare HMO | Admitting: Oncology

## 2018-06-02 ENCOUNTER — Other Ambulatory Visit: Payer: Self-pay

## 2018-06-02 VITALS — BP 180/116 | HR 76 | Resp 18 | Ht 64.0 in | Wt 187.0 lb

## 2018-06-02 DIAGNOSIS — E860 Dehydration: Secondary | ICD-10-CM | POA: Diagnosis not present

## 2018-06-02 DIAGNOSIS — C182 Malignant neoplasm of ascending colon: Secondary | ICD-10-CM | POA: Diagnosis not present

## 2018-06-02 DIAGNOSIS — C787 Secondary malignant neoplasm of liver and intrahepatic bile duct: Secondary | ICD-10-CM | POA: Diagnosis not present

## 2018-06-02 DIAGNOSIS — N179 Acute kidney failure, unspecified: Secondary | ICD-10-CM

## 2018-06-02 DIAGNOSIS — R7989 Other specified abnormal findings of blood chemistry: Secondary | ICD-10-CM

## 2018-06-02 DIAGNOSIS — Z95828 Presence of other vascular implants and grafts: Secondary | ICD-10-CM

## 2018-06-02 DIAGNOSIS — R197 Diarrhea, unspecified: Secondary | ICD-10-CM | POA: Diagnosis not present

## 2018-06-02 LAB — COMPREHENSIVE METABOLIC PANEL
ALT: 30 U/L (ref 0–44)
AST: 47 U/L — ABNORMAL HIGH (ref 15–41)
Albumin: 2.8 g/dL — ABNORMAL LOW (ref 3.5–5.0)
Alkaline Phosphatase: 136 U/L — ABNORMAL HIGH (ref 38–126)
Anion gap: 10 (ref 5–15)
BUN: 19 mg/dL (ref 8–23)
CHLORIDE: 106 mmol/L (ref 98–111)
CO2: 21 mmol/L — ABNORMAL LOW (ref 22–32)
Calcium: 8.7 mg/dL — ABNORMAL LOW (ref 8.9–10.3)
Creatinine, Ser: 1.58 mg/dL — ABNORMAL HIGH (ref 0.44–1.00)
GFR calc Af Amer: 39 mL/min — ABNORMAL LOW (ref >60)
GFR calc non Af Amer: 34 mL/min — ABNORMAL LOW (ref >60)
Glucose, Bld: 120 mg/dL — ABNORMAL HIGH (ref 70–99)
POTASSIUM: 3.7 mmol/L (ref 3.5–5.1)
Sodium: 137 mmol/L (ref 135–145)
Total Bilirubin: 0.7 mg/dL (ref 0.3–1.2)
Total Protein: 7.1 g/dL (ref 6.5–8.1)

## 2018-06-02 MED ORDER — SODIUM CHLORIDE 0.9 % IV SOLN
INTRAVENOUS | Status: DC
  Administered 2018-06-02: 10:00:00 via INTRAVENOUS
  Filled 2018-06-02 (×2): qty 250

## 2018-06-02 MED ORDER — AMLODIPINE BESYLATE 10 MG PO TABS: 10.0000 mg | ORAL_TABLET | Freq: Every day | ORAL | 0 refills | Status: DC

## 2018-06-02 MED ORDER — SODIUM CHLORIDE 0.9% FLUSH
10.0000 mL | Freq: Once | INTRAVENOUS | Status: AC
  Administered 2018-06-02: 10 mL via INTRAVENOUS
  Filled 2018-06-02: qty 10

## 2018-06-02 MED ORDER — HEPARIN SOD (PORK) LOCK FLUSH 100 UNIT/ML IV SOLN
500.0000 [IU] | Freq: Once | INTRAVENOUS | Status: AC
  Administered 2018-06-02: 500 [IU]
  Filled 2018-06-02: qty 5

## 2018-06-02 NOTE — Progress Notes (Signed)
Patient presents to clinic today with creatinine of 1.58. patient feels weak and requested IV fluids today. I spoke with Ander Purpura, NP. Patient added to [facility] clinic for 1 liter of fluids at 999 ml/hour. Patient's BP today is 173/105. NP made aware of patient's BP reading today. Patient did take her metoprolol this am before coming to the clinic.  [06/02/2018 10:05 AM]  Beckey Rutter:  Per Ander Purpura, "if still elevated after fluids she will need to call her pcp or cardiologist to discuss and have medication adjusted."   1058-Post fluid vitals - 180/116. Lauren, NP made aware. I also spoke with Dr. Rogue Bussing. Md would like NP to see patient today given BP concerns. Patient's PCP is Dr. Derrel Nip. Per patient, she has not seen her pcp in approx. 2 years. Dr. B has been prescribing pt's bp medications. Pt does not have cardiologists. Rulon Abide, NP - will see the patient today in [facility].

## 2018-06-02 NOTE — Progress Notes (Signed)
Symptom Management Consult note [facility]  Telephone:([phone] Fax:([phone]  [name], [name], MD (Internal Medicine) [name], [name], RN as Registered Nurse [name], MD as Consulting Physician (Internal Medicine)   Name of the patient: Kathleen James  [id number]  February 23, 1951   Date of visit: 06/02/2018  Diagnosis: B-RAF Metastatic    Chief Complaint: dehydration/hypertension  Current Treatment: Phase 1 clinical trial [facility].  ERK + Abemaciclib  Oncology History: Patient was last seen and evaluated by primary medical oncologist Dr.Brahmanday on on 02/15/2018 for follow-up of metastatic neuroendocrine adenocarcinoma of a sending colon.  Recently been evaluated by MD Ouida Sills for phase 1 clinical trial.  Opted for clinical trial that was closer to home; [place] in [place].  She was evaluated by her clinical trial MD, Dr. Mliss Fritz for the past several weeks d/t increasing creatinine.  Both oral chemotherapies were held until labs normalized.  Creatinine as high as 2.71 on last lab draw.  Scheduled to return to reinitiate oral chemotherapy is on 06/02/2018 but unfortunately creatinine not recovering quickly and appointment was canceled.  Continues to require IV fluids with current treatment for dehydration d/t diarrhea and/or current oral chemotherapy.  Has received IV fluids on 05/08/2018 05/15/2018, 05/17/2017 and 05/29/2018.  Baseline creatinine is 1.1-1.2.  Imaging from 730 3:19 cycles of Lonsurf unfortunately revealed progression of disease.  They discontinued Lonsurf at that time.  Lab Results  Component Value Date   CREATININE 1.58 (H) 06/02/2018   CREATININE 1.71 (H) 05/29/2018   CREATININE 2.29 (H) 05/15/2018    Oncology History   # MARCH/APRIL  2017-NEUROENDOCRINE CA STAGE IV; [name]; [name]; [name]; [name]; [name]; [name]; [name]; July 14th  2017 CT- PR  # AUG 28th- FOLFIRI +Avastin; OCT 5th CT scan- PR.   # MARCH 20th- CT STABLE liver lesions; ? Enlarging cecal lesion [CEA rising]; March 26th- FOLFOX [last avastin march 12th 2018];   # resection of primary tumor [Dr.Byrnett]; Y 90- June 19th 2018.  # July 11th-FOLFOX + Avastin; SEP 2018 [CT- stable liver lesions; increasing ~38m RP LN]; RISING CEA [stopped end of Sep 2018 ]  # OCT 15th 2018- Vemu+ Iri+ Pan [mid nov stopped iri- neutropenia/dia+vemu- arthralgias]; 05/11/2017-Started enco+Bini+vectibex; April 28th PET- PROGRESSION  # May 2019- Lonsurf; End of July 2019- PET Progression; Stopped Lonserf  # acute lower GIB-EGD/ colonoscopy [Dr.Anna]- - ulcerated Lesion along the anastomotic line;  biopsy negative for recurrence.    # FOUNDATION ONE- B-RAF V600E- MUTATED [May 25883] # right kidney lesion 2.6 x 1.6 cm ? Angiolipoma [followed by Urology in past]  ------------------------------------------------------------    DIAGNOSIS: [Jan 2017 ]-right-sided colon cancer [neuroendocrine/adeno CA; B-RAF mutated]  STAGE:  IV  ;GOALS: PALLIATIVE  CURRENT/MOST RECENT THERAPY [may 15th 2019 ]- lonsurf      Malignant neoplasm of ascending colon ([place]    Subjective Data:  ECOG: 1 - Symptomatic but completely ambulatory  Subjective:     CDEZZIE BADILLAis a 67y.o. female who presents for evaluation of dehydration and elevated creatinine.  It has been present for 2 months.  Associated signs &  symptoms:  diarrhea occurring 6 times daily, lack of appetite and early satiety.  Last received IV fluids on 05/29/2018 for elevated creatinine.   Scheduled to return to [place] on 06/02/2018 for reinitiation of oral chemotherapies.  Had labs checked yesterday revealing persistent elevated creatinine.  Trip was canceled and they recommended she return to clinic for additional IV fluids today with lab work.   The following portions of the patient's history were reviewed and updated as appropriate: allergies, current medications, past family history, past medical history, past social history, past surgical history and problem list. Review of Systems A comprehensive review of systems was negative except for: Constitutional: positive for fatigue Respiratory: positive for dyspnea on exertion Gastrointestinal: positive for diarrhea and nausea Musculoskeletal: positive for muscle weakness       Objective:    There were no vitals taken for this visit. General appearance: alert, fatigued and no distress Nose: Nares normal. Septum midline. Mucosa normal. No drainage or sinus tenderness., no discharge Throat: lips, mucosa, and tongue normal; teeth and gums normal Neck: no adenopathy, no carotid bruit, no JVD, supple, symmetrical, trachea midline and thyroid not enlarged, symmetric, no tenderness/mass/nodules Lungs: clear to auscultation bilaterally Abdomen: soft, non-tender; bowel sounds normal; no masses,  no organomegaly Pulses: 2+ and symmetric Skin: Skin color, texture, turgor normal. No rashes or lesions Neurologic: Grossly normal    Assessment:     Elevated creatinine d/t dehydration secondary to diarrhea and ERK and Abemaciclib  Plan:    Antiemetics per medication orders. IV fluids per orders. PRN antiemetic per medication orders.    Metastatic BRAF mutated neuroendocrine/adenocarcinoma cancer of the colon: S/p 3 cycles of Lonsurf.  Imaging from July 2019 unfortunately revealed progression.  Discontinued Lonsurf.  Began phase 1 clinical trial with ERK + Abemaciclib in September 2019 under management of Dr. Mliss Fritz.   Was last seen for lab work about 2 weeks ago and found to have worsening creatinine.  Oral chemotherapies were stopped.  Plan was to return to [place] home on 06/02/2018 for further assessment including labs with the hope to reinitiate oral chemotherapy.  Unfortunately creatinine has not recovered as quickly as hoped.  Appointment rescheduled for next Thursday.  Acute kidney injury: Worsening kidney function.  Labs today show continued improvement of creatinine since receiving fluids and has discontinued oral chemotherapies.  She will have her labs rechecked prior to her trip to [place] next week.  Dehydration: Secondary to diarrhea from chemotherapy.  Encouraged to stay hydrated.  Use antidiarrheals as prescribed.  Hypertension: Blood pressure elevated today.  Takes metoprolol 25 mg daily.  Unclear etiology but likely due to discontinuing him oral chemotherapies and improved hydration.  Encouraged her to keep blood pressure diary.  Consulted with Dr. Rogue Bussing and it was recommended for her to begin Norvasc 10 mg daily.  If blood pressure drops below 034 systolically, patient is to hold Norvasc.  Patient in agreement with plan.  Will follow up with her on Monday with regards to blood pressure readings.  Plan: 1 L NaCl. Labs.  Slow improvement of creatinine.  1.58 today.  AST elevated but stable.  GFR improved.  Rx amlodipine 10 mg daily.  Blood pressure diary. Has CT scans scheduled for 06/23/2018. Scheduled to return to [place] on 06/08/2018 in hopes to reinitiate chemotherapy.  Greater than 50% was spent in counseling and coordination of care with this patient including but not limited to discussion of the relevant topics above (See A&P) including, but not limited  to diagnosis and management of acute and chronic medical conditions.   Faythe Casa, NP 06/05/2018 2:53 PM

## 2018-06-06 ENCOUNTER — Telehealth: Payer: Self-pay | Admitting: *Deleted

## 2018-06-06 NOTE — Telephone Encounter (Signed)
Per Rulon Abide, NP's request, called patient to see how she is doing after starting the blood pressure medicine last week. Patient stated that she is doing well and her blood pressure readings have improved. Patient stated that she is currently in [place] for her clinical trial visit and thanked [place] for checking on her.      dhs

## 2018-06-07 DIAGNOSIS — Z006 Encounter for examination for normal comparison and control in clinical research program: Secondary | ICD-10-CM | POA: Diagnosis not present

## 2018-06-07 DIAGNOSIS — C189 Malignant neoplasm of colon, unspecified: Secondary | ICD-10-CM | POA: Diagnosis not present

## 2018-06-12 ENCOUNTER — Other Ambulatory Visit: Payer: Self-pay | Admitting: *Deleted

## 2018-06-12 DIAGNOSIS — C182 Malignant neoplasm of ascending colon: Secondary | ICD-10-CM

## 2018-06-13 ENCOUNTER — Telehealth: Payer: Self-pay | Admitting: Internal Medicine

## 2018-06-13 ENCOUNTER — Inpatient Hospital Stay: Payer: Medicare HMO

## 2018-06-13 DIAGNOSIS — C787 Secondary malignant neoplasm of liver and intrahepatic bile duct: Secondary | ICD-10-CM | POA: Diagnosis not present

## 2018-06-13 DIAGNOSIS — C182 Malignant neoplasm of ascending colon: Secondary | ICD-10-CM | POA: Diagnosis not present

## 2018-06-13 DIAGNOSIS — R197 Diarrhea, unspecified: Secondary | ICD-10-CM | POA: Diagnosis not present

## 2018-06-13 DIAGNOSIS — N179 Acute kidney failure, unspecified: Secondary | ICD-10-CM | POA: Diagnosis not present

## 2018-06-13 DIAGNOSIS — E860 Dehydration: Secondary | ICD-10-CM | POA: Diagnosis not present

## 2018-06-13 LAB — COMPREHENSIVE METABOLIC PANEL
ALT: 31 U/L (ref 0–44)
AST: 45 U/L — ABNORMAL HIGH (ref 15–41)
Albumin: 2.8 g/dL — ABNORMAL LOW (ref 3.5–5.0)
Alkaline Phosphatase: 179 U/L — ABNORMAL HIGH (ref 38–126)
Anion gap: 9 (ref 5–15)
BUN: 19 mg/dL (ref 8–23)
CO2: 21 mmol/L — ABNORMAL LOW (ref 22–32)
Calcium: 9.4 mg/dL (ref 8.9–10.3)
Chloride: 106 mmol/L (ref 98–111)
Creatinine, Ser: 1.99 mg/dL — ABNORMAL HIGH (ref 0.44–1.00)
GFR, EST AFRICAN AMERICAN: 29 mL/min — AB (ref >60)
GFR, EST NON AFRICAN AMERICAN: 25 mL/min — AB (ref >60)
Glucose, Bld: 115 mg/dL — ABNORMAL HIGH (ref 70–99)
Potassium: 4.8 mmol/L (ref 3.5–5.1)
Sodium: 136 mmol/L (ref 135–145)
Total Bilirubin: 0.6 mg/dL (ref 0.3–1.2)
Total Protein: 7.1 g/dL (ref 6.5–8.1)

## 2018-06-13 NOTE — Telephone Encounter (Signed)
Heather/brooke- Please inform patient that creatinine is 1.99; slightly higher from last visit [1.7]; but overall stable.  Continue increased fluid intake.

## 2018-06-13 NOTE — Telephone Encounter (Signed)
Labs faxed to [place].

## 2018-06-13 NOTE — Telephone Encounter (Signed)
Patient contacted with test results 

## 2018-06-23 DIAGNOSIS — E860 Dehydration: Secondary | ICD-10-CM | POA: Diagnosis not present

## 2018-06-23 DIAGNOSIS — Z006 Encounter for examination for normal comparison and control in clinical research program: Secondary | ICD-10-CM | POA: Diagnosis not present

## 2018-06-23 DIAGNOSIS — C189 Malignant neoplasm of colon, unspecified: Secondary | ICD-10-CM | POA: Diagnosis not present

## 2018-06-23 DIAGNOSIS — R82998 Other abnormal findings in urine: Secondary | ICD-10-CM | POA: Diagnosis not present

## 2018-06-25 ENCOUNTER — Other Ambulatory Visit: Payer: Self-pay | Admitting: Oncology

## 2018-06-26 ENCOUNTER — Telehealth: Payer: Self-pay | Admitting: Internal Medicine

## 2018-06-26 DIAGNOSIS — C182 Malignant neoplasm of ascending colon: Secondary | ICD-10-CM

## 2018-06-26 NOTE — Telephone Encounter (Signed)
Returned a phone call at [phone] [Dr. from [facility]]; left voicemail to call me back.  Colette-please schedule for 830/on January 9; please order CBC CMP CEA-labs.

## 2018-06-26 NOTE — Addendum Note (Signed)
Addended by: Sabino Gasser on: 06/26/2018 02:17 PM   Modules accepted: Orders

## 2018-06-27 ENCOUNTER — Telehealth: Payer: Self-pay | Admitting: *Deleted

## 2018-06-27 NOTE — Telephone Encounter (Signed)
Voicemail received.   "Ferdie Ping at [facility] in [place].  We understand you are her PCP and primary Oncologist.  We see [name] for clinical trial here.  She has had positive urine culture results with Klebsiella back in December and also still currently and she also has had elevated creatinine so she might need some fluids locally.  If we could just get you you guys to F/U with her and anything that you need to do just let us know and we'll go from there.  Our phone number is [phone].  We can fax you her UA/CS results as well if you give [place] your fax number.  Thank you so much."    Provided [place] at [facility] Phone: [phone] and Fax : [phone] with return call.

## 2018-06-29 ENCOUNTER — Encounter: Payer: Self-pay | Admitting: Internal Medicine

## 2018-06-29 ENCOUNTER — Inpatient Hospital Stay: Payer: Medicare HMO

## 2018-06-29 ENCOUNTER — Other Ambulatory Visit: Payer: Self-pay

## 2018-06-29 ENCOUNTER — Inpatient Hospital Stay: Payer: Medicare HMO | Attending: Internal Medicine

## 2018-06-29 ENCOUNTER — Inpatient Hospital Stay (HOSPITAL_BASED_OUTPATIENT_CLINIC_OR_DEPARTMENT_OTHER): Payer: Medicare HMO | Admitting: Internal Medicine

## 2018-06-29 VITALS — BP 101/74 | HR 111 | Temp 96.9°F | Resp 20 | Ht 64.0 in | Wt 186.0 lb

## 2018-06-29 DIAGNOSIS — N390 Urinary tract infection, site not specified: Secondary | ICD-10-CM | POA: Insufficient documentation

## 2018-06-29 DIAGNOSIS — C182 Malignant neoplasm of ascending colon: Secondary | ICD-10-CM

## 2018-06-29 DIAGNOSIS — R531 Weakness: Secondary | ICD-10-CM | POA: Diagnosis not present

## 2018-06-29 DIAGNOSIS — D649 Anemia, unspecified: Secondary | ICD-10-CM | POA: Diagnosis not present

## 2018-06-29 DIAGNOSIS — Z66 Do not resuscitate: Secondary | ICD-10-CM

## 2018-06-29 DIAGNOSIS — R42 Dizziness and giddiness: Secondary | ICD-10-CM

## 2018-06-29 DIAGNOSIS — R112 Nausea with vomiting, unspecified: Secondary | ICD-10-CM | POA: Diagnosis not present

## 2018-06-29 DIAGNOSIS — R197 Diarrhea, unspecified: Secondary | ICD-10-CM | POA: Diagnosis not present

## 2018-06-29 DIAGNOSIS — B961 Klebsiella pneumoniae [K. pneumoniae] as the cause of diseases classified elsewhere: Secondary | ICD-10-CM

## 2018-06-29 DIAGNOSIS — Z79899 Other long term (current) drug therapy: Secondary | ICD-10-CM | POA: Diagnosis not present

## 2018-06-29 DIAGNOSIS — R7989 Other specified abnormal findings of blood chemistry: Secondary | ICD-10-CM

## 2018-06-29 DIAGNOSIS — C787 Secondary malignant neoplasm of liver and intrahepatic bile duct: Secondary | ICD-10-CM

## 2018-06-29 DIAGNOSIS — N183 Chronic kidney disease, stage 3 (moderate): Secondary | ICD-10-CM | POA: Insufficient documentation

## 2018-06-29 DIAGNOSIS — E860 Dehydration: Secondary | ICD-10-CM

## 2018-06-29 LAB — COMPREHENSIVE METABOLIC PANEL
ALT: 23 U/L (ref 0–44)
AST: 39 U/L (ref 15–41)
Albumin: 2.5 g/dL — ABNORMAL LOW (ref 3.5–5.0)
Alkaline Phosphatase: 242 U/L — ABNORMAL HIGH (ref 38–126)
Anion gap: 13 (ref 5–15)
BUN: 29 mg/dL — ABNORMAL HIGH (ref 8–23)
CO2: 17 mmol/L — ABNORMAL LOW (ref 22–32)
Calcium: 9.2 mg/dL (ref 8.9–10.3)
Chloride: 107 mmol/L (ref 98–111)
Creatinine, Ser: 2.12 mg/dL — ABNORMAL HIGH (ref 0.44–1.00)
GFR calc Af Amer: 27 mL/min — ABNORMAL LOW (ref >60)
GFR, EST NON AFRICAN AMERICAN: 23 mL/min — AB (ref >60)
Glucose, Bld: 124 mg/dL — ABNORMAL HIGH (ref 70–99)
Potassium: 4.1 mmol/L (ref 3.5–5.1)
Sodium: 137 mmol/L (ref 135–145)
Total Bilirubin: 0.9 mg/dL (ref 0.3–1.2)
Total Protein: 7.1 g/dL (ref 6.5–8.1)

## 2018-06-29 LAB — CBC WITH DIFFERENTIAL/PLATELET
Abs Immature Granulocytes: 0.14 10*3/uL — ABNORMAL HIGH (ref 0.00–0.07)
Basophils Absolute: 0.1 10*3/uL (ref 0.0–0.1)
Basophils Relative: 1 %
Eosinophils Absolute: 0.1 10*3/uL (ref 0.0–0.5)
Eosinophils Relative: 1 %
HEMATOCRIT: 30.2 % — AB (ref 36.0–46.0)
Hemoglobin: 10 g/dL — ABNORMAL LOW (ref 12.0–15.0)
Immature Granulocytes: 2 %
Lymphocytes Relative: 4 %
Lymphs Abs: 0.3 10*3/uL — ABNORMAL LOW (ref 0.7–4.0)
MCH: 34 pg (ref 26.0–34.0)
MCHC: 33.1 g/dL (ref 30.0–36.0)
MCV: 102.7 fL — ABNORMAL HIGH (ref 80.0–100.0)
MONO ABS: 0.4 10*3/uL (ref 0.1–1.0)
Monocytes Relative: 4 %
Neutro Abs: 8.5 10*3/uL — ABNORMAL HIGH (ref 1.7–7.7)
Neutrophils Relative %: 88 %
Platelets: 280 10*3/uL (ref 150–400)
RBC: 2.94 MIL/uL — AB (ref 3.87–5.11)
WBC: 9.5 10*3/uL (ref 4.0–10.5)
nRBC: 0 % (ref 0.0–0.2)

## 2018-06-29 MED ORDER — DEXAMETHASONE SODIUM PHOSPHATE 10 MG/ML IJ SOLN
10.0000 mg | Freq: Once | INTRAMUSCULAR | Status: AC
  Administered 2018-06-29: 10 mg via INTRAVENOUS
  Filled 2018-06-29: qty 1

## 2018-06-29 MED ORDER — LEVOFLOXACIN 250 MG PO TABS: ORAL_TABLET | ORAL | 0 refills | Status: AC

## 2018-06-29 MED ORDER — SODIUM CHLORIDE 0.9% FLUSH
10.0000 mL | Freq: Once | INTRAVENOUS | Status: AC
  Administered 2018-06-29: 10 mL via INTRAVENOUS
  Filled 2018-06-29: qty 10

## 2018-06-29 MED ORDER — HEPARIN SOD (PORK) LOCK FLUSH 100 UNIT/ML IV SOLN
500.0000 [IU] | Freq: Once | INTRAVENOUS | Status: AC
  Administered 2018-06-29: 500 [IU]
  Filled 2018-06-29: qty 5

## 2018-06-29 MED ORDER — SODIUM CHLORIDE 0.9 % IV SOLN
INTRAVENOUS | Status: DC
  Administered 2018-06-29: 10:00:00 via INTRAVENOUS
  Filled 2018-06-29 (×2): qty 250

## 2018-06-29 NOTE — Assessment & Plan Note (Addendum)
#  Metastatic B-RAF mutated neuroendocrine/ adeno ca of the colon.  Most recently progressed through a phase 1 clinical trial from [facility].  Imaging done at [facility].  #Unfortunately the treatment options are limited-with not good response/high risk of side effects.  Discussed option of using regorafenib; or going back to FOLFIRI chemotherapy.   #Reviewed the potential side effects including but not limited to fatigue hand-foot syndrome diarrhea the chemotherapy options.   #UTI-Klebsiella culture sensitive to levofloxacin.  Recommend renally dosed levofloxacin for total of 10 days.  # Anemia multifactorial -underlying malignancy /CKD.  Hemoglobin 9-10. STABLE.   #CKD stage III 4 creatinine is 2.2-recommend IV fluids.  #Recent nausea vomiting diarrhea-question later to recent abema/versus progressive disease/others.  Recommend fluids dexamethasone.  And reevaluate in 2 weeks if progressively improving-then reevaluate/treatment options-as discussed above.  However if patient does not improve over the next few weeks/continues to decline-hospice care might be recommended.  #DNR/DNI-previously discussed.  Patient does not want any aggressive interventions.  However recommend evaluation with palliative care/at next visit.  # IVFs+dex today x1 hour # VFs+dex next Friday x1 hour; palliative care appt-Josh # follow up in 2 weeks /fridays- MD/labs- cbc/cmp/ IVFs+dex-Dr.B   Also discussed with Dr.Meredith Mliss Fritz from [facility].  # 40 minutes face-to-face with the patient discussing the above plan of care; more than 50% of time spent on prognosis/ natural history; counseling and coordination.

## 2018-06-29 NOTE — Progress Notes (Signed)
[facility] to obtain u/a C&S results. Patient had urine culture collected on 1/3 and has not received any results from oncologist. Results rcvd and reviewed with Dr. Rogue Bussing. Patient currently is not covered with any antibiotics. Urine Positive for klebsiella pneumoniae. Dr. B will send rx for new antibotic. Daughter, Junie Panning made aware.

## 2018-06-29 NOTE — Progress Notes (Signed)
[place] OFFICE PROGRESS NOTE  [name], [name], MD (Internal Medicine) [name], [name], RN as Registered Nurse [name], MD as Consulting Physician (Internal Medicine)  Cancer Staging No matching staging information was found for the patient.   Oncology History   # MARCH/APRIL 2017-NEUROENDOCRINE CA STAGE IV; [name]; [name]; [name]; [name]; [name]; [name]; [name]; July 14th  2017 CT- PR  # AUG 28th 2017- FOLFIRI +Avastin; OCT 5th CT scan- PR.   # MARCH 20th- CT STABLE liver lesions; ? Enlarging cecal lesion [CEA rising]; March 26th- FOLFOX [last avastin march 12th 2018];   # resection of primary tumor [Dr.Byrnett]; Y 89- June 19th 2018.  # July 11th 2018-FOLFOX + Avastin; SEP 2018 [CT- stable liver lesions; increasing ~83mm RP LN]; RISING CEA [stopped end of Sep 2018 ]  # OCT 15th 2018- Vemu+ Iri+ Pan [mid nov stopped iri- neutropenia/dia+vemu- arthralgias]; 05/11/2017-Started enco+Bini+vectibex; April 28th PET- PROGRESSION  # May 2019- Lonsurf; End of July 2019- PET Progression; Stopped Ernestine Conrad;   #September 2019-January 2020-phase 1 clinical trial/Sarah Cannon/Dr.Mckean; Jan 2020 progression of disease.  #   # acute lower GIB-EGD/ colonoscopy [Dr.Anna]- - ulcerated Lesion along the anastomotic line;  biopsy negative for recurrence.    # [facility]- B-RAF V600E- MUTATED [May 2706]  # right kidney lesion 2.6 x 1.6 cm ? Angiolipoma [followed by Urology in past]  ------------------------------------------------------------    DIAGNOSIS: [Jan 2017 ]-right-sided colon cancer  [neuroendocrine/adeno CA; B-RAF mutated]  STAGE:  IV  ;GOALS: PALLIATIVE  CURRENT/MOST RECENT THERAPY chemotherapy break.      Malignant neoplasm of ascending colon ([place])      INTERVAL HISTORY:  Kathleen James 68 y.o.  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  Patient has been taking of clinical trial phase 1-Sarah [facility] because of progressive disease in January 2020.  Patient is currently back home.  She complains of vertigo bilateral ear discomfort.  Feels weak.  Poor appetite.  Weight loss.  4 days ago had episode of nausea vomiting diarrhea.  Currently improved.  Overall feels poorly.  Review of Systems  Constitutional: Positive for malaise/fatigue. Negative for chills, diaphoresis, fever and weight loss.  HENT: Negative for nosebleeds and sore throat.   Eyes: Negative for double vision.  Respiratory: Negative for cough, hemoptysis, sputum production, shortness of breath and wheezing.   Cardiovascular: Negative for chest pain, palpitations, orthopnea and leg swelling.  Gastrointestinal: Positive for diarrhea and nausea. Negative for blood in stool, constipation, heartburn, melena and vomiting.  Genitourinary: Negative for dysuria, frequency and urgency.  Musculoskeletal: Negative for back pain and joint pain.  Skin: Negative.  Negative for itching and rash.  Neurological: Negative for dizziness, tingling, focal weakness, weakness and headaches.  Endo/Heme/Allergies: Does not bruise/bleed easily.  Psychiatric/Behavioral: Negative for depression. The patient is not nervous/anxious and does not have insomnia.       PAST MEDICAL HISTORY :  Past Medical History:  Diagnosis Date  . Angiolipoma of kidney    followed with serial CTs ,,  Dr. Jacqlyn Larsen  . Anxiety   . Arthritis   . 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  Chemotherapy induced diarrhea   . Chemotherapy induced nausea and vomiting   . Hammer toe   . Hypertension   . Liver cancer ([place])   . Neuro-endocrine carcinoma ([place])   . Obesity (BMI 30-39.9)     PAST SURGICAL HISTORY :   Past Surgical History:  Procedure Laterality Date  . ABDOMINAL HYSTERECTOMY  1995   endometriosis, heavy bleeding  . BUNIONECTOMY Bilateral   . CESAREAN SECTION  1985  . COLONOSCOPY WITH PROPOFOL N/A 10/09/2015   Procedure: COLONOSCOPY WITH PROPOFOL;  Surgeon: Robert Bellow, MD;  Location[facility] ENDOSCOPY;  Service: Endoscopy;  Laterality: N/A;  . COLONOSCOPY WITH PROPOFOL N/A 09/07/2017   Procedure: COLONOSCOPY WITH PROPOFOL;  Surgeon: Jonathon Bellows, MD;  Location[facility] ENDOSCOPY;  Service: Gastroenterology;  Laterality: N/A;  . ESOPHAGOGASTRODUODENOSCOPY (EGD) WITH PROPOFOL N/A 09/07/2017   Procedure: ESOPHAGOGASTRODUODENOSCOPY (EGD) WITH PROPOFOL;  Surgeon: Jonathon Bellows, MD;  Location[facility] ENDOSCOPY;  Service: Gastroenterology;  Laterality: N/A;  . IR ANGIOGRAM SELECTIVE EACH ADDITIONAL VESSEL  11/22/2016  . IR ANGIOGRAM SELECTIVE EACH ADDITIONAL VESSEL  11/22/2016  . IR ANGIOGRAM SELECTIVE EACH ADDITIONAL VESSEL  11/22/2016  . IR ANGIOGRAM SELECTIVE EACH ADDITIONAL VESSEL  12/07/2016  . IR ANGIOGRAM SELECTIVE EACH ADDITIONAL VESSEL  12/07/2016  . IR ANGIOGRAM VISCERAL SELECTIVE  11/22/2016  . IR ANGIOGRAM VISCERAL SELECTIVE  12/07/2016  . IR EMBO ARTERIAL NOT HEMORR HEMANG INC GUIDE ROADMAPPING  11/22/2016  . IR EMBO TUMOR ORGAN ISCHEMIA INFARCT INC GUIDE ROADMAPPING  12/07/2016  . IR GENERIC HISTORICAL  04/07/2016   IR RADIOLOGIST EVAL & MGMT 04/07/2016 Arne Cleveland, MD [facility] INTERV RAD  . IR RADIOLOGIST EVAL & MGMT  04/14/2017  . IR US GUIDE VASC ACCESS RIGHT  11/22/2016  . IR US GUIDE VASC ACCESS RIGHT  12/07/2016  . LAPAROSCOPIC RIGHT COLECTOMY Right 10/26/2016   Procedure: LAPAROSCOPIC RIGHT COLECTOMY;  Surgeon: Robert Bellow, MD;  Location: [facility] ORS;  Service:  General;  Laterality: Right;  . OOPHORECTOMY    . PORTACATH PLACEMENT Left 10/01/2015   Procedure: INSERTION PORT-A-CATH;  Surgeon: Robert Bellow, MD;  Location: [facility] ORS;  Service: General;  Laterality: Left;  . ROTATOR CUFF REPAIR Right 2008   right shoulder.  [place]     FAMILY HISTORY :   Family History  Problem Relation Age of Onset  . Mental illness Mother 21       bipolar, dementia  . Cancer Maternal Aunt 70       breast ca  . Hypertension Father   . Stroke Father 12       deceased  . COPD Father   . Heart disease Sister        deceased    SOCIAL HISTORY:   Social History   Tobacco Use  . Smoking status: Never Smoker  . Smokeless tobacco: Never Used  Substance Use Topics  . Alcohol use: Yes    Alcohol/week: 0.0 standard drinks    Comment: occasionally  . Drug use: No    ALLERGIES:  is allergic to bee venom; dilaudid [hydromorphone hcl]; and morphine and related.  MEDICATIONS:  Current Outpatient Medications  Medication Sig Dispense Refill  . ALPRAZolam (XANAX) 0.25 MG tablet TAKE 1 TABLET BY MOUTH TWICE DAILY AS NEEDED FOR ANXIETY 60 tablet 0  . amLODipine (NORVASC) 10 MG tablet TAKE 1 TABLET BY MOUTH EVERY DAY 30 tablet 0  . Biotin w/ Vitamins C &  E (HAIR/SKIN/NAILS PO) Take 1 tablet by mouth daily.    Marland Kitchen lidocaine-prilocaine (EMLA) cream Apply 1 application topically as needed. Apply to port a cath site 1 hour prior to chemotherapy treatments 30 g 6  . metoprolol tartrate (LOPRESSOR) 25 MG tablet TAKE ONE TABLET BY MOUTH TWICE DAILY 180 tablet 2  . potassium chloride (K-DUR) 10 MEQ tablet TAKE ONE TABLET BY MOUTH TWICE DAILY 180 tablet 1  . triamcinolone ointment (KENALOG) 0.5 % Apply 1 application topically 2 (two) times daily. To rash 30 g 0  . Chlorpheniramine Maleate (CHLOR-TABLETS PO) Take 1 tablet by mouth every evening.    . diphenoxylate-atropine (LOMOTIL) 2.5-0.025 MG tablet Take 1 tablet by mouth 4 (four) times daily as needed for diarrhea or loose  stools. (Patient not taking: Reported on 06/02/2018) 60 tablet 0  . Investigational - Study Medication [place], [place] clinical trial    . levofloxacin (LEVAQUIN) 250 MG tablet Take 2 pills on the first day; and then once pill a day 11 tablet 0  . loperamide (IMODIUM A-D) 2 MG tablet Take 2 mg by mouth 4 (four) times daily as needed for diarrhea or loose stools.    . ondansetron (ZOFRAN) 8 MG tablet Take 1 tablet (8 mg total) by mouth every 8 (eight) hours as needed for nausea or vomiting. (Patient not taking: Reported on 06/02/2018) 40 tablet 3  . prochlorperazine (COMPAZINE) 10 MG tablet TAKE ONE TABLET BY MOUTH EVERY 6 HOURS AS NEEDED FOR NAUSEA OR VOMITING (Patient not taking: Reported on 06/02/2018) 30 tablet 2  . traMADol (ULTRAM) 50 MG tablet Take 1 tablet by mouth every 6 (six) hours as needed for pain.  0   No current facility-administered medications for this visit.    Facility-Administered Medications Ordered in Other Visits  Medication Dose Route Frequency Provider Last Rate Last Dose  . 0.9 %  sodium chloride infusion   Intravenous Continuous [name], MD   Stopped at 06/29/18 1048  . heparin lock flush 100 unit/mL  500 Units Intravenous Once Charlaine Dalton R, MD      . sodium chloride flush (NS) 0.9 % injection 10 mL  10 mL Intravenous PRN [name], MD   10 mL at 11/24/15 0836    PHYSICAL EXAMINATION: ECOG PERFORMANCE STATUS: 0 - Asymptomatic  BP 101/74 (BP Location: Left Arm, Patient Position: Sitting)   Pulse (!) 111   Temp (!) 96.9 F (36.1 C) (Tympanic)   Resp 20   Ht 5\' 4"  (1.626 m)   Wt 186 lb (84.4 kg)   BMI 31.93 kg/m   Filed Weights   06/29/18 0917  Weight: 186 lb (84.4 kg)    Physical Exam  Constitutional: She is oriented to person, place, and time and well-developed, well-nourished, and in no distress.  She is accompanied by husband and daughter.  She is in a wheelchair.  HENT:  Head:  Normocephalic and atraumatic.  Mouth/Throat: Oropharynx is clear and moist. No oropharyngeal exudate.  Eyes: Pupils are equal, round, and reactive to light.  Neck: Normal range of motion. Neck supple.  Cardiovascular: Normal rate and regular rhythm.  Pulmonary/Chest: Breath sounds normal. No respiratory distress. She has no wheezes.  Abdominal: Soft. Bowel sounds are normal. She exhibits no distension and no mass. There is no abdominal tenderness. There is no rebound and no guarding.  Musculoskeletal: Normal range of motion.        General: No tenderness or edema.  Neurological: She is  alert and oriented to person, place, and time.  Skin: Skin is warm.  Psychiatric: Affect normal.       LABORATORY DATA:  I have reviewed the data as listed    Component Value Date/Time   NA 137 06/29/2018 0811   NA 138 11/04/2016 1009   K 4.1 06/29/2018 0811   CL 107 06/29/2018 0811   CO2 17 (L) 06/29/2018 0811   GLUCOSE 124 (H) 06/29/2018 0811   BUN 29 (H) 06/29/2018 0811   BUN 14 11/04/2016 1009   CREATININE 2.12 (H) 06/29/2018 0811   CALCIUM 9.2 06/29/2018 0811   PROT 7.1 06/29/2018 0811   PROT 7.9 11/04/2016 1009   ALBUMIN 2.5 (L) 06/29/2018 0811   ALBUMIN 4.2 11/04/2016 1009   AST 39 06/29/2018 0811   ALT 23 06/29/2018 0811   ALKPHOS 242 (H) 06/29/2018 0811   BILITOT 0.9 06/29/2018 0811   BILITOT 0.3 11/04/2016 1009   GFRNONAA 23 (L) 06/29/2018 0811   GFRAA 27 (L) 06/29/2018 0811    No results found for: SPEP, UPEP  Lab Results  Component Value Date   WBC 9.5 06/29/2018   NEUTROABS 8.5 (H) 06/29/2018   HGB 10.0 (L) 06/29/2018   HCT 30.2 (L) 06/29/2018   MCV 102.7 (H) 06/29/2018   PLT 280 06/29/2018      Chemistry      Component Value Date/Time   NA 137 06/29/2018 0811   NA 138 11/04/2016 1009   K 4.1 06/29/2018 0811   CL 107 06/29/2018 0811   CO2 17 (L) 06/29/2018 0811   BUN 29 (H) 06/29/2018 0811   BUN 14 11/04/2016 1009   CREATININE 2.12 (H) 06/29/2018 0811       Component Value Date/Time   CALCIUM 9.2 06/29/2018 0811   ALKPHOS 242 (H) 06/29/2018 0811   AST 39 06/29/2018 0811   ALT 23 06/29/2018 0811   BILITOT 0.9 06/29/2018 0811   BILITOT 0.3 11/04/2016 1009       RADIOGRAPHIC STUDIES: I have personally reviewed the radiological images as listed and agreed with the findings in the report. No results found.   ASSESSMENT & PLAN:  Malignant neoplasm of ascending colon ([place])  #Metastatic B-RAF mutated neuroendocrine/ adeno ca of the colon.  Most recently progressed through a phase 1 clinical trial [facility].  [facility]aging done at [facility].  #Unfortunately the treatment options are limited-with not good response/high risk of side effects.  Discussed option of using regorafenib; or going back to FOLFIRI chemotherapy.   #Reviewed the potential side effects including but not limited to fatigue hand-foot syndrome diarrhea the chemotherapy options.   #UTI-Klebsiella culture sensitive to levofloxacin.  Recommend renally dosed levofloxacin for total of 10 days.  # Anemia multifactorial -underlying malignancy /CKD.  Hemoglobin 9-10. STABLE.   #CKD stage III 4 creatinine is 2.2-recommend IV fluids.  #Recent nausea vomiting diarrhea-question later to recent abema/versus progressive disease/others.  Recommend fluids dexamethasone.  And reevaluate in 2 weeks if progressively improving-then reevaluate/treatment options-as discussed above.  However if patient does not improve over the next few weeks/continues to decline-hospice care might be recommended.  #DNR/DNI-previously discussed.  Patient does not want any aggressive interventions.  However recommend evaluation with palliative care/at next visit.  # IVFs+dex today x1 hour # VFs+dex next Friday x1 hour; palliative care appt-Josh # follow up in 2 weeks /fridays- MD/labs- cbc/cmp/ IVFs+dex-Dr.B   Also discussed with Dr.Meredith Mliss Fritz from [facility].  # 40  minutes face-to-face with the patient  discussing the above plan of care; more than 50% of time spent on prognosis/ natural history; counseling and coordination.    Orders Placed This Encounter  Procedures  . Urine culture    Standing Status:   Future    Standing Expiration Date:   06/30/2019  . Urinalysis, Complete w Microscopic    Standing Status:   Future    Standing Expiration Date:   06/30/2019  . Comprehensive metabolic panel    Standing Status:   Future    Standing Expiration Date:   06/30/2019  . CBC with Differential    Standing Status:   Future    Standing Expiration Date:   06/30/2019   All questions were answered. The patient knows to call the clinic with any problems, questions or concerns.      [name], MD 06/29/2018 3:46 PM

## 2018-06-30 ENCOUNTER — Other Ambulatory Visit: Payer: Self-pay | Admitting: Oncology

## 2018-06-30 ENCOUNTER — Encounter: Payer: Self-pay | Admitting: Internal Medicine

## 2018-06-30 ENCOUNTER — Telehealth: Payer: Self-pay | Admitting: *Deleted

## 2018-06-30 LAB — CEA: CEA: 21603 ng/mL — ABNORMAL HIGH (ref 0.0–4.7)

## 2018-06-30 MED ORDER — OXYBUTYNIN CHLORIDE ER 5 MG PO TB24: 5.0000 mg | ORAL_TABLET | Freq: Every day | ORAL | 0 refills | Status: AC

## 2018-06-30 MED ORDER — BELLADONNA ALKALOIDS-OPIUM 16.2-30 MG RE SUPP: 30.0000 mg | Freq: Three times a day (TID) | RECTAL | 0 refills | Status: DC | PRN

## 2018-06-30 NOTE — Progress Notes (Signed)
B & O suppositories ordered.   Pyridium contraindicated for patients with kidney disease.  Labs from 06/29/2018 reveal a creatinine of 2.12 and BUN of 29.  Faythe Casa, NP 06/30/2018 4:01 PM

## 2018-06-30 NOTE — Telephone Encounter (Signed)
[facility] [place] [facility] B & [facility] supp. Patient insists she must have something. It will be Monday before an order could be gotten from warehouse. Call to Lorretta Harp, NP who spoke with [facility] Pharmacist and ordered Ditropan 5 mg daily # 30 0 refill

## 2018-06-30 NOTE — Telephone Encounter (Addendum)
Patient contacted office via pt portal to c/o of "worsening UTI symptoms." I contacted patient per Dr. B to offer her an apt with [facility] clinic, which patient declined. Pt reports 'burning with urination/bladder spasms.' spoke with Dr. Rogue Bussing- He will not prescribe pyridium or ADO otc medications due to kidney function. Sonia Baller, NP to send belladona suppositories to pt's pharmacy. Patient made aware.

## 2018-07-05 ENCOUNTER — Other Ambulatory Visit: Payer: Self-pay | Admitting: *Deleted

## 2018-07-05 MED ORDER — PREDNISONE 20 MG PO TABS: 20.0000 mg | ORAL_TABLET | Freq: Every day | ORAL | 0 refills | Status: AC

## 2018-07-06 ENCOUNTER — Other Ambulatory Visit: Payer: Self-pay | Admitting: *Deleted

## 2018-07-07 ENCOUNTER — Inpatient Hospital Stay: Payer: Medicare HMO

## 2018-07-07 ENCOUNTER — Inpatient Hospital Stay (HOSPITAL_BASED_OUTPATIENT_CLINIC_OR_DEPARTMENT_OTHER): Payer: Medicare HMO | Admitting: Hospice and Palliative Medicine

## 2018-07-07 VITALS — BP 108/73 | HR 72 | Temp 98.2°F | Resp 20

## 2018-07-07 DIAGNOSIS — R197 Diarrhea, unspecified: Secondary | ICD-10-CM | POA: Diagnosis not present

## 2018-07-07 DIAGNOSIS — Z66 Do not resuscitate: Secondary | ICD-10-CM

## 2018-07-07 DIAGNOSIS — N390 Urinary tract infection, site not specified: Secondary | ICD-10-CM | POA: Diagnosis not present

## 2018-07-07 DIAGNOSIS — Z515 Encounter for palliative care: Secondary | ICD-10-CM

## 2018-07-07 DIAGNOSIS — R531 Weakness: Secondary | ICD-10-CM

## 2018-07-07 DIAGNOSIS — B961 Klebsiella pneumoniae [K. pneumoniae] as the cause of diseases classified elsewhere: Secondary | ICD-10-CM | POA: Diagnosis not present

## 2018-07-07 DIAGNOSIS — D649 Anemia, unspecified: Secondary | ICD-10-CM

## 2018-07-07 DIAGNOSIS — R112 Nausea with vomiting, unspecified: Secondary | ICD-10-CM

## 2018-07-07 DIAGNOSIS — C787 Secondary malignant neoplasm of liver and intrahepatic bile duct: Secondary | ICD-10-CM | POA: Diagnosis not present

## 2018-07-07 DIAGNOSIS — R42 Dizziness and giddiness: Secondary | ICD-10-CM | POA: Diagnosis not present

## 2018-07-07 DIAGNOSIS — C182 Malignant neoplasm of ascending colon: Secondary | ICD-10-CM | POA: Diagnosis not present

## 2018-07-07 DIAGNOSIS — N183 Chronic kidney disease, stage 3 (moderate): Secondary | ICD-10-CM

## 2018-07-07 DIAGNOSIS — Z7189 Other specified counseling: Secondary | ICD-10-CM

## 2018-07-07 DIAGNOSIS — Z79899 Other long term (current) drug therapy: Secondary | ICD-10-CM

## 2018-07-07 MED ORDER — HEPARIN SOD (PORK) LOCK FLUSH 100 UNIT/ML IV SOLN
500.0000 [IU] | Freq: Once | INTRAVENOUS | Status: AC | PRN
  Administered 2018-07-07: 500 [IU]

## 2018-07-07 MED ORDER — DEXAMETHASONE SODIUM PHOSPHATE 10 MG/ML IJ SOLN
10.0000 mg | Freq: Once | INTRAMUSCULAR | Status: AC
  Administered 2018-07-07: 10 mg via INTRAVENOUS

## 2018-07-07 MED ORDER — DEXAMETHASONE SODIUM PHOSPHATE 10 MG/ML IJ SOLN: 10.0000 mg | Freq: Once | INTRAMUSCULAR | Status: DC

## 2018-07-07 MED ORDER — SODIUM CHLORIDE 0.9 % IV SOLN
INTRAVENOUS | Status: DC
  Administered 2018-07-07: 13:00:00 via INTRAVENOUS
  Filled 2018-07-07 (×2): qty 250

## 2018-07-07 MED ORDER — DEXAMETHASONE SODIUM PHOSPHATE 10 MG/ML IJ SOLN
INTRAMUSCULAR | Status: AC
  Filled 2018-07-07: qty 1

## 2018-07-07 MED ORDER — MIRTAZAPINE 15 MG PO TABS: 7.5000 mg | ORAL_TABLET | Freq: Every day | ORAL | 1 refills | Status: AC

## 2018-07-07 NOTE — Progress Notes (Signed)
[place]  [phone] [phone]   Name: Kathleen James Date: 07/07/2018 MRN: [id number]  DOB: 1950/11/23  [name], [name], MD (Internal Medicine) Bary Castilla, Forest Gleason, [name], RN as Registered Nurse [name], MD as Consulting Physician (Internal Medicine)    REASON FOR CONSULTATION: Palliative Care consult requested for this 68 y.o. 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  Patient underwent resection of the primary mass and has had multiple courses of chemotherapy with evidence of disease progression.  Most recently patient completed a phase 1 clinical trial at [facility] in January 2020 and has also had rising tumor markers despite treatment.  PMH also notable for history of GI bleed, right kidney mass, anxiety, hypertension, and OA.  Patient was referred to palliative care to discuss goals.   SOCIAL HISTORY:    Patient is married and lives at home with her husband.  She has a son and daughter, both of whom are involved in her care.  ADVANCE DIRECTIVES:  Patient has a living will.  Her daughter is her healthcare power of attorney.  CODE STATUS: DNR  PAST MEDICAL HISTORY: Past Medical History:  Diagnosis Date  . Angiolipoma of kidney    followed with serial CTs ,,  Dr. Jacqlyn Larsen  . Anxiety   . Arthritis   . 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 Chemotherapy induced diarrhea   . Chemotherapy induced nausea and vomiting   . Hammer toe   . Hypertension   . Liver cancer ([place])   . Neuro-endocrine carcinoma ([place])   . Obesity (BMI 30-39.9)     PAST SURGICAL HISTORY:  Past Surgical History:  Procedure  Laterality Date  . ABDOMINAL HYSTERECTOMY  1995   endometriosis, heavy bleeding  . BUNIONECTOMY Bilateral   . CESAREAN SECTION  1985  . COLONOSCOPY WITH PROPOFOL N/A 10/09/2015   Procedure: COLONOSCOPY WITH PROPOFOL;  Surgeon: Robert Bellow, MD;  Location: [facility] ENDOSCOPY;  Service: Endoscopy;  Laterality: N/A;  . COLONOSCOPY WITH PROPOFOL N/A 09/07/2017   Procedure: COLONOSCOPY WITH PROPOFOL;  Surgeon: Jonathon Bellows, MD;  Location: [facility] ENDOSCOPY;  Service: Gastroenterology;  Laterality: N/A;  . ESOPHAGOGASTRODUODENOSCOPY (EGD) WITH PROPOFOL N/A 09/07/2017   Procedure: ESOPHAGOGASTRODUODENOSCOPY (EGD) WITH PROPOFOL;  Surgeon: Jonathon Bellows, MD;  Location: [facility] ENDOSCOPY;  Service: Gastroenterology;  Laterality: N/A;  . IR ANGIOGRAM SELECTIVE EACH ADDITIONAL VESSEL  11/22/2016  . IR ANGIOGRAM SELECTIVE EACH ADDITIONAL VESSEL  11/22/2016  . IR ANGIOGRAM SELECTIVE EACH ADDITIONAL VESSEL  11/22/2016  . IR ANGIOGRAM SELECTIVE EACH ADDITIONAL VESSEL  12/07/2016  . IR ANGIOGRAM SELECTIVE EACH ADDITIONAL VESSEL  12/07/2016  . IR ANGIOGRAM VISCERAL SELECTIVE  11/22/2016  . IR ANGIOGRAM VISCERAL SELECTIVE  12/07/2016  . IR EMBO ARTERIAL NOT HEMORR HEMANG INC GUIDE ROADMAPPING  11/22/2016  . IR EMBO TUMOR ORGAN ISCHEMIA INFARCT INC GUIDE ROADMAPPING  12/07/2016  . IR GENERIC HISTORICAL  04/07/2016   IR RADIOLOGIST EVAL & MGMT 04/07/2016 Arne Cleveland, MD [facility] INTERV RAD  . IR RADIOLOGIST EVAL & MGMT  04/14/2017  . IR US GUIDE VASC ACCESS RIGHT  11/22/2016  . IR US GUIDE VASC ACCESS RIGHT  12/07/2016  . LAPAROSCOPIC RIGHT COLECTOMY Right 10/26/2016   Procedure:  LAPAROSCOPIC RIGHT COLECTOMY;  Surgeon: Robert Bellow, MD;  Location: [facility] ORS;  Service: General;  Laterality: Right;  . OOPHORECTOMY    . PORTACATH PLACEMENT Left 10/01/2015   Procedure: INSERTION PORT-A-CATH;  Surgeon: Robert Bellow, MD;  Location: [facility] ORS;  Service: General;  Laterality: Left;  . ROTATOR CUFF REPAIR Right 2008   right shoulder.   [place]     HEMATOLOGY/ONCOLOGY HISTORY:  Oncology History   # MARCH/APRIL 2017-NEUROENDOCRINE CA STAGE IV; [name]; [name]; [name]; [name]; [name]; [name]; [name]; July 14th  2017 CT- PR  # AUG 28th 2017- FOLFIRI +Avastin; OCT 5th CT scan- PR.   # MARCH 20th- CT STABLE liver lesions; ? Enlarging cecal lesion [CEA rising]; March 26th- FOLFOX [last avastin march 12th 2018];   # resection of primary tumor [Dr.Byrnett]; Y 89- June 19th 2018.  # July 11th 2018-FOLFOX + Avastin; SEP 2018 [CT- stable liver lesions; increasing ~76m RP LN]; RISING CEA [stopped end of Sep 2018 ]  # OCT 15th 2018- Vemu+ Iri+ Pan [mid nov stopped iri- neutropenia/dia+vemu- arthralgias]; 05/11/2017-Started enco+Bini+vectibex; April 28th PET- PROGRESSION  # May 2019- L[place]f; End of July 2019- PET Progression; Stopped LErnestine Conrad   #September 2019-January 2020-phase 1 clinical trial/Sarah Cannon/Dr.Mckean; Jan 2020 progression of disease.  #   # acute lower GIB-EGD/ colonoscopy [Dr.Anna]- - ulcerated Lesion along the anastomotic line;  biopsy negative for recurrence.    # [facility]- B-RAF V600E- MUTATED [May 24034] # right kidney lesion 2.6 x 1.6 cm ? Angiolipoma [followed by Urology in past]  ------------------------------------------------------------    DIAGNOSIS: [Jan 2017 ]-right-sided colon cancer [neuroendocrine/adeno CA; B-RAF mutated]  STAGE:  IV  ;GOALS: PALLIATIVE  CURRENT/MOST RECENT THERAPY chemotherapy break.      Malignant neoplasm of ascending colon (HCC)    ALLERGIES:  is allergic to bee venom; dilaudid [hydromorphone hcl]; and morphine and related.  MEDICATIONS:  Current Outpatient Medications  Medication Sig Dispense Refill  . ALPRAZolam  (XANAX) 0.25 MG tablet TAKE 1 TABLET BY MOUTH TWICE DAILY AS NEEDED FOR ANXIETY 60 tablet 0  . amLODipine (NORVASC) 10 MG tablet TAKE 1 TABLET BY MOUTH EVERY DAY 30 tablet 0  . Biotin w/ Vitamins C & E (HAIR/SKIN/NAILS PO) Take 1 tablet by mouth daily.    . Chlorpheniramine Maleate (CHLOR-TABLETS PO) Take 1 tablet by mouth every evening.    . diphenoxylate-atropine (LOMOTIL) 2.5-0.025 MG tablet Take 1 tablet by mouth 4 (four) times daily as needed for diarrhea or loose stools. (Patient not taking: Reported on 06/02/2018) 60 tablet 0  . Investigational - Study Medication SRobin [place] TMontanaNebraskaclinical trial    . levofloxacin (LEVAQUIN) 250 MG tablet Take 2 pills on the first day; and then once pill a day 11 tablet 0  . lidocaine-prilocaine (EMLA) cream Apply 1 application topically as needed. Apply to port a cath site 1 hour prior to chemotherapy treatments 30 g 6  . loperamide (IMODIUM A-D) 2 MG tablet Take 2 mg by mouth 4 (four) times daily as needed for diarrhea or loose stools.    . metoprolol tartrate (LOPRESSOR) 25 MG tablet TAKE ONE TABLET BY MOUTH TWICE DAILY 180 tablet 2  . mirtazapine (REMERON) 15 MG tablet Take 0.5-1 tablets (7.5-15 mg total) by mouth at  bedtime. 30 tablet 1  . ondansetron (ZOFRAN) 8 MG tablet Take 1 tablet (8 mg total) by mouth every 8 (eight) hours as needed for nausea or vomiting. (Patient not taking: Reported on 06/02/2018) 40 tablet 3  . oxybutynin (DITROPAN XL) 5 MG 24 hr tablet Take 1 tablet (5 mg total) by mouth at bedtime. 30 tablet 0  . potassium chloride (K-DUR) 10 MEQ tablet TAKE ONE TABLET BY MOUTH TWICE DAILY 180 tablet 1  . predniSONE (DELTASONE) 20 MG tablet Take 1 tablet (20 mg total) by mouth daily with breakfast. 14 tablet 0  . prochlorperazine (COMPAZINE) 10 MG tablet TAKE ONE TABLET BY MOUTH EVERY 6 HOURS AS NEEDED FOR NAUSEA OR VOMITING (Patient not taking: Reported on 06/02/2018) 30 tablet 2  . traMADol (ULTRAM) 50 MG tablet Take 1  tablet by mouth every 6 (six) hours as needed for pain.  0  . triamcinolone ointment (KENALOG) 0.5 % Apply 1 application topically 2 (two) times daily. To rash 30 g 0   No current facility-administered medications for this visit.    Facility-Administered Medications Ordered in Other Visits  Medication Dose Route Frequency Provider Last Rate Last Dose  . heparin lock flush 100 unit/mL  500 Units Intravenous Once Charlaine Dalton R, MD      . sodium chloride flush (NS) 0.9 % injection 10 mL  10 mL Intravenous PRN [name], MD   10 mL at 11/24/15 0836    VITAL SIGNS: There were no vitals taken for this visit. There were no vitals filed for this visit.  Estimated body mass index is 31.93 kg/m as calculated from the following:   Height as of 06/29/18: '5\' 4"'  (1.626 m).   Weight as of 06/29/18: 186 lb (84.4 kg).  LABS: CBC:    Component Value Date/Time   WBC 9.5 06/29/2018 0811   HGB 10.0 (L) 06/29/2018 0811   HGB 12.5 11/04/2016 1009   HCT 30.2 (L) 06/29/2018 0811   HCT 38.0 11/04/2016 1009   PLT 280 06/29/2018 0811   PLT 386 (H) 11/04/2016 1009   MCV 102.7 (H) 06/29/2018 0811   MCV 104 (H) 11/04/2016 1009   NEUTROABS 8.5 (H) 06/29/2018 0811   NEUTROABS 4.0 11/04/2016 1009   LYMPHSABS 0.3 (L) 06/29/2018 0811   LYMPHSABS 1.6 11/04/2016 1009   MONOABS 0.4 06/29/2018 0811   EOSABS 0.1 06/29/2018 0811   EOSABS 0.2 11/04/2016 1009   BASOSABS 0.1 06/29/2018 0811   BASOSABS 0.1 11/04/2016 1009   Comprehensive Metabolic Panel:    Component Value Date/Time   NA 137 06/29/2018 0811   NA 138 11/04/2016 1009   K 4.1 06/29/2018 0811   CL 107 06/29/2018 0811   CO2 17 (L) 06/29/2018 0811   BUN 29 (H) 06/29/2018 0811   BUN 14 11/04/2016 1009   CREATININE 2.12 (H) 06/29/2018 0811   GLUCOSE 124 (H) 06/29/2018 0811   CALCIUM 9.2 06/29/2018 0811   AST 39 06/29/2018 0811   ALT 23 06/29/2018 0811   ALKPHOS 242 (H) 06/29/2018 0811   BILITOT 0.9 06/29/2018 0811   BILITOT 0.3  11/04/2016 1009   PROT 7.1 06/29/2018 0811   PROT 7.9 11/04/2016 1009   ALBUMIN 2.5 (L) 06/29/2018 0811   ALBUMIN 4.2 11/04/2016 1009    RADIOGRAPHIC STUDIES: No results found.  PERFORMANCE STATUS (ECOG) : 2 - Symptomatic, <50% confined to bed  Review of Systems As noted above. Otherwise, a complete review of systems is negative.  Physical Exam General: NAD, frail appearing,  thin  Pulmonary: unlabored Extremities: no edema Skin: no rashes Neurological: Weakness but otherwise nonfocal  IMPRESSION: I met today with patient, husband, son, and daughter.  Patient says she is generally declined over the previous months with increasing fatigue and weakness.  She is still functionally independent but has little get up and go.  Fatigue is more pronounced in the morning and lessened somewhat as she progresses throughout the day.  Patient is also having some insomnia.  She initiates sleep but wakes up in the middle of the night and has difficulty resuming sleep.  She takes alprazolam at bedtime (half a 0.25 mg tablet) but this is no longer helping.  Discussed options.  Will proceed with trial of mirtazapine.  Symptomatically, other than fatigue and insomnia, patient says she is doing reasonably well.  She denies significant pain.  She has occasional nausea but no vomiting.  We discussed antiemetic regimen.  She has no concerns about her bowel pattern or frequency.  We had a long discussion about her treatment history and future goals.  Patient says she recognizes that her cancer is terminal and that she does not have any good treatment options left.  She says her primary desire is to focus on quality of life and forego any future aggressive treatments or interventions.  We talked about hospice care and she would be interested in them following her at home.  We will proceed with a hospice referral.  We discussed CODE STATUS.  Patient says she would not want to be resuscitated or have her life  prolonged artificially on machines.  Family say they agree with this decision.  She is a DNR.  She would be okay with short term hospitalization if necessary if treatment were associated with a chance at meaningful improvement. She would also be okay with antibiotics or IV fluids if needed. We talked about how all of those interventions might be associated with a futile outcome as the cancer progresses.    I completed a MOST form today. The patient and family outlined their wishes for the following treatment decisions:  Cardiopulmonary Resuscitation: Do Not Attempt Resuscitation (DNR/No CPR)  Medical Interventions: Limited Additional Interventions: Use medical treatment, IV fluids and cardiac monitoring as indicated, DO NOT USE intubation or mechanical ventilation. May consider use of less invasive airway support such as BiPAP or CPAP. Also provide comfort measures. Transfer to the hospital if indicated. Avoid intensive care.   Antibiotics: Antibiotics if indicated  IV Fluids: IV fluids if indicated  Feeding Tube: No feeding tube    PLAN: Patient not interested in further treatment of cancer DNR MOST form completed as outlined above Hospice Referral Mirtazapine 7.5-15mg qhs for insomnia Consider stimulant in AM for fatigue RTC as needed  Patient expressed understanding and was in agreement with this plan. She also understands that She can call clinic at any time with any questions, concerns, or complaints.   Time Total: 60 minutes  Visit consisted of counseling and education dealing with the complex and emotionally intense issues of symptom management and palliative care in the setting of serious and potentially life-threatening illness.Greater than 50%  of this time was spent counseling and coordinating care related to the above assessment and plan.  Signed by: Altha Harm, PhD, NP-C [phone] (Work Cell)

## 2018-07-10 ENCOUNTER — Telehealth: Payer: Self-pay | Admitting: Internal Medicine

## 2018-07-10 NOTE — Telephone Encounter (Signed)
+   Spoke to patient's daughter regarding overall poor prognosis recommendation of hospice.  In agreement.  Please keep appointment for this Friday for now.+

## 2018-07-14 ENCOUNTER — Inpatient Hospital Stay: Payer: Medicare HMO | Admitting: Internal Medicine

## 2018-07-14 ENCOUNTER — Inpatient Hospital Stay: Payer: Medicare HMO

## 2018-07-14 NOTE — Progress Notes (Unsigned)
[facility] OFFICE PROGRESS NOTE  [name], [name], MD (Internal Medicine) [name], [name], RN as Registered Nurse [name], MD as Consulting Physician (Internal Medicine)  Cancer Staging No matching staging information was found for the patient.   Oncology History   # MARCH/APRIL 2017-NEUROENDOCRINE CA STAGE IV; [name]; [name]; [name]; [name]; [name]; [name]; [name]; July 14th  2017 CT- PR  # AUG 28th 2017- FOLFIRI +Avastin; OCT 5th CT scan- PR.   # MARCH 20th- CT STABLE liver lesions; ? Enlarging cecal lesion [CEA rising]; March 26th- FOLFOX [last avastin march 12th 2018];   # resection of primary tumor [Dr.Byrnett]; Y 90- June 19th 2018.  # July 11th 2018-FOLFOX + Avastin; SEP 2018 [CT- stable liver lesions; increasing ~27mm RP LN]; RISING CEA [stopped end of Sep 2018 ]  # OCT 15th 2018- Vemu+ Iri+ Pan [mid nov stopped iri- neutropenia/dia+vemu- arthralgias]; 05/11/2017-Started enco+Bini+vectibex; April 28th PET- PROGRESSION  # May 2019- Lonsurf; End of July 2019- PET Progression; Stopped Ernestine Conrad;   #September 2019-January 2020-phase 1 clinical trial/[facility]/Dr.Mckean; Jan 2020 progression of disease.  #   # acute lower GIB-EGD/ colonoscopy [Dr.Anna]- - ulcerated Lesion along the anastomotic line;  biopsy negative for recurrence.    # [facility]- B-RAF V600E- MUTATED [May 6720]  # right kidney lesion 2.6 x 1.6 cm ? Angiolipoma [followed by Urology in past]  ------------------------------------------------------------    DIAGNOSIS: [Jan 2017 ]-right-sided colon cancer  [neuroendocrine/adeno CA; B-RAF mutated]  STAGE:  IV  ;GOALS: PALLIATIVE  CURRENT/MOST RECENT THERAPY chemotherapy break.      Malignant neoplasm of ascending colon ([place])      INTERVAL HISTORY:  Kathleen James 68 y.o.  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  Patient has been taking of clinical trial phase 1-[facility] because of progressive disease in January 2020.  Patient is currently back home.  She complains of vertigo bilateral ear discomfort.  Feels weak.  Poor appetite.  Weight loss.  4 days ago had episode of nausea vomiting diarrhea.  Currently improved.  Overall feels poorly.  Review of Systems  Constitutional: Positive for malaise/fatigue. Negative for chills, diaphoresis, fever and weight loss.  HENT: Negative for nosebleeds and sore throat.   Eyes: Negative for double vision.  Respiratory: Negative for cough, hemoptysis, sputum production, shortness of breath and wheezing.   Cardiovascular: Negative for chest pain, palpitations, orthopnea and leg swelling.  Gastrointestinal: Positive for diarrhea and nausea. Negative for blood in stool, constipation, heartburn, melena and vomiting.  Genitourinary: Negative for dysuria, frequency and urgency.  Musculoskeletal: Negative for back pain and joint pain.  Skin: Negative.  Negative for itching and rash.  Neurological: Negative for dizziness, tingling, focal weakness, weakness and headaches.  Endo/Heme/Allergies: Does not bruise/bleed easily.  Psychiatric/Behavioral: Negative for depression. The patient is not nervous/anxious and does not have insomnia.       PAST MEDICAL HISTORY :  Past Medical History:  Diagnosis Date  . Angiolipoma of kidney    followed with serial CTs ,,  Dr. Jacqlyn Larsen  . Anxiety   . Arthritis   . 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  Chemotherapy induced diarrhea   . Chemotherapy induced nausea and vomiting   . Hammer toe   . Hypertension   . Liver cancer ([place])   . Neuro-endocrine carcinoma ([place])   . Obesity (BMI 30-39.9)     PAST SURGICAL HISTORY :   Past Surgical History:  Procedure Laterality Date  . ABDOMINAL HYSTERECTOMY  1995   endometriosis, heavy bleeding  . BUNIONECTOMY Bilateral   . CESAREAN SECTION  1985  . COLONOSCOPY WITH PROPOFOL N/A 10/09/2015   Procedure: COLONOSCOPY WITH PROPOFOL;  Surgeon: Robert Bellow, MD;  Location: [facility] ENDOSCOPY;  Service: Endoscopy;  Laterality: N/A;  . COLONOSCOPY WITH PROPOFOL N/A 09/07/2017   Procedure: COLONOSCOPY WITH PROPOFOL;  Surgeon: Jonathon Bellows, MD;  Location: [facility] ENDOSCOPY;  Service: Gastroenterology;  Laterality: N/A;  . ESOPHAGOGASTRODUODENOSCOPY (EGD) WITH PROPOFOL N/A 09/07/2017   Procedure: ESOPHAGOGASTRODUODENOSCOPY (EGD) WITH PROPOFOL;  Surgeon: Jonathon Bellows, MD;  Location: [facility] ENDOSCOPY;  Service: Gastroenterology;  Laterality: N/A;  . IR ANGIOGRAM SELECTIVE EACH ADDITIONAL VESSEL  11/22/2016  . IR ANGIOGRAM SELECTIVE EACH ADDITIONAL VESSEL  11/22/2016  . IR ANGIOGRAM SELECTIVE EACH ADDITIONAL VESSEL  11/22/2016  . IR ANGIOGRAM SELECTIVE EACH ADDITIONAL VESSEL  12/07/2016  . IR ANGIOGRAM SELECTIVE EACH ADDITIONAL VESSEL  12/07/2016  . IR ANGIOGRAM VISCERAL SELECTIVE  11/22/2016  . IR ANGIOGRAM VISCERAL SELECTIVE  12/07/2016  . IR EMBO ARTERIAL NOT HEMORR HEMANG INC GUIDE ROADMAPPING  11/22/2016  . IR EMBO TUMOR ORGAN ISCHEMIA INFARCT INC GUIDE ROADMAPPING  12/07/2016  . IR GENERIC HISTORICAL  04/07/2016   IR RADIOLOGIST EVAL & MGMT 04/07/2016 Arne Cleveland, MD [facility] INTERV RAD  . IR RADIOLOGIST EVAL & MGMT  04/14/2017  . IR US GUIDE VASC ACCESS RIGHT  11/22/2016  . IR US GUIDE VASC ACCESS RIGHT  12/07/2016  . LAPAROSCOPIC RIGHT COLECTOMY Right 10/26/2016   Procedure: LAPAROSCOPIC RIGHT COLECTOMY;  Surgeon: Robert Bellow, MD;  Location: [facility] ORS;  Service:  General;  Laterality: Right;  . OOPHORECTOMY    . PORTACATH PLACEMENT Left 10/01/2015   Procedure: INSERTION PORT-A-CATH;  Surgeon: Robert Bellow, MD;  Location: [facility] ORS;  Service: General;  Laterality: Left;  . ROTATOR CUFF REPAIR Right 2008   right shoulder.  [place]     FAMILY HISTORY :   Family History  Problem Relation Age of Onset  . Mental illness Mother 37       bipolar, dementia  . Cancer Maternal Aunt 70       breast ca  . Hypertension Father   . Stroke Father 63       deceased  . COPD Father   . Heart disease Sister        deceased    SOCIAL HISTORY:   Social History   Tobacco Use  . Smoking status: Never Smoker  . Smokeless tobacco: Never Used  Substance Use Topics  . Alcohol use: Yes    Alcohol/week: 0.0 standard drinks    Comment: occasionally  . Drug use: No    ALLERGIES:  is allergic to bee venom; dilaudid [hydromorphone hcl]; and morphine and related.  MEDICATIONS:  Current Outpatient Medications  Medication Sig Dispense Refill  . ALPRAZolam (XANAX) 0.25 MG tablet TAKE 1 TABLET BY MOUTH TWICE DAILY AS NEEDED FOR ANXIETY 60 tablet 0  . amLODipine (NORVASC) 10 MG tablet TAKE 1 TABLET BY MOUTH EVERY DAY 30 tablet 0  . Biotin w/ Vitamins C &  E (HAIR/SKIN/NAILS PO) Take 1 tablet by mouth daily.    . Chlorpheniramine Maleate (CHLOR-TABLETS PO) Take 1 tablet by mouth every evening.    . diphenoxylate-atropine (LOMOTIL) 2.5-0.025 MG tablet Take 1 tablet by mouth 4 (four) times daily as needed for diarrhea or loose stools. (Patient not taking: Reported on 06/02/2018) 60 tablet 0  . Investigational - Study Medication Twin, [place] clinical trial    . levofloxacin (LEVAQUIN) 250 MG tablet Take 2 pills on the first day; and then once pill a day 11 tablet 0  . lidocaine-prilocaine (EMLA) cream Apply 1 application topically as needed. Apply to port a cath site 1 hour prior to chemotherapy treatments 30 g 6  . loperamide (IMODIUM A-D)  2 MG tablet Take 2 mg by mouth 4 (four) times daily as needed for diarrhea or loose stools.    . metoprolol tartrate (LOPRESSOR) 25 MG tablet TAKE ONE TABLET BY MOUTH TWICE DAILY 180 tablet 2  . mirtazapine (REMERON) 15 MG tablet Take 0.5-1 tablets (7.5-15 mg total) by mouth at bedtime. 30 tablet 1  . ondansetron (ZOFRAN) 8 MG tablet Take 1 tablet (8 mg total) by mouth every 8 (eight) hours as needed for nausea or vomiting. (Patient not taking: Reported on 06/02/2018) 40 tablet 3  . oxybutynin (DITROPAN XL) 5 MG 24 hr tablet Take 1 tablet (5 mg total) by mouth at bedtime. 30 tablet 0  . potassium chloride (K-DUR) 10 MEQ tablet TAKE ONE TABLET BY MOUTH TWICE DAILY 180 tablet 1  . predniSONE (DELTASONE) 20 MG tablet Take 1 tablet (20 mg total) by mouth daily with breakfast. 14 tablet 0  . prochlorperazine (COMPAZINE) 10 MG tablet TAKE ONE TABLET BY MOUTH EVERY 6 HOURS AS NEEDED FOR NAUSEA OR VOMITING (Patient not taking: Reported on 06/02/2018) 30 tablet 2  . traMADol (ULTRAM) 50 MG tablet Take 1 tablet by mouth every 6 (six) hours as needed for pain.  0  . triamcinolone ointment (KENALOG) 0.5 % Apply 1 application topically 2 (two) times daily. To rash 30 g 0   No current facility-administered medications for this visit.    Facility-Administered Medications Ordered in Other Visits  Medication Dose Route Frequency Provider Last Rate Last Dose  . heparin lock flush 100 unit/mL  500 Units Intravenous Once Charlaine Dalton R, MD      . sodium chloride flush (NS) 0.9 % injection 10 mL  10 mL Intravenous PRN [name], MD   10 mL at 11/24/15 0836    PHYSICAL EXAMINATION: ECOG PERFORMANCE STATUS: 0 - Asymptomatic  There were no vitals taken for this visit.  There were no vitals filed for this visit.  Physical Exam  Constitutional: She is oriented to person, place, and time and well-developed, well-nourished, and in no distress.  She is accompanied by husband and daughter.  She is  in a wheelchair.  HENT:  Head: Normocephalic and atraumatic.  Mouth/Throat: Oropharynx is clear and moist. No oropharyngeal exudate.  Eyes: Pupils are equal, round, and reactive to light.  Neck: Normal range of motion. Neck supple.  Cardiovascular: Normal rate and regular rhythm.  Pulmonary/Chest: Breath sounds normal. No respiratory distress. She has no wheezes.  Abdominal: Soft. Bowel sounds are normal. She exhibits no distension and no mass. There is no abdominal tenderness. There is no rebound and no guarding.  Musculoskeletal: Normal range of motion.        General: No tenderness or edema.  Neurological: She is alert and oriented  to person, place, and time.  Skin: Skin is warm.  Psychiatric: Affect normal.       LABORATORY DATA:  I have reviewed the data as listed    Component Value Date/Time   NA 137 06/29/2018 0811   NA 138 11/04/2016 1009   K 4.1 06/29/2018 0811   CL 107 06/29/2018 0811   CO2 17 (L) 06/29/2018 0811   GLUCOSE 124 (H) 06/29/2018 0811   BUN 29 (H) 06/29/2018 0811   BUN 14 11/04/2016 1009   CREATININE 2.12 (H) 06/29/2018 0811   CALCIUM 9.2 06/29/2018 0811   PROT 7.1 06/29/2018 0811   PROT 7.9 11/04/2016 1009   ALBUMIN 2.5 (L) 06/29/2018 0811   ALBUMIN 4.2 11/04/2016 1009   AST 39 06/29/2018 0811   ALT 23 06/29/2018 0811   ALKPHOS 242 (H) 06/29/2018 0811   BILITOT 0.9 06/29/2018 0811   BILITOT 0.3 11/04/2016 1009   GFRNONAA 23 (L) 06/29/2018 0811   GFRAA 27 (L) 06/29/2018 0811    No results found for: SPEP, UPEP  Lab Results  Component Value Date   WBC 9.5 06/29/2018   NEUTROABS 8.5 (H) 06/29/2018   HGB 10.0 (L) 06/29/2018   HCT 30.2 (L) 06/29/2018   MCV 102.7 (H) 06/29/2018   PLT 280 06/29/2018      Chemistry      Component Value Date/Time   NA 137 06/29/2018 0811   NA 138 11/04/2016 1009   K 4.1 06/29/2018 0811   CL 107 06/29/2018 0811   CO2 17 (L) 06/29/2018 0811   BUN 29 (H) 06/29/2018 0811   BUN 14 11/04/2016 1009    CREATININE 2.12 (H) 06/29/2018 0811      Component Value Date/Time   CALCIUM 9.2 06/29/2018 0811   ALKPHOS 242 (H) 06/29/2018 0811   AST 39 06/29/2018 0811   ALT 23 06/29/2018 0811   BILITOT 0.9 06/29/2018 0811   BILITOT 0.3 11/04/2016 1009       RADIOGRAPHIC STUDIES: I have personally reviewed the radiological images as listed and agreed with the findings in the report. No results found.   ASSESSMENT & PLAN:  No problem-specific Assessment & Plan notes found for this encounter.   No orders of the defined types were placed in this encounter.  All questions were answered. The patient knows to call the clinic with any problems, questions or concerns.      [name], MD 07/14/2018 8:17 AM

## 2018-07-14 NOTE — Assessment & Plan Note (Signed)
#  Metastatic B-RAF mutated neuroendocrine/ adeno ca of the colon.  Most recently progressed through a phase 1 clinical trial from [facility].  Imaging done at [facility].  #Unfortunately the treatment options are limited-with not good response/high risk of side effects.  Discussed option of using regorafenib; or going back to FOLFIRI chemotherapy.   #Reviewed the potential side effects including but not limited to fatigue hand-foot syndrome diarrhea the chemotherapy options.   #UTI-Klebsiella culture sensitive to levofloxacin.  Recommend renally dosed levofloxacin for total of 10 days.  # Anemia multifactorial -underlying malignancy /CKD.  Hemoglobin 9-10. STABLE.   #CKD stage III 4 creatinine is 2.2-recommend IV fluids.  #Recent nausea vomiting diarrhea-question later to recent abema/versus progressive disease/others.  Recommend fluids dexamethasone.  And reevaluate in 2 weeks if progressively improving-then reevaluate/treatment options-as discussed above.  However if patient does not improve over the next few weeks/continues to decline-hospice care might be recommended.  #DNR/DNI-previously discussed.  Patient does not want any aggressive interventions.  However recommend evaluation with palliative care/at next visit.  # IVFs+dex today x1 hour # VFs+dex next Friday x1 hour; palliative care appt-Josh # follow up in 2 weeks /fridays- MD/labs- cbc/cmp/ IVFs+dex-Dr.B   Also discussed with Dr.Meredith Mliss [facility] from [facility].  # 40 minutes face-to-face with the patient discussing the above plan of care; more than 50% of time spent on prognosis/ natural history; counseling and coordination.

## 2018-07-21 ENCOUNTER — Other Ambulatory Visit: Payer: Self-pay | Admitting: Oncology

## 2018-07-31 ENCOUNTER — Other Ambulatory Visit: Payer: Self-pay | Admitting: *Deleted

## 2018-07-31 NOTE — Telephone Encounter (Signed)
Too early for refill  

## 2018-08-03 ENCOUNTER — Encounter: Payer: Self-pay | Admitting: Internal Medicine

## 2018-08-03 ENCOUNTER — Other Ambulatory Visit: Payer: Self-pay | Admitting: Internal Medicine

## 2018-08-03 ENCOUNTER — Telehealth: Payer: Self-pay | Admitting: *Deleted

## 2018-08-03 ENCOUNTER — Telehealth: Payer: Self-pay | Admitting: Internal Medicine

## 2018-08-03 DIAGNOSIS — I872 Venous insufficiency (chronic) (peripheral): Secondary | ICD-10-CM

## 2018-08-03 DIAGNOSIS — R140 Abdominal distension (gaseous): Secondary | ICD-10-CM

## 2018-08-03 DIAGNOSIS — R109 Unspecified abdominal pain: Secondary | ICD-10-CM

## 2018-08-03 DIAGNOSIS — R180 Malignant ascites: Secondary | ICD-10-CM | POA: Insufficient documentation

## 2018-08-03 NOTE — Telephone Encounter (Signed)
Kathleen James called back and reports that she is not familiar with this patient norm, and that patient and daughter report that abdominal is twice size of nmnl. There is some give in it so it is not taught. Also reports 3 + edema on  Left leg from knee to ankle.  Discussed with Dr Rogue Bussing who ordered an [place] of abdominal and doppler of the LLE.

## 2018-08-03 NOTE — Telephone Encounter (Signed)
Hospice nurse called to report that patient is having abdominal distention and discomfort.PLease advise

## 2018-08-03 NOTE — Telephone Encounter (Signed)
I Spoke to patient's daughter, Erin-regarding her concerns about ultrasound leg.   Discussed that if patient does have a DVT that would be appropriate to treat-as patient is physically stable at this time.  However at some point of time if patient is unsteady/risk of fall will discontinue anticoagulation.  However await ultrasound of the leg at this time.  Also discussed regarding paracentesis; ordered for tomorrow.

## 2018-08-03 NOTE — Telephone Encounter (Signed)
Brenda-please check with hospice nurse if she thinks is ascites.  If it is ascites/that could be tapped which might offer some comfort of the patient. I would recommend ultrasound-guided paracentesis.  No labs needed. Thx GB

## 2018-08-03 NOTE — Telephone Encounter (Signed)
Call placed to [place] with no answer so I contacted Crystal the Triage nurse and asked her to find out if the patient looked to have ascites and that it needs to be tapped. She will contact Deidra and have her contact me

## 2018-08-03 NOTE — Telephone Encounter (Signed)
[place] ordered and message sent to scheduling to call patient with time tomorrow

## 2018-08-03 NOTE — Addendum Note (Signed)
Addended by: Betti Cruz on: 08/03/2018 04:11 PM   Modules accepted: Orders

## 2018-08-04 ENCOUNTER — Ambulatory Visit
Admission: RE | Admit: 2018-08-04 | Discharge: 2018-08-04 | Disposition: A | Discharge status: Home / Self Care | Source: Ambulatory Visit | Attending: Internal Medicine | Admitting: Internal Medicine

## 2018-08-04 ENCOUNTER — Telehealth: Payer: Self-pay | Admitting: *Deleted

## 2018-08-04 ENCOUNTER — Other Ambulatory Visit: Payer: Self-pay | Admitting: Internal Medicine

## 2018-08-04 ENCOUNTER — Telehealth: Payer: Self-pay | Admitting: Internal Medicine

## 2018-08-04 DIAGNOSIS — R109 Unspecified abdominal pain: Secondary | ICD-10-CM

## 2018-08-04 DIAGNOSIS — R140 Abdominal distension (gaseous): Secondary | ICD-10-CM | POA: Diagnosis not present

## 2018-08-04 DIAGNOSIS — C801 Malignant (primary) neoplasm, unspecified: Secondary | ICD-10-CM | POA: Diagnosis not present

## 2018-08-04 DIAGNOSIS — R180 Malignant ascites: Secondary | ICD-10-CM | POA: Insufficient documentation

## 2018-08-04 DIAGNOSIS — I872 Venous insufficiency (chronic) (peripheral): Secondary | ICD-10-CM | POA: Diagnosis not present

## 2018-08-04 DIAGNOSIS — R600 Localized edema: Secondary | ICD-10-CM | POA: Diagnosis not present

## 2018-08-04 DIAGNOSIS — R188 Other ascites: Secondary | ICD-10-CM | POA: Diagnosis not present

## 2018-08-04 NOTE — Telephone Encounter (Signed)
Mychart message sent.

## 2018-08-04 NOTE — Progress Notes (Signed)
PROCEDURE SUMMARY:  Successful US guided paracentesis from left lateral abdomen.  Yielded 450 mL of amber fluid.  No immediate complications, however patient did not tolerate procedure well.  Intermittently confused with hot flashes, nausea. Procedure stopped prior to removal of all fluid due to confusion and inability to follow commands.  BP remained stable throughout.    Specimen was not sent for labs.  EBL < 41mL  Docia Barrier PA-C 08/04/2018 11:19 AM

## 2018-08-04 NOTE — Telephone Encounter (Signed)
Hassan Rowan- Please ask hospice to run UA/and culture; and have them send the UA to [place] ASAP.Thx

## 2018-08-04 NOTE — Progress Notes (Signed)
Spoke to US/have all orders needed.

## 2018-08-04 NOTE — Telephone Encounter (Signed)
Still having confusion and reports that daughter states she has had several UTI's in recent past . Asking if you will order abxs for patient. Please advise.

## 2018-08-04 NOTE — Telephone Encounter (Signed)
Order for UA and culture called to [place] and she will tell the nurse going to see the patient this afternoon to collect and call report ASAP

## 2018-08-07 ENCOUNTER — Other Ambulatory Visit: Payer: Self-pay | Admitting: Internal Medicine

## 2018-08-07 ENCOUNTER — Telehealth: Payer: Self-pay | Admitting: *Deleted

## 2018-08-07 ENCOUNTER — Telehealth: Payer: Self-pay | Admitting: Internal Medicine

## 2018-08-07 MED ORDER — OXYCODONE HCL 5 MG PO TABS: 5.0000 mg | ORAL_TABLET | Freq: Three times a day (TID) | ORAL | 0 refills | Status: AC | PRN

## 2018-08-07 MED ORDER — CIPROFLOXACIN HCL 250 MG PO TABS: 250.0000 mg | ORAL_TABLET | Freq: Two times a day (BID) | ORAL | 0 refills | Status: AC

## 2018-08-07 NOTE — Telephone Encounter (Signed)
Kathleen James- please check with hospice RN if pt would like to try Morphine 15 mg every 6-8 hours. Please send script if in agreement. Thx

## 2018-08-07 NOTE — Telephone Encounter (Addendum)
Per Vivien Rota, MS makes patient vomit, asking about Oxycodone,please advise something else

## 2018-08-07 NOTE — Telephone Encounter (Signed)
Hospice nurse called reporting that patient is having increased pain and the Tramadol is no longer working well. Requesting change in pain medicine for this patient. Please advise

## 2018-08-07 NOTE — Telephone Encounter (Signed)
Left vm for daughter Junie Panning that cipro was sent to pharmacy.

## 2018-08-07 NOTE — Telephone Encounter (Signed)
Heather/brooke- Please inform daughter- that UA-possible infection. We can treat with cipro; while awaiting on culture. I sent a script.

## 2018-08-07 NOTE — Telephone Encounter (Signed)
I Will send script for oxycodone 5 mg every 8 hours as needed for pain.

## 2018-08-07 NOTE — Progress Notes (Signed)
[place] the script for oxycodone to [facility]- [address]

## 2018-08-08 ENCOUNTER — Telehealth: Payer: Self-pay | Admitting: *Deleted

## 2018-08-08 NOTE — Telephone Encounter (Signed)
1629 on 08/07/2018 Daughter, Junie Panning left vm with concerns about patient receiving cipro as an antibiotic due to nausea. She would like an "antibiotic injection instead." Dr. Rogue Bussing please advise.

## 2018-08-08 NOTE — Telephone Encounter (Signed)
MD spoke with daughter and agreed to continue with cipro

## 2018-08-10 ENCOUNTER — Other Ambulatory Visit: Payer: Self-pay | Admitting: *Deleted

## 2018-08-10 ENCOUNTER — Other Ambulatory Visit: Payer: Self-pay | Admitting: Nurse Practitioner

## 2018-08-10 MED ORDER — MORPHINE SULFATE (CONCENTRATE) 20 MG/ML PO SOLN: ORAL | 0 refills | Status: AC

## 2018-08-10 MED ORDER — MORPHINE SULFATE (CONCENTRATE) 20 MG/ML PO SOLN: 5.0000 mg | ORAL | 0 refills | Status: DC | PRN

## 2018-08-10 NOTE — Telephone Encounter (Addendum)
Patient declining rapidly, hospice nurse  Requesting Roxanol prescription be sent to [facility]. She does not expect patient to live through the weekend.

## 2018-08-15 ENCOUNTER — Telehealth: Payer: Self-pay | Admitting: Internal Medicine

## 2018-08-15 NOTE — Telephone Encounter (Signed)
FYI- Pt passed away on 09-06-18 per daughter.

## 2018-08-16 ENCOUNTER — Encounter: Payer: Self-pay | Admitting: Internal Medicine

## 2018-08-20 DEATH — deceased

## 2018-12-13 ENCOUNTER — Encounter: Payer: Self-pay | Admitting: Internal Medicine

## 2019-06-18 IMAGING — NM NM SPECIAL MED RAD PHYSICS CONS
1 series · 6 of 6 positions shown · non-contrast
Comparison: MAA scan 11/22/2016, CT scan 11/29/2016

CLINICAL DATA: Metastatic colorectal carcinoma. Unresectable liver
metastasis. Yttrium 90 radio embolization RIGHT hepatic lobe. First
therapy.

EXAM:
NUCLEAR MEDICINE SPECIAL MED RAD PHYSICS CONS; NUCLEAR MEDICINE
RADIO PHARM THERAPY INTRA ARTERIAL; NUCLEAR MEDICINE TREATMENT
PROCEDURE; NUCLEAR MEDICINE LIVER SCAN
TECHNIQUE: In conjunction with the interventional radiologist a Y- Microsphere
dose was calculated utilizing body surface area formulation.
Calculated dose equal 31.4 mCi. Pre therapy MAA liver SPECT scan and
CTA were evaluated. Utilizing a microcatheter system, the hepatic
artery was selected and Y-90 microspheres were delivered in
fractionated aliquots. Radiopharmaceutical was delivered by the
interventional radiologist and nuclear radiologist.
The patient tolerated procedure well. No adverse effects were noted.
Bremsstrahlung planar and SPECT imaging of the abdomen following
intrahepatic arterial delivery of Y-90 microsphere was performed.
RADIOPHARMACEUTICALS:  30.9 millicuries yttrium 90 microspheres

[Series 2: y-90 spect · 4.14mm/px · 6 of 64 frames shown]
[frame 6/64]
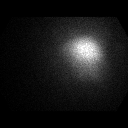
[frame 16/64]
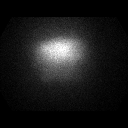
[frame 27/64]
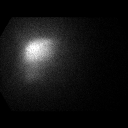
[frame 38/64]
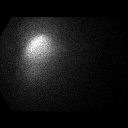
[frame 48/64]
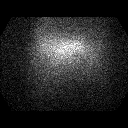
[frame 59/64]
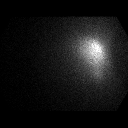

[6 of 6 positions shown; findings below may reference images not displayed]

FINDINGS: [AGE] microspheres therapy as above. First therapy the right
hepatic lobe.

Bremsstrahlung planar and SPECT imaging of the abdomen following
intrahepatic arterial delivery of 0-DBmicrosphere demonstrates
radioactivity localized to the RIGHT hepatic lobe. No evidence of
extrahepatic activity.
IMPRESSION: Successful [AGE] microsphere delivery for treatment of unresectable
liver metastasis. First therapy to the RIGHT lobe.

Bremssstrahlung scan demonstrates activity localized to RIGHT
hepatic lobe with no extrahepatic activity identified.

## 2019-06-18 IMAGING — XA IR US GUIDE VASC ACCESS RIGHT
10 series · 15 of 24 positions shown · IV contrast (IODINE)
Comparison: none

CLINICAL DATA: Metastatic MIXED ADENONEUROENDOCRINE CARCINOMA of
the colon to the liver. See previous consultation report.

EXAM:
ULTRASOUND GUIDANCE FOR VASCULAR ACCESS
SELECTIVE RIGHT HEPATIC ARTERY PERIPHERAL CATHETERIZATION,
PRETREATMENT ANGIOGRAM, [AGE] RADIOEMBOLIZATION THERAPY AND POST
EMBOLIZATION ANGIOGRAMS
TECHNIQUE: Informed consent was obtained from the patient following explanation
of the procedure, risks, benefits and alternatives.

[Series 1: care single · 1 of 1 slices shown]
[im 1/1]
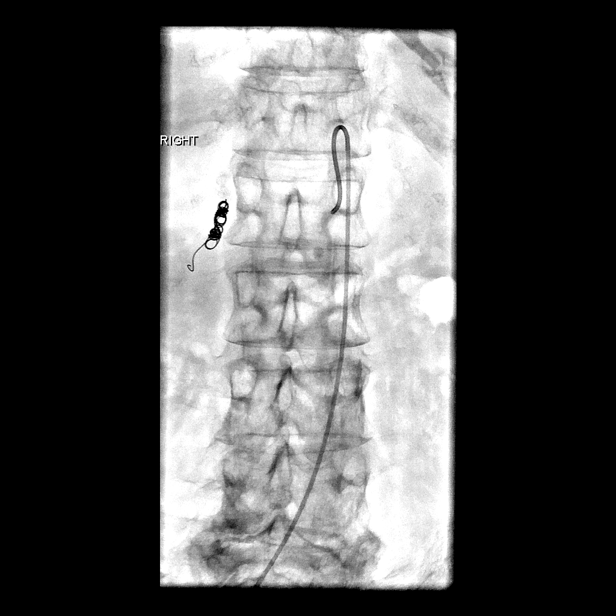

[Series 2: care body 4 · 1 of 12 frames shown (1 of 6)]
[frame 7/12]
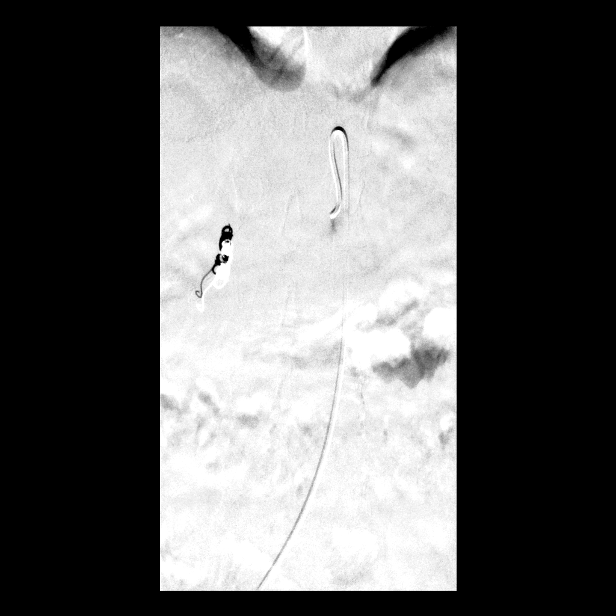

[Series 3: care body 4 · 2 of 19 frames shown (2 of 6)]
[frame 10/19]
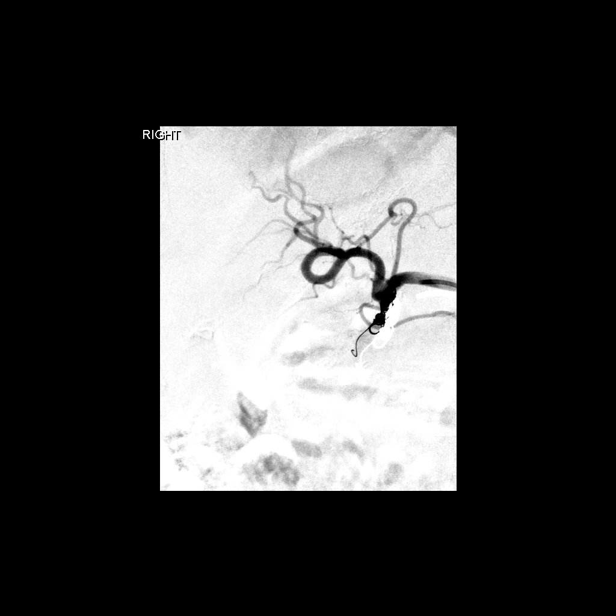
[frame 18/19]
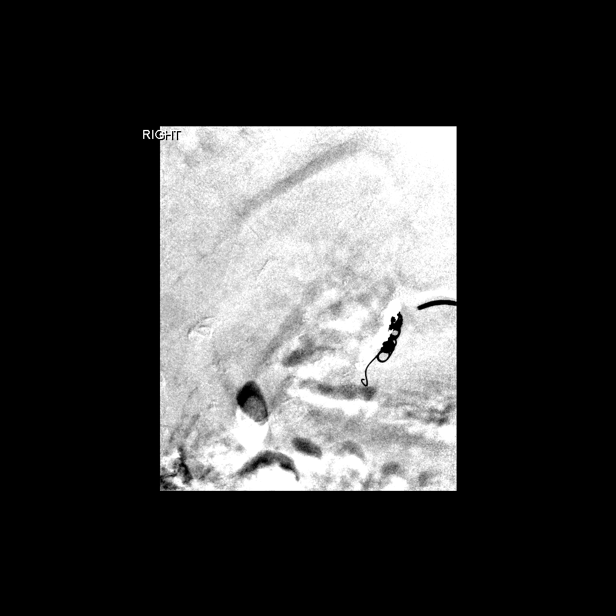

[Series 4: care body 4 · 1 of 44 frames shown (3 of 6)]
[frame 38/44]
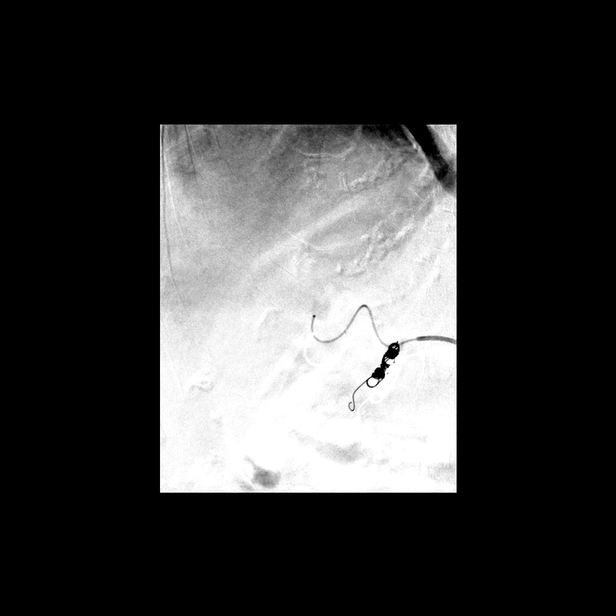

[Series 5: care body 4 · 2 of 18 frames shown (4 of 6)]
[frame 3/18]
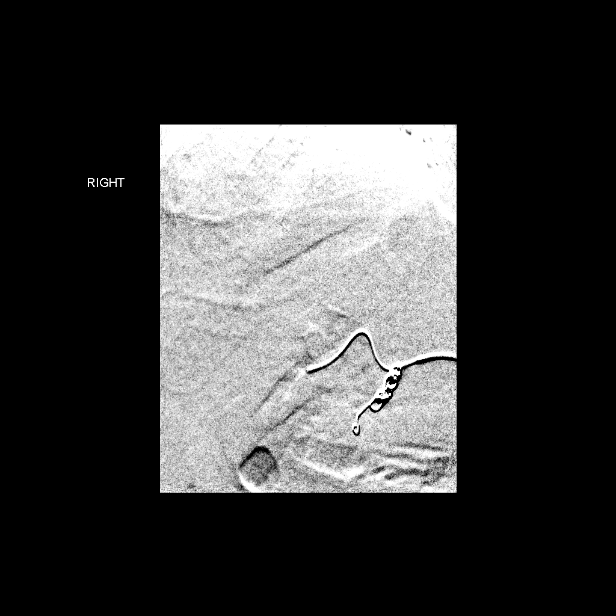
[frame 18/18]
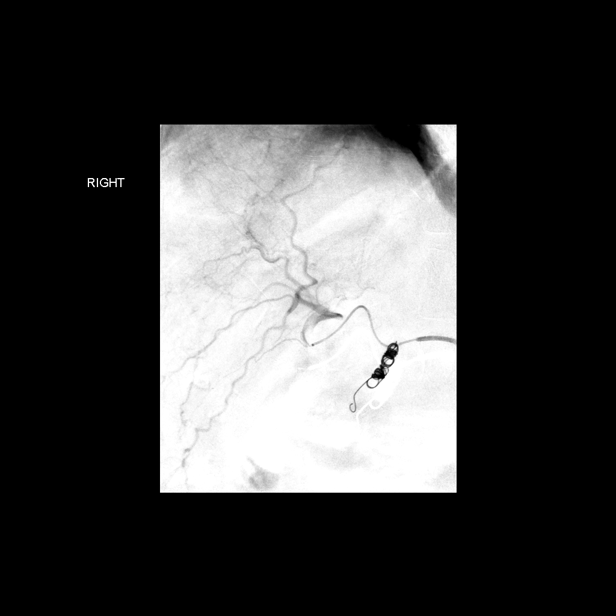

[Series 6: fl - angio · 2 of 15 frames shown (1 of 3)]
[frame 10/15]
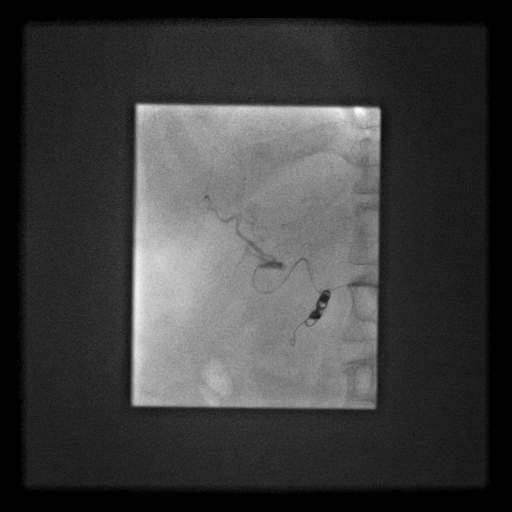
[frame 13/15]
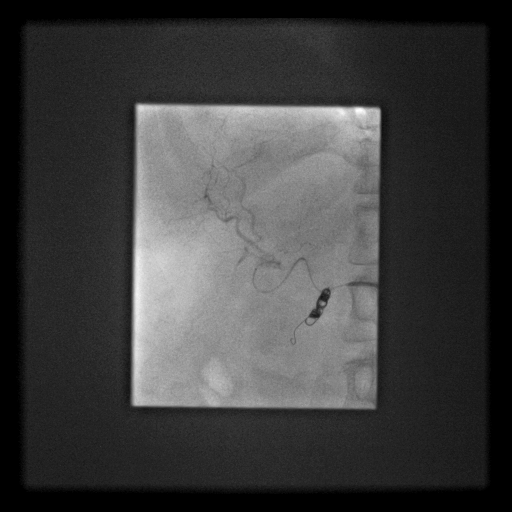

[Series 7: care body 4 · 1 of 8 frames shown (5 of 6)]
[frame 7/8]
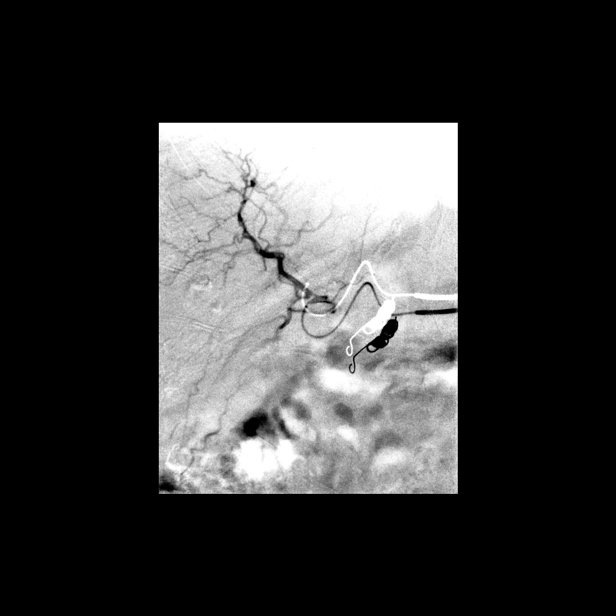

[Series 8: fl - angio · 2 of 36 frames shown (2 of 3)]
[frame 6/36]
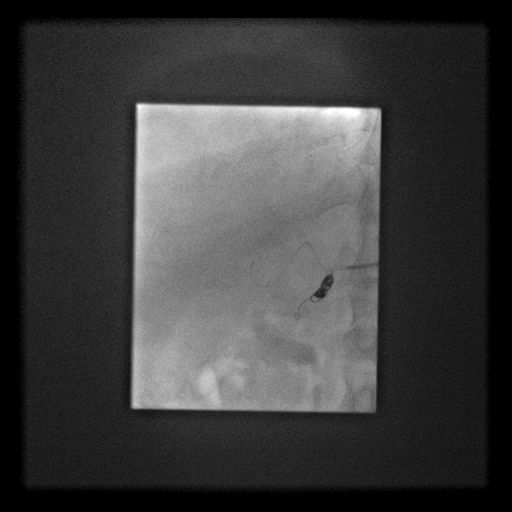
[frame 31/36]
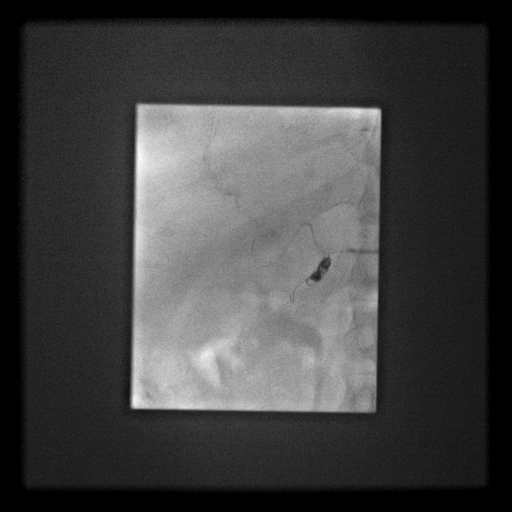

[Series 9: fl - angio · 1 of 31 frames shown (3 of 3)]
[frame 16/31]
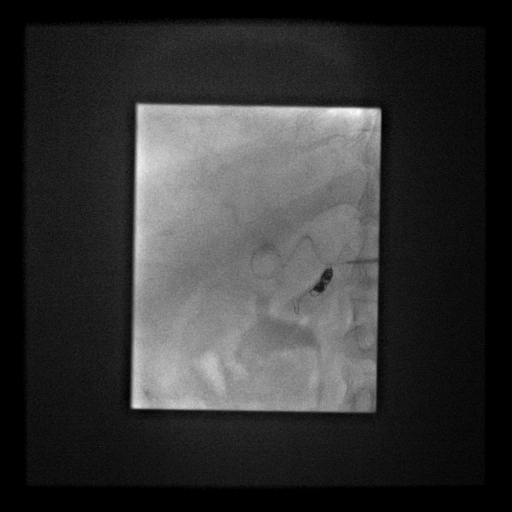

[Series 10: care body 4 · 2 of 13 frames shown (6 of 6)]
[frame 2/13]
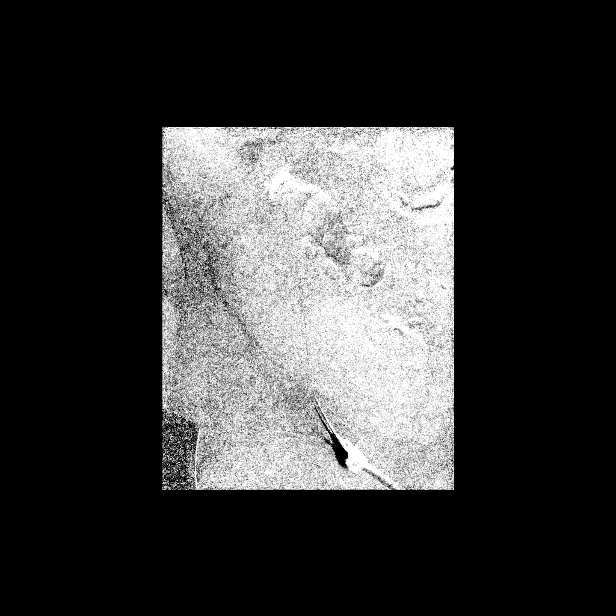
[frame 12/13]
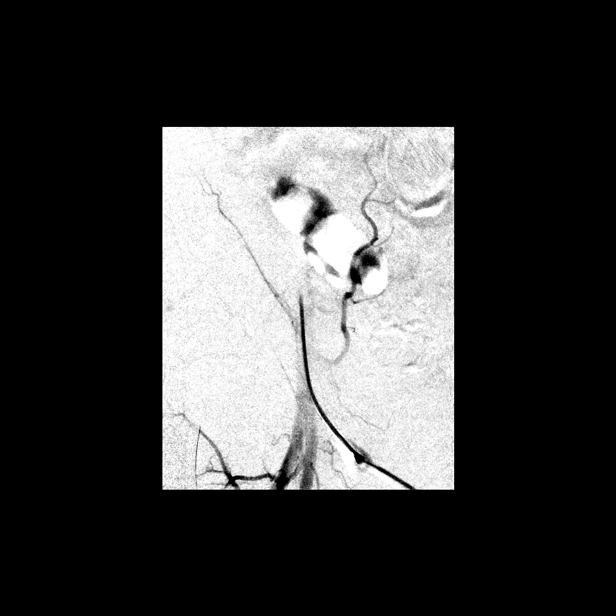

[15 of 24 positions shown; findings below may reference images not displayed]

The patient understands, agrees and consents for the procedure. All
questions were addressed. A time out was performed.

As antibiotic prophylaxis, Zosyn 3.375 g was ordered pre-procedure
and administered intravenously within one hour of incision. Maximal
barrier sterile technique utilized including caps, mask, sterile
gowns, sterile gloves, large sterile drape, hand hygiene, and
betadine.

Intravenous Fentanyl and Versed were administered as conscious
sedation during continuous monitoring of the patient's level of
consciousness and physiological / cardiorespiratory status by the
radiology RN, with a total moderate sedation time of 43 minutes.
Under sterile conditions and local anesthesia, a 21 gauge
micropuncture set ultrasound guided access performed of the right
common femoral artery.

Over a a Benson guide wire, 5-French sheath was inserted. A 5-
French Chung catheter was utilized to select the celiac origin.
Arteriogram performed. The catheter was advanced into the common
hepatic artery with the aid of an angled roadrunner hydrophilic
guidewire. Arteriogram performed, confirming durable coil occlusion
of the GDA. A coaxial high-flow micro catheter was advanced over a
0.016 guidewire into the right hepatic artery. Selective hepatic
pretreatment angiogram was performed. This confirmed good antegrade
flow into distal hepatic branches.

From this location, the [AGE] dose which had been calculated by the
nuclear medicine physician was instilled into the right hepatic
artery. Following embolization, a post embolization angiogram
confirms preserved patency and antegrade flow in the right hepatic
artery.

The micro catheter and a 5-F catheter were removed safely.

Hemostasis was obtained at the right common femoral artery access
site with a 5 French Exoseal device after confirmatory femoral
arteriogram. No immediate complication. Distal pedal pulses remained
normal. The patient tolerated the treatment well.

COMPLICATIONS:
None immediate

FLUOROSCOPY TIME:  3.9 minutes, 742 uEymX DAP
IMPRESSION: 1. Technically successful right hepatic artery [AGE]
radioembolization for hepatic adenoneuroendocrine colon carcinoma
metastases. A separate nuclear medicine report will be generated to
document dosing and post treatment Bremsstrahlung scan.
